# Patient Record
Sex: Female | Born: 1965
Health system: Southern US, Community
[De-identification: ages and names within clinical notes are randomized; demographics above are authoritative.]

## PROBLEM LIST (undated history)

## (undated) DIAGNOSIS — M549 Dorsalgia, unspecified: Secondary | ICD-10-CM

## (undated) DIAGNOSIS — F329 Major depressive disorder, single episode, unspecified: Secondary | ICD-10-CM

## (undated) DIAGNOSIS — J302 Other seasonal allergic rhinitis: Secondary | ICD-10-CM

## (undated) DIAGNOSIS — J329 Chronic sinusitis, unspecified: Secondary | ICD-10-CM

## (undated) DIAGNOSIS — Z8659 Personal history of other mental and behavioral disorders: Secondary | ICD-10-CM

## (undated) DIAGNOSIS — I10 Essential (primary) hypertension: Secondary | ICD-10-CM

## (undated) DIAGNOSIS — M542 Cervicalgia: Secondary | ICD-10-CM

## (undated) DIAGNOSIS — F32A Depression, unspecified: Secondary | ICD-10-CM

## (undated) DIAGNOSIS — F419 Anxiety disorder, unspecified: Secondary | ICD-10-CM

## (undated) DIAGNOSIS — B977 Papillomavirus as the cause of diseases classified elsewhere: Secondary | ICD-10-CM

## (undated) DIAGNOSIS — K219 Gastro-esophageal reflux disease without esophagitis: Secondary | ICD-10-CM

## (undated) HISTORY — PX: ABDOMINAL HYSTERECTOMY: SHX81

## (undated) HISTORY — DX: Other seasonal allergic rhinitis: J30.2

## (undated) HISTORY — DX: Papillomavirus as the cause of diseases classified elsewhere: B97.7

## (undated) HISTORY — PX: BREAST EXCISIONAL BIOPSY: SUR124

## (undated) HISTORY — DX: Cervicalgia: M54.2

## (undated) HISTORY — DX: Dorsalgia, unspecified: M54.9

## (undated) HISTORY — PX: SHOULDER ARTHROSCOPY: SHX128

## (undated) HISTORY — PX: ANTERIOR CRUCIATE LIGAMENT REPAIR: SHX115

## (undated) HISTORY — PX: BREAST SURGERY: SHX581

## (undated) HISTORY — DX: Personal history of other mental and behavioral disorders: Z86.59

---

## 1997-08-05 ENCOUNTER — Encounter: Admission: RE | Admit: 1997-08-05 | Discharge: 1997-11-03 | Payer: Self-pay | Admitting: Orthopedic Surgery

## 1997-11-17 ENCOUNTER — Emergency Department (HOSPITAL_COMMUNITY): Admission: EM | Admit: 1997-11-17 | Discharge: 1997-11-17 | Payer: Self-pay | Admitting: Emergency Medicine

## 1998-10-25 ENCOUNTER — Emergency Department (HOSPITAL_COMMUNITY): Admission: EM | Admit: 1998-10-25 | Discharge: 1998-10-25 | Payer: Self-pay | Admitting: Emergency Medicine

## 1998-10-25 ENCOUNTER — Encounter: Payer: Self-pay | Admitting: Emergency Medicine

## 1999-04-12 ENCOUNTER — Inpatient Hospital Stay (HOSPITAL_COMMUNITY): Admission: AD | Admit: 1999-04-12 | Discharge: 1999-04-12 | Payer: Self-pay | Admitting: *Deleted

## 1999-05-30 ENCOUNTER — Emergency Department (HOSPITAL_COMMUNITY): Admission: EM | Admit: 1999-05-30 | Discharge: 1999-05-30 | Payer: Self-pay

## 1999-07-29 ENCOUNTER — Emergency Department (HOSPITAL_COMMUNITY): Admission: EM | Admit: 1999-07-29 | Discharge: 1999-07-29 | Payer: Self-pay | Admitting: Emergency Medicine

## 2000-02-16 ENCOUNTER — Encounter (INDEPENDENT_AMBULATORY_CARE_PROVIDER_SITE_OTHER): Payer: Self-pay | Admitting: Specialist

## 2000-02-16 ENCOUNTER — Inpatient Hospital Stay (HOSPITAL_COMMUNITY): Admission: RE | Admit: 2000-02-16 | Discharge: 2000-02-19 | Payer: Self-pay | Admitting: Obstetrics and Gynecology

## 2000-02-19 ENCOUNTER — Observation Stay (HOSPITAL_COMMUNITY): Admission: AD | Admit: 2000-02-19 | Discharge: 2000-02-20 | Payer: Self-pay | Admitting: Pediatrics

## 2001-03-30 ENCOUNTER — Emergency Department (HOSPITAL_COMMUNITY): Admission: EM | Admit: 2001-03-30 | Discharge: 2001-03-30 | Payer: Self-pay | Admitting: Emergency Medicine

## 2001-11-27 ENCOUNTER — Encounter: Admission: RE | Admit: 2001-11-27 | Discharge: 2001-11-27 | Payer: Self-pay | Admitting: Internal Medicine

## 2001-11-27 ENCOUNTER — Encounter: Payer: Self-pay | Admitting: Internal Medicine

## 2001-12-25 ENCOUNTER — Encounter: Payer: Self-pay | Admitting: Emergency Medicine

## 2001-12-25 ENCOUNTER — Emergency Department (HOSPITAL_COMMUNITY): Admission: EM | Admit: 2001-12-25 | Discharge: 2001-12-25 | Payer: Self-pay | Admitting: Emergency Medicine

## 2002-01-30 ENCOUNTER — Encounter: Payer: Self-pay | Admitting: Internal Medicine

## 2002-01-30 ENCOUNTER — Encounter: Admission: RE | Admit: 2002-01-30 | Discharge: 2002-01-30 | Payer: Self-pay | Admitting: Internal Medicine

## 2002-02-05 ENCOUNTER — Encounter: Admission: RE | Admit: 2002-02-05 | Discharge: 2002-02-05 | Payer: Self-pay | Admitting: Internal Medicine

## 2002-02-05 ENCOUNTER — Encounter: Payer: Self-pay | Admitting: Internal Medicine

## 2002-03-14 ENCOUNTER — Emergency Department (HOSPITAL_COMMUNITY): Admission: EM | Admit: 2002-03-14 | Discharge: 2002-03-14 | Payer: Self-pay | Admitting: Emergency Medicine

## 2002-03-14 ENCOUNTER — Encounter: Payer: Self-pay | Admitting: Emergency Medicine

## 2002-06-02 ENCOUNTER — Encounter: Admission: RE | Admit: 2002-06-02 | Discharge: 2002-06-02 | Payer: Self-pay | Admitting: Gastroenterology

## 2002-06-02 ENCOUNTER — Encounter: Payer: Self-pay | Admitting: Gastroenterology

## 2002-06-11 ENCOUNTER — Encounter (INDEPENDENT_AMBULATORY_CARE_PROVIDER_SITE_OTHER): Payer: Self-pay | Admitting: Specialist

## 2002-06-11 ENCOUNTER — Ambulatory Visit (HOSPITAL_COMMUNITY): Admission: RE | Admit: 2002-06-11 | Discharge: 2002-06-11 | Payer: Self-pay | Admitting: Gastroenterology

## 2002-07-18 ENCOUNTER — Encounter: Payer: Self-pay | Admitting: Emergency Medicine

## 2002-07-18 ENCOUNTER — Emergency Department (HOSPITAL_COMMUNITY): Admission: EM | Admit: 2002-07-18 | Discharge: 2002-07-18 | Payer: Self-pay | Admitting: Emergency Medicine

## 2002-10-02 ENCOUNTER — Encounter: Admission: RE | Admit: 2002-10-02 | Discharge: 2002-10-02 | Payer: Self-pay | Admitting: Family Medicine

## 2003-09-28 ENCOUNTER — Encounter: Admission: RE | Admit: 2003-09-28 | Discharge: 2003-09-28 | Payer: Self-pay | Admitting: Gastroenterology

## 2003-10-21 ENCOUNTER — Emergency Department (HOSPITAL_COMMUNITY): Admission: EM | Admit: 2003-10-21 | Discharge: 2003-10-21 | Payer: Self-pay | Admitting: Emergency Medicine

## 2003-10-25 ENCOUNTER — Emergency Department (HOSPITAL_COMMUNITY): Admission: EM | Admit: 2003-10-25 | Discharge: 2003-10-25 | Payer: Self-pay | Admitting: Emergency Medicine

## 2003-12-25 ENCOUNTER — Encounter: Admission: RE | Admit: 2003-12-25 | Discharge: 2003-12-25 | Payer: Self-pay | Admitting: Sports Medicine

## 2004-02-09 ENCOUNTER — Encounter: Admission: RE | Admit: 2004-02-09 | Discharge: 2004-04-14 | Payer: Self-pay | Admitting: Orthopedic Surgery

## 2004-12-12 ENCOUNTER — Encounter: Admission: RE | Admit: 2004-12-12 | Discharge: 2004-12-12 | Payer: Self-pay | Admitting: Sports Medicine

## 2005-01-13 ENCOUNTER — Encounter: Admission: RE | Admit: 2005-01-13 | Discharge: 2005-01-13 | Payer: Self-pay | Admitting: Family Medicine

## 2005-01-25 ENCOUNTER — Encounter: Admission: RE | Admit: 2005-01-25 | Discharge: 2005-01-25 | Payer: Self-pay | Admitting: Family Medicine

## 2005-06-08 ENCOUNTER — Emergency Department (HOSPITAL_COMMUNITY): Admission: EM | Admit: 2005-06-08 | Discharge: 2005-06-08 | Payer: Self-pay | Admitting: Emergency Medicine

## 2005-07-03 ENCOUNTER — Encounter: Admission: RE | Admit: 2005-07-03 | Discharge: 2005-07-03 | Payer: Self-pay | Admitting: Family Medicine

## 2006-01-02 ENCOUNTER — Encounter: Admission: RE | Admit: 2006-01-02 | Discharge: 2006-01-02 | Payer: Self-pay | Admitting: Internal Medicine

## 2006-03-09 ENCOUNTER — Emergency Department (HOSPITAL_COMMUNITY): Admission: EM | Admit: 2006-03-09 | Discharge: 2006-03-09 | Payer: Self-pay | Admitting: Emergency Medicine

## 2006-03-18 ENCOUNTER — Emergency Department (HOSPITAL_COMMUNITY): Admission: EM | Admit: 2006-03-18 | Discharge: 2006-03-18 | Payer: Self-pay | Admitting: Emergency Medicine

## 2007-01-04 ENCOUNTER — Encounter: Admission: RE | Admit: 2007-01-04 | Discharge: 2007-01-04 | Payer: Self-pay | Admitting: Internal Medicine

## 2008-01-30 ENCOUNTER — Encounter: Admission: RE | Admit: 2008-01-30 | Discharge: 2008-01-30 | Payer: Self-pay | Admitting: Internal Medicine

## 2008-02-18 ENCOUNTER — Encounter: Admission: RE | Admit: 2008-02-18 | Discharge: 2008-02-18 | Payer: Self-pay | Admitting: Internal Medicine

## 2009-01-15 ENCOUNTER — Encounter: Admission: RE | Admit: 2009-01-15 | Discharge: 2009-01-15 | Payer: Self-pay | Admitting: Sports Medicine

## 2009-01-29 ENCOUNTER — Encounter: Admission: RE | Admit: 2009-01-29 | Discharge: 2009-01-29 | Payer: Self-pay | Admitting: Sports Medicine

## 2009-02-01 ENCOUNTER — Encounter: Admission: RE | Admit: 2009-02-01 | Discharge: 2009-02-01 | Payer: Self-pay | Admitting: Family Medicine

## 2009-02-12 ENCOUNTER — Encounter: Admission: RE | Admit: 2009-02-12 | Discharge: 2009-02-12 | Payer: Self-pay | Admitting: Sports Medicine

## 2010-08-08 ENCOUNTER — Other Ambulatory Visit: Payer: Self-pay | Admitting: Neurological Surgery

## 2010-08-08 DIAGNOSIS — M542 Cervicalgia: Secondary | ICD-10-CM

## 2010-08-12 ENCOUNTER — Ambulatory Visit
Admission: RE | Admit: 2010-08-12 | Discharge: 2010-08-12 | Disposition: A | Discharge status: Home / Self Care | Payer: Private Health Insurance - Indemnity | Source: Ambulatory Visit | Attending: Neurological Surgery | Admitting: Neurological Surgery

## 2010-08-12 DIAGNOSIS — M542 Cervicalgia: Secondary | ICD-10-CM

## 2010-08-19 NOTE — H&P (Signed)
[place]  Patient:    Catherine Pope, Catherine Pope                  MRN: [id number] Adm. Date:  [id number] Attending:  Jerald Kief                         History and Physical  HISTORY OF PRESENT ILLNESS:  Catherine Pope is a 45 year old black female status post total abdominal hysterectomy with Burch retropubic urethropexy which was performed on February 16, 2000.  She was discharged from the hospital earlier this morning.  This afternoon, she called because she was unable to urinate and could not get the catheter to work; furthermore, complained of he art beating loud and occasional chills.  Also reported a fever at home to 100.3.  In triage when she was evaluated, she was complaining of pressure and tachycardia and fever.  She had a fever of 101.3, a pulse of 97, blood pressure 131/88.  The catheter was flushed and was drained easily.  The patient stated she did not feel well and wanted to be readmitted.  The patient was placed on the floor for a 23-hour observation.  Upon placement on the floor, a CBC was ordered which showed an essentially normal white count of 10.8, hemoglobin of 11.9, and platelets of 335.  Urinalysis is pending.  On the floor of the hospital, her temperature was 99.8, pulse of 74, and her normal blood pressure.  PHYSICAL EXAMINATION:  HEART:  Regular rate and rhythm.  Lungs are clear to auscultation bilaterally.  ABDOMEN:  Nondistended, nontender.  Bowel sounds are positive.  The incision is clean, dry, and intact, and the catheter site flushes easily and appears to be draining well.  IMPRESSION AND PLAN:  Postoperative day number three, status post total abdominal hysterectomy and Burch retropubic urethropexy.  Patient reports fever at home and had fever upon presentation to triage; however, has remained afebrile and normal white count since then.  The patient reportedly is feeling much better at this time, and the  catheter is draining normally.  Will plan observation through the night since I do not have a source infection.  If she appears to do well through the night will consider discharge home in the morning.DD:  02/19/00 TD:  02/19/00 Job: [id number]

## 2010-08-19 NOTE — Discharge Summary (Signed)
[place]  Patient:    Catherine Pope, Catherine Pope                  MRN: [id number] Adm. Date:  [id number] Disc. Date: 02/17/00 Attending:  Jerald Kief                           Discharge Summary  HISTORY OF PRESENT ILLNESS:   Ms. Alloway is a 45 year old black female status post total abdominal hysterectomy and Burch retropubic urethropexy on February 16, 2000, who was discharged yesterday and returned yesterday feeling ill, tachycardia, and catheter was not working.  The patient desired readmission.  She had a fever in triage of 101.3.  HOSPITAL COURSE:              The patient was admitted to the floor, never had any significant temperature elevation during her observation status.  Complete blood count with differential was within normal limits.  Urinalysis was also normal.  The patient feels like she was relatively dehydrated and after pushing oral fluids throughout the night, the patient desired discharge home the following morning.  By the time of discharge, the patient was tolerating a regular diet, ambulating without difficulty, voiding normal amounts with no residual in the suprapubic catheter.  The staples were removed.  The suprapubic catheter was removed without difficulty, and the patient was discharged home.  DISPOSITION:                  The patient will be discharged home and will follow up in the office in 2-3 weeks.  She is sent home with a prescription for Darvocet #30. DD:  02/20/00 TD:  02/20/00 Job: [id number]

## 2010-08-19 NOTE — Discharge Summary (Signed)
[facility]  Patient:    SINA, HOSEA                  MRN: [id number] Adm. Date:  [id number] Disc. Date: 02/19/00 Attending:  Jerald Kief                           Discharge Summary  HISTORY OF PRESENT ILLNESS:   Ms. Cavitt is a 45 year old G2, P2 with worsening menomenorrhagia, pelvic pain, dyspareunia not responsive to conservative medical management and anti-inflammatory agents, oral contraceptive agents, or D&C, also with stress urinary incontinence.  Presents for TAH and birch.  See history and physical for further details.  HOSPITAL COURSE:              Patient was admitted same day of surgery.  She underwent a total abdominal hysterectomy, birch retropubic urethropexy.  The surgery was uncomplicated.  Estimated blood loss was 200 cc.  Her postoperative course was relatively uncomplicated with good return of bowel function and ambulation.  She remained afebrile throughout the hospital course.  Her postoperative hemoglobin was 11.1 on postoperative day #1 and by postoperative day #3 she was tolerating a regular diet, ambulating without difficulty, passing flatus.  Attempted bladder training was relatively successful with voids at 100-200 cc per time with minimal to no postvoid residual.  DISPOSITION:                  Patient will be discharged home with follow-up in the office in two days for removal of the staples and also for removal of the catheter.  She is sent home with a prescription for Tylox #30 and on light activity.  DISCHARGE CONDITION:          Good. DD:  02/19/00 TD:  02/19/00 Job: [id number]

## 2010-08-19 NOTE — Op Note (Signed)
NAME:  Catherine Pope, Catherine Pope                     ACCOUNT NO.:  [id number]   MEDICAL RECORD NO.:  [id number]                   PATIENT TYPE:  AMB   LOCATION:  ENDO                                 FACILITY:  [facility]   PHYSICIAN:  Nelwyn Salisbury, M.D.               DATE OF BIRTH:  01/27/1966   DATE OF PROCEDURE:  06/11/2002  DATE OF DISCHARGE:                                 OPERATIVE REPORT   PROCEDURE PERFORMED:  Colonoscopy with cold biopsy x 2.   ENDOSCOPIST:  Nelwyn Salisbury, M.D.   INSTRUMENT USED:  Olympus video colonoscope   INDICATIONS FOR PROCEDURE:  A 45 year old African-American female with a  history of rectal bleeding and abdominal pain.  Rule out colonic polyps,  masses, etc.   PREPROCEDURE PREPARATION:  Informed consent was procured from the patient  and the patient was fasted for eight hours prior to the procedure and  prepped with a bottle of magnesium citrate and a gallon of GoLYTELY the  night prior to the procedure.   PREPROCEDURE PHYSICAL:  VITAL SIGNS:  The patient had stable vital signs.  NECK:  Supple.  CHEST:  Clear to auscultation.  S1 and S2 regular.  ABDOMEN:  Soft with normal bowel sounds.   DESCRIPTION OF PROCEDURE:  The patient was placed in the left lateral  decubitus position and sedated with 100 mg of Demerol and 10 mg of Versed  intravenously.  Once the patient was adequately sedated and maintained on  low flow oxygen and continuous cardiac monitoring, the Olympus video  colonoscope was advanced from the rectum into the cecum without difficulty.  The patient had a fairly good prep.  No masses, polyps, erosions,  ulcerations or diverticula were seen.  A small sessile polyp was biopsied  from the cecum.  Small nonbleeding internal hemorrhoids were seen on  retroflexion.  The patient tolerated the procedure well without  complications.   IMPRESSION:  1. Small nonbleeding internal hemorrhoids.  2. Small sessile polyp biopsied from the cecum.  3. Normal appearing retrosigmoid, left colon, transverse colon, right colon     and terminal ileum.   RECOMMENDATIONS:  1. Await pathology results.  2.     Anusol AC 2.5% suppositories one p.o. q.h.s. This has been called in to her      pharmacy for the next month.  3. High fiber diet and liberal fluid intake.  4. Outpatient follow up in the next two weeks for further recommendations.                                               Nelwyn Salisbury, M.D.    JNM/MEDQ  D:  06/11/2002  T:  06/12/2002  Job:  [id number]   cc:   Gwenlyn Perking, M.D.  [address]  [place]  [place] [address]  Fax: [phone]

## 2010-08-19 NOTE — Op Note (Signed)
[facility]  Patient:    Catherine Pope, Catherine Pope                  MRN: [id number] Proc. Date: 02/16/00 Adm. Date:  [id number] Attending:  Jerald Kief                           Operative Report  PREOPERATIVE DIAGNOSIS:  Metromenorrhagia, probable fibroids, pelvic pain, dyspareunia and stress urinary incontinence.  POSTOPERATIVE DIAGNOSIS:  Metromenorrhagia, probable fibroids, pelvic pain, dyspareunia and stress urinary incontinence.  OPERATION:  Total abdominal hysterectomy and Burch retropubic urethropexy.  SURGEON:  Jerald Kief, M.D.  ASSISTANT:  Maisie Fus, M.D.  ANESTHESIA:  General endotracheal.  ESTIMATED BLOOD LOSS:  200 cc.  INDICATIONS:  Catherine Pope is a 45 year old G2, P2 with worsening metromenorrhagia, pelvic pain and dyspareunia not responsive to conservative medical management including anti-inflammatory medications, oral contraceptive agents.  The patient has had three D&Cs in the past for metromenorrhagia.  She also has stress urinary incontinence which is a problem for her. She desires definitive surgical intervention and presents today for total abdominal hysterectomy with Burch retropubic urethropexy.  Different types of bladder procedures were discussed with the patient at length.  The patient desires Burch retropubic urethropexy.  The risks and benefits of all the above procedures were discussed with the patient at length including but not limited to risk of infection, bleeding, damage to bowel, bladder, ureters, inability to alleviate urinary incontinence or overcorrection where the patient requires prolonged self-catheterization.  Success rates were discussed at length with the patient.  Risks of blood transfusion were discussed at length with the patient and she gives her informed consent.  OPERATIVE FINDINGS:  Approximately six to eight-week size fibroid uterus with several small fibroids,  normal-appearing tubes and ovaries.  Appendix is normal in appearance.  DESCRIPTION OF PROCEDURE:  After adequate anesthesia, the patient was placed in the frogleg position.  She was sterilely prepped and draped.  The Foley catheter was sterilely placed.  The previous Pfannenstiel skin incision was sharply excised.  The incision was taken down sharply to the fascia which was incised transversely and then superiorly ____________ belly of the rectus muscle.  A moderate amount of scar tissue was encountered.  This was sharply dissected.  Hemostasis was achieved with the Bovie cautery.  The peritoneum was entered sharply, extended superiorly and inferiorly.  The OSullivan-OConnor retractor was placed.  Bowel was packed cephalad.  Some omental adhesions were dissected off the anterior peritoneum.  Good hemostasis was achieved.  Kelly clamps were placed across the uterine ovarian ligaments bilaterally.  The round ligaments were identified bilaterally and suture ligated with a 0 Monocryl suture was used throughout the remainder of the procedure unless otherwise specified.  Windows were made in the broad ligaments and Heaney clamps were placed across the utero-ovarian ligaments bilaterally.  They were clamped, cut and tied, doubly ligated, again with 0 Monocryl suture.  The bladder was dissected off the anterior surface of the cervix.  Heaney clamps were placed at the level of the internal os across the uterine vasculature bilaterally, clamped, cut and tied.  Again the cervix was dissected off the anterior surface of cervix and then successive clamps with Heaney clamps down to cardinal ligaments down to uterosacral ligaments, clamped cut and tied until the uterosacral ligaments were encountered.  Heaney clamps were placed across the uterosacral ligaments, entering the vagina, again clamped, cut and tied at  this time.  The uterus and cervix were removed intact.  Examination revealed the entire  cervix was removed.  The vagina was then closed in a horizontal fashion with 0 Monocryl suture in figure-of-eights with good approximation and good hemostasis.  After hemostasis was assured, the uterosacral ligaments were plicated in the midline.  After a copious amount of irrigation, adequate hemostasis, the utero-ovarian pedicles were ligated to the round ligaments for suspension of the ovaries.  Re-examination of the cul-de-sac revealed good hemostasis.  At this time the peritoneum was closed with a 2-0 Vicryl and hemostasis was achieved.  Bowel packing was removed before the closure of the peritoneum.  At this time the OSullivan-OConnor retractor was replaced retracting the rectus muscles laterally.  The operators left hand was placed in the vagina pulling caudad on the Foley catheter.  The UV angle was identified and a suture of 0 Ethibond was placed in a pulley fashion through the vaginal mucosa approximately 1 cm inferior, 1 cm to the right side of the UV angle. It was then placed through the Coopers ligaments on the right side of the pubic bone. The left side of the UV angle was identified in similar fashion and 1 cm lateral and 1 cm inferior to the bladder.  A suture of 0 Ethibond was placed in a pulley fashion and ligated through the Coopers ligaments on the left side.  At this time the sutures were tied with good support of the UV angle noted.  Because of the patients size and difficulty achieving good suture placement at this time and good support noted, it was felt that only one suture was necessary on each side for good support.  After adequate hemostasis was assured, the bladlder was filled with sterile milk, no evidence of leaking was noted.  A Bonanno superpubic catheter was placed under direct visualization.  A 30 cc Foley catheter balloon was noted to pop upon insertion with good return of sterile milk and urine through the [facility] catheter.  It was suture ligated to the  skin with 0 silk.  At this time irrigation was applied.  After adequate hemostasis was assured, the fascia was closed with 0 Panacryl with two sutures of 0 Panacryl with approximation, good hemostasis.  After meticulous  hemostasis, Campers fascia was approximated with 2-0 Vicryl suture.  The skin was then stapled and Steri-Strips applied.  The patient tolerated the procedure well and was stable on transfer to recovery room.  Sponge and instrument count was normal x 3.  Estimated blood loss for this procedure was 200 cc.  The patient received 1 gm of Cefotetan preoperatively. DD:  02/16/00 TD:  02/16/00 Job: [id number]

## 2010-08-19 NOTE — H&P (Signed)
[place]  Patient:    Catherine Pope, Catherine Pope                  MRN: [id number] Adm. Date:  [id number] Attending:  Jerald Kief                         History and Physical  HISTORY OF PRESENT ILLNESS:   Ms. Fizer is a 45 year old, black female, G2, P2, who presents for surgical treatment of ongoing pelvic pain and abnormal uterine bleeding with ultrasound showing leiomyomata.  The patient also has stress urinary incontinence and urge incontinence type symptoms.  Reporting pelvic pain for the last several months, which several months ago prompted her to be elevated by [facility].  They performed an ultrasound which showed a slightly enlarged uterus, several small fibroids, and normal-appearing ovaries.  She has not responded to medication.  She does have a dull pain on a daily basis.  She also describes pressure sensation on the back of the bladder and also dyspareunia.  The dyspareunia and pelvic pressure have been there for the last number of years.  She also reports menorrhagia for the last six to seven month, very heavy.  This has been for the last six to eight years, which has not responded to oral contraceptive agents.  She has had three D&Cs in early 1994 for abnormal uterine bleeding.  She also underwent a laparoscopy in 1994 for pelvic pain and was told that everything was normal.  She describes daily urinary incontinence with stress and also some urge symptoms which have improved with Detrol.  She desires definitive surgical intervention.  She requests hysterectomy.  She also requests bladder surgery and plans on Burch retropubic urethropexy.  PAST MEDICAL HISTORY:         Negative.  PAST SURGICAL HISTORY:        She had a cesarean section in 1994 and a vaginal delivery in 1992.  She has had three D&Cs in 1994 and also a laparoscopy in 1994 for pelvic pain.  MEDICATIONS:                  Currently none other than anti-inflammatory  meds as needed.  ALLERGIES:                    She has no known drug allergies.  PHYSICAL EXAMINATION:         The blood pressure is 124/72.  WEIGHT:                       229 pounds.  HEART:                        Regular rate and rhythm.  LUNGS:                        Clear to auscultation bilaterally.  ABDOMEN:                      Nondistended and nontender.  It is obese.  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  The uterus is 6-8 weeks size.  No adnexal masses are palpable.  IMPRESSION:  1. Menometrorrhagia.                               2. Pelvic pain.                               3. Dyspareunia.                               4. Stress urinary incontinence not responsive to                                  conservative medical management.  PLAN:                         The patient was offered a myriad of treatments. Because she has not responded to conservative medical treatments and the patient has had a bilateral tubal ligation, she desires definitive surgery. She talked about different routes of surgery for bladder repair and success rates, including anterior colporrhaphy versus Burch retropubic urethropexy versus sling procedure versus no procedure at all.  The patient desires the abdominal approach and requests Burch retropubic urethropexy.  The risks and benefits of the procedure were discussed at length, including, but no limited to risks of infection, bleeding, damage to bowel, bladder, and ureters, the possibility of not able to correct urinary incontinence or overcorrection with prolonged catheter wear or self-catheterization, the risks associated with blood transfusion, including the risk of infection, such hepatitis or AIDS, and also reaction to the blood transfusion.  The patient does give her informed consent. DD:  02/16/00 TD:  02/16/00 Job: [id number]

## 2010-08-20 ENCOUNTER — Other Ambulatory Visit: Payer: Self-pay | Admitting: Neurological Surgery

## 2010-08-20 DIAGNOSIS — M542 Cervicalgia: Secondary | ICD-10-CM

## 2010-08-21 ENCOUNTER — Inpatient Hospital Stay: Admission: RE | Admit: 2010-08-21 | Payer: Private Health Insurance - Indemnity | Source: Ambulatory Visit

## 2010-08-21 ENCOUNTER — Ambulatory Visit
Admission: RE | Admit: 2010-08-21 | Discharge: 2010-08-21 | Disposition: A | Discharge status: Home / Self Care | Payer: Private Health Insurance - Indemnity | Source: Ambulatory Visit | Attending: Neurological Surgery | Admitting: Neurological Surgery

## 2010-08-21 DIAGNOSIS — M542 Cervicalgia: Secondary | ICD-10-CM

## 2010-08-22 ENCOUNTER — Other Ambulatory Visit: Payer: Self-pay | Admitting: Neurological Surgery

## 2010-08-22 DIAGNOSIS — M542 Cervicalgia: Secondary | ICD-10-CM

## 2010-09-13 ENCOUNTER — Ambulatory Visit
Admission: RE | Admit: 2010-09-13 | Discharge: 2010-09-13 | Disposition: A | Discharge status: Home / Self Care | Payer: Private Health Insurance - Indemnity | Source: Ambulatory Visit | Attending: Neurological Surgery | Admitting: Neurological Surgery

## 2010-09-13 DIAGNOSIS — M542 Cervicalgia: Secondary | ICD-10-CM

## 2010-09-20 ENCOUNTER — Other Ambulatory Visit: Payer: Self-pay | Admitting: Neurological Surgery

## 2010-09-20 DIAGNOSIS — M542 Cervicalgia: Secondary | ICD-10-CM

## 2010-09-20 DIAGNOSIS — M79601 Pain in right arm: Secondary | ICD-10-CM

## 2010-09-22 ENCOUNTER — Ambulatory Visit
Admission: RE | Admit: 2010-09-22 | Discharge: 2010-09-22 | Disposition: A | Discharge status: Home / Self Care | Payer: Private Health Insurance - Indemnity | Source: Ambulatory Visit | Attending: Neurological Surgery | Admitting: Neurological Surgery

## 2010-09-22 DIAGNOSIS — M542 Cervicalgia: Secondary | ICD-10-CM

## 2010-09-22 DIAGNOSIS — M79601 Pain in right arm: Secondary | ICD-10-CM

## 2011-03-13 ENCOUNTER — Emergency Department (HOSPITAL_COMMUNITY)
Admission: EM | Admit: 2011-03-13 | Discharge: 2011-03-13 | Disposition: A | Discharge status: Home / Self Care | Payer: Private Health Insurance - Indemnity | Attending: Emergency Medicine | Admitting: Emergency Medicine

## 2011-03-13 ENCOUNTER — Encounter: Payer: Self-pay | Admitting: Emergency Medicine

## 2011-03-13 DIAGNOSIS — I10 Essential (primary) hypertension: Secondary | ICD-10-CM | POA: Insufficient documentation

## 2011-03-13 DIAGNOSIS — E119 Type 2 diabetes mellitus without complications: Secondary | ICD-10-CM | POA: Insufficient documentation

## 2011-03-13 DIAGNOSIS — R739 Hyperglycemia, unspecified: Secondary | ICD-10-CM

## 2011-03-13 DIAGNOSIS — F17200 Nicotine dependence, unspecified, uncomplicated: Secondary | ICD-10-CM | POA: Insufficient documentation

## 2011-03-13 HISTORY — DX: Essential (primary) hypertension: I10

## 2011-03-13 LAB — URINALYSIS, ROUTINE W REFLEX MICROSCOPIC
Bilirubin Urine: NEGATIVE
Glucose, UA: >1000 mg/dL — AB
Hgb urine dipstick: NEGATIVE
Ketones, ur: >80 mg/dL — AB
Leukocytes, UA: NEGATIVE
Nitrite: NEGATIVE
Protein, ur: 30 mg/dL — AB
Specific Gravity, Urine: 1.046 — ABNORMAL HIGH (ref 1.005–1.030)
Urobilinogen, UA: 0.2 mg/dL (ref 0.0–1.0)
pH: 5.5 (ref 5.0–8.0)

## 2011-03-13 LAB — CBC
HCT: 43.4 % (ref 36.0–46.0)
Hemoglobin: 15.6 g/dL — ABNORMAL HIGH (ref 12.0–15.0)
MCH: 30.8 pg (ref 26.0–34.0)
MCHC: 35.9 g/dL (ref 30.0–36.0)
MCV: 85.6 fL (ref 78.0–100.0)
Platelets: 334 10*3/uL (ref 150–400)
RBC: 5.07 MIL/uL (ref 3.87–5.11)
RDW: 13 % (ref 11.5–15.5)
WBC: 15.7 10*3/uL — ABNORMAL HIGH (ref 4.0–10.5)

## 2011-03-13 LAB — URINE MICROSCOPIC-ADD ON

## 2011-03-13 LAB — BLOOD GAS, VENOUS
Acid-Base Excess: 0.6 mmol/L (ref 0.0–2.0)
Bicarbonate: 25.4 mEq/L — ABNORMAL HIGH (ref 20.0–24.0)
O2 Saturation: 68.3 %
Patient temperature: 98.6
TCO2: 22.1 mmol/L (ref 0–100)
pCO2, Ven: 43.3 mmHg — ABNORMAL LOW (ref 45.0–50.0)
pH, Ven: 7.387 — ABNORMAL HIGH (ref 7.250–7.300)
pO2, Ven: 38.5 mmHg (ref 30.0–45.0)

## 2011-03-13 LAB — DIFFERENTIAL
Basophils Absolute: 0 10*3/uL (ref 0.0–0.1)
Basophils Relative: 0 % (ref 0–1)
Eosinophils Absolute: 0.2 10*3/uL (ref 0.0–0.7)
Eosinophils Relative: 1 % (ref 0–5)
Lymphocytes Relative: 32 % (ref 12–46)
Lymphs Abs: 5 10*3/uL — ABNORMAL HIGH (ref 0.7–4.0)
Monocytes Absolute: 0.8 10*3/uL (ref 0.1–1.0)
Monocytes Relative: 5 % (ref 3–12)
Neutro Abs: 9.7 10*3/uL — ABNORMAL HIGH (ref 1.7–7.7)
Neutrophils Relative %: 62 % (ref 43–77)

## 2011-03-13 LAB — COMPREHENSIVE METABOLIC PANEL
ALT: 50 U/L — ABNORMAL HIGH (ref 0–35)
AST: 37 U/L (ref 0–37)
Albumin: 4.4 g/dL (ref 3.5–5.2)
Alkaline Phosphatase: 75 U/L (ref 39–117)
BUN: 9 mg/dL (ref 6–23)
CO2: 21 mEq/L (ref 19–32)
Calcium: 10.3 mg/dL (ref 8.4–10.5)
Chloride: 91 mEq/L — ABNORMAL LOW (ref 96–112)
Creatinine, Ser: 0.54 mg/dL (ref 0.50–1.10)
GFR calc Af Amer: >90 mL/min (ref >90)
GFR calc non Af Amer: >90 mL/min (ref >90)
Glucose, Bld: 397 mg/dL — ABNORMAL HIGH (ref 70–99)
Potassium: 3.9 mEq/L (ref 3.5–5.1)
Sodium: 129 mEq/L — ABNORMAL LOW (ref 135–145)
Total Bilirubin: 0.4 mg/dL (ref 0.3–1.2)
Total Protein: 8.6 g/dL — ABNORMAL HIGH (ref 6.0–8.3)

## 2011-03-13 LAB — GLUCOSE, CAPILLARY
Glucose-Capillary: 447 mg/dL — ABNORMAL HIGH (ref 70–99)
Glucose-Capillary: 354 mg/dL — ABNORMAL HIGH (ref 70–99)
Glucose-Capillary: 393 mg/dL — ABNORMAL HIGH (ref 70–99)

## 2011-03-13 MED ORDER — SODIUM CHLORIDE 0.9 % IV BOLUS (SEPSIS)
1000.0000 mL | Freq: Once | INTRAVENOUS | Status: AC
  Administered 2011-03-13: 1000 mL via INTRAVENOUS

## 2011-03-13 MED ORDER — INSULIN REGULAR HUMAN 100 UNIT/ML IJ SOLN
10.0000 [IU] | Freq: Once | INTRAMUSCULAR | Status: AC
  Administered 2011-03-13: 10 [IU] via INTRAVENOUS
  Filled 2011-03-13 (×2): qty 0.1

## 2011-03-13 NOTE — ED Notes (Signed)
Pt aware of the need for a urine sample. 

## 2011-03-13 NOTE — ED Notes (Signed)
Pt states has recently finished antibiotics for cold s/s. Pt states thurs started having blurred vision and weakness. Pt states not getting any better. Pt states did not take any medications today.

## 2011-03-13 NOTE — ED Notes (Signed)
CBG registered 354 on ED Glucometer.

## 2011-03-13 NOTE — ED Notes (Signed)
Pt care assumed, obtained verbal report.

## 2011-03-13 NOTE — ED Provider Notes (Signed)
History     CSN: [id number] Arrival date & time: 03/13/2011  8:28 AM   First MD Initiated Contact with Patient 03/13/11 [phone]      Chief Complaint  Patient presents with  . Hyperglycemia    (Consider location/radiation/quality/duration/timing/severity/associated sxs/prior treatment) HPI The patient presents with 4 days of hyper glycemia and multiple other complaints. She notes that following completion of a course of prednisone, she has had persistent hyperglycemia, with blood glucose greater than 400. Over the same timeframe she complains of intermittent blurry vision, mild imbalance, generalized fatigue. No relief with anything, patient continues to take her by mouth medications as directed. The patient denies any fever, chills, vomiting, diarrhea, dysuria, abdominal pain Past Medical History  Diagnosis Date  . Diabetes mellitus   . Hypertension     Past Surgical History  Procedure Date  . Abdominal hysterectomy   . Anterior cruciate ligament repair     History reviewed. No pertinent family history.  History  Substance Use Topics  . Smoking status: Current Everyday Smoker -- 0.5 packs/day    Types: Cigarettes  . Smokeless tobacco: Not on file  . Alcohol Use: No    OB History    Grav Para Term Preterm Abortions TAB SAB Ect Mult Living                  Review of Systems  Constitutional:       HPI  HENT:       HPI otherwise negative  Eyes: Negative.   Respiratory:       HPI, otherwise negative  Cardiovascular:       HPI, otherwise nmegative  Gastrointestinal: Negative for vomiting.  Genitourinary:       HPI, otherwise negative  Musculoskeletal:       HPI, otherwise negative  Skin: Negative.   Neurological: Negative for syncope.    Allergies  Review of patient's allergies indicates no known allergies.  Home Medications   Current Outpatient Rx  Name Route Sig Dispense Refill  . OMEGA-3 FATTY ACIDS 1000 MG PO CAPS Oral Take 2 g by mouth daily.        Marland Kitchen HYDROCHLOROTHIAZIDE 12.5 MG PO TABS Oral Take 12.5 mg by mouth daily.      Marland Kitchen METFORMIN HCL 500 MG PO TABS Oral Take 1,000 mg by mouth 2 (two) times daily with a meal. 2 tabs for 1000mg  dose    . MULTI-VITAMIN/MINERALS PO TABS Oral Take 1 tablet by mouth daily.      Marland Kitchen PSEUDOEPHEDRINE HCL 30 MG PO TABS Oral Take 30 mg by mouth every 4 (four) hours as needed. Cold symptoms       BP 149/85  Pulse 101  Temp(Src) 99 F (37.2 C) (Oral)  Resp 16  SpO2 97%  Physical Exam  Nursing note and vitals reviewed. Constitutional: She is oriented to person, place, and time. She appears well-developed and well-nourished.  HENT:  Head: Normocephalic and atraumatic.  Eyes: Conjunctivae, EOM and lids are normal. Pupils are equal, round, and reactive to light. Right conjunctiva has no hemorrhage. Left conjunctiva has no hemorrhage. Right eye exhibits normal extraocular motion and no nystagmus. Left eye exhibits normal extraocular motion and no nystagmus.  Fundoscopic exam:      The right eye shows no hemorrhage and no papilledema.       The left eye shows no hemorrhage and no papilledema.  Cardiovascular: Normal rate and regular rhythm.   Pulmonary/Chest: Effort normal and breath sounds normal.  Abdominal: She  exhibits no distension.  Musculoskeletal: She exhibits no edema and no tenderness.  Neurological: She is alert and oriented to person, place, and time.  Skin: Skin is warm and dry.  Psychiatric: She has a normal mood and affect.    ED Course  Procedures (including critical care time)  Labs Reviewed  GLUCOSE, CAPILLARY - Abnormal; Notable for the following:    Glucose-Capillary 447 (*)    All other components within normal limits  CBC - Abnormal; Notable for the following:    WBC 15.7 (*)    Hemoglobin 15.6 (*)    All other components within normal limits  DIFFERENTIAL  POCT CBG MONITORING  COMPREHENSIVE METABOLIC PANEL  URINALYSIS, ROUTINE W REFLEX MICROSCOPIC  BLOOD GAS, VENOUS    No results found.   No diagnosis found.    MDM  This is a 45 year old female presents with concerns of hyperglycemia and multiple other minor complaints. On exam the patient is in no distress with no objective findings of end organ dysfunction. The patient's blood glucose was initially greater than 400, decreased substantially following IV fluids and insulin. The patient's labs are not remarkably abnormal, and abnormalities are consistent with hyperglycemia. The patient notes that she feels significantly better following ED interventions, and will be discharged to follow up with her primary care physician within the next 3 days. Discussion was held with patient regarding utility of insulin versus continued oral anti-hypoglycemic meds. The patient's recent cessation of steroids as a possible ablation for her new hyperglycemia. Given the absence of a history of persistent hyperglycemia, or other indications for insulin this intervention will not be continued, but the patient will discuss this with her primary care physician        Carmin Muskrat, MD 03/13/11 1205

## 2011-03-13 NOTE — ED Notes (Signed)
Pt reports sinus congestion, sore throat and generally feeling weak and lethargic with blurred vision starting Thursday after completing prednisone and zithromax. Pt reports no improvement to sinus issues. Pt reports blood sugar up to 400 yesterday without changes in diabetic medications. Pt reports not on insulin, but takes oral medications.

## 2011-03-13 NOTE — ED Notes (Signed)
cbg [id number]

## 2011-07-05 ENCOUNTER — Encounter (HOSPITAL_COMMUNITY): Payer: Self-pay

## 2011-07-05 ENCOUNTER — Emergency Department (HOSPITAL_COMMUNITY)
Admission: EM | Admit: 2011-07-05 | Discharge: 2011-07-05 | Disposition: A | Discharge status: Home / Self Care | Payer: Managed Care, Other (non HMO) | Source: Home / Self Care | Attending: Emergency Medicine | Admitting: Emergency Medicine

## 2011-07-05 DIAGNOSIS — G43909 Migraine, unspecified, not intractable, without status migrainosus: Secondary | ICD-10-CM

## 2011-07-05 HISTORY — DX: Chronic sinusitis, unspecified: J32.9

## 2011-07-05 MED ORDER — IBUPROFEN 800 MG PO TABS
ORAL_TABLET | ORAL | Status: AC
  Filled 2011-07-05: qty 1

## 2011-07-05 MED ORDER — IBUPROFEN 600 MG PO TABS: 600.0000 mg | ORAL_TABLET | Freq: Four times a day (QID) | ORAL | Status: AC | PRN

## 2011-07-05 MED ORDER — IBUPROFEN 800 MG PO TABS
800.0000 mg | ORAL_TABLET | Freq: Once | ORAL | Status: AC
  Administered 2011-07-05: 800 mg via ORAL

## 2011-07-05 MED ORDER — SUMATRIPTAN SUCCINATE 100 MG PO TABS: 100.0000 mg | ORAL_TABLET | ORAL | Status: DC | PRN

## 2011-07-05 MED ORDER — DEXAMETHASONE SODIUM PHOSPHATE 10 MG/ML IJ SOLN
INTRAMUSCULAR | Status: AC
Start: 2011-07-05 — End: 2011-07-05
  Filled 2011-07-05: qty 1

## 2011-07-05 MED ORDER — DIPHENHYDRAMINE HCL 25 MG PO CAPS
ORAL_CAPSULE | ORAL | Status: AC
  Filled 2011-07-05: qty 2

## 2011-07-05 MED ORDER — SUMATRIPTAN SUCCINATE 6 MG/0.5ML ~~LOC~~ SOLN
SUBCUTANEOUS | Status: AC
  Filled 2011-07-05: qty 0.5

## 2011-07-05 MED ORDER — BUTALBITAL-APAP-CAFFEINE 50-325-40 MG PO TABS: 1.0000 | ORAL_TABLET | Freq: Four times a day (QID) | ORAL | Status: AC | PRN

## 2011-07-05 MED ORDER — DEXAMETHASONE SODIUM PHOSPHATE 10 MG/ML IJ SOLN
10.0000 mg | Freq: Once | INTRAMUSCULAR | Status: AC
  Administered 2011-07-05: 10 mg via INTRAMUSCULAR

## 2011-07-05 MED ORDER — SUMATRIPTAN SUCCINATE 6 MG/0.5ML ~~LOC~~ SOLN
6.0000 mg | Freq: Once | SUBCUTANEOUS | Status: AC
  Administered 2011-07-05: 6 mg via SUBCUTANEOUS

## 2011-07-05 MED ORDER — DIPHENHYDRAMINE HCL 25 MG PO CAPS
50.0000 mg | ORAL_CAPSULE | Freq: Once | ORAL | Status: AC
  Administered 2011-07-05: 50 mg via ORAL

## 2011-07-05 NOTE — ED Provider Notes (Signed)
History     CSN: [id number]  Arrival date & time 07/05/11  1044   First MD Initiated Contact with Patient 07/05/11 1201      Chief Complaint  Patient presents with  . Headache    (Consider location/radiation/quality/duration/timing/severity/associated sxs/prior treatment) HPI Comments: Pt c/o gradual onset throbbing right sided posterior HA and that has now moved to "frontal, behind her eyes" starting 3 days ago. Patient states HA lasts hours. Better with rest and being in a dark room. Worse with light, noise, head movement.  Reports nausea, No V, nasal congestion, sinus pain/pressure, ear pain,  purulent nasal d/c, dental pain, neck stiffness, rash, dysarthria, focal weakness, facial droop. No sudden onset, did not occur during exertion States is similar to previous migraines. Was started on an unknown anti-inflammatory by her primary care physician w/o relief. Patient also has a history sinusitis, and long history of hypertension, but states that this does not feel like this is a sinus or blood pressure problem. Patient's lisinopril was  increased to 20 mg yesterday, as her blood pressure has been running high for some time.  ROS as noted in HPI. All other ROS negative.   Patient is a 46 y.o. female presenting with migraine. The history is provided by the patient. No language interpreter was used.  Migraine This is a new problem. The current episode started more than 2 days ago. The problem occurs constantly. The problem has not changed since onset.Associated symptoms include headaches.    Past Medical History  Diagnosis Date  . Diabetes mellitus   . Hypertension   . Migraine   . Sinusitis     Past Surgical History  Procedure Date  . Abdominal hysterectomy   . Anterior cruciate ligament repair     History reviewed. No pertinent family history.  History  Substance Use Topics  . Smoking status: Current Everyday Smoker -- 0.5 packs/day    Types: Cigarettes  . Smokeless  tobacco: Not on file  . Alcohol Use: No    OB History    Grav Para Term Preterm Abortions TAB SAB Ect Mult Living                  Review of Systems  Neurological: Positive for headaches.    Allergies  Review of patient's allergies indicates no known allergies.  Home Medications   Current Outpatient Rx  Name Route Sig Dispense Refill  . FLUTICASONE PROPIONATE 50 MCG/ACT NA SUSP Nasal Place 2 sprays into the nose daily.    Marland Kitchen LISINOPRIL 20 MG PO TABS Oral Take 20 mg by mouth daily.    Marland Kitchen BUTALBITAL-APAP-CAFFEINE 50-325-40 MG PO TABS Oral Take 1-2 tablets by mouth every 6 (six) hours as needed for headache. 20 tablet 0  . OMEGA-3 FATTY ACIDS 1000 MG PO CAPS Oral Take 2 g by mouth daily.      Marland Kitchen HYDROCHLOROTHIAZIDE 12.5 MG PO TABS Oral Take 12.5 mg by mouth daily.      . IBUPROFEN 600 MG PO TABS Oral Take 1 tablet (600 mg total) by mouth every 6 (six) hours as needed for pain. 30 tablet 0  . METFORMIN HCL 500 MG PO TABS Oral Take 1,000 mg by mouth 2 (two) times daily with a meal. 2 tabs for 1000mg  dose    . MULTI-VITAMIN/MINERALS PO TABS Oral Take 1 tablet by mouth daily.      . SUMATRIPTAN SUCCINATE 100 MG PO TABS Oral Take 1 tablet (100 mg total) by mouth every 2 (two)  hours as needed for migraine. One tab at onset and may repeat x1 in 1 hour. Max 200 mg/24 hours 10 tablet 0    BP 160/84  Pulse 85  Temp(Src) 98.5 F (36.9 C) (Oral)  Physical Exam  Nursing note and vitals reviewed. Constitutional: She is oriented to person, place, and time. She appears well-developed and well-nourished. She appears distressed.       Appears uncomfortable. Photophobic. Lying down with sunglasses in a dark room  HENT:  Head: Normocephalic and atraumatic.  Right Ear: Tympanic membrane normal.  Left Ear: Tympanic membrane normal.  Nose: Nose normal. Right sinus exhibits no maxillary sinus tenderness and no frontal sinus tenderness. Left sinus exhibits no maxillary sinus tenderness and no frontal  sinus tenderness.  Mouth/Throat: Uvula is midline, oropharynx is clear and moist and mucous membranes are normal.  Eyes: Conjunctivae and EOM are normal. Pupils are equal, round, and reactive to light.  Fundoscopic exam:      The right eye shows no hemorrhage and no papilledema.       The left eye shows no hemorrhage and no papilledema.  Neck: Normal range of motion and full passive range of motion without pain. Neck supple. No Brudzinski's sign and no Kernig's sign noted.  Cardiovascular: Normal rate, regular rhythm and normal heart sounds.   Pulmonary/Chest: Effort normal and breath sounds normal.  Abdominal: Soft. Bowel sounds are normal. She exhibits no distension.  Musculoskeletal: Normal range of motion.  Lymphadenopathy:    She has no cervical adenopathy.  Neurological: She is alert and oriented to person, place, and time. She has normal strength. She displays no tremor. No cranial nerve deficit or sensory deficit. She displays a negative Romberg sign. Coordination and gait normal.       Finger->nose, heel-> shin WNL. Tandem gait steady.   Skin: Skin is warm and dry.  Psychiatric: She has a normal mood and affect. Her behavior is normal. Judgment and thought content normal.    ED Course  Procedures (including critical care time)  Labs Reviewed - No data to display No results found.   1. Migraine       MDM  Previous labs, records reviewed. Patient was seen in the ER on December 2012 for hyperglycemia after being on steroids. Blood pressure at that time of 149/85.  Pt describing typical pain, no sudden onset. Doubt SAH, ICH or space occupying lesion. Pt without fevers/chills, Pt has no meningeal sx, no nuchal rigidity. Doubt meningitis. Pt with normal neuro exam, no evidence of CVA/TIA.  Pt BP not elevated significantly, doubt hypertensive emergency. Patient also states her blood pressure has been running and "high" prior to her symptoms starting, and was started on lisinopril  20 mg yesterday. Denies chest pain, shortness of breath, abdominal pain, anuria, use of decongestants. No evidence of temporal artery tenderness, no evidence of glaucoma or other occular pathology. Will give headache cocktail (Decadron, Imitrex 6 mg [place], IBU 800 benadryl 50 mg) and reassess.  Pt much improved after medications. Pt with continued non-focal neuro exam. Discussed warning signs and symptoms and when to return to the ER. Will d/c home with nsaid, triptan, fioricet, antiemetic, and have pt F/U with PCP will also refer to Dr. Nicole Kindred, neurologist on call if headache persists.   Cherly Beach, MD 07/05/11 1416

## 2011-07-05 NOTE — ED Notes (Addendum)
3 day HA, saw her primary MD who gave her a "antiinflammatory " med; was awake every hour during night w HA pain, pain in her face; her MD told her they had nothing stronger to give her in the office and to come here; friend was observed to be with pt in the waiting area

## 2011-07-05 NOTE — Discharge Instructions (Signed)
Take the medication as written. Do not take the anti-inflammatory that your Dr. prescribed to if you decide take the ibuprofen. You may want to followup with a neurologist if this continues to be a problem. Return to the ER for the signs and symptoms we discussed, such as fevers, stiff neck, a worsening of your headache, trouble talking, trouble walking, visual changes, chest pain, if you feel like you are about to pass out, if you do pass out, or for any other concerns. This could be from your blood pressure, although this is less likely based on today's discussion and physical.

## 2011-11-24 ENCOUNTER — Other Ambulatory Visit: Payer: Self-pay | Admitting: Family Medicine

## 2011-11-24 DIAGNOSIS — R920 Mammographic microcalcification found on diagnostic imaging of breast: Secondary | ICD-10-CM

## 2011-12-01 ENCOUNTER — Ambulatory Visit
Admission: RE | Admit: 2011-12-01 | Discharge: 2011-12-01 | Disposition: A | Discharge status: Home / Self Care | Payer: Private Health Insurance - Indemnity | Source: Ambulatory Visit | Attending: Family Medicine | Admitting: Family Medicine

## 2011-12-01 DIAGNOSIS — R920 Mammographic microcalcification found on diagnostic imaging of breast: Secondary | ICD-10-CM

## 2012-01-21 ENCOUNTER — Encounter (HOSPITAL_COMMUNITY): Payer: Self-pay | Admitting: Emergency Medicine

## 2012-01-21 ENCOUNTER — Emergency Department (INDEPENDENT_AMBULATORY_CARE_PROVIDER_SITE_OTHER)
Admission: EM | Admit: 2012-01-21 | Discharge: 2012-01-21 | Disposition: A | Discharge status: Home / Self Care | Payer: Managed Care, Other (non HMO) | Source: Home / Self Care

## 2012-01-21 DIAGNOSIS — M549 Dorsalgia, unspecified: Secondary | ICD-10-CM

## 2012-01-21 DIAGNOSIS — S335XXA Sprain of ligaments of lumbar spine, initial encounter: Secondary | ICD-10-CM

## 2012-01-21 MED ORDER — TRAMADOL HCL 50 MG PO TABS: 50.0000 mg | ORAL_TABLET | Freq: Four times a day (QID) | ORAL | Status: DC | PRN

## 2012-01-21 MED ORDER — CYCLOBENZAPRINE HCL 10 MG PO TABS: 10.0000 mg | ORAL_TABLET | Freq: Two times a day (BID) | ORAL | Status: DC | PRN

## 2012-01-21 MED ORDER — KETOROLAC TROMETHAMINE 60 MG/2ML IM SOLN
INTRAMUSCULAR | Status: AC
  Filled 2012-01-21: qty 2

## 2012-01-21 MED ORDER — MELOXICAM 7.5 MG PO TABS: 7.5000 mg | ORAL_TABLET | Freq: Every day | ORAL | Status: DC

## 2012-01-21 MED ORDER — KETOROLAC TROMETHAMINE 60 MG/2ML IM SOLN
60.0000 mg | Freq: Once | INTRAMUSCULAR | Status: AC
  Administered 2012-01-21: 60 mg via INTRAMUSCULAR

## 2012-01-21 NOTE — ED Notes (Signed)
C/o back and leg pain onset Monday 01/15/2012.  No known injury.  Right mid-low back pain and into right leg, posteriorly.

## 2012-01-21 NOTE — ED Provider Notes (Signed)
Medical screening examination/treatment/procedure(s) were performed by resident physician or non-physician practitioner and as supervising physician I was immediately available for consultation/collaboration.   Pauline Good MD.    Billy Fischer, MD 01/21/12 [phone]

## 2012-01-21 NOTE — ED Provider Notes (Signed)
History     CSN: [id number]  Arrival date & time 01/21/12  1026   None     Chief Complaint  Patient presents with  . Back Pain    (Consider location/radiation/quality/duration/timing/severity/associated sxs/prior treatment) Patient is a 46 y.o. female presenting with back pain. The history is provided by the patient.  Back Pain  This is a new problem. The current episode started more than 2 days ago (6 days ago). The problem occurs daily. The problem has been gradually worsening. The pain is associated with no known injury. The pain is present in the lumbar spine. The quality of the pain is described as cramping and aching. The pain radiates to the right thigh (posterior). The pain is at a severity of 7/10. The symptoms are aggravated by bending and certain positions. The pain is worse during the night. Stiffness is present in the morning. Pertinent negatives include no numbness, no bowel incontinence, no perianal numbness, no bladder incontinence and no tingling. She has tried heat and NSAIDs (advil rapid relief) for the symptoms. The treatment provided mild relief. Risk factors include obesity and lack of exercise (desk job.).    Past Medical History  Diagnosis Date  . Diabetes mellitus   . Hypertension   . Migraine   . Sinusitis     Past Surgical History  Procedure Date  . Abdominal hysterectomy   . Anterior cruciate ligament repair     No family history on file.  History  Substance Use Topics  . Smoking status: Current Every Day Smoker -- 0.5 packs/day    Types: Cigarettes  . Smokeless tobacco: Not on file  . Alcohol Use: No    OB History    Grav Para Term Preterm Abortions TAB SAB Ect Mult Living                  Review of Systems  Constitutional: Negative.   Respiratory: Negative.   Cardiovascular: Negative.   Gastrointestinal: Negative for bowel incontinence.  Genitourinary: Negative for bladder incontinence.  Musculoskeletal: Positive for back pain.    Neurological: Negative for tingling and numbness.  All other systems reviewed and are negative.    Allergies  Review of patient's allergies indicates no known allergies.  Home Medications   Current Outpatient Rx  Name Route Sig Dispense Refill  . CYCLOBENZAPRINE HCL 10 MG PO TABS Oral Take 1 tablet (10 mg total) by mouth 2 (two) times daily as needed for muscle spasms. 20 tablet 0  . OMEGA-3 FATTY ACIDS 1000 MG PO CAPS Oral Take 2 g by mouth daily.      Marland Kitchen FLUTICASONE PROPIONATE 50 MCG/ACT NA SUSP Nasal Place 2 sprays into the nose daily.    Marland Kitchen HYDROCHLOROTHIAZIDE 12.5 MG PO TABS Oral Take 12.5 mg by mouth daily.      Marland Kitchen LISINOPRIL 20 MG PO TABS Oral Take 20 mg by mouth daily.    . MELOXICAM 7.5 MG PO TABS Oral Take 1 tablet (7.5 mg total) by mouth daily. 30 tablet 1  . METFORMIN HCL 500 MG PO TABS Oral Take 1,000 mg by mouth 2 (two) times daily with a meal. 2 tabs for 1000mg  dose    . MULTI-VITAMIN/MINERALS PO TABS Oral Take 1 tablet by mouth daily.      . SUMATRIPTAN SUCCINATE 100 MG PO TABS Oral Take 1 tablet (100 mg total) by mouth every 2 (two) hours as needed for migraine. One tab at onset and may repeat x1 in 1 hour. Max 200  mg/24 hours 10 tablet 0  . TRAMADOL HCL 50 MG PO TABS Oral Take 1 tablet (50 mg total) by mouth every 6 (six) hours as needed for pain. 15 tablet 0    BP 147/90  Pulse 98  Temp 98.4 F (36.9 C) (Oral)  Resp 18  SpO2 98%  Physical Exam  Nursing note and vitals reviewed. Constitutional: She is oriented to person, place, and time. Vital signs are normal. She appears well-developed and well-nourished. She is active and cooperative.  HENT:  Head: Normocephalic.  Eyes: Conjunctivae normal are normal. Pupils are equal, round, and reactive to light. No scleral icterus.  Neck: Trachea normal, normal range of motion and full passive range of motion without pain. Neck supple. No spinous process tenderness and no muscular tenderness present.  Cardiovascular:  Normal rate, regular rhythm, normal heart sounds, intact distal pulses and normal pulses.   Pulmonary/Chest: Effort normal and breath sounds normal.  Musculoskeletal: Normal range of motion.       Thoracic back: Normal.       Lumbar back: Normal.       Back:       Right flank tenderness, no sacroiliac tenderness   Neurological: She is alert and oriented to person, place, and time. She has normal strength and normal reflexes. No cranial nerve deficit or sensory deficit. Coordination and gait normal. GCS eye subscore is 4. GCS verbal subscore is 5. GCS motor subscore is 6.  Skin: Skin is warm and dry.  Psychiatric: She has a normal mood and affect. Her speech is normal and behavior is normal. Judgment and thought content normal. Cognition and memory are normal.    ED Course  Procedures (including critical care time)  Labs Reviewed - No data to display No results found.   1. Lumbar back sprain   2. Back pain       MDM  Medications as prescribed. Heat therapy.  Follow up with primary care physician as needed.        Awilda Metro, NP 01/21/12 1239

## 2013-03-23 DIAGNOSIS — M545 Low back pain, unspecified: Secondary | ICD-10-CM | POA: Insufficient documentation

## 2013-06-01 ENCOUNTER — Encounter (HOSPITAL_COMMUNITY): Payer: Self-pay | Admitting: Emergency Medicine

## 2013-06-01 ENCOUNTER — Emergency Department (HOSPITAL_COMMUNITY)
Admission: EM | Admit: 2013-06-01 | Discharge: 2013-06-01 | Disposition: A | Discharge status: Home / Self Care | Payer: Managed Care, Other (non HMO) | Source: Home / Self Care | Attending: Family Medicine | Admitting: Family Medicine

## 2013-06-01 DIAGNOSIS — K089 Disorder of teeth and supporting structures, unspecified: Secondary | ICD-10-CM

## 2013-06-01 DIAGNOSIS — K0889 Other specified disorders of teeth and supporting structures: Secondary | ICD-10-CM

## 2013-06-01 MED ORDER — ONDANSETRON HCL 8 MG PO TABS: 8.0000 mg | ORAL_TABLET | Freq: Three times a day (TID) | ORAL | Status: DC | PRN

## 2013-06-01 MED ORDER — MAGIC MOUTHWASH: 5.0000 mL | Freq: Four times a day (QID) | ORAL | Status: DC | PRN

## 2013-06-01 MED ORDER — ONDANSETRON 4 MG PO TBDP
ORAL_TABLET | ORAL | Status: AC
  Filled 2013-06-01: qty 2

## 2013-06-01 MED ORDER — OXYCODONE-ACETAMINOPHEN 5-325 MG PO TABS: 1.0000 | ORAL_TABLET | ORAL | Status: DC | PRN

## 2013-06-01 MED ORDER — ONDANSETRON 4 MG PO TBDP
8.0000 mg | ORAL_TABLET | Freq: Once | ORAL | Status: AC
  Administered 2013-06-01: 8 mg via ORAL

## 2013-06-01 NOTE — ED Notes (Signed)
C/O right upper toothache x 1 wk.  Does not have dentist.  Catherine Pope to PCP 2/23 - was placed on Pen VK and hydrocodone.  States has had no improvement and actually is feeling worse.  Has had some bouts of nausea and sweats.

## 2013-06-01 NOTE — Discharge Instructions (Signed)
Please call Dr. Donn Pierini office [phone] or cell [phone] [address], [place] for tooth removal $200 includes exam, Xray, and extraction and follow up visit.  Bring list of current medications with you.   Please call [facility] at [phone] to get the details to get your tooth pulled.    Dental Pain Toothache is pain in or around a tooth. It may get worse with chewing or with cold or heat.  HOME CARE  Your dentist may use a numbing medicine during treatment. If so, you may need to avoid eating until the medicine wears off. Ask your dentist about this.  Only take medicine as told by your dentist or doctor.  Avoid chewing food near the painful tooth until after all treatment is done. Ask your dentist about this. GET HELP RIGHT AWAY IF:   The problem gets worse or new problems appear.  You have a fever.  There is redness and puffiness (swelling) of the face, jaw, or neck.  You cannot open your mouth.  There is pain in the jaw.  There is very bad pain that is not helped by medicine. MAKE SURE YOU:   Understand these instructions.  Will watch your condition.  Will get help right away if you are not doing well or get worse. Document Released: 09/06/2007 Document Revised: 06/12/2011 Document Reviewed: 09/06/2007 [facility] Patient Information 2014 [place], [place].

## 2013-06-01 NOTE — ED Provider Notes (Signed)
CSN: [id number]     Arrival date & time 06/01/13  1313 History   First MD Initiated Contact with Patient 06/01/13 1425     Chief Complaint  Patient presents with  . Dental Pain   (Consider location/radiation/quality/duration/timing/severity/associated sxs/prior Treatment) HPI Patient is a 48 yo F presenting with tooth pain on upper right side. She states her tooth broke about one week ago and pain has been getting worse since then. Was evaluated by PCP who prescribed penicillin and Vicodin, which is not helping much. She has not been able to get into see a dentist. She has never had dental problems before other than having teeth pulled for other reasons. No bleeding, no pus or discharge around tooth. No fevers. She reports some nausea which started after she started the antibiotic. No vomiting.  Past Medical History  Diagnosis Date  . Diabetes mellitus   . Hypertension   . Migraine   . Sinusitis    Past Surgical History  Procedure Laterality Date  . Abdominal hysterectomy    . Anterior cruciate ligament repair     No family history on file. History  Substance Use Topics  . Smoking status: Current Every Day Smoker -- 0.50 packs/day    Types: Cigarettes  . Smokeless tobacco: Not on file  . Alcohol Use: No   OB History   Grav Para Term Preterm Abortions TAB SAB Ect Mult Living                 Review of Systems  Constitutional: Negative for fever and chills.  HENT: Positive for dental problem. Negative for congestion, drooling and mouth sores.   Eyes: Negative for visual disturbance.  Respiratory: Negative for cough and shortness of breath.   Cardiovascular: Negative for chest pain.  Gastrointestinal: Negative for abdominal pain.  Genitourinary: Negative for dysuria.  Musculoskeletal: Negative for arthralgias and myalgias.  Skin: Negative for rash.  Neurological: Negative for headaches.    Allergies  Review of patient's allergies indicates no known allergies.  Home  Medications   Current Outpatient Rx  Name  Route  Sig  Dispense  Refill  . fish oil-omega-3 fatty acids 1000 MG capsule   Oral   Take 2 g by mouth daily.           . fluticasone (FLONASE) 50 MCG/ACT nasal spray   Nasal   Place 2 sprays into the nose daily.         . hydrochlorothiazide (HYDRODIURIL) 12.5 MG tablet   Oral   Take 12.5 mg by mouth daily.           Marland Kitchen lisinopril (PRINIVIL,ZESTRIL) 20 MG tablet   Oral   Take 20 mg by mouth daily.         . metFORMIN (GLUCOPHAGE) 500 MG tablet   Oral   Take 1,000 mg by mouth 2 (two) times daily with a meal. 2 tabs for 1000mg  dose         . traMADol (ULTRAM) 50 MG tablet   Oral   Take 1 tablet (50 mg total) by mouth every 6 (six) hours as needed for pain.   15 tablet   0   . Alum & Mag Hydroxide-Simeth (MAGIC MOUTHWASH) SOLN   Oral   Take 5 mLs by mouth 4 (four) times daily as needed for mouth pain.   100 mL   0   . cyclobenzaprine (FLEXERIL) 10 MG tablet   Oral   Take 1 tablet (10 mg total) by mouth  2 (two) times daily as needed for muscle spasms.   20 tablet   0   . meloxicam (MOBIC) 7.5 MG tablet   Oral   Take 1 tablet (7.5 mg total) by mouth daily.   30 tablet   1   . Multiple Vitamins-Minerals (MULTIVITAMIN WITH MINERALS) tablet   Oral   Take 1 tablet by mouth daily.           . ondansetron (ZOFRAN) 8 MG tablet   Oral   Take 1 tablet (8 mg total) by mouth every 8 (eight) hours as needed for nausea or vomiting.   10 tablet   0   . oxyCODONE-acetaminophen (PERCOCET/ROXICET) 5-325 MG per tablet   Oral   Take 1 tablet by mouth every 4 (four) hours as needed for severe pain.   10 tablet   0   . EXPIRED: SUMAtriptan (IMITREX) 100 MG tablet   Oral   Take 1 tablet (100 mg total) by mouth every 2 (two) hours as needed for migraine. One tab at onset and may repeat x1 in 1 hour. Max 200 mg/24 hours   10 tablet   0    BP 191/107  Pulse 106  Temp(Src) 99 F (37.2 C) (Oral)  Resp 21  SpO2  99% Physical Exam  Constitutional: She is oriented to person, place, and time. She appears well-developed and well-nourished. No distress.  HENT:  Head: Normocephalic and atraumatic.  Mouth/Throat: Oropharynx is clear and moist and mucous membranes are normal. Abnormal dentition. Dental caries present. No uvula swelling.    Eyes: Conjunctivae and EOM are normal.  Neck: Normal range of motion. Neck supple.  Cardiovascular: Normal rate, regular rhythm and normal heart sounds.   Pulmonary/Chest: Effort normal and breath sounds normal. She has no wheezes.  Abdominal: Soft. She exhibits no distension. There is no tenderness.  Musculoskeletal: Normal range of motion. She exhibits no edema and no tenderness.  Lymphadenopathy:    She has no cervical adenopathy.  Neurological: She is alert and oriented to person, place, and time. Coordination normal.  Skin: Skin is warm and dry. No rash noted.  Psychiatric: She has a normal mood and affect.    ED Course  Procedures (including critical care time) Labs Review Labs Reviewed - No data to display Imaging Review No results found.  MDM   1. Pain, dental    48 yo F with dental pain. Advised that her pain will not improve until she has tooth pulled. Given resources for local dentists. - Con't Penicillin - Given Percocet #10 for pain and advised that we will not continue to give pain medication - Magic mouthwash to swish and spit - Zofran prn nausea - F/u with PCP, but strongly encouraged to save funds for tooth extraction    Montez Morita, MD 06/01/13 1507

## 2013-06-02 NOTE — ED Provider Notes (Signed)
Medical screening examination/treatment/procedure(s) were performed by a resident physician or non-physician practitioner and as the supervising physician I was immediately available for consultation/collaboration.  Lynne Leader, MD    Gregor Hams, MD 06/02/13 [phone]

## 2013-11-26 ENCOUNTER — Ambulatory Visit (INDEPENDENT_AMBULATORY_CARE_PROVIDER_SITE_OTHER): Payer: 59 | Admitting: Family Medicine

## 2013-11-26 VITALS — BP 132/88 | HR 83 | Temp 98.5°F | Resp 16 | Ht 66.0 in | Wt 239.4 lb

## 2013-11-26 DIAGNOSIS — G43009 Migraine without aura, not intractable, without status migrainosus: Secondary | ICD-10-CM | POA: Insufficient documentation

## 2013-11-26 DIAGNOSIS — R739 Hyperglycemia, unspecified: Secondary | ICD-10-CM

## 2013-11-26 DIAGNOSIS — E1165 Type 2 diabetes mellitus with hyperglycemia: Secondary | ICD-10-CM

## 2013-11-26 DIAGNOSIS — R7309 Other abnormal glucose: Secondary | ICD-10-CM

## 2013-11-26 DIAGNOSIS — E119 Type 2 diabetes mellitus without complications: Secondary | ICD-10-CM

## 2013-11-26 DIAGNOSIS — E785 Hyperlipidemia, unspecified: Secondary | ICD-10-CM | POA: Insufficient documentation

## 2013-11-26 DIAGNOSIS — E118 Type 2 diabetes mellitus with unspecified complications: Principal | ICD-10-CM

## 2013-11-26 DIAGNOSIS — G43019 Migraine without aura, intractable, without status migrainosus: Secondary | ICD-10-CM

## 2013-11-26 DIAGNOSIS — IMO0002 Reserved for concepts with insufficient information to code with codable children: Secondary | ICD-10-CM

## 2013-11-26 LAB — POCT GLYCOSYLATED HEMOGLOBIN (HGB A1C): Hemoglobin A1C: 12.1

## 2013-11-26 LAB — COMPREHENSIVE METABOLIC PANEL
ALT: 15 U/L (ref 0–35)
AST: 19 U/L (ref 0–37)
Albumin: 4.4 g/dL (ref 3.5–5.2)
Alkaline Phosphatase: 77 U/L (ref 39–117)
BUN: 6 mg/dL (ref 6–23)
CO2: 21 mEq/L (ref 19–32)
Calcium: 9.2 mg/dL (ref 8.4–10.5)
Chloride: 99 mEq/L (ref 96–112)
Creat: 0.61 mg/dL (ref 0.50–1.10)
Glucose, Bld: 352 mg/dL — ABNORMAL HIGH (ref 70–99)
Potassium: 4.1 mEq/L (ref 3.5–5.3)
Sodium: 132 mEq/L — ABNORMAL LOW (ref 135–145)
Total Bilirubin: 0.5 mg/dL (ref 0.2–1.2)
Total Protein: 7.6 g/dL (ref 6.0–8.3)

## 2013-11-26 LAB — POCT CBC
Granulocyte percent: 65.9 %G (ref 37–80)
HCT, POC: 44.8 % (ref 37.7–47.9)
Hemoglobin: 13.6 g/dL (ref 12.2–16.2)
Lymph, poc: 3.9 — AB (ref 0.6–3.4)
MCH, POC: 27 pg (ref 27–31.2)
MCHC: 30.3 g/dL — AB (ref 31.8–35.4)
MCV: 89.2 fL (ref 80–97)
MID (cbc): 0.5 (ref 0–0.9)
MPV: 9.6 fL (ref 0–99.8)
POC Granulocyte: 8.5 — AB (ref 2–6.9)
POC LYMPH PERCENT: 30.2 %L (ref 10–50)
POC MID %: 3.9 %M (ref 0–12)
Platelet Count, POC: 257 10*3/uL (ref 142–424)
RBC: 5.02 M/uL (ref 4.04–5.48)
RDW, POC: 14.3 %
WBC: 12.9 10*3/uL — AB (ref 4.6–10.2)

## 2013-11-26 LAB — GLUCOSE, POCT (MANUAL RESULT ENTRY): POC Glucose: 365 mg/dl — AB (ref 70–99)

## 2013-11-26 MED ORDER — INSULIN GLARGINE 100 UNIT/ML SOLOSTAR PEN: 10.0000 [IU] | PEN_INJECTOR | Freq: Every day | SUBCUTANEOUS | Status: DC

## 2013-11-26 MED ORDER — KETOROLAC TROMETHAMINE 30 MG/ML IJ SOLN
30.0000 mg | Freq: Once | INTRAMUSCULAR | Status: AC
  Administered 2013-11-26: 30 mg via INTRAMUSCULAR

## 2013-11-26 MED ORDER — INSULIN GLARGINE 100 UNIT/ML ~~LOC~~ SOLN: 10.0000 [IU] | Freq: Once | SUBCUTANEOUS | Status: DC

## 2013-11-26 NOTE — Progress Notes (Signed)
[facility] [address], [place] [address] [phone]  Date:  11/26/2013   Name:  Catherine Pope   DOB:  April 26, 1965   MRN:  [id number]  PCP:  No PCP Per Patient    Chief Complaint: Migraine   History of Present Illness:  Catherine Pope is a 48 y.o. very pleasant female patient who presents with the following:  Here today as a new patient with migraine headache for one week.  History of DM, HTN.  She has had migraines since she was 14.  She may have a headache on 10- 12 days a month.  They generally last 2-3 days, but this one has lasted a week.  However otherwise it is not an unusually severe HA and is not her worst HA of life.   Her PCP has her on propranolol to use as needed for headache prevention.   She has not noted any ST, fever, cough, but she does feel tired. She does get nauseated but no vomiting. She has phono and photoprobia. She does not get aura.  Except for lasting so long, this HA is typical for her.  She has been seen at the headache center in the past.  She had been taking topamax for prophylaxis but stopped about 3 months ago.   She has noted that her sugar is running "a little high." Her father died a few months ago and she stopped taking care of herself as well as she knows that she should  She did drive here today.   She took just her blood pressure meds so far today.   S/p hysterecomy  She has noted frequent urination, weight loss and thirst for a couple of months.  Her A1c had generally been around 7 but was 10 the last time she saw her PCP about 6 weeks ago. She has never been on insulin  There are no active problems to display for this patient.   Past Medical History  Diagnosis Date  . Diabetes mellitus   . Hypertension   . Migraine   . Sinusitis     Past Surgical History  Procedure Laterality Date  . Abdominal hysterectomy    . Anterior cruciate ligament repair      History  Substance Use Topics  . Smoking  status: Current Every Day Smoker -- 0.50 packs/day    Types: Cigarettes  . Smokeless tobacco: Not on file  . Alcohol Use: No    History reviewed. No pertinent family history.  No Known Allergies  Medication list has been reviewed and updated.  Current Outpatient Prescriptions on File Prior to Visit  Medication Sig Dispense Refill  . cyclobenzaprine (FLEXERIL) 10 MG tablet Take 1 tablet (10 mg total) by mouth 2 (two) times daily as needed for muscle spasms.  20 tablet  0  . fish oil-omega-3 fatty acids 1000 MG capsule Take 2 g by mouth daily.        . fluticasone (FLONASE) 50 MCG/ACT nasal spray Place 2 sprays into the nose daily.      Marland Kitchen lisinopril (PRINIVIL,ZESTRIL) 20 MG tablet Take 20 mg by mouth daily.      . metFORMIN (GLUCOPHAGE) 500 MG tablet Take 1,000 mg by mouth 2 (two) times daily with a meal. 2 tabs for 1000mg  dose      . Multiple Vitamins-Minerals (MULTIVITAMIN WITH MINERALS) tablet Take 1 tablet by mouth daily.        . traMADol (ULTRAM) 50 MG tablet Take 1 tablet (  50 mg total) by mouth every 6 (six) hours as needed for pain.  15 tablet  0  . ondansetron (ZOFRAN) 8 MG tablet Take 1 tablet (8 mg total) by mouth every 8 (eight) hours as needed for nausea or vomiting.  10 tablet  0  . SUMAtriptan (IMITREX) 100 MG tablet Take 1 tablet (100 mg total) by mouth every 2 (two) hours as needed for migraine. One tab at onset and may repeat x1 in 1 hour. Max 200 mg/24 hours  10 tablet  0   No current facility-administered medications on file prior to visit.    Review of Systems:  As per HPI- otherwise negative.   Physical Examination: Filed Vitals:   11/26/13 0856  BP: 132/88  Pulse: 83  Temp: 98.5 F (36.9 C)  Resp: 16   Filed Vitals:   11/26/13 0856  Height: 5\' 6"  (1.676 m)  Weight: 239 lb 6.4 oz (108.591 kg)   Body mass index is 38.66 kg/(m^2). Ideal Body Weight: Weight in (lb) to have BMI = 25: 154.6  GEN: WDWN, NAD, Non-toxic, A & O x 3, obese, looks  well HEENT: Atraumatic, Normocephalic. Neck supple. No masses, No LAD.  Bilateral TM wnl, oropharynx normal.  PEERL,EOMI.   Ears and Nose: No external deformity. CV: RRR, No M/G/R. No JVD. No thrill. No extra heart sounds. PULM: CTA B, no wheezes, crackles, rhonchi. No retractions. No resp. distress. No accessory muscle use. ABD: S, NT, ND, +BS. No rebound. No HSM. EXTR: No c/c/e NEURO Normal gait. Normal strength, sensation, DTR all extremities and normal facial movement.  Negative arm drift, normal balance PSYCH: Normally interactive. Conversant. Not depressed or anxious appearing.  Calm demeanor.   Results for orders placed in visit on 11/26/13  COMPREHENSIVE METABOLIC PANEL      Result Value Ref Range   Sodium 132 (*) 135 - 145 mEq/L   Potassium 4.1  3.5 - 5.3 mEq/L   Chloride 99  96 - 112 mEq/L   CO2 21  19 - 32 mEq/L   Glucose, Bld 352 (*) 70 - 99 mg/dL   BUN 6  6 - 23 mg/dL   Creat 0.61  0.50 - 1.10 mg/dL   Total Bilirubin 0.5  0.2 - 1.2 mg/dL   Alkaline Phosphatase 77  39 - 117 U/L   AST 19  0 - 37 U/L   ALT 15  0 - 35 U/L   Total Protein 7.6  6.0 - 8.3 g/dL   Albumin 4.4  3.5 - 5.2 g/dL   Calcium 9.2  8.4 - 10.5 mg/dL  POCT CBC      Result Value Ref Range   WBC 12.9 (*) 4.6 - 10.2 K/uL   Lymph, poc 3.9 (*) 0.6 - 3.4   POC LYMPH PERCENT 30.2  10 - 50 %L   MID (cbc) 0.5  0 - 0.9   POC MID % 3.9  0 - 12 %M   POC Granulocyte 8.5 (*) 2 - 6.9   Granulocyte percent 65.9  37 - 80 %G   RBC 5.02  4.04 - 5.48 M/uL   Hemoglobin 13.6  12.2 - 16.2 g/dL   HCT, POC 44.8  37.7 - 47.9 %   MCV 89.2  80 - 97 fL   MCH, POC 27.0  27 - 31.2 pg   MCHC 30.3 (*) 31.8 - 35.4 g/dL   RDW, POC 14.3     Platelet Count, POC 257  142 - 424 K/uL  MPV 9.6  0 - 99.8 fL  GLUCOSE, POCT (MANUAL RESULT ENTRY)      Result Value Ref Range   POC Glucose 365 (*) 70 - 99 mg/dl  POCT GLYCOSYLATED HEMOGLOBIN (HGB A1C)      Result Value Ref Range   Hemoglobin A1C 12.1      GIven toradol 30mg  in  clinic.    Assessment and Plan: Type II or unspecified type diabetes mellitus with unspecified complication, uncontrolled - Plan: POCT CBC, POCT glucose (manual entry), POCT glycosylated hemoglobin (Hb A1C), insulin glargine (LANTUS) injection 10 Units, Comprehensive metabolic panel, Insulin Glargine (LANTUS SOLOSTAR) 100 UNIT/ML Solostar Pen, CANCELED: Comprehensive metabolic panel  Intractable migraine without aura and without status migrainosus - Plan: ketorolac (TORADOL) 30 MG/ML injection 30 mg  Hyperglycemia  Suspect HA is due to uncontrolled DM and dehydration.  She declined IVF as she is able to take PO.  We gave her 30mg  of toradol.  She went to the drug store and returned with lantus; gave her 10 units of her own supply, taught use of pen.  Received her stat CMP- anion gap is 12, corrected na is 136.  Called to discuss with her; her diabetes currently requires insulin, but we hope this will not always be the case.  She will increase her lantus by 2 units every 2 days as long as her FBS is more than 150.  Plan recheck in 2 weeks.  Sooner if she is not feeling better.  Encouraged hydration.    Signed Lamar Blinks, MD

## 2013-11-26 NOTE — Patient Instructions (Addendum)
I will call you later today with the rest of your labs and we will make our plan.  Please go pick up your insulin pen and I will give you your first dose.  This way you will have enough to use at home for the next few days.

## 2013-11-27 ENCOUNTER — Telehealth: Payer: Self-pay | Admitting: Family Medicine

## 2013-11-27 NOTE — Telephone Encounter (Signed)
Called to check on her- her glucose this am was 295.  She is feeling ok.  She will come and see me in 2-3 weeks

## 2013-12-11 ENCOUNTER — Ambulatory Visit (INDEPENDENT_AMBULATORY_CARE_PROVIDER_SITE_OTHER): Payer: 59 | Admitting: Family Medicine

## 2013-12-11 VITALS — BP 138/80 | HR 86 | Temp 98.0°F | Resp 20 | Ht 66.0 in | Wt 243.1 lb

## 2013-12-11 DIAGNOSIS — J309 Allergic rhinitis, unspecified: Secondary | ICD-10-CM

## 2013-12-11 DIAGNOSIS — G43809 Other migraine, not intractable, without status migrainosus: Secondary | ICD-10-CM

## 2013-12-11 MED ORDER — HYDROCODONE-ACETAMINOPHEN 5-325 MG PO TABS: 1.0000 | ORAL_TABLET | Freq: Four times a day (QID) | ORAL | Status: DC | PRN

## 2013-12-11 MED ORDER — ONDANSETRON HCL 4 MG PO TABS: 4.0000 mg | ORAL_TABLET | Freq: Three times a day (TID) | ORAL | Status: DC | PRN

## 2013-12-11 NOTE — Patient Instructions (Signed)
Restart allergy nasal spray - every day.   We will refer you to other neurologist to discuss these headaches. Zofran if needed for nausea.  Hydrocodone only if needed short term.  Return to the clinic or go to the nearest emergency room if any of your symptoms worsen or new symptoms occur. Keep follow up with Dr. Lorelei Pont.   Migraine Headache A migraine headache is an intense, throbbing pain on one or both sides of your head. A migraine can last for 30 minutes to several hours. CAUSES  The exact cause of a migraine headache is not always known. However, a migraine may be caused when nerves in the brain become irritated and release chemicals that cause inflammation. This causes pain. Certain things may also trigger migraines, such as:  Alcohol.  Smoking.  Stress.  Menstruation.  Aged cheeses.  Foods or drinks that contain nitrates, glutamate, aspartame, or tyramine.  Lack of sleep.  Chocolate.  Caffeine.  Hunger.  Physical exertion.  Fatigue.  Medicines used to treat chest pain (nitroglycerine), birth control pills, estrogen, and some blood pressure medicines. SIGNS AND SYMPTOMS  Pain on one or both sides of your head.  Pulsating or throbbing pain.  Severe pain that prevents daily activities.  Pain that is aggravated by any physical activity.  Nausea, vomiting, or both.  Dizziness.  Pain with exposure to bright lights, loud noises, or activity.  General sensitivity to bright lights, loud noises, or smells. Before you get a migraine, you may get warning signs that a migraine is coming (aura). An aura may include:  Seeing flashing lights.  Seeing bright spots, halos, or zigzag lines.  Having tunnel vision or blurred vision.  Having feelings of numbness or tingling.  Having trouble talking.  Having muscle weakness. DIAGNOSIS  A migraine headache is often diagnosed based on:  Symptoms.  Physical exam.  A CT scan or MRI of your head. These imaging  tests cannot diagnose migraines, but they can help rule out other causes of headaches. TREATMENT Medicines may be given for pain and nausea. Medicines can also be given to help prevent recurrent migraines.  HOME CARE INSTRUCTIONS  Only take over-the-counter or prescription medicines for pain or discomfort as directed by your health care provider. The use of long-term narcotics is not recommended.  Lie down in a dark, quiet room when you have a migraine.  Keep a journal to find out what may trigger your migraine headaches. For example, write down:  What you eat and drink.  How much sleep you get.  Any change to your diet or medicines.  Limit alcohol consumption.  Quit smoking if you smoke.  Get 7-9 hours of sleep, or as recommended by your health care provider.  Limit stress.  Keep lights dim if bright lights bother you and make your migraines worse. SEEK IMMEDIATE MEDICAL CARE IF:   Your migraine becomes severe.  You have a fever.  You have a stiff neck.  You have vision loss.  You have muscular weakness or loss of muscle control.  You start losing your balance or have trouble walking.  You feel faint or pass out.  You have severe symptoms that are different from your first symptoms. MAKE SURE YOU:   Understand these instructions.  Will watch your condition.  Will get help right away if you are not doing well or get worse. Document Released: 03/20/2005 Document Revised: 08/04/2013 Document Reviewed: 11/25/2012 [facility] Patient Information 2015 [place], [place]. This information is not intended to replace  advice given to you by your health care provider. Make sure you discuss any questions you have with your health care provider.  

## 2013-12-11 NOTE — Progress Notes (Signed)
Subjective:    Patient ID: Catherine Pope, female    DOB: 22-Nov-1965, 48 y.o.   MRN: [id number] This chart was scribed for Wendie Agreste, MD by Martinique Peace, ED Scribe. The patient was seen in [facility]. The patient's care was started at 9:13 PM.  HPI HPI Comments: Catherine Pope is a 48 y.o. female who presents to the Emergency Department complaining of severe headache specifically to frontal aspect of head with associated irritability and increased sensitivity to light and smell. Pt has history of migraines dating back to when she was 48 years old. She reports this headache is similar to previous occurrences she has had before. Pt states that her neurologist is Dr. Domingo Cocking and she saw him most recently earlier this year. Pt states that he put her on Topamax initially and even increased the dosages after not feeling any relief but still reported no improvement. Pt denies experiencing any nausea or stroke like symptoms. She further denies taking any medication since onset of incident due to nothing being effective for her. She denies any history of seizures to her knowledge. Hx of DM2 -Pt reports that her last sugar level this morning read 193.    Review of Systems  Eyes: Negative for visual disturbance.  Gastrointestinal: Negative for nausea.  Neurological: Positive for headaches. Negative for dizziness, tremors, seizures and speech difficulty.  Psychiatric/Behavioral: Positive for agitation.       Objective:   Physical Exam  Vitals reviewed. Constitutional: She is oriented to person, place, and time. She appears well-developed and well-nourished.  HENT:  Head: Normocephalic and atraumatic.  Eyes: Conjunctivae and EOM are normal. Pupils are equal, round, and reactive to light. Right eye exhibits no nystagmus. Left eye exhibits no nystagmus.  Neck: Normal range of motion. Neck supple. Carotid bruit is not present.  Cardiovascular: Normal rate, regular rhythm, normal heart sounds and intact  distal pulses.   Pulmonary/Chest: Effort normal and breath sounds normal.  Abdominal: Soft. She exhibits no pulsatile midline mass. There is no tenderness.  Neurological: She is alert and oriented to person, place, and time. She has normal strength. No cranial nerve deficit or sensory deficit. She displays a negative Romberg sign. Coordination and gait normal. GCS eye subscore is 4. GCS verbal subscore is 5. GCS motor subscore is 6.  No pronator drift. Nonfocal.   Skin: Skin is warm and dry.  Psychiatric: She has a normal mood and affect. Her speech is normal and behavior is normal. Thought content normal.      Filed Vitals:   12/11/13 1957  BP: 138/80  Pulse: 86  Temp: 98 F (36.7 C)  TempSrc: Oral  Resp: 20  Height: 5\' 6"  (1.676 m)  Weight: 243 lb 2 oz (110.281 kg)  SpO2: 99%       Assessment & Plan:   Catherine Pope is a 48 y.o. female Other migraine without status migrainosus, not intractable - Plan: ondansetron (ZOFRAN) 4 MG tablet, HYDROcodone-acetaminophen (NORCO/VICODIN) 5-325 MG per tablet, Ambulatory referral to Neurology  Allergic rhinitis, unspecified allergic rhinitis type Underlying migraine, with recurrence since yesterday, but allergic symptoms may be contributory. Nontoxic appearaance and minimally uncomfortable appearing with reassuring exam. Will provide short term lortab (SED) zofran if nausea starts, restart NS and refer to different neurologist at her request for eval of migraines, especially with reported frequency. Rtc/er precautions given.    Meds ordered this encounter  Medications  . ondansetron (ZOFRAN) 4 MG tablet    Sig: Take 1 tablet (4 mg  total) by mouth every 8 (eight) hours as needed for nausea or vomiting.    Dispense:  10 tablet    Refill:  0  . HYDROcodone-acetaminophen (NORCO/VICODIN) 5-325 MG per tablet    Sig: Take 1 tablet by mouth every 6 (six) hours as needed for moderate pain.    Dispense:  10 tablet    Refill:  0   Patient  Instructions  Restart allergy nasal spray - every day.   We will refer you to other neurologist to discuss these headaches. Zofran if needed for nausea.  Hydrocodone only if needed short term.  Return to the clinic or go to the nearest emergency room if any of your symptoms worsen or new symptoms occur. Keep follow up with Dr. Lorelei Pont.   Migraine Headache A migraine headache is an intense, throbbing pain on one or both sides of your head. A migraine can last for 30 minutes to several hours. CAUSES  The exact cause of a migraine headache is not always known. However, a migraine may be caused when nerves in the brain become irritated and release chemicals that cause inflammation. This causes pain. Certain things may also trigger migraines, such as:  Alcohol.  Smoking.  Stress.  Menstruation.  Aged cheeses.  Foods or drinks that contain nitrates, glutamate, aspartame, or tyramine.  Lack of sleep.  Chocolate.  Caffeine.  Hunger.  Physical exertion.  Fatigue.  Medicines used to treat chest pain (nitroglycerine), birth control pills, estrogen, and some blood pressure medicines. SIGNS AND SYMPTOMS  Pain on one or both sides of your head.  Pulsating or throbbing pain.  Severe pain that prevents daily activities.  Pain that is aggravated by any physical activity.  Nausea, vomiting, or both.  Dizziness.  Pain with exposure to bright lights, loud noises, or activity.  General sensitivity to bright lights, loud noises, or smells. Before you get a migraine, you may get warning signs that a migraine is coming (aura). An aura may include:  Seeing flashing lights.  Seeing bright spots, halos, or zigzag lines.  Having tunnel vision or blurred vision.  Having feelings of numbness or tingling.  Having trouble talking.  Having muscle weakness. DIAGNOSIS  A migraine headache is often diagnosed based on:  Symptoms.  Physical exam.  A CT scan or MRI of your head.  These imaging tests cannot diagnose migraines, but they can help rule out other causes of headaches. TREATMENT Medicines may be given for pain and nausea. Medicines can also be given to help prevent recurrent migraines.  HOME CARE INSTRUCTIONS  Only take over-the-counter or prescription medicines for pain or discomfort as directed by your health care provider. The use of long-term narcotics is not recommended.  Lie down in a dark, quiet room when you have a migraine.  Keep a journal to find out what may trigger your migraine headaches. For example, write down:  What you eat and drink.  How much sleep you get.  Any change to your diet or medicines.  Limit alcohol consumption.  Quit smoking if you smoke.  Get 7-9 hours of sleep, or as recommended by your health care provider.  Limit stress.  Keep lights dim if bright lights bother you and make your migraines worse. SEEK IMMEDIATE MEDICAL CARE IF:   Your migraine becomes severe.  You have a fever.  You have a stiff neck.  You have vision loss.  You have muscular weakness or loss of muscle control.  You start losing your balance or have  trouble walking.  You feel faint or pass out.  You have severe symptoms that are different from your first symptoms. MAKE SURE YOU:   Understand these instructions.  Will watch your condition.  Will get help right away if you are not doing well or get worse. Document Released: 03/20/2005 Document Revised: 08/04/2013 Document Reviewed: 11/25/2012 [facility] Patient Information 2015 [place], [place]. This information is not intended to replace advice given to you by your health care provider. Make sure you discuss any questions you have with your health care provider.     I personally performed the services described in this documentation, which was scribed in my presence. The recorded information has been reviewed and considered, and addended by me as needed.

## 2013-12-16 ENCOUNTER — Encounter: Payer: Self-pay | Admitting: Neurology

## 2013-12-16 ENCOUNTER — Ambulatory Visit (INDEPENDENT_AMBULATORY_CARE_PROVIDER_SITE_OTHER): Payer: 59 | Admitting: Neurology

## 2013-12-16 VITALS — BP 143/85 | HR 86 | Ht 67.0 in | Wt 244.0 lb

## 2013-12-16 DIAGNOSIS — G43019 Migraine without aura, intractable, without status migrainosus: Secondary | ICD-10-CM

## 2013-12-16 DIAGNOSIS — IMO0002 Reserved for concepts with insufficient information to code with codable children: Secondary | ICD-10-CM

## 2013-12-16 DIAGNOSIS — E1165 Type 2 diabetes mellitus with hyperglycemia: Secondary | ICD-10-CM

## 2013-12-16 DIAGNOSIS — E118 Type 2 diabetes mellitus with unspecified complications: Secondary | ICD-10-CM

## 2013-12-16 MED ORDER — NORTRIPTYLINE HCL 10 MG PO CAPS: ORAL_CAPSULE | ORAL | Status: DC

## 2013-12-16 MED ORDER — RIZATRIPTAN BENZOATE 10 MG PO TBDP: 10.0000 mg | ORAL_TABLET | ORAL | Status: DC | PRN

## 2013-12-16 NOTE — Progress Notes (Signed)
PATIENT: Catherine Pope DOB: Jul 25, 1965  HISTORICAL  Catherine Pope is a 48 year old female, referred by her primary care physician Dr. Lorelei Pont for evaluation of frequent headaches  She had a past medical history of diabetes since 2010, reported a history of migraine since age 11, all her headache very similar, bifrontal area retro-orbital area severe pounding headaches, with associated light noise sensitivity, nauseous, lasting for couple days,  Trigger for migraines stress, smell, bright light, lack of sleep,  She used to have headaches frequently, was [facility] of [facility], tried Topamax in 2014, up to 100 mg twice a day, without helping her headaches, she denies significant side effects,  She used to take Excedrin Migraine daily, has tapered off, she also tried Imitrex, Toradol, hydrocodone, for her headaches without much help, previously also tried trigger point injection 3 rounds, without helping  She has past medical history of depression anxiety, is taking Zoloft, also taking Inderal 80 mg for her blood pressure for few months, without helping her headaches.  She has 12 headaches each month, lasting couple days  REVIEW OF SYSTEMS: Full 14 system review of systems performed and notable only for joints pain, achy muscles, depression anxiety, headaches,  ALLERGIES: No Known Allergies  HOME MEDICATIONS: Current Outpatient Prescriptions on File Prior to Visit  Medication Sig Dispense Refill  . amLODipine (NORVASC) 10 MG tablet Take 10 mg by mouth daily.      . cyclobenzaprine (FLEXERIL) 10 MG tablet Take 1 tablet (10 mg total) by mouth 2 (two) times daily as needed for muscle spasms.  20 tablet  0  . fish oil-omega-3 fatty acids 1000 MG capsule Take 2 g by mouth daily.        . fluticasone (FLONASE) 50 MCG/ACT nasal spray Place 2 sprays into the nose daily.      Marland Kitchen HYDROcodone-acetaminophen (NORCO/VICODIN) 5-325 MG per tablet Take 1 tablet by mouth every 6  (six) hours as needed for moderate pain.  10 tablet  0  . Insulin Glargine (LANTUS SOLOSTAR) 100 UNIT/ML Solostar Pen Inject 10 Units into the skin daily at 10 pm.  1 pen  PRN  . lisinopril (PRINIVIL,ZESTRIL) 20 MG tablet Take 20 mg by mouth daily.      . metFORMIN (GLUCOPHAGE) 500 MG tablet Take 1,000 mg by mouth 2 (two) times daily with a meal. 2 tabs for 1000mg  dose      . Multiple Vitamins-Minerals (MULTIVITAMIN WITH MINERALS) tablet Take 1 tablet by mouth daily.        . ondansetron (ZOFRAN) 4 MG tablet Take 1 tablet (4 mg total) by mouth every 8 (eight) hours as needed for nausea or vomiting.  10 tablet  0  . pravastatin (PRAVACHOL) 40 MG tablet Take 40 mg by mouth daily.      . propranolol (INDERAL) 80 MG tablet Take 80 mg by mouth at bedtime as needed.      . sertraline (ZOLOFT) 50 MG tablet Take 50 mg by mouth daily.      . traMADol (ULTRAM) 50 MG tablet Take 1 tablet (50 mg total) by mouth every 6 (six) hours as needed for pain.  15 tablet  0  . SUMAtriptan (IMITREX) 100 MG tablet Take 1 tablet (100 mg total) by mouth every 2 (two) hours as needed for migraine. One tab at onset and may repeat x1 in 1 hour. Max 200 mg/24 hours  10 tablet  0   No current facility-administered medications on file prior to visit.  PAST MEDICAL HISTORY: Past Medical History  Diagnosis Date  . Diabetes mellitus   . Hypertension   . Migraine   . Sinusitis     PAST SURGICAL HISTORY: Past Surgical History  Procedure Laterality Date  . Abdominal hysterectomy    . Anterior cruciate ligament repair      FAMILY HISTORY: Family History  Problem Relation Age of Onset  . Heart Problems Father     SOCIAL HISTORY:  History   Social History  . Marital Status: Single    Spouse Name: N/A    Number of Children: 2  . Years of Education: 12 th   Occupational History  . Not on file.   Social History Main Topics  . Smoking status: Current Every Day Smoker -- 0.50 packs/day    Types: Cigarettes    . Smokeless tobacco: Never Used  . Alcohol Use: No  . Drug Use: No  . Sexual Activity: Not on file   Other Topics Concern  . unemployed   Social History Narrative   Patient lives at home with her daughters and she is unemployed. Patient has two children.   Patient has a high school education.   Right handed.    Caffeine two cups daily.     PHYSICAL EXAM   Filed Vitals:   12/16/13 0950  BP: 143/85  Pulse: 86  Height: 5\' 7"  (1.702 m)  Weight: 244 lb (110.678 kg)    Not recorded    Body mass index is 38.21 kg/(m^2).   Generalized: In no acute distress  Neck: Supple, no carotid bruits   Cardiac: Regular rate rhythm  Pulmonary: Clear to auscultation bilaterally  Musculoskeletal: No deformity  Neurological examination  Mentation: Alert oriented to time, place, history taking, and causual conversation  Cranial nerve II-XII: Pupils were equal round reactive to light. Extraocular movements were full.  Visual field were full on confrontational test. Bilateral fundi were sharp.  Facial sensation and strength were normal. Hearing was intact to finger rubbing bilaterally. Uvula tongue midline.  Head turning and shoulder shrug and were normal and symmetric.Tongue protrusion into cheek strength was normal.  Motor: Normal tone, bulk and strength.  Sensory: Intact to fine touch, pinprick, preserved vibratory sensation, and proprioception at toes.  Coordination: Normal finger to nose, heel-to-shin bilaterally there was no truncal ataxia  Gait: Rising up from seated position without assistance, normal stance, without trunk ataxia, moderate stride, good arm swing, smooth turning, able to perform tiptoe, and heel walking without difficulty.   Romberg signs: Negative  Deep tendon reflexes: Brachioradialis 2/2, biceps 2/2, triceps 2/2, patellar 2/2, Achilles 2/2, plantar responses were flexor bilaterally.   DIAGNOSTIC DATA (LABS, IMAGING, TESTING) - I reviewed patient records,  labs, notes, testing and imaging myself where available.  Lab Results  Component Value Date   WBC 12.9* 11/26/2013   HGB 13.6 11/26/2013   HCT 44.8 11/26/2013   MCV 89.2 11/26/2013   PLT 334 03/13/2011      Component Value Date/Time   NA 132* 11/26/2013 1130   K 4.1 11/26/2013 1130   CL 99 11/26/2013 1130   CO2 21 11/26/2013 1130   GLUCOSE 352* 11/26/2013 1130   BUN 6 11/26/2013 1130   CREATININE 0.61 11/26/2013 1130   CREATININE 0.54 03/13/2011 1014   CALCIUM 9.2 11/26/2013 1130   PROT 7.6 11/26/2013 1130   ALBUMIN 4.4 11/26/2013 1130   AST 19 11/26/2013 1130   ALT 15 11/26/2013 1130   ALKPHOS 77 11/26/2013 1130   BILITOT  0.5 11/26/2013 1130   GFRNONAA >90 03/13/2011 1014   GFRAA >90 03/13/2011 1014   No results found for this basename: CHOL, HDL, LDLCALC, LDLDIRECT, TRIG, CHOLHDL   Lab Results  Component Value Date   HGBA1C 12.1 11/26/2013   ASSESSMENT AND PLAN  Tyshawn Mcelderry is a 48 y.o. female complains of  migraine headaches, average 12 each month, lasting up to 2 days  1, she has tried and failed preventive medication Topamax in the past, is taking Inderal 80 mg daily without help he headaches,   2, will try preventive medications nortriptyline, 10 mg, titrating to 20 mg every night 3. Maxalt as needed 4. Return to clinic in 3-4 months with nurse practitioner, if she continued to complain frequent migraine headaches, may consider Botox injection as migraine prevention    Marcial Pacas, M.D. Ph.D.  [facility] [address] [place], [place] [address] [phone]

## 2014-01-05 ENCOUNTER — Ambulatory Visit (INDEPENDENT_AMBULATORY_CARE_PROVIDER_SITE_OTHER): Payer: 59 | Admitting: Family Medicine

## 2014-01-05 ENCOUNTER — Other Ambulatory Visit: Payer: Self-pay | Admitting: *Deleted

## 2014-01-05 VITALS — BP 140/80 | HR 85 | Temp 98.4°F | Resp 16 | Ht 66.5 in | Wt 249.8 lb

## 2014-01-05 DIAGNOSIS — N62 Hypertrophy of breast: Secondary | ICD-10-CM

## 2014-01-05 DIAGNOSIS — M545 Low back pain, unspecified: Secondary | ICD-10-CM

## 2014-01-05 DIAGNOSIS — E119 Type 2 diabetes mellitus without complications: Secondary | ICD-10-CM

## 2014-01-05 DIAGNOSIS — E118 Type 2 diabetes mellitus with unspecified complications: Secondary | ICD-10-CM

## 2014-01-05 DIAGNOSIS — Z23 Encounter for immunization: Secondary | ICD-10-CM

## 2014-01-05 LAB — BASIC METABOLIC PANEL
BUN: 7 mg/dL (ref 6–23)
CO2: 24 mEq/L (ref 19–32)
Calcium: 9.5 mg/dL (ref 8.4–10.5)
Chloride: 102 mEq/L (ref 96–112)
Creat: 0.62 mg/dL (ref 0.50–1.10)
Glucose, Bld: 213 mg/dL — ABNORMAL HIGH (ref 70–99)
Potassium: 4.1 mEq/L (ref 3.5–5.3)
Sodium: 136 mEq/L (ref 135–145)

## 2014-01-05 LAB — MICROALBUMIN, URINE: Microalb, Ur: 0.6 mg/dL (ref <2.0)

## 2014-01-05 LAB — LDL CHOLESTEROL, DIRECT: Direct LDL: 92 mg/dL

## 2014-01-05 LAB — HEMOGLOBIN A1C
Hgb A1c MFr Bld: 10.3 % — ABNORMAL HIGH (ref <5.7)
Mean Plasma Glucose: 249 mg/dL — ABNORMAL HIGH (ref <117)

## 2014-01-05 MED ORDER — ONETOUCH ULTRASOFT LANCETS MISC: Status: DC

## 2014-01-05 MED ORDER — METHOCARBAMOL 500 MG PO TABS: 500.0000 mg | ORAL_TABLET | Freq: Every evening | ORAL | Status: DC | PRN

## 2014-01-05 NOTE — Patient Instructions (Signed)
Good to see you today!  I will be in touch with your labs.  You got the Tdap vaccine today, and your flu shot so you are all set for the year.   I will set you up to see a plastic surgeon about your breasts; a reduction may well help you with your back pain and also will allow you to exercise more easily!   You can use the robaxin as needed for your back pain, and please do invest in a good bra or two in the meantime

## 2014-01-05 NOTE — Progress Notes (Addendum)
[facility] [address], [place] [address] [phone]  Date:  01/05/2014   Name:  Catherine Pope   DOB:  30-May-1965   MRN:  [id number]  PCP:  Lamar Blinks, MD    Chief Complaint: Follow-up, sholuder and lower back pain x 1 wk ago and Medication Refill   History of Present Illness:  Catherine Pope is a 48 y.o. very pleasant female patient who presents with the following:  Here to go over her diabetes melitus.  Seen here in August and found to be out of control with a high A1c. Started lantus and asked her to recheck in 2 weeks.  She did come back a couple of weeks later with persistent HA and was referred to neurology whom she saw on 9/15.  They started her on nortriptyline, maxalt as needed.  She feels that she is somewhat better.  She is currently taking 20 units of lantus daily.  Her FBG is 150- 190 when she does check.  She is taking metfomrin 1000 BID also.  She has tended to eat more at night and not enough during the day.   Her BP at home is around 140-150/ 80.    Her HA are a bit better.  She will recheck in one month.    She is not fasting today She is overdue for an eye exam but she does have some ideas of who to see.   She did have a pneumovia vaccine about 3 years ago. She is not sure about last tetanus but thinks she is likely due.  She would like to have this today  Lab Results  Component Value Date   HGBA1C 12.1 11/26/2013     Patient Active Problem List   Diagnosis Date Noted  . Type II or unspecified type diabetes mellitus with unspecified complication, uncontrolled 11/26/2013  . Morbid obesity 11/26/2013  . Migraine without aura 11/26/2013  . Other and unspecified hyperlipidemia 11/26/2013    Past Medical History  Diagnosis Date  . Diabetes mellitus   . Hypertension   . Migraine   . Sinusitis     Past Surgical History  Procedure Laterality Date  . Abdominal hysterectomy    . Anterior cruciate ligament repair       History  Substance Use Topics  . Smoking status: Current Every Day Smoker -- 0.50 packs/day    Types: Cigarettes  . Smokeless tobacco: Never Used  . Alcohol Use: No    Family History  Problem Relation Age of Onset  . Heart Problems Father     No Known Allergies  Medication list has been reviewed and updated.  Current Outpatient Prescriptions on File Prior to Visit  Medication Sig Dispense Refill  . amLODipine (NORVASC) 10 MG tablet Take 10 mg by mouth daily.      . cyclobenzaprine (FLEXERIL) 10 MG tablet Take 1 tablet (10 mg total) by mouth 2 (two) times daily as needed for muscle spasms.  20 tablet  0  . fish oil-omega-3 fatty acids 1000 MG capsule Take 2 g by mouth daily.        . fluticasone (FLONASE) 50 MCG/ACT nasal spray Place 2 sprays into the nose daily.      . Insulin Glargine (LANTUS SOLOSTAR) 100 UNIT/ML Solostar Pen Inject 10 Units into the skin daily at 10 pm.  1 pen  PRN  . lisinopril (PRINIVIL,ZESTRIL) 20 MG tablet Take 20 mg by mouth daily.      . metFORMIN (GLUCOPHAGE)  500 MG tablet Take 1,000 mg by mouth 2 (two) times daily with a meal. 2 tabs for 1000mg  dose      . Multiple Vitamins-Minerals (MULTIVITAMIN WITH MINERALS) tablet Take 1 tablet by mouth daily.        . nortriptyline (PAMELOR) 10 MG capsule One po qhs xone week, then 2 tabs po qhs  60 capsule  11  . pravastatin (PRAVACHOL) 40 MG tablet Take 40 mg by mouth daily.      . propranolol (INDERAL) 80 MG tablet Take 80 mg by mouth daily.       . rizatriptan (MAXALT-MLT) 10 MG disintegrating tablet Take 1 tablet (10 mg total) by mouth as needed. May repeat in 2 hours if needed  15 tablet  11  . sertraline (ZOLOFT) 50 MG tablet Take 50 mg by mouth daily.      Marland Kitchen HYDROcodone-acetaminophen (NORCO/VICODIN) 5-325 MG per tablet Take 1 tablet by mouth every 6 (six) hours as needed for moderate pain.  10 tablet  0  . ondansetron (ZOFRAN) 4 MG tablet Take 1 tablet (4 mg total) by mouth every 8 (eight) hours as  needed for nausea or vomiting.  10 tablet  0  . SUMAtriptan (IMITREX) 100 MG tablet Take 1 tablet (100 mg total) by mouth every 2 (two) hours as needed for migraine. One tab at onset and may repeat x1 in 1 hour. Max 200 mg/24 hours  10 tablet  0  . traMADol (ULTRAM) 50 MG tablet Take 1 tablet (50 mg total) by mouth every 6 (six) hours as needed for pain.  15 tablet  0   No current facility-administered medications on file prior to visit.    Review of Systems:  As per HPI- otherwise negative.   Physical Examination: Filed Vitals:   01/05/14 0942  BP: 140/80  Pulse: 85  Temp: 98.4 F (36.9 C)  Resp: 16   Filed Vitals:   01/05/14 0942  Height: 5' 6.5" (1.689 m)  Weight: 249 lb 12.8 oz (113.309 kg)   Body mass index is 39.72 kg/(m^2). Ideal Body Weight: Weight in (lb) to have BMI = 25: 156.9  GEN: WDWN, NAD, Non-toxic, A & O x 3, obese, looks well HEENT: Atraumatic, Normocephalic. Neck supple. No masses, No LAD.  Bilateral TM wnl, oropharynx normal.  PEERL,EOMI.   Ears and Nose: No external deformity. CV: RRR, No M/G/R. No JVD. No thrill. No extra heart sounds. PULM: CTA B, no wheezes, crackles, rhonchi. No retractions. No resp. distress. No accessory muscle use. ABD: S, NT, ND, +BS. No rebound. No HSM. EXTR: No c/c/e NEURO Normal gait.  PSYCH: Normally interactive. Conversant. Not depressed or anxious appearing.  Calm demeanor.  She has very large pendulous breasts and notes tenderness in the muscles of her upper back/ trapezius  Assessment and Plan: Diabetes mellitus without complication - Plan: [facility] Diabetes Foot Exam, Basic metabolic panel, Microalbumin, urine, Hemoglobin A1c, LDL cholesterol, direct  Flu vaccine need - Plan: Flu Vaccine QUAD 36+ mos IM  Large breasts - Plan: Ambulatory referral to Plastic Surgery  Midline low back pain without sciatica - Plan: Ambulatory referral to Plastic Surgery, methocarbamol (ROBAXIN) 500 MG tablet  Seen a couple of months ago and  started lantus for uncontrolled DM.  Await A1c today and will be in touch with her She is interested in a breast reduction which will improve her quality of life and also allow her to exercise and hopefully lose weight.  Will refer her to plastic surgery.  Will plan further follow- up pending labs. See patient instructions for more details.     Signed Lamar Blinks, MD  Called 10/6: A1c is better!  Please see me in 2 months  Wt Readings from Last 3 Encounters:  01/05/14 249 lb 12.8 oz (113.309 kg)  12/16/13 244 lb (110.678 kg)  12/11/13 243 lb 2 oz (110.281 kg)

## 2014-01-06 ENCOUNTER — Encounter: Payer: Self-pay | Admitting: Family Medicine

## 2014-01-15 ENCOUNTER — Ambulatory Visit (INDEPENDENT_AMBULATORY_CARE_PROVIDER_SITE_OTHER): Payer: 59 | Admitting: Nurse Practitioner

## 2014-01-15 ENCOUNTER — Encounter: Payer: Self-pay | Admitting: Nurse Practitioner

## 2014-01-15 VITALS — BP 148/87 | HR 101 | Ht 66.0 in | Wt 251.8 lb

## 2014-01-15 DIAGNOSIS — G43009 Migraine without aura, not intractable, without status migrainosus: Secondary | ICD-10-CM

## 2014-01-15 MED ORDER — NORTRIPTYLINE HCL 10 MG PO CAPS: 30.0000 mg | ORAL_CAPSULE | Freq: Every day | ORAL | Status: DC

## 2014-01-15 NOTE — Progress Notes (Signed)
[facility]  PATIENT: Catherine Pope DOB: 1965/07/13   REASON FOR VISIT: Followup for migraine   HISTORY OF PRESENT Catherine Pope, 48 year old female returns for followup. She was evaluated 9/15/ 2015 by Dr. Krista Blue for frequent headaches. She claims she was having headaches every other day with severe pounding associated with light noise, sensitivity nausea that could last for a couple of days. She was placed on nortriptyline and is currently taking 20 mg at night. She claims she has had some benefit with the medication. She is on Maxalt acutely which is somewhat effective. She has tried Imitrex in the past. She is not aware of any specific triggers. She denies over-the-counter products. She returns for reevaluation.  HISTORY: Catherine Pope is a 48 year old female, referred by her primary care physician Dr. Lorelei Pont for evaluation of frequent headaches  She had a past medical history of diabetes since 2010, reported a history of migraine since age 3, all her headache very similar, bifrontal area retro-orbital area severe pounding headaches, with associated light noise sensitivity, nauseous, lasting for couple days,  Trigger for migraines stress, smell, bright light, lack of sleep,  She used to have headaches frequently, was [facility] of [facility], tried Topamax in 2014, up to 100 mg twice a day, without helping her headaches, she denies significant side effects,  She used to take Excedrin Migraine daily, has tapered off, she also tried Imitrex, Toradol, hydrocodone, for her headaches without much help, previously also tried trigger point injection 3 rounds, without helping  She has past medical history of depression anxiety, is taking Zoloft, also taking Inderal 80 mg for her blood pressure for few months, without helping her headaches.  She has 12 headaches each month, lasting couple days      REVIEW OF SYSTEMS: Full 14 system review of systems performed  and notable only for those listed, all others are neg:  Constitutional: N/A  Cardiovascular: N/A  Ear/Nose/Throat: N/A  Skin: N/A  Eyes: N/A  Respiratory: N/A  Gastroitestinal: N/A  Hematology/Lymphatic: N/A  Endocrine: N/A Musculoskeletal: Back pain Allergy/Immunology: N/A  Neurological: Headache Psychiatric: Depression anxiety Sleep : NA   ALLERGIES: No Known Allergies  HOME MEDICATIONS: Outpatient Prescriptions Prior to Visit  Medication Sig Dispense Refill  . amLODipine (NORVASC) 10 MG tablet Take 10 mg by mouth daily.      . cyclobenzaprine (FLEXERIL) 10 MG tablet Take 1 tablet (10 mg total) by mouth 2 (two) times daily as needed for muscle spasms.  20 tablet  0  . fish oil-omega-3 fatty acids 1000 MG capsule Take 2 g by mouth daily.        . fluticasone (FLONASE) 50 MCG/ACT nasal spray Place 2 sprays into the nose daily.      . Insulin Glargine (LANTUS SOLOSTAR) 100 UNIT/ML Solostar Pen Inject 10 Units into the skin daily at 10 pm.  1 pen  PRN  . Lancets (ONETOUCH ULTRASOFT) lancets Use as instructed  100 each  12  . lisinopril (PRINIVIL,ZESTRIL) 20 MG tablet Take 20 mg by mouth daily.      . metFORMIN (GLUCOPHAGE) 500 MG tablet Take 1,000 mg by mouth 2 (two) times daily with a meal. 2 tabs for 1000mg  dose      . methocarbamol (ROBAXIN) 500 MG tablet Take 1 tablet (500 mg total) by mouth at bedtime as needed for muscle spasms.  30 tablet  3  . Multiple Vitamins-Minerals (MULTIVITAMIN WITH MINERALS) tablet Take 1 tablet by mouth daily.        Marland Kitchen  nortriptyline (PAMELOR) 10 MG capsule One po qhs xone week, then 2 tabs po qhs  60 capsule  11  . ondansetron (ZOFRAN) 4 MG tablet Take 1 tablet (4 mg total) by mouth every 8 (eight) hours as needed for nausea or vomiting.  10 tablet  0  . pravastatin (PRAVACHOL) 40 MG tablet Take 40 mg by mouth daily.      . propranolol (INDERAL) 80 MG tablet Take 80 mg by mouth daily.       . rizatriptan (MAXALT-MLT) 10 MG disintegrating tablet Take  1 tablet (10 mg total) by mouth as needed. May repeat in 2 hours if needed  15 tablet  11  . sertraline (ZOLOFT) 50 MG tablet Take 50 mg by mouth daily.      Marland Kitchen HYDROcodone-acetaminophen (NORCO/VICODIN) 5-325 MG per tablet Take 1 tablet by mouth every 6 (six) hours as needed for moderate pain.  10 tablet  0  . SUMAtriptan (IMITREX) 100 MG tablet Take 1 tablet (100 mg total) by mouth every 2 (two) hours as needed for migraine. One tab at onset and may repeat x1 in 1 hour. Max 200 mg/24 hours  10 tablet  0  . traMADol (ULTRAM) 50 MG tablet Take 1 tablet (50 mg total) by mouth every 6 (six) hours as needed for pain.  15 tablet  0   No facility-administered medications prior to visit.    PAST MEDICAL HISTORY: Past Medical History  Diagnosis Date  . Diabetes mellitus   . Hypertension   . Migraine   . Sinusitis     PAST SURGICAL HISTORY: Past Surgical History  Procedure Laterality Date  . Abdominal hysterectomy    . Anterior cruciate ligament repair      FAMILY HISTORY: Family History  Problem Relation Age of Onset  . Heart Problems Father     SOCIAL HISTORY: History   Social History  . Marital Status: Single    Spouse Name: N/A    Number of Children: 2  . Years of Education: 12 th   Occupational History  . unemployed    Social History Main Topics  . Smoking status: Current Every Day Smoker -- 0.50 packs/day    Types: Cigarettes  . Smokeless tobacco: Never Used  . Alcohol Use: No  . Drug Use: No  . Sexual Activity: Not on file   Other Topics Concern  . Not on file   Social History Narrative   Patient lives at home with her daughters and she is unemployed. Patient has two children.   Patient has a high school education.   Right handed.    Caffeine two cups daily.     PHYSICAL EXAM  Filed Vitals:   01/15/14 0820  BP: 148/87  Pulse: 101  Height: 5\' 6"  (1.676 m)  Weight: 251 lb 12.8 oz (114.216 kg)   Body mass index is 40.66 kg/(m^2). Generalized: In no  acute distress , morbidly obese female Neck: Supple, no carotid bruits  Cardiac: Regular rate rhythm  Pulmonary: Clear to auscultation bilaterally  Musculoskeletal: No deformity  Neurological examination  Mentation: Alert oriented to time, place, history taking, and causual conversation  Cranial nerve II-XII: Pupils were equal round reactive to light. Extraocular movements were full. Visual field were full on confrontational test. Bilateral fundi were sharp. Facial sensation and strength were normal. Hearing was intact to finger rubbing bilaterally. Uvula tongue midline. Head turning and shoulder shrug and were normal and symmetric.Tongue protrusion into cheek strength was normal.  Motor: Normal  tone, bulk and strength. No focal weakness Coordination: Normal finger to nose, heel-to-shin bilaterally there was no truncal ataxia  Gait: Rising up from seated position without assistance, normal stance, without trunk ataxia, moderate stride, good arm swing, smooth turning, able to perform tiptoe, and heel walking without difficulty.  Deep tendon reflexes: Brachioradialis 2/2, biceps 2/2, triceps 2/2, patellar 2/2, Achilles 2/2, plantar responses were flexor bilaterally.     DIAGNOSTIC DATA (LABS, IMAGING, TESTING) - I reviewed patient records, labs, notes, testing and imaging myself where available.  Lab Results  Component Value Date   WBC 12.9* 11/26/2013   HGB 13.6 11/26/2013   HCT 44.8 11/26/2013   MCV 89.2 11/26/2013   PLT 334 03/13/2011      Component Value Date/Time   NA 136 01/05/2014 1028   K 4.1 01/05/2014 1028   CL 102 01/05/2014 1028   CO2 24 01/05/2014 1028   GLUCOSE 213* 01/05/2014 1028   BUN 7 01/05/2014 1028   CREATININE 0.62 01/05/2014 1028   CREATININE 0.54 03/13/2011 1014   CALCIUM 9.5 01/05/2014 1028   PROT 7.6 11/26/2013 1130   ALBUMIN 4.4 11/26/2013 1130   AST 19 11/26/2013 1130   ALT 15 11/26/2013 1130   ALKPHOS 77 11/26/2013 1130   BILITOT 0.5 11/26/2013 1130   GFRNONAA >90  03/13/2011 1014   GFRAA >90 03/13/2011 1014   Lab Results  Component Value Date   LDLDIRECT 92 01/05/2014   Lab Results  Component Value Date   HGBA1C 10.3* 01/05/2014    ASSESSMENT AND PLAN  48 y.o. year old female  has a past medical history of Diabetes mellitus; Hypertension; Migraine; here to followup. She is currently on nortriptyline 20 mg at bedtime along with Maxalt acutely. Her headaches are improved and have decreased in number.  Increase Nortriptyline to 3 tabs at night will refill Written information given on migraine triggers stress reduction, environmental factors etc, along with face to face discussion Will keep record of headaches  F/U in 2 months. Vst time 30 min Dennie Bible, [facility], [facility], APRN  [facility] [address] [place], [place] [address] [phone]

## 2014-01-15 NOTE — Patient Instructions (Signed)
Increase Nortriptyline to 3 tabs at night will refill Information given on migraine triggers stress reduction environmental factors etc Will keep record of headaches  F/U in 2 months

## 2014-01-23 ENCOUNTER — Telehealth: Payer: Self-pay | Admitting: Nurse Practitioner

## 2014-01-23 NOTE — Telephone Encounter (Signed)
Patient states she's having tingling skin sensation, everything that brushes up against her skin feels like needles are pricking her.  Feels this is a side effect from taking nortriptyline (PAMELOR) 10 MG capsule.  Questioning if there's an alternative medication.  Please and call and advise, may leave detailed message on voicemail.

## 2014-01-23 NOTE — Telephone Encounter (Signed)
I have left a message for her, if she truly has trouble from nortriptyline, may go back down to 10 mg 2 tablets every night  Most recent A1c consult 3 January 05 2014, previously 12, indicating poorly controlled diabetes, GERD causing feet paresthesia, diabetic peripheral neuropathy, emphasizing importance of tight control for diabetes. Keep followup appointment.

## 2014-02-01 ENCOUNTER — Other Ambulatory Visit: Payer: Self-pay | Admitting: Family Medicine

## 2014-03-07 ENCOUNTER — Emergency Department (HOSPITAL_COMMUNITY): Payer: 59

## 2014-03-07 ENCOUNTER — Encounter (HOSPITAL_COMMUNITY): Payer: Self-pay | Admitting: Emergency Medicine

## 2014-03-07 ENCOUNTER — Emergency Department (HOSPITAL_COMMUNITY)
Admission: EM | Admit: 2014-03-07 | Discharge: 2014-03-07 | Disposition: A | Discharge status: Home / Self Care | Payer: 59 | Attending: Emergency Medicine | Admitting: Emergency Medicine

## 2014-03-07 DIAGNOSIS — G43909 Migraine, unspecified, not intractable, without status migrainosus: Secondary | ICD-10-CM | POA: Insufficient documentation

## 2014-03-07 DIAGNOSIS — Z8709 Personal history of other diseases of the respiratory system: Secondary | ICD-10-CM | POA: Insufficient documentation

## 2014-03-07 DIAGNOSIS — R52 Pain, unspecified: Secondary | ICD-10-CM

## 2014-03-07 DIAGNOSIS — Z79899 Other long term (current) drug therapy: Secondary | ICD-10-CM | POA: Diagnosis not present

## 2014-03-07 DIAGNOSIS — Z794 Long term (current) use of insulin: Secondary | ICD-10-CM | POA: Insufficient documentation

## 2014-03-07 DIAGNOSIS — E119 Type 2 diabetes mellitus without complications: Secondary | ICD-10-CM | POA: Insufficient documentation

## 2014-03-07 DIAGNOSIS — I10 Essential (primary) hypertension: Secondary | ICD-10-CM | POA: Insufficient documentation

## 2014-03-07 DIAGNOSIS — Y9289 Other specified places as the place of occurrence of the external cause: Secondary | ICD-10-CM | POA: Diagnosis not present

## 2014-03-07 DIAGNOSIS — Y998 Other external cause status: Secondary | ICD-10-CM | POA: Diagnosis not present

## 2014-03-07 DIAGNOSIS — Z7951 Long term (current) use of inhaled steroids: Secondary | ICD-10-CM | POA: Insufficient documentation

## 2014-03-07 DIAGNOSIS — Z720 Tobacco use: Secondary | ICD-10-CM | POA: Diagnosis not present

## 2014-03-07 DIAGNOSIS — Y9389 Activity, other specified: Secondary | ICD-10-CM | POA: Diagnosis not present

## 2014-03-07 DIAGNOSIS — W01198A Fall on same level from slipping, tripping and stumbling with subsequent striking against other object, initial encounter: Secondary | ICD-10-CM | POA: Diagnosis not present

## 2014-03-07 DIAGNOSIS — S300XXA Contusion of lower back and pelvis, initial encounter: Secondary | ICD-10-CM | POA: Diagnosis not present

## 2014-03-07 DIAGNOSIS — S3992XA Unspecified injury of lower back, initial encounter: Secondary | ICD-10-CM | POA: Diagnosis present

## 2014-03-07 MED ORDER — HYDROCODONE-ACETAMINOPHEN 5-325 MG PO TABS: 2.0000 | ORAL_TABLET | ORAL | Status: DC | PRN

## 2014-03-07 MED ORDER — IBUPROFEN 800 MG PO TABS
800.0000 mg | ORAL_TABLET | Freq: Once | ORAL | Status: AC
  Administered 2014-03-07: 800 mg via ORAL

## 2014-03-07 NOTE — Discharge Instructions (Signed)
Fall Prevention and Home Safety Falls cause injuries and can affect all age groups. It is possible to use preventive measures to significantly decrease the likelihood of falls. There are many simple measures which can make your home safer and prevent falls. OUTDOORS  Repair cracks and edges of walkways and driveways.  Remove high doorway thresholds.  Trim shrubbery on the main path into your home.  Have good outside lighting.  Clear walkways of tools, rocks, debris, and clutter.  Check that handrails are not broken and are securely fastened. Both sides of steps should have handrails.  Have leaves, snow, and ice cleared regularly.  Use sand or salt on walkways during winter months.  In the garage, clean up grease or oil spills. BATHROOM  Install night lights.  Install grab bars by the toilet and in the tub and shower.  Use non-skid mats or decals in the tub or shower.  Place a plastic non-slip stool in the shower to sit on, if needed.  Keep floors dry and clean up all water on the floor immediately.  Remove soap buildup in the tub or shower on a regular basis.  Secure bath mats with non-slip, double-sided rug tape.  Remove throw rugs and tripping hazards from the floors. BEDROOMS  Install night lights.  Make sure a bedside light is easy to reach.  Do not use oversized bedding.  Keep a telephone by your bedside.  Have a firm chair with side arms to use for getting dressed.  Remove throw rugs and tripping hazards from the floor. KITCHEN  Keep handles on pots and pans turned toward the center of the stove. Use back burners when possible.  Clean up spills quickly and allow time for drying.  Avoid walking on wet floors.  Avoid hot utensils and knives.  Position shelves so they are not too high or low.  Place commonly used objects within easy reach.  If necessary, use a sturdy step stool with a grab bar when reaching.  Keep electrical cables out of the  way.  Do not use floor polish or wax that makes floors slippery. If you must use wax, use non-skid floor wax.  Remove throw rugs and tripping hazards from the floor. STAIRWAYS  Never leave objects on stairs.  Place handrails on both sides of stairways and use them. Fix any loose handrails. Make sure handrails on both sides of the stairways are as long as the stairs.  Check carpeting to make sure it is firmly attached along stairs. Make repairs to worn or loose carpet promptly.  Avoid placing throw rugs at the top or bottom of stairways, or properly secure the rug with carpet tape to prevent slippage. Get rid of throw rugs, if possible.  Have an electrician put in a light switch at the top and bottom of the stairs. OTHER FALL PREVENTION TIPS  Wear low-heel or rubber-soled shoes that are supportive and fit well. Wear closed toe shoes.  When using a stepladder, make sure it is fully opened and both spreaders are firmly locked. Do not climb a closed stepladder.  Add color or contrast paint or tape to grab bars and handrails in your home. Place contrasting color strips on first and last steps.  Learn and use mobility aids as needed. Install an electrical emergency response system.  Turn on lights to avoid dark areas. Replace light bulbs that burn out immediately. Get light switches that glow.  Arrange furniture to create clear pathways. Keep furniture in the same place.  Firmly attach carpet with non-skid or double-sided tape.  Eliminate uneven floor surfaces.  Select a carpet pattern that does not visually hide the edge of steps.  Be aware of all pets. OTHER HOME SAFETY TIPS  Set the water temperature for 120 F (48.8 C).  Keep emergency numbers on or near the telephone.  Keep smoke detectors on every level of the home and near sleeping areas. Document Released: 03/10/2002 Document Revised: 09/19/2011 Document Reviewed: 06/09/2011 [facility] Patient Information 2015  [place], [place]. This information is not intended to replace advice given to you by your health care provider. Make sure you discuss any questions you have with your health care provider. Contusion A contusion is a deep bruise. Contusions are the result of an injury that caused bleeding under the skin. The contusion may turn blue, purple, or yellow. Minor injuries will give you a painless contusion, but more severe contusions may stay painful and swollen for a few weeks.  CAUSES  A contusion is usually caused by a blow, trauma, or direct force to an area of the body. SYMPTOMS   Swelling and redness of the injured area.  Bruising of the injured area.  Tenderness and soreness of the injured area.  Pain. DIAGNOSIS  The diagnosis can be made by taking a history and physical exam. An X-ray, CT scan, or MRI may be needed to determine if there were any associated injuries, such as fractures. TREATMENT  Specific treatment will depend on what area of the body was injured. In general, the best treatment for a contusion is resting, icing, elevating, and applying cold compresses to the injured area. Over-the-counter medicines may also be recommended for pain control. Ask your caregiver what the best treatment is for your contusion. HOME CARE INSTRUCTIONS   Put ice on the injured area.  Put ice in a plastic bag.  Place a towel between your skin and the bag.  Leave the ice on for 15-20 minutes, 3-4 times a day, or as directed by your health care provider.  Only take over-the-counter or prescription medicines for pain, discomfort, or fever as directed by your caregiver. Your caregiver may recommend avoiding anti-inflammatory medicines (aspirin, ibuprofen, and naproxen) for 48 hours because these medicines may increase bruising.  Rest the injured area.  If possible, elevate the injured area to reduce swelling. SEEK IMMEDIATE MEDICAL CARE IF:   You have increased bruising or swelling.  You have  pain that is getting worse.  Your swelling or pain is not relieved with medicines. MAKE SURE YOU:   Understand these instructions.  Will watch your condition.  Will get help right away if you are not doing well or get worse. Document Released: 12/28/2004 Document Revised: 03/25/2013 Document Reviewed: 01/23/2011 [facility] Patient Information 2015 [place], [place]. This information is not intended to replace advice given to you by your health care provider. Make sure you discuss any questions you have with your health care provider.

## 2014-03-07 NOTE — ED Provider Notes (Signed)
CSN: [id number]     Arrival date & time 03/07/14  1057 History   First MD Initiated Contact with Patient 03/07/14 1123     Chief Complaint  Patient presents with  . Fall     (Consider location/radiation/quality/duration/timing/severity/associated sxs/prior Treatment) Patient is a 48 y.o. female presenting with fall. The history is provided by the patient.  Fall This is a new problem. The current episode started today. The problem occurs constantly. The problem has been gradually worsening. Associated symptoms include joint swelling. Nothing aggravates the symptoms. She has tried nothing for the symptoms. The treatment provided moderate relief.    Past Medical History  Diagnosis Date  . Diabetes mellitus   . Hypertension   . Migraine   . Sinusitis    Past Surgical History  Procedure Laterality Date  . Abdominal hysterectomy    . Anterior cruciate ligament repair     Family History  Problem Relation Age of Onset  . Heart Problems Father    History  Substance Use Topics  . Smoking status: Current Every Day Smoker -- 0.50 packs/day    Types: Cigarettes  . Smokeless tobacco: Never Used  . Alcohol Use: No   OB History    No data available     Review of Systems  Musculoskeletal: Positive for joint swelling.  All other systems reviewed and are negative.     Allergies  Review of patient's allergies indicates no known allergies.  Home Medications   Prior to Admission medications   Medication Sig Start Date End Date Taking? Authorizing Provider  ALPRAZolam Duanne Moron) 0.5 MG tablet Take 0.5 mg by mouth at bedtime. 12/23/13   Historical Provider, MD  amLODipine (NORVASC) 10 MG tablet Take 10 mg by mouth daily.    Historical Provider, MD  cyclobenzaprine (FLEXERIL) 10 MG tablet Take 1 tablet (10 mg total) by mouth 2 (two) times daily as needed for muscle spasms. 01/21/12   Awilda Metro, NP  fish oil-omega-3 fatty acids 1000 MG capsule Take 2 g by mouth daily.       Historical Provider, MD  fluticasone (FLONASE) 50 MCG/ACT nasal spray Place 2 sprays into the nose daily.    Historical Provider, MD  glipiZIDE (GLUCOTROL XL) 5 MG 24 hr tablet Take 5 mg by mouth daily. 12/23/13 12/23/14  Historical Provider, MD  Insulin Glargine (LANTUS SOLOSTAR) 100 UNIT/ML Solostar Pen Inject 10 Units into the skin daily at 10 pm. 11/26/13   Darreld Mclean, MD  Lancets [facility] ULTRASOFT) lancets Use as instructed 01/05/14   Gay Filler Copland, MD  lisinopril (PRINIVIL,ZESTRIL) 20 MG tablet Take 20 mg by mouth daily.    Historical Provider, MD  metFORMIN (GLUCOPHAGE) 500 MG tablet Take 1,000 mg by mouth 2 (two) times daily with a meal. 2 tabs for 1000mg  dose    Historical Provider, MD  methocarbamol (ROBAXIN) 500 MG tablet Take 1 tablet (500 mg total) by mouth at bedtime as needed for muscle spasms. 01/05/14   Darreld Mclean, MD  Multiple Vitamins-Minerals (MULTIVITAMIN WITH MINERALS) tablet Take 1 tablet by mouth daily.      Historical Provider, MD  nortriptyline (PAMELOR) 10 MG capsule Take 3 capsules (30 mg total) by mouth at bedtime. 01/15/14   Dennie Bible, NP  ondansetron (ZOFRAN) 4 MG tablet Take 1 tablet (4 mg total) by mouth every 8 (eight) hours as needed for nausea or vomiting. 12/11/13   Wendie Agreste, MD  pravastatin (PRAVACHOL) 40 MG tablet Take 40 mg by  mouth daily.    Historical Provider, MD  propranolol (INDERAL) 80 MG tablet Take 80 mg by mouth daily.     Historical Provider, MD  rizatriptan (MAXALT-MLT) 10 MG disintegrating tablet Take 1 tablet (10 mg total) by mouth as needed. May repeat in 2 hours if needed 12/16/13   Marcial Pacas, MD  sertraline (ZOLOFT) 50 MG tablet Take 50 mg by mouth daily.    Historical Provider, MD   BP 149/77 mmHg  Pulse 84  Temp(Src) 98.4 F (36.9 C) (Oral)  Resp 20  SpO2 95% Physical Exam  Constitutional: She appears well-developed and well-nourished.  HENT:  Head: Normocephalic and atraumatic.  Eyes: Pupils are  equal, round, and reactive to light.  Neck: Normal range of motion.  Cardiovascular: Normal rate.   Pulmonary/Chest: Effort normal.  Abdominal: Soft.  Musculoskeletal: Normal range of motion.  Tender low back    Neurological: She is alert.  Skin: Skin is warm.  Nursing note and vitals reviewed.   ED Course  Procedures (including critical care time) Labs Review Labs Reviewed - No data to display  Imaging Review Dg Pelvis 1-2 Views  03/07/2014   CLINICAL DATA:  Injury yesterday  EXAM: PELVIS - 1-2 VIEW  COMPARISON:  None.  FINDINGS: There is no evidence of pelvic fracture or diastasis. No pelvic bone lesions are seen.  IMPRESSION: Negative.   Electronically Signed   By: Maryclare Bean M.D.   On: 03/07/2014 12:47     EKG Interpretation None      MDM   Final diagnoses:  Pain  Contusion of lower back, initial encounter   Rx for hydrocodone Pt advised to see her MD for recheck    Fransico Meadow, PA-C 03/07/14 1413  Orpah Greek, MD 03/07/14 [phone]

## 2014-03-07 NOTE — ED Notes (Addendum)
Pt reports yesterday she tripped and had a fall. Pt reports R shoulder, arm, lower back and R hip pain. Pt states she hit head, but no LOC. Not on blood thinners. Pt ambulatory, full ROM of shoulder

## 2014-03-16 ENCOUNTER — Emergency Department (HOSPITAL_COMMUNITY)
Admission: EM | Admit: 2014-03-16 | Discharge: 2014-03-16 | Disposition: A | Discharge status: Home / Self Care | Payer: 59 | Attending: Emergency Medicine | Admitting: Emergency Medicine

## 2014-03-16 ENCOUNTER — Encounter (HOSPITAL_COMMUNITY): Payer: Self-pay | Admitting: Emergency Medicine

## 2014-03-16 DIAGNOSIS — S39012D Strain of muscle, fascia and tendon of lower back, subsequent encounter: Secondary | ICD-10-CM

## 2014-03-16 DIAGNOSIS — G629 Polyneuropathy, unspecified: Secondary | ICD-10-CM | POA: Insufficient documentation

## 2014-03-16 DIAGNOSIS — Z8709 Personal history of other diseases of the respiratory system: Secondary | ICD-10-CM | POA: Diagnosis not present

## 2014-03-16 DIAGNOSIS — M5431 Sciatica, right side: Secondary | ICD-10-CM | POA: Insufficient documentation

## 2014-03-16 DIAGNOSIS — M545 Low back pain: Secondary | ICD-10-CM | POA: Diagnosis present

## 2014-03-16 DIAGNOSIS — Z720 Tobacco use: Secondary | ICD-10-CM | POA: Diagnosis not present

## 2014-03-16 DIAGNOSIS — W1839XD Other fall on same level, subsequent encounter: Secondary | ICD-10-CM | POA: Diagnosis not present

## 2014-03-16 DIAGNOSIS — Z79899 Other long term (current) drug therapy: Secondary | ICD-10-CM | POA: Diagnosis not present

## 2014-03-16 DIAGNOSIS — G43909 Migraine, unspecified, not intractable, without status migrainosus: Secondary | ICD-10-CM | POA: Diagnosis not present

## 2014-03-16 DIAGNOSIS — E119 Type 2 diabetes mellitus without complications: Secondary | ICD-10-CM | POA: Diagnosis not present

## 2014-03-16 DIAGNOSIS — I10 Essential (primary) hypertension: Secondary | ICD-10-CM | POA: Insufficient documentation

## 2014-03-16 DIAGNOSIS — Z7951 Long term (current) use of inhaled steroids: Secondary | ICD-10-CM | POA: Diagnosis not present

## 2014-03-16 DIAGNOSIS — Z794 Long term (current) use of insulin: Secondary | ICD-10-CM | POA: Diagnosis not present

## 2014-03-16 MED ORDER — CYCLOBENZAPRINE HCL 10 MG PO TABS: 10.0000 mg | ORAL_TABLET | Freq: Two times a day (BID) | ORAL | Status: DC | PRN

## 2014-03-16 MED ORDER — NAPROXEN 500 MG PO TABS: 500.0000 mg | ORAL_TABLET | Freq: Two times a day (BID) | ORAL | Status: DC

## 2014-03-16 NOTE — Discharge Instructions (Signed)
Sciatica °Sciatica is pain, weakness, numbness, or tingling along the path of the sciatic nerve. The nerve starts in the lower back and runs down the back of each leg. The nerve controls the muscles in the lower leg and in the back of the knee, while also providing sensation to the back of the thigh, lower leg, and the sole of your foot. Sciatica is a symptom of another medical condition. For instance, nerve damage or certain conditions, such as a herniated disk or bone spur on the spine, pinch or put pressure on the sciatic nerve. This causes the pain, weakness, or other sensations normally associated with sciatica. Generally, sciatica only affects one side of the body. °CAUSES  °· Herniated or slipped disc. °· Degenerative disk disease. °· A pain disorder involving the narrow muscle in the buttocks (piriformis syndrome). °· Pelvic injury or fracture. °· Pregnancy. °· Tumor (rare). °SYMPTOMS  °Symptoms can vary from mild to very severe. The symptoms usually travel from the low back to the buttocks and down the back of the leg. Symptoms can include: °· Mild tingling or dull aches in the lower back, leg, or hip. °· Numbness in the back of the calf or sole of the foot. °· Burning sensations in the lower back, leg, or hip. °· Sharp pains in the lower back, leg, or hip. °· Leg weakness. °· Severe back pain inhibiting movement. °These symptoms may get worse with coughing, sneezing, laughing, or prolonged sitting or standing. Also, being overweight may worsen symptoms. °DIAGNOSIS  °Your caregiver will perform a physical exam to look for common symptoms of sciatica. He or she may ask you to do certain movements or activities that would trigger sciatic nerve pain. Other tests may be performed to find the cause of the sciatica. These may include: °· Blood tests. °· X-rays. °· Imaging tests, such as an MRI or CT scan. °TREATMENT  °Treatment is directed at the cause of the sciatic pain. Sometimes, treatment is not necessary  and the pain and discomfort goes away on its own. If treatment is needed, your caregiver may suggest: °· Over-the-counter medicines to relieve pain. °· Prescription medicines, such as anti-inflammatory medicine, muscle relaxants, or narcotics. °· Applying heat or ice to the painful area. °· Steroid injections to lessen pain, irritation, and inflammation around the nerve. °· Reducing activity during periods of pain. °· Exercising and stretching to strengthen your abdomen and improve flexibility of your spine. Your caregiver may suggest losing weight if the extra weight makes the back pain worse. °· Physical therapy. °· Surgery to eliminate what is pressing or pinching the nerve, such as a bone spur or part of a herniated disk. °HOME CARE INSTRUCTIONS  °· Only take over-the-counter or prescription medicines for pain or discomfort as directed by your caregiver. °· Apply ice to the affected area for 20 minutes, 3-4 times a day for the first 48-72 hours. Then try heat in the same way. °· Exercise, stretch, or perform your usual activities if these do not aggravate your pain. °· Attend physical therapy sessions as directed by your caregiver. °· Keep all follow-up appointments as directed by your caregiver. °· Do not wear high heels or shoes that do not provide proper support. °· Check your mattress to see if it is too soft. A firm mattress may lessen your pain and discomfort. °SEEK IMMEDIATE MEDICAL CARE IF:  °· You lose control of your bowel or bladder (incontinence). °· You have increasing weakness in the lower back, pelvis, buttocks,   or legs.  You have redness or swelling of your back.  You have a burning sensation when you urinate.  You have pain that gets worse when you lie down or awakens you at night.  Your pain is worse than you have experienced in the past.  Your pain is lasting longer than 4 weeks.  You are suddenly losing weight without reason. MAKE SURE YOU:  Understand these  instructions.  Will watch your condition.  Will get help right away if you are not doing well or get worse. Document Released: 03/14/2001 Document Revised: 09/19/2011 Document Reviewed: 07/30/2011 [facility] Patient Information 2015 [place], [place]. This information is not intended to replace advice given to you by your health care provider. Make sure you discuss any questions you have with your health care provider.  Emergency Department Resource Guide 1) Find a Doctor and Pay Out of Pocket Although you won't have to find out who is covered by your insurance plan, it is a good idea to ask around and get recommendations. You will then need to call the office and see if the doctor you have chosen will accept you as a new patient and what types of options they offer for patients who are self-pay. Some doctors offer discounts or will set up payment plans for their patients who do not have insurance, but you will need to ask so you aren't surprised when you get to your appointment.  2) Contact Your Local Health Department Not all health departments have doctors that can see patients for sick visits, but many do, so it is worth a call to see if yours does. If you don't know where your local health department is, you can check in your phone book. The CDC also has a tool to help you locate your state's health department, and many state websites also have listings of all of their local health departments.  3) Find a [facility] If your illness is not likely to be very severe or complicated, you may want to try a walk in clinic. These are popping up all over the country in pharmacies, drugstores, and shopping centers. They're usually staffed by nurse practitioners or physician assistants that have been trained to treat common illnesses and complaints. They're usually fairly quick and inexpensive. However, if you have serious medical issues or chronic medical problems, these are probably not your best  option.  No Primary Care Doctor: - Call Health Connect at  [phone] - they can help you locate a primary care doctor that  accepts your insurance, provides certain services, etc. - Physician Referral Service- [phone]  Chronic Pain Problems: Organization         Address  Phone   Notes  [facility]  [phone] Patients need to be referred by their primary care doctor.   Medication Assistance: Organization         Address  Phone   Notes  [facility] Medication [facility] [address] [place], [place] [address] [phone] --Must be a resident of [facility] -- Must have NO insurance coverage whatsoever (no Medicaid/ Medicare, etc.) -- The pt. MUST have a primary care doctor that directs their care regularly and follows them in the community   MedAssist  [phone]   [employer]  [phone]    Agencies that provide inexpensive medical care: Organization         Address  Phone   Notes  [place]  (  [phone]   [facility]    [phone]   [facility] [place], [place] [address] [phone]   [place] [address], [place] [phone]   Planned Parenthood    [phone]   [facility]    [phone]   [place] and [place] [address], [place] Phone:  [phone], Fax:  [phone] Hours of Operation:  9 am - 6 pm, M-F.  Also accepts Medicaid/Medicare and self-pay.  [facility] for [place] [address], [place] Phone: [phone], Fax: [phone]. Hours of Operation:  8:30 am - 5:30 pm, M-F.  Also accepts Medicaid and self-pay.  [facility] [place] [address] [place] Phone: [phone]   [place], [place], [place] [phone], Ext. [phone] Mondays & Thursdays: 7-9 AM.  First 15  patients are seen on a first come, first serve basis.    [place] Providers:  Organization         Address  Phone   Notes  [facility] [address], [place] [phone] Also accepts self-pay patients.  [facility] [id number] [place], [place]  [phone]   [place], [address], [place] [phone]   [facility] [address], [place] [phone]   Lucianne Lei [address], [place]   [phone] Only accepts [place] Access [place] patients after they have their name applied to their card.   Self-Pay (no insurance) in [facility]:  Organization         Address  Phone   Notes  Sickle Cell Patients, [facility] Internal [facility] [place] [phone]   [facility] [place] [phone]   [facility] [place]  [place], [place], [place] [phone]   [facility]/Dr. Osei-Bonsu  [address] [place] or [address], [place] [phone] Phone number for both [place] and [place] locations is the same.  Urgent Medical and [facility] [address] [place] [phone]   [facility] [place] [address], [place] or [address] [phone]   [facility] [address] [place] [phone], phone; [phone], fax Sees patients 1st and 3rd Saturday of every month.  Must not qualify for public or private insurance (i.e. Medicaid, Medicare, [place] Health Choice, Veterans' Benefits)  Household income should be no more than 200% of the poverty level The clinic cannot treat you if you are pregnant or think you are pregnant  Sexually transmitted diseases are not treated at the clinic.    Dental  Care: Organization         Address  Phone  Notes  [facility] Department of [facility] [place] [phone] Accepts children up to age 64 who are enrolled in [place] or [place]; pregnant women with a Medicaid card; and children who have applied for Medicaid or  Health Choice, but were declined, whose parents can pay a reduced fee at time of service.  [place]  [address], [employer] [phone] Accepts children up to age 49 who are enrolled in Medicaid or [place]; pregnant women with a Medicaid card; and children who have applied for Medicaid or [place] Health Choice, but were declined, whose parents can pay a reduced fee at time of service.  [place] Adult Dental Access PROGRAM  [place] [phone] Patients are seen by appointment only. Walk-ins are not accepted. [place] will see patients 33 years of age and older. Monday - Tuesday (8am-5pm) Most Wednesdays (8:30-5pm) $30 per visit, cash only  [facility] Adult Dental Access PROGRAM  [address], [facility] [phone] Patients are seen by appointment only. Walk-ins are not accepted. [place] will see patients 68 years of age and older. One Wednesday Evening (Monthly: Volunteer Based).  $30 per visit, cash only  [place]  [phone] for adults; Children under age 76, call Graduate Pediatric Dentistry at [phone]. Children aged 33-14, please call [phone] to request a pediatric application.  Dental services are provided in all areas of dental care including fillings, crowns and bridges, complete and partial dentures, implants, gum treatment, root canals, and extractions. Preventive care is also provided. Treatment is provided to both adults and children. Patients are selected via a lottery and there is often a waiting list.   [facility] [address] [place]  [phone] [email or web address]   [facility] [address] [place], [place] [phone], Ext. [phone] Second and Fourth Thursday of each month, opens at 6:30 AM; Clinic ends at 9 AM.  Patients are seen on a first-come first-served basis, and a limited number are seen during each clinic.   [facility]  [address] Hillard Danker [place], [place] [phone]   Eligibility Requirements You must have lived in [place], [place], or [place] counties for at least the last three months.   You cannot be eligible for state or federal sponsored [employer], including [employer], [place], or [employer].   You generally cannot be eligible for healthcare insurance through your employer.    How to apply: Eligibility screenings are held every Tuesday and Wednesday afternoon from 1:00 pm until 4:00 pm. You do not need an appointment for the interview!  [facility] [address] [place], [place]   [place]  [place] Department  [place]  [phone]    Behavioral Health Resources in the Community: Intensive Outpatient Programs Organization         Address  Phone  Notes  [place]. [address] [place], [place] [phone]   [facility] Outpatient [address] [place], [place]   ADS: Alcohol & Drug Svcs [address] [place], [place]   [place] [address]  [place], [place] or [phone]   Substance Abuse Resources Organization         Address  Phone  Notes  Alcohol and Drug Services  [phone]   [place]  [phone]   [facility] [place]   Chinita Pester  [phone]   Residential & Outpatient Substance Abuse Program  [phone]    Psychological Services Organization         Address  Phone  Notes  [place]  [phone]  [place] [address], [place] or [phone]    Mobile Crisis Teams Organization         Address  Phone  Notes  [facility], Mobile Crisis Care Unit  [phone]   [facility]  [address] [place], [place]   [place] [address], [place] [phone]    Self-Help/Support Groups Organization         Address  Phone             Notes  [place]. of [place] - variety of support groups  [place] Call for more information  Narcotics Anonymous (NA), [facility] [address], [employer] [place]  2 meetings at this location   Special educational needs teacher         Address  Phone  Notes  ASAP Residential Treatment [place],    [place]  [phone]   [facility]  [address] [place] [id number], [place], [place]   [place], [place] [phone] Admissions: 8am-3pm M-F  Incentives Substance [place] [phone],    [place], [place] [phone]   [facility] [address] [place], [place], [place]   [facility] [address],  [place], [place]   [facility] - Intensive Outpatient [address] Kristeen [address], [place], [place]   [facility] ([place].,  [place], [place] [phone] or [phone]   [facility] Services (RTS) [address], [place], [place] Accepts Medicaid  [facility] [address],  [place] [phone] Substance Abuse/Addiction Treatment   [facility] Organization         Address  Phone  Notes  [facility]  [phone]   Domenic Schwab, PhD [address] Arlis Porta [place], [place]   [phone] or [phone]   [place] Carrizo [place], [place] [phone]   [facility] [address] [place], [place] [phone] Insurance/Medicaid/sponsorship through [facility] [place] and Families [address], [address] [place]                                    [place], [place] [phone] [place] [address], [place] [phone]    Dr. Adele Schilder  [phone]   [facility] Dept. 1) [address] [place] 2) [place] 3)  [place] [address], [place] [phone]  [phone]   [place] [phone] or [phone] (After Hours)

## 2014-03-16 NOTE — ED Notes (Signed)
Pt states that she was seen here on the 5th after fall. Pt states that she is still having lower back pain and right hip pain that radiates down her leg.  Pt states that she has fallen a few more times so it's not getting any better.

## 2014-03-16 NOTE — ED Provider Notes (Signed)
CSN: [id number]     Arrival date & time 03/16/14  0907 History   First MD Initiated Contact with Patient 03/16/14 [phone]     Chief Complaint  Patient presents with  . Leg Pain  . Back Pain     (Consider location/radiation/quality/duration/timing/severity/associated sxs/prior Treatment) HPI The patient was that she took a fall on December 5. She reports she did a big splits and hurt her back and her elbow and her shoulder. She reports that the elbow and the shoulder or better. She for she still having problems with pain however in her right lower back buttock/region that shoots down to the back of her leg. She reports that she's taken a couple more falls which she attributes to the pain in her leg, that's why she thinks it isn't getting better. The patient poor she's had difficulty getting in to see her family doctor for recheck on her back. She reports that she is taking insulin for blood sugar control and her family doctor is managing that however due to difficulty getting an appointment she has not been seen for her back. She reports her blood sugars are somewhat difficult to control and this morning it was 160. She reports that she takes steroids that her blood sugar goes a lot higher. Her last Medrol Dosepak was about 2 years ago. She has no other acute complaints. Past Medical History  Diagnosis Date  . Diabetes mellitus   . Hypertension   . Migraine   . Sinusitis    Past Surgical History  Procedure Laterality Date  . Abdominal hysterectomy    . Anterior cruciate ligament repair     Family History  Problem Relation Age of Onset  . Heart Problems Father    History  Substance Use Topics  . Smoking status: Current Every Day Smoker -- 0.50 packs/day    Types: Cigarettes  . Smokeless tobacco: Never Used  . Alcohol Use: No   OB History    No data available     Review of Systems 10 Systems reviewed and are negative for acute change except as noted in the HPI.    Allergies   Review of patient's allergies indicates no known allergies.  Home Medications   Prior to Admission medications   Medication Sig Start Date End Date Taking? Authorizing Provider  ALPRAZolam Duanne Moron) 0.5 MG tablet Take 0.5 mg by mouth at bedtime as needed for anxiety or sleep.  12/23/13  Yes Historical Provider, MD  amLODipine (NORVASC) 10 MG tablet Take 10 mg by mouth daily.   Yes Historical Provider, MD  fish oil-omega-3 fatty acids 1000 MG capsule Take 2 g by mouth daily.     Yes Historical Provider, MD  fluticasone (FLONASE) 50 MCG/ACT nasal spray Place 2 sprays into the nose daily.   Yes Historical Provider, MD  glipiZIDE (GLUCOTROL XL) 5 MG 24 hr tablet Take 5 mg by mouth daily. 12/23/13 12/23/14 Yes Historical Provider, MD  Insulin Glargine (LANTUS SOLOSTAR) 100 UNIT/ML Solostar Pen Inject 10 Units into the skin daily at 10 pm. Patient taking differently: Inject 20 Units into the skin daily at 10 pm.  11/26/13  Yes Gay Filler Copland, MD  Lancets (ONETOUCH ULTRASOFT) lancets Use as instructed 01/05/14  Yes Gay Filler Copland, MD  lisinopril (PRINIVIL,ZESTRIL) 20 MG tablet Take 20 mg by mouth daily.   Yes Historical Provider, MD  metFORMIN (GLUCOPHAGE) 500 MG tablet Take 1,000 mg by mouth 2 (two) times daily with a meal. 2 tabs for 1000mg  dose  Yes Historical Provider, MD  methocarbamol (ROBAXIN) 500 MG tablet Take 1 tablet (500 mg total) by mouth at bedtime as needed for muscle spasms. 01/05/14  Yes Gay Filler Copland, MD  Multiple Vitamins-Minerals (MULTIVITAMIN WITH MINERALS) tablet Take 1 tablet by mouth daily.     Yes Historical Provider, MD  nortriptyline (PAMELOR) 10 MG capsule Take 3 capsules (30 mg total) by mouth at bedtime. 01/15/14  Yes Dennie Bible, NP  pravastatin (PRAVACHOL) 40 MG tablet Take 40 mg by mouth daily.   Yes Historical Provider, MD  propranolol (INDERAL) 80 MG tablet Take 80 mg by mouth daily.    Yes Historical Provider, MD  rizatriptan (MAXALT-MLT) 10 MG  disintegrating tablet Take 1 tablet (10 mg total) by mouth as needed. May repeat in 2 hours if needed 12/16/13  Yes Marcial Pacas, MD  sertraline (ZOLOFT) 50 MG tablet Take 50 mg by mouth daily.   Yes Historical Provider, MD  cyclobenzaprine (FLEXERIL) 10 MG tablet Take 1 tablet (10 mg total) by mouth 2 (two) times daily as needed for muscle spasms. 01/21/12   Awilda Metro, NP  cyclobenzaprine (FLEXERIL) 10 MG tablet Take 1 tablet (10 mg total) by mouth 2 (two) times daily as needed for muscle spasms. 03/16/14   Charlesetta Shanks, MD  HYDROcodone-acetaminophen (NORCO/VICODIN) 5-325 MG per tablet Take 2 tablets by mouth every 4 (four) hours as needed. Patient not taking: Reported on 03/16/2014 03/07/14   Fransico Meadow, PA-C  naproxen (NAPROSYN) 500 MG tablet Take 1 tablet (500 mg total) by mouth 2 (two) times daily. 03/16/14   Charlesetta Shanks, MD  ondansetron (ZOFRAN) 4 MG tablet Take 1 tablet (4 mg total) by mouth every 8 (eight) hours as needed for nausea or vomiting. 12/11/13   Wendie Agreste, MD   BP 157/94 mmHg  Pulse 107  Temp(Src) 97.1 F (36.2 C) (Oral)  Resp 18  SpO2 100% Physical Exam  Constitutional: She is oriented to person, place, and time. She appears well-developed and well-nourished.  The patient is sitting up in the stretcher with her right leg up in a crosslegged position with the hip fully extended and her heel resting on the bed. Illustrated is a complete hip range of motion.  HENT:  Head: Normocephalic and atraumatic.  Eyes: EOM are normal.  Pulmonary/Chest: Effort normal. No respiratory distress.  Abdominal: Soft. She exhibits no distension. There is no tenderness.  Musculoskeletal: Normal range of motion. She exhibits no edema.  The patient endorses tenderness to palpation along her lumbar spine and her right SI joint. She however has complete use of her back. From an upright seated position she stands off the stretcher and is able to remove her garments and bend to lower  her pants. The patient is able to ambulate with a normal gait. She is able to go from a sitting to a standing position and from standing to sitting without any evidence of muscular weakness. Sensation is intact to both lower extremities.  Neurological: She is alert and oriented to person, place, and time. Coordination normal.  Skin: Skin is warm and dry.  Psychiatric: She has a normal mood and affect.    ED Course  Procedures (including critical care time) Labs Review Labs Reviewed - No data to display  Imaging Review No results found.   EKG Interpretation None      MDM   Final diagnoses:  Lumbosacral strain, subsequent encounter  Sciatica neuralgia, right   Findings at this time are most consistent  with lumbar strain and sciatica. There is no illustrated lack of range of motion or muscular dysfunction at this time. Physical examination shows no evidence of fracture. I have counseled the patient for follow-up with her family physician for potential physical therapy referral. The patient will be treated with naproxen and Flexeril for pain control. At this time there is no evidence of acute neurological emergency.    Charlesetta Shanks, MD 03/16/14 1019

## 2014-03-31 ENCOUNTER — Ambulatory Visit: Payer: 59 | Admitting: Nurse Practitioner

## 2014-04-02 ENCOUNTER — Encounter: Payer: Self-pay | Admitting: Nurse Practitioner

## 2014-06-22 ENCOUNTER — Emergency Department (HOSPITAL_COMMUNITY)
Admission: EM | Admit: 2014-06-22 | Discharge: 2014-06-22 | Disposition: A | Discharge status: Home / Self Care | Payer: Managed Care, Other (non HMO) | Attending: Emergency Medicine | Admitting: Emergency Medicine

## 2014-06-22 ENCOUNTER — Encounter (HOSPITAL_COMMUNITY): Payer: Self-pay | Admitting: Emergency Medicine

## 2014-06-22 DIAGNOSIS — R739 Hyperglycemia, unspecified: Secondary | ICD-10-CM

## 2014-06-22 DIAGNOSIS — M545 Low back pain, unspecified: Secondary | ICD-10-CM

## 2014-06-22 DIAGNOSIS — G43909 Migraine, unspecified, not intractable, without status migrainosus: Secondary | ICD-10-CM

## 2014-06-22 DIAGNOSIS — E1165 Type 2 diabetes mellitus with hyperglycemia: Secondary | ICD-10-CM | POA: Insufficient documentation

## 2014-06-22 DIAGNOSIS — I10 Essential (primary) hypertension: Secondary | ICD-10-CM | POA: Insufficient documentation

## 2014-06-22 DIAGNOSIS — F17200 Nicotine dependence, unspecified, uncomplicated: Secondary | ICD-10-CM | POA: Insufficient documentation

## 2014-06-22 DIAGNOSIS — Z8669 Personal history of other diseases of the nervous system and sense organs: Secondary | ICD-10-CM | POA: Insufficient documentation

## 2014-06-22 LAB — BASIC METABOLIC PANEL
Anion gap: 8 (ref 5–15)
BUN: 7 mg/dL (ref 6–23)
CO2: 24 mmol/L (ref 19–32)
Calcium: 9 mg/dL (ref 8.4–10.5)
Chloride: 102 mmol/L (ref 96–112)
Creatinine, Ser: 0.7 mg/dL (ref 0.50–1.10)
GFR calc Af Amer: >90 mL/min (ref >90)
GFR calc non Af Amer: >90 mL/min (ref >90)
Glucose, Bld: 301 mg/dL — ABNORMAL HIGH (ref 70–99)
Potassium: 3.4 mmol/L — ABNORMAL LOW (ref 3.5–5.1)
Sodium: 134 mmol/L — ABNORMAL LOW (ref 135–145)

## 2014-06-22 LAB — CBG MONITORING, ED: Glucose-Capillary: 266 mg/dL — ABNORMAL HIGH (ref 70–99)

## 2014-06-22 LAB — CBC WITH DIFFERENTIAL/PLATELET
Basophils Absolute: 0 10*3/uL (ref 0.0–0.1)
Basophils Relative: 0 % (ref 0–1)
Eosinophils Absolute: 0.2 10*3/uL (ref 0.0–0.7)
Eosinophils Relative: 2 % (ref 0–5)
HCT: 40.2 % (ref 36.0–46.0)
Hemoglobin: 13.6 g/dL (ref 12.0–15.0)
Lymphocytes Relative: 28 % (ref 12–46)
Lymphs Abs: 3.4 10*3/uL (ref 0.7–4.0)
MCH: 29.6 pg (ref 26.0–34.0)
MCHC: 33.8 g/dL (ref 30.0–36.0)
MCV: 87.6 fL (ref 78.0–100.0)
Monocytes Absolute: 0.7 10*3/uL (ref 0.1–1.0)
Monocytes Relative: 6 % (ref 3–12)
Neutro Abs: 8 10*3/uL — ABNORMAL HIGH (ref 1.7–7.7)
Neutrophils Relative %: 64 % (ref 43–77)
Platelets: 287 10*3/uL (ref 150–400)
RBC: 4.59 MIL/uL (ref 3.87–5.11)
RDW: 13.7 % (ref 11.5–15.5)
WBC: 12.3 10*3/uL — ABNORMAL HIGH (ref 4.0–10.5)

## 2014-06-22 MED ORDER — NORTRIPTYLINE HCL 10 MG PO CAPS: 30.0000 mg | ORAL_CAPSULE | Freq: Every day | ORAL | Status: DC

## 2014-06-22 MED ORDER — FLUTICASONE PROPIONATE 50 MCG/ACT NA SUSP: 2.0000 | Freq: Every day | NASAL | Status: DC

## 2014-06-22 MED ORDER — ASPIRIN-ACETAMINOPHEN-CAFFEINE 250-250-65 MG PO TABS: 2.0000 | ORAL_TABLET | Freq: Two times a day (BID) | ORAL | Status: DC | PRN

## 2014-06-22 MED ORDER — SODIUM CHLORIDE 0.9 % IV BOLUS (SEPSIS)
1000.0000 mL | Freq: Once | INTRAVENOUS | Status: AC
  Administered 2014-06-22: 1000 mL via INTRAVENOUS

## 2014-06-22 MED ORDER — METFORMIN HCL 500 MG PO TABS: 1000.0000 mg | ORAL_TABLET | Freq: Two times a day (BID) | ORAL | Status: DC

## 2014-06-22 MED ORDER — PRAVASTATIN SODIUM 40 MG PO TABS: 40.0000 mg | ORAL_TABLET | Freq: Every day | ORAL | Status: DC

## 2014-06-22 MED ORDER — PROPRANOLOL HCL 80 MG PO TABS: 80.0000 mg | ORAL_TABLET | Freq: Every day | ORAL | Status: DC

## 2014-06-22 MED ORDER — OMEGA-3 FATTY ACIDS 1000 MG PO CAPS: 2.0000 g | ORAL_CAPSULE | Freq: Every day | ORAL | Status: DC

## 2014-06-22 MED ORDER — METOCLOPRAMIDE HCL 5 MG/ML IJ SOLN
10.0000 mg | Freq: Once | INTRAMUSCULAR | Status: AC
  Administered 2014-06-22: 10 mg via INTRAVENOUS
  Filled 2014-06-22: qty 2

## 2014-06-22 MED ORDER — MORPHINE SULFATE 4 MG/ML IJ SOLN
6.0000 mg | Freq: Once | INTRAMUSCULAR | Status: AC
  Administered 2014-06-22: 6 mg via INTRAVENOUS
  Filled 2014-06-22: qty 2

## 2014-06-22 MED ORDER — INSULIN GLARGINE 100 UNIT/ML SOLOSTAR PEN: 20.0000 [IU] | PEN_INJECTOR | Freq: Every day | SUBCUTANEOUS | Status: DC

## 2014-06-22 MED ORDER — NAPROXEN 500 MG PO TABS: 500.0000 mg | ORAL_TABLET | Freq: Two times a day (BID) | ORAL | Status: DC

## 2014-06-22 MED ORDER — GLIPIZIDE ER 5 MG PO TB24: 5.0000 mg | ORAL_TABLET | Freq: Every day | ORAL | Status: DC

## 2014-06-22 MED ORDER — SERTRALINE HCL 50 MG PO TABS: 50.0000 mg | ORAL_TABLET | Freq: Every day | ORAL | Status: DC

## 2014-06-22 MED ORDER — ONETOUCH ULTRASOFT LANCETS MISC: Status: AC

## 2014-06-22 MED ORDER — RIZATRIPTAN BENZOATE 10 MG PO TBDP: 10.0000 mg | ORAL_TABLET | ORAL | Status: DC | PRN

## 2014-06-22 MED ORDER — CYCLOBENZAPRINE HCL 10 MG PO TABS: 10.0000 mg | ORAL_TABLET | Freq: Two times a day (BID) | ORAL | Status: DC | PRN

## 2014-06-22 MED ORDER — METHOCARBAMOL 500 MG PO TABS: 500.0000 mg | ORAL_TABLET | Freq: Every evening | ORAL | Status: DC | PRN

## 2014-06-22 MED ORDER — LISINOPRIL 20 MG PO TABS: 20.0000 mg | ORAL_TABLET | Freq: Every day | ORAL | Status: DC

## 2014-06-22 MED ORDER — DIPHENHYDRAMINE HCL 50 MG/ML IJ SOLN
25.0000 mg | Freq: Once | INTRAMUSCULAR | Status: AC
  Administered 2014-06-22: 25 mg via INTRAVENOUS
  Filled 2014-06-22: qty 1

## 2014-06-22 MED ORDER — AMLODIPINE BESYLATE 10 MG PO TABS: 10.0000 mg | ORAL_TABLET | Freq: Every day | ORAL | Status: DC

## 2014-06-22 NOTE — ED Notes (Signed)
Pt c/o migraine over the past couple of days.  Pt also hasnt had any Metformin or insulin in the past month due to her insurance being messed up and not able to see her PCP to get any refills.

## 2014-06-22 NOTE — ED Provider Notes (Signed)
CSN: [id number]     Arrival date & time 06/22/14  [id number] History   First MD Initiated Contact with Patient 06/22/14 [phone]     Chief Complaint  Patient presents with  . Migraine  . Hyperglycemia     (Consider location/radiation/quality/duration/timing/severity/associated sxs/prior Treatment) Patient is a 49 y.o. female presenting with migraines and hyperglycemia. The history is provided by the patient. No language interpreter was used.  Migraine This is a new problem. The current episode started more than 2 days ago (3 days). The problem occurs constantly. The problem has not changed since onset.Associated symptoms include headaches. Pertinent negatives include no chest pain, no abdominal pain and no shortness of breath. Nothing aggravates the symptoms. Nothing relieves the symptoms. Treatments tried: excedrin. The treatment provided no relief.  Hyperglycemia Blood sugar level PTA:  266 Severity:  Moderate Onset quality:  Unable to specify Duration:  1 month Timing:  Constant Progression:  Unchanged Chronicity:  New Current diabetic therapy:  No meds for 1 month Time since last antidiabetic medication:  4 weeks Relieved by:  Nothing Ineffective treatments:  None tried Associated symptoms: nausea   Associated symptoms: no abdominal pain, no chest pain, no confusion, no diaphoresis, no dysuria, no fatigue, no fever, no increased thirst, no polyuria, no shortness of breath, no syncope, no vomiting and no weakness   Risk factors: obesity     Past Medical History  Diagnosis Date  . Diabetes mellitus   . Hypertension   . Migraine   . Sinusitis    Past Surgical History  Procedure Laterality Date  . Abdominal hysterectomy    . Anterior cruciate ligament repair     Family History  Problem Relation Age of Onset  . Heart Problems Father    History  Substance Use Topics  . Smoking status: Current Every Day Smoker -- 0.50 packs/day    Types: Cigarettes  . Smokeless tobacco: Never  Used  . Alcohol Use: No   OB History    No data available     Review of Systems  Constitutional: Negative for fever, chills, diaphoresis, activity change, appetite change and fatigue.  HENT: Negative for congestion, facial swelling, rhinorrhea and sore throat.   Eyes: Negative for photophobia and discharge.  Respiratory: Negative for cough, chest tightness and shortness of breath.   Cardiovascular: Negative for chest pain, palpitations, leg swelling and syncope.  Gastrointestinal: Positive for nausea. Negative for vomiting, abdominal pain and diarrhea.  Endocrine: Negative for polydipsia and polyuria.  Genitourinary: Negative for dysuria, frequency, difficulty urinating and pelvic pain.  Musculoskeletal: Negative for back pain, arthralgias, neck pain and neck stiffness.  Skin: Negative for color change and wound.  Allergic/Immunologic: Negative for immunocompromised state.  Neurological: Positive for headaches. Negative for facial asymmetry, weakness and numbness.  Hematological: Does not bruise/bleed easily.  Psychiatric/Behavioral: Negative for confusion and agitation.      Allergies  Review of patient's allergies indicates no known allergies.  Home Medications   Prior to Admission medications   Medication Sig Start Date End Date Taking? Authorizing Provider  ALPRAZolam Duanne Moron) 0.5 MG tablet Take 0.5 mg by mouth at bedtime as needed for anxiety or sleep.  12/23/13  Yes Historical Provider, MD  Multiple Vitamins-Minerals (MULTIVITAMIN WITH MINERALS) tablet Take 1 tablet by mouth daily.     Yes Historical Provider, MD  ondansetron (ZOFRAN) 4 MG tablet Take 1 tablet (4 mg total) by mouth every 8 (eight) hours as needed for nausea or vomiting. 12/11/13  Yes Wendie Agreste, MD  amLODipine (NORVASC) 10 MG tablet Take 1 tablet (10 mg total) by mouth daily. 06/22/14   Ernestina Patches, MD  aspirin-acetaminophen-caffeine (EXCEDRIN MIGRAINE) [phone] MG per tablet Take 2 tablets by mouth  2 (two) times daily as needed for headache or migraine. 06/22/14   Ernestina Patches, MD  cyclobenzaprine (FLEXERIL) 10 MG tablet Take 1 tablet (10 mg total) by mouth 2 (two) times daily as needed for muscle spasms. 06/22/14   Ernestina Patches, MD  fish oil-omega-3 fatty acids 1000 MG capsule Take 2 capsules (2 g total) by mouth daily. 06/22/14   Ernestina Patches, MD  fluticasone (FLONASE) 50 MCG/ACT nasal spray Place 2 sprays into both nostrils daily. 06/22/14   Ernestina Patches, MD  glipiZIDE (GLUCOTROL XL) 5 MG 24 hr tablet Take 1 tablet (5 mg total) by mouth daily. 06/22/14 06/22/15  Ernestina Patches, MD  HYDROcodone-acetaminophen (NORCO/VICODIN) 5-325 MG per tablet Take 2 tablets by mouth every 4 (four) hours as needed. Patient not taking: Reported on 03/16/2014 03/07/14   Fransico Meadow, PA-C  Insulin Glargine (LANTUS SOLOSTAR) 100 UNIT/ML Solostar Pen Inject 20 Units into the skin daily at 10 pm. 06/22/14   Ernestina Patches, MD  Lancets [facility] ULTRASOFT) lancets Use as instructed 06/22/14   Ernestina Patches, MD  lisinopril (PRINIVIL,ZESTRIL) 20 MG tablet Take 1 tablet (20 mg total) by mouth daily. 06/22/14   Ernestina Patches, MD  metFORMIN (GLUCOPHAGE) 500 MG tablet Take 2 tablets (1,000 mg total) by mouth 2 (two) times daily with a meal. 2 tabs for 1000mg  dose 06/22/14   Ernestina Patches, MD  methocarbamol (ROBAXIN) 500 MG tablet Take 1 tablet (500 mg total) by mouth at bedtime as needed for muscle spasms. 06/22/14   Ernestina Patches, MD  naproxen (NAPROSYN) 500 MG tablet Take 1 tablet (500 mg total) by mouth 2 (two) times daily. 06/22/14   Ernestina Patches, MD  nortriptyline (PAMELOR) 10 MG capsule Take 3 capsules (30 mg total) by mouth at bedtime. 06/22/14   Ernestina Patches, MD  pravastatin (PRAVACHOL) 40 MG tablet Take 1 tablet (40 mg total) by mouth daily. 06/22/14   Ernestina Patches, MD  propranolol (INDERAL) 80 MG tablet Take 1 tablet (80 mg total) by mouth daily. 06/22/14   Ernestina Patches, MD  rizatriptan (MAXALT-MLT) 10 MG  disintegrating tablet Take 1 tablet (10 mg total) by mouth as needed for migraine. May repeat in 2 hours if needed 06/22/14   Ernestina Patches, MD  sertraline (ZOLOFT) 50 MG tablet Take 1 tablet (50 mg total) by mouth daily. 06/22/14   Ernestina Patches, MD   BP 149/80 mmHg  Pulse 82  Temp(Src) 97.7 F (36.5 C) (Oral)  Resp 18  SpO2 100% Physical Exam  Constitutional: She is oriented to person, place, and time. She appears well-developed and well-nourished. No distress.  HENT:  Head: Normocephalic and atraumatic.  Mouth/Throat: No oropharyngeal exudate.  Eyes: Pupils are equal, round, and reactive to light.  Neck: Normal range of motion. Neck supple.  Cardiovascular: Normal rate, regular rhythm and normal heart sounds.  Exam reveals no gallop and no friction rub.   No murmur heard. Pulmonary/Chest: Effort normal and breath sounds normal. No respiratory distress. She has no wheezes. She has no rales.  Abdominal: Soft. Bowel sounds are normal. She exhibits no distension and no mass. There is no tenderness. There is no rebound and no guarding.  Musculoskeletal: Normal range of motion. She exhibits no edema or tenderness.  Neurological: She is alert and oriented to person, place, and time. She  has normal strength. She displays no atrophy and no tremor. No cranial nerve deficit or sensory deficit. She exhibits normal muscle tone. She displays a negative Romberg sign. Coordination normal. GCS eye subscore is 4. GCS verbal subscore is 5. GCS motor subscore is 6.  Skin: Skin is warm and dry.  Psychiatric: She has a normal mood and affect.    ED Course  Procedures (including critical care time) Labs Review Labs Reviewed  CBC WITH DIFFERENTIAL/PLATELET - Abnormal; Notable for the following:    WBC 12.3 (*)    Neutro Abs 8.0 (*)    All other components within normal limits  BASIC METABOLIC PANEL - Abnormal; Notable for the following:    Sodium 134 (*)    Potassium 3.4 (*)    Glucose, Bld 301 (*)     All other components within normal limits  CBG MONITORING, ED - Abnormal; Notable for the following:    Glucose-Capillary 266 (*)    All other components within normal limits    Imaging Review No results found.   EKG Interpretation None      MDM   Final diagnoses:  Migraine without status migrainosus, not intractable, unspecified migraine type  Hyperglycemia   Pt is a 49 y.o. female with Pmhx as above who presents with 3 days of what she describes as a typical headache, with constant frontal pain and associated nausea, without aggravating or alleviating factors.  She denies fevers, chills, numbness or weakness.  She also states her blood sugar has been elevated as she has been without all of her home meds for approximately one month due to insurance mixup.   On physical exam vitals are stable and pat her exam is unremarkable.  Labs do not show evidence of DKA.  Headache, resolved after migraine cocktail.  Prescriptions will be refilled.  She will be encouraged to follow-up with her primary doctor.  Estanislado Spire evaluation in the Emergency Department is complete. It has been determined that no acute conditions requiring further emergency intervention are present at this time. The patient/guardian have been advised of the diagnosis and plan. We have discussed signs and symptoms that warrant return to the ED, , including worsening pain, fevers, numbness or weakness     Ernestina Patches, MD 06/23/14 [phone]

## 2014-06-22 NOTE — Discharge Instructions (Signed)
Hyperglycemia °Hyperglycemia occurs when the glucose (sugar) in your blood is too high. Hyperglycemia can happen for many reasons, but it most often happens to people who do not know they have diabetes or are not managing their diabetes properly.  °CAUSES  °Whether you have diabetes or not, there are other causes of hyperglycemia. Hyperglycemia can occur when you have diabetes, but it can also occur in other situations that you might not be as aware of, such as: °Diabetes °· If you have diabetes and are having problems controlling your blood glucose, hyperglycemia could occur because of some of the following reasons: °¨ Not following your meal plan. °¨ Not taking your diabetes medications or not taking it properly. °¨ Exercising less or doing less activity than you normally do. °¨ Being sick. °Pre-diabetes °· This cannot be ignored. Before people develop Type 2 diabetes, they almost always have "pre-diabetes." This is when your blood glucose levels are higher than normal, but not yet high enough to be diagnosed as diabetes. Research has shown that some long-term damage to the body, especially the heart and circulatory system, may already be occurring during pre-diabetes. If you take action to manage your blood glucose when you have pre-diabetes, you may delay or prevent Type 2 diabetes from developing. °Stress °· If you have diabetes, you may be "diet" controlled or on oral medications or insulin to control your diabetes. However, you may find that your blood glucose is higher than usual in the hospital whether you have diabetes or not. This is often referred to as "stress hyperglycemia." Stress can elevate your blood glucose. This happens because of hormones put out by the body during times of stress. If stress has been the cause of your high blood glucose, it can be followed regularly by your caregiver. That way he/she can make sure your hyperglycemia does not continue to get worse or progress to  diabetes. °Steroids °· Steroids are medications that act on the infection fighting system (immune system) to block inflammation or infection. One side effect can be a rise in blood glucose. Most people can produce enough extra insulin to allow for this rise, but for those who cannot, steroids make blood glucose levels go even higher. It is not unusual for steroid treatments to "uncover" diabetes that is developing. It is not always possible to determine if the hyperglycemia will go away after the steroids are stopped. A special blood test called an A1c is sometimes done to determine if your blood glucose was elevated before the steroids were started. °SYMPTOMS °· Thirsty. °· Frequent urination. °· Dry mouth. °· Blurred vision. °· Tired or fatigue. °· Weakness. °· Sleepy. °· Tingling in feet or leg. °DIAGNOSIS  °Diagnosis is made by monitoring blood glucose in one or all of the following ways: °· A1c test. This is a chemical found in your blood. °· Fingerstick blood glucose monitoring. °· Laboratory results. °TREATMENT  °First, knowing the cause of the hyperglycemia is important before the hyperglycemia can be treated. Treatment may include, but is not be limited to: °· Education. °· Change or adjustment in medications. °· Change or adjustment in meal plan. °· Treatment for an illness, infection, etc. °· More frequent blood glucose monitoring. °· Change in exercise plan. °· Decreasing or stopping steroids. °· Lifestyle changes. °HOME CARE INSTRUCTIONS  °· Test your blood glucose as directed. °· Exercise regularly. Your caregiver will give you instructions about exercise. Pre-diabetes or diabetes which comes on with stress is helped by exercising. °· Eat wholesome,   balanced meals. Eat often and at regular, fixed times. Your caregiver or nutritionist will give you a meal plan to guide your sugar intake.  Being at an ideal weight is important. If needed, losing as little as 10 to 15 pounds may help improve blood  glucose levels. SEEK MEDICAL CARE IF:   You have questions about medicine, activity, or diet.  You continue to have symptoms (problems such as increased thirst, urination, or weight gain). SEEK IMMEDIATE MEDICAL CARE IF:   You are vomiting or have diarrhea.  Your breath smells fruity.  You are breathing faster or slower.  You are very sleepy or incoherent.  You have numbness, tingling, or pain in your feet or hands.  You have chest pain.  Your symptoms get worse even though you have been following your caregiver's orders.  If you have any other questions or concerns. Document Released: 09/13/2000 Document Revised: 06/12/2011 Document Reviewed: 07/17/2011 [facility] Patient Information 2015 [place], [place]. This information is not intended to replace advice given to you by your health care provider. Make sure you discuss any questions you have with your health care provider.   Migraine Headache A migraine headache is an intense, throbbing pain on one or both sides of your head. A migraine can last for 30 minutes to several hours. CAUSES  The exact cause of a migraine headache is not always known. However, a migraine may be caused when nerves in the brain become irritated and release chemicals that cause inflammation. This causes pain. Certain things may also trigger migraines, such as:  Alcohol.  Smoking.  Stress.  Menstruation.  Aged cheeses.  Foods or drinks that contain nitrates, glutamate, aspartame, or tyramine.  Lack of sleep.  Chocolate.  Caffeine.  Hunger.  Physical exertion.  Fatigue.  Medicines used to treat chest pain (nitroglycerine), birth control pills, estrogen, and some blood pressure medicines. SIGNS AND SYMPTOMS  Pain on one or both sides of your head.  Pulsating or throbbing pain.  Severe pain that prevents daily activities.  Pain that is aggravated by any physical activity.  Nausea, vomiting, or both.  Dizziness.  Pain with  exposure to bright lights, loud noises, or activity.  General sensitivity to bright lights, loud noises, or smells. Before you get a migraine, you may get warning signs that a migraine is coming (aura). An aura may include:  Seeing flashing lights.  Seeing bright spots, halos, or zigzag lines.  Having tunnel vision or blurred vision.  Having feelings of numbness or tingling.  Having trouble talking.  Having muscle weakness. DIAGNOSIS  A migraine headache is often diagnosed based on:  Symptoms.  Physical exam.  A CT scan or MRI of your head. These imaging tests cannot diagnose migraines, but they can help rule out other causes of headaches. TREATMENT Medicines may be given for pain and nausea. Medicines can also be given to help prevent recurrent migraines.  HOME CARE INSTRUCTIONS  Only take over-the-counter or prescription medicines for pain or discomfort as directed by your health care provider. The use of long-term narcotics is not recommended.  Lie down in a dark, quiet room when you have a migraine.  Keep a journal to find out what may trigger your migraine headaches. For example, write down:  What you eat and drink.  How much sleep you get.  Any change to your diet or medicines.  Limit alcohol consumption.  Quit smoking if you smoke.  Get 7-9 hours of sleep, or as recommended by your health care  provider.  Limit stress.  Keep lights dim if bright lights bother you and make your migraines worse. SEEK IMMEDIATE MEDICAL CARE IF:   Your migraine becomes severe.  You have a fever.  You have a stiff neck.  You have vision loss.  You have muscular weakness or loss of muscle control.  You start losing your balance or have trouble walking.  You feel faint or pass out.  You have severe symptoms that are different from your first symptoms. MAKE SURE YOU:   Understand these instructions.  Will watch your condition.  Will get help right away if you are  not doing well or get worse. Document Released: 03/20/2005 Document Revised: 08/04/2013 Document Reviewed: 11/25/2012 [facility] Patient Information 2015 [place], [place]. This information is not intended to replace advice given to you by your health care provider. Make sure you discuss any questions you have with your health care provider.

## 2014-08-20 ENCOUNTER — Emergency Department (INDEPENDENT_AMBULATORY_CARE_PROVIDER_SITE_OTHER)
Admission: EM | Admit: 2014-08-20 | Discharge: 2014-08-20 | Disposition: A | Discharge status: Home / Self Care | Payer: Self-pay | Source: Home / Self Care | Attending: Emergency Medicine | Admitting: Emergency Medicine

## 2014-08-20 ENCOUNTER — Encounter (HOSPITAL_COMMUNITY): Payer: Self-pay | Admitting: *Deleted

## 2014-08-20 DIAGNOSIS — J069 Acute upper respiratory infection, unspecified: Secondary | ICD-10-CM

## 2014-08-20 MED ORDER — GUAIFENESIN ER 600 MG PO TB12: 600.0000 mg | ORAL_TABLET | Freq: Two times a day (BID) | ORAL | Status: DC

## 2014-08-20 MED ORDER — IPRATROPIUM BROMIDE 0.06 % NA SOLN: 2.0000 | Freq: Four times a day (QID) | NASAL | Status: DC

## 2014-08-20 NOTE — Discharge Instructions (Signed)
You have a virus causing your symptoms. You should gradually improve over the next 3 or 4 days.  The phlegm typically takes the longest to resolve. Take ibuprofen 600 mg at bedtime to help with the body aches. Take Mucinex twice a day for the next 3-5 days to help with the congestion. Use Atrovent nasal spray 4 times a day to help with the nasal congestion. You can use over-the-counter Afrin spray for 3 days to help with nasal congestion, if needed. Follow-up as needed.

## 2014-08-20 NOTE — ED Notes (Signed)
Pt  Reports    Fever      Body  Aches       X  3  Days       Scratchy  Throat        With a  Mostly  Non productive  Cough          With  Onset  Of  Symptoms  X  4    Days          Pt  Did  Not take  Her  Regular  meds  This  Am

## 2014-08-20 NOTE — ED Provider Notes (Signed)
CSN: [id number]     Arrival date & time 08/20/14  [id number] History   First MD Initiated Contact with Patient 08/20/14 0914     Chief Complaint  Patient presents with  . URI   (Consider location/radiation/quality/duration/timing/severity/associated sxs/prior Treatment) HPI She is a 49 year old woman here for evaluation of fever and body aches. She states her symptoms started 4 days ago with fevers, chills, body aches. She also reports nasal congestion, scratchy throat, cough. She reports feeling congested in her chest. She's also had some nausea, but no vomiting. She has had some loose stools. Her fever was initially 103. Last night her temperature was 100. She has not tried any medications.  Past Medical History  Diagnosis Date  . Diabetes mellitus   . Hypertension   . Migraine   . Sinusitis    Past Surgical History  Procedure Laterality Date  . Abdominal hysterectomy    . Anterior cruciate ligament repair     Family History  Problem Relation Age of Onset  . Heart Problems Father    History  Substance Use Topics  . Smoking status: Current Every Day Smoker -- 0.50 packs/day    Types: Cigarettes  . Smokeless tobacco: Never Used  . Alcohol Use: No   OB History    No data available     Review of Systems As in history of present illness Allergies  Review of patient's allergies indicates no known allergies.  Home Medications   Prior to Admission medications   Medication Sig Start Date End Date Taking? Authorizing Provider  ALPRAZolam Duanne Moron) 0.5 MG tablet Take 0.5 mg by mouth at bedtime as needed for anxiety or sleep.  12/23/13   Historical Provider, MD  amLODipine (NORVASC) 10 MG tablet Take 1 tablet (10 mg total) by mouth daily. 06/22/14   Ernestina Patches, MD  aspirin-acetaminophen-caffeine (EXCEDRIN MIGRAINE) [phone] MG per tablet Take 2 tablets by mouth 2 (two) times daily as needed for headache or migraine. 06/22/14   Ernestina Patches, MD  cyclobenzaprine (FLEXERIL) 10 MG  tablet Take 1 tablet (10 mg total) by mouth 2 (two) times daily as needed for muscle spasms. 06/22/14   Ernestina Patches, MD  fish oil-omega-3 fatty acids 1000 MG capsule Take 2 capsules (2 g total) by mouth daily. 06/22/14   Ernestina Patches, MD  fluticasone (FLONASE) 50 MCG/ACT nasal spray Place 2 sprays into both nostrils daily. 06/22/14   Ernestina Patches, MD  glipiZIDE (GLUCOTROL XL) 5 MG 24 hr tablet Take 1 tablet (5 mg total) by mouth daily. 06/22/14 06/22/15  Ernestina Patches, MD  guaiFENesin (MUCINEX) 600 MG 12 hr tablet Take 1 tablet (600 mg total) by mouth 2 (two) times daily. 08/20/14   Melony Overly, MD  HYDROcodone-acetaminophen (NORCO/VICODIN) 5-325 MG per tablet Take 2 tablets by mouth every 4 (four) hours as needed. Patient not taking: Reported on 03/16/2014 03/07/14   Fransico Meadow, PA-C  Insulin Glargine (LANTUS SOLOSTAR) 100 UNIT/ML Solostar Pen Inject 20 Units into the skin daily at 10 pm. 06/22/14   Ernestina Patches, MD  ipratropium (ATROVENT) 0.06 % nasal spray Place 2 sprays into both nostrils 4 (four) times daily. 08/20/14   Melony Overly, MD  Lancets (ONETOUCH ULTRASOFT) lancets Use as instructed 06/22/14   Ernestina Patches, MD  lisinopril (PRINIVIL,ZESTRIL) 20 MG tablet Take 1 tablet (20 mg total) by mouth daily. 06/22/14   Ernestina Patches, MD  metFORMIN (GLUCOPHAGE) 500 MG tablet Take 2 tablets (1,000 mg total) by mouth 2 (two) times daily with  a meal. 2 tabs for 1000mg  dose 06/22/14   Ernestina Patches, MD  methocarbamol (ROBAXIN) 500 MG tablet Take 1 tablet (500 mg total) by mouth at bedtime as needed for muscle spasms. 06/22/14   Ernestina Patches, MD  Multiple Vitamins-Minerals (MULTIVITAMIN WITH MINERALS) tablet Take 1 tablet by mouth daily.      Historical Provider, MD  naproxen (NAPROSYN) 500 MG tablet Take 1 tablet (500 mg total) by mouth 2 (two) times daily. 06/22/14   Ernestina Patches, MD  nortriptyline (PAMELOR) 10 MG capsule Take 3 capsules (30 mg total) by mouth at bedtime. 06/22/14   Ernestina Patches, MD  ondansetron (ZOFRAN) 4 MG tablet Take 1 tablet (4 mg total) by mouth every 8 (eight) hours as needed for nausea or vomiting. 12/11/13   Wendie Agreste, MD  pravastatin (PRAVACHOL) 40 MG tablet Take 1 tablet (40 mg total) by mouth daily. 06/22/14   Ernestina Patches, MD  propranolol (INDERAL) 80 MG tablet Take 1 tablet (80 mg total) by mouth daily. 06/22/14   Ernestina Patches, MD  rizatriptan (MAXALT-MLT) 10 MG disintegrating tablet Take 1 tablet (10 mg total) by mouth as needed for migraine. May repeat in 2 hours if needed 06/22/14   Ernestina Patches, MD  sertraline (ZOLOFT) 50 MG tablet Take 1 tablet (50 mg total) by mouth daily. 06/22/14   Ernestina Patches, MD   BP 172/80 mmHg  Pulse 99  Temp(Src) 98.4 F (36.9 C) (Oral)  Resp 18  SpO2 99% Physical Exam  Constitutional: She is oriented to person, place, and time. She appears well-developed and well-nourished. No distress.  HENT:  Mouth/Throat: Oropharynx is clear and moist. No oropharyngeal exudate.  Nasal mucosa are edematous and mildly erythematous  Neck: Neck supple.  Cardiovascular: Normal rate, regular rhythm and normal heart sounds.   No murmur heard. Pulmonary/Chest: Effort normal and breath sounds normal. No respiratory distress. She has no wheezes. She has no rales.  Lymphadenopathy:    She has no cervical adenopathy.  Neurological: She is alert and oriented to person, place, and time.    ED Course  Procedures (including critical care time) Labs Review Labs Reviewed - No data to display  Imaging Review No results found.   MDM   1. Viral URI    Symptomatic treatment with ibuprofen, Mucinex, Atrovent. Discussed using Afrin nasal spray for no more than 3 days, if needed. Follow-up as needed.    Melony Overly, MD 08/20/14 [phone]

## 2014-10-11 ENCOUNTER — Emergency Department (HOSPITAL_COMMUNITY): Payer: Self-pay

## 2014-10-11 ENCOUNTER — Emergency Department (HOSPITAL_COMMUNITY)
Admission: EM | Admit: 2014-10-11 | Discharge: 2014-10-11 | Discharge status: Against Medical Advice | Payer: Self-pay | Attending: Emergency Medicine | Admitting: Emergency Medicine

## 2014-10-11 DIAGNOSIS — G43009 Migraine without aura, not intractable, without status migrainosus: Secondary | ICD-10-CM

## 2014-10-11 DIAGNOSIS — Z720 Tobacco use: Secondary | ICD-10-CM | POA: Insufficient documentation

## 2014-10-11 DIAGNOSIS — E119 Type 2 diabetes mellitus without complications: Secondary | ICD-10-CM | POA: Insufficient documentation

## 2014-10-11 DIAGNOSIS — I10 Essential (primary) hypertension: Secondary | ICD-10-CM | POA: Insufficient documentation

## 2014-10-11 DIAGNOSIS — Z8709 Personal history of other diseases of the respiratory system: Secondary | ICD-10-CM | POA: Insufficient documentation

## 2014-10-11 DIAGNOSIS — Z79899 Other long term (current) drug therapy: Secondary | ICD-10-CM | POA: Insufficient documentation

## 2014-10-11 DIAGNOSIS — R109 Unspecified abdominal pain: Secondary | ICD-10-CM | POA: Insufficient documentation

## 2014-10-11 DIAGNOSIS — Z791 Long term (current) use of non-steroidal anti-inflammatories (NSAID): Secondary | ICD-10-CM | POA: Insufficient documentation

## 2014-10-11 DIAGNOSIS — G43909 Migraine, unspecified, not intractable, without status migrainosus: Secondary | ICD-10-CM | POA: Insufficient documentation

## 2014-10-11 LAB — URINALYSIS, ROUTINE W REFLEX MICROSCOPIC
Bilirubin Urine: NEGATIVE
Glucose, UA: 250 mg/dL — AB
Hgb urine dipstick: NEGATIVE
Ketones, ur: NEGATIVE mg/dL
Leukocytes, UA: NEGATIVE
Nitrite: NEGATIVE
Protein, ur: 30 mg/dL — AB
Specific Gravity, Urine: 1.036 — ABNORMAL HIGH (ref 1.005–1.030)
Urobilinogen, UA: 1 mg/dL (ref 0.0–1.0)
pH: 6 (ref 5.0–8.0)

## 2014-10-11 LAB — CBC WITH DIFFERENTIAL/PLATELET
Basophils Absolute: 0.1 10*3/uL (ref 0.0–0.1)
Basophils Relative: 0 % (ref 0–1)
Eosinophils Absolute: 0.4 10*3/uL (ref 0.0–0.7)
Eosinophils Relative: 3 % (ref 0–5)
HCT: 40.7 % (ref 36.0–46.0)
Hemoglobin: 13.5 g/dL (ref 12.0–15.0)
Lymphocytes Relative: 37 % (ref 12–46)
Lymphs Abs: 4.6 10*3/uL — ABNORMAL HIGH (ref 0.7–4.0)
MCH: 29.2 pg (ref 26.0–34.0)
MCHC: 33.2 g/dL (ref 30.0–36.0)
MCV: 88.1 fL (ref 78.0–100.0)
Monocytes Absolute: 0.7 10*3/uL (ref 0.1–1.0)
Monocytes Relative: 6 % (ref 3–12)
Neutro Abs: 6.7 10*3/uL (ref 1.7–7.7)
Neutrophils Relative %: 54 % (ref 43–77)
Platelets: 275 10*3/uL (ref 150–400)
RBC: 4.62 MIL/uL (ref 3.87–5.11)
RDW: 13.7 % (ref 11.5–15.5)
WBC: 12.5 10*3/uL — ABNORMAL HIGH (ref 4.0–10.5)

## 2014-10-11 LAB — URINE MICROSCOPIC-ADD ON

## 2014-10-11 LAB — COMPREHENSIVE METABOLIC PANEL
ALT: 15 U/L (ref 14–54)
AST: 24 U/L (ref 15–41)
Albumin: 3.9 g/dL (ref 3.5–5.0)
Alkaline Phosphatase: 59 U/L (ref 38–126)
Anion gap: 11 (ref 5–15)
BUN: 9 mg/dL (ref 6–20)
CO2: 22 mmol/L (ref 22–32)
Calcium: 9.1 mg/dL (ref 8.9–10.3)
Chloride: 102 mmol/L (ref 101–111)
Creatinine, Ser: 0.71 mg/dL (ref 0.44–1.00)
GFR calc Af Amer: >60 mL/min (ref >60)
GFR calc non Af Amer: >60 mL/min (ref >60)
Glucose, Bld: 315 mg/dL — ABNORMAL HIGH (ref 65–99)
Potassium: 3.3 mmol/L — ABNORMAL LOW (ref 3.5–5.1)
Sodium: 135 mmol/L (ref 135–145)
Total Bilirubin: 0.3 mg/dL (ref 0.3–1.2)
Total Protein: 7.5 g/dL (ref 6.5–8.1)

## 2014-10-11 LAB — I-STAT TROPONIN, ED: Troponin i, poc: 0 ng/mL (ref 0.00–0.08)

## 2014-10-11 LAB — LIPASE, BLOOD: Lipase: 31 U/L (ref 22–51)

## 2014-10-11 MED ORDER — DIPHENHYDRAMINE HCL 50 MG/ML IJ SOLN
25.0000 mg | Freq: Once | INTRAMUSCULAR | Status: AC
  Administered 2014-10-11: 25 mg via INTRAVENOUS
  Filled 2014-10-11: qty 1

## 2014-10-11 MED ORDER — SODIUM CHLORIDE 0.9 % IV BOLUS (SEPSIS)
1000.0000 mL | Freq: Once | INTRAVENOUS | Status: AC
  Administered 2014-10-11: 1000 mL via INTRAVENOUS

## 2014-10-11 MED ORDER — METOCLOPRAMIDE HCL 5 MG/ML IJ SOLN
10.0000 mg | Freq: Once | INTRAMUSCULAR | Status: AC
  Administered 2014-10-11: 10 mg via INTRAVENOUS
  Filled 2014-10-11: qty 2

## 2014-10-11 NOTE — ED Provider Notes (Signed)
CSN: [id number]     Arrival date & time 10/11/14  1647 History   First MD Initiated Contact with Patient 10/11/14 1741     Chief Complaint  Patient presents with  . Migraine  . Abdominal Pain     (Consider location/radiation/quality/duration/timing/severity/associated sxs/prior Treatment) HPI Comments: Patient is a 49 yo F PMHx significant for DM, HTN, migraine, tobacco abuse presenting to the emergency department for evaluation of 2 complaints. First complaint is 3 day history of gradually worsening throbbing headache. Patient states the pain is behind her eyes and wraps around to the back of her head. She endorses some photophobia. She's tried taking Excedrin with little no improvement. She states she's been out of her migraine prophylactic medications. Endorses this feels like previous migraine headaches. Patient is also complaining of left-sided upper abdominal pain. She denies that she's had any nausea or vomiting does endorse occasional cough. No modifying factors identified. Denies any fevers, tick bites, rashes, urinary symptoms, diarrhea, constipation, vaginal bleeding or discharge.  Patient is a 49 y.o. female presenting with migraines and abdominal pain.  Migraine Associated symptoms include abdominal pain and headaches. Pertinent negatives include no nausea or vomiting.  Abdominal Pain Associated symptoms: no nausea and no vomiting     Past Medical History  Diagnosis Date  . Diabetes mellitus   . Hypertension   . Migraine   . Sinusitis    Past Surgical History  Procedure Laterality Date  . Abdominal hysterectomy    . Anterior cruciate ligament repair     Family History  Problem Relation Age of Onset  . Heart Problems Father    History  Substance Use Topics  . Smoking status: Current Every Day Smoker -- 0.50 packs/day    Types: Cigarettes  . Smokeless tobacco: Never Used  . Alcohol Use: No   OB History    No data available     Review of Systems  Eyes:  Positive for photophobia.  Gastrointestinal: Positive for abdominal pain. Negative for nausea and vomiting.  Neurological: Positive for headaches.  All other systems reviewed and are negative.     Allergies  Review of patient's allergies indicates no known allergies.  Home Medications   Prior to Admission medications   Medication Sig Start Date End Date Taking? Authorizing Provider  ALPRAZolam Duanne Moron) 0.5 MG tablet Take 0.5 mg by mouth at bedtime as needed for anxiety or sleep.  12/23/13  Yes Historical Provider, MD  amLODipine (NORVASC) 10 MG tablet Take 1 tablet (10 mg total) by mouth daily. 06/22/14  Yes Ernestina Patches, MD  aspirin-acetaminophen-caffeine (EXCEDRIN MIGRAINE) [phone] MG per tablet Take 2 tablets by mouth 2 (two) times daily as needed for headache or migraine. 06/22/14  Yes Ernestina Patches, MD  cyclobenzaprine (FLEXERIL) 10 MG tablet Take 1 tablet (10 mg total) by mouth 2 (two) times daily as needed for muscle spasms. 06/22/14  Yes Ernestina Patches, MD  fish oil-omega-3 fatty acids 1000 MG capsule Take 2 capsules (2 g total) by mouth daily. 06/22/14  Yes Ernestina Patches, MD  fluticasone (FLONASE) 50 MCG/ACT nasal spray Place 2 sprays into both nostrils daily. 06/22/14  Yes Ernestina Patches, MD  glipiZIDE (GLUCOTROL XL) 5 MG 24 hr tablet Take 1 tablet (5 mg total) by mouth daily. 06/22/14 06/22/15 Yes Ernestina Patches, MD  HYDROcodone-acetaminophen (NORCO/VICODIN) 5-325 MG per tablet Take 2 tablets by mouth every 4 (four) hours as needed. 03/07/14  Yes Hollace Kinnier Sofia, PA-C  Insulin Glargine (LANTUS SOLOSTAR) 100 UNIT/ML Solostar Pen Inject 20  Units into the skin daily at 10 pm. 06/22/14  Yes Ernestina Patches, MD  ipratropium (ATROVENT) 0.06 % nasal spray Place 2 sprays into both nostrils 4 (four) times daily. 08/20/14  Yes Melony Overly, MD  Lancets [facility] ULTRASOFT) lancets Use as instructed 06/22/14  Yes Ernestina Patches, MD  lisinopril (PRINIVIL,ZESTRIL) 20 MG tablet Take 1 tablet (20 mg  total) by mouth daily. 06/22/14  Yes Ernestina Patches, MD  metFORMIN (GLUCOPHAGE) 500 MG tablet Take 2 tablets (1,000 mg total) by mouth 2 (two) times daily with a meal. 2 tabs for 1000mg  dose 06/22/14  Yes Ernestina Patches, MD  methocarbamol (ROBAXIN) 500 MG tablet Take 1 tablet (500 mg total) by mouth at bedtime as needed for muscle spasms. 06/22/14  Yes Ernestina Patches, MD  naproxen (NAPROSYN) 500 MG tablet Take 1 tablet (500 mg total) by mouth 2 (two) times daily. 06/22/14  Yes Ernestina Patches, MD  nortriptyline (PAMELOR) 10 MG capsule Take 3 capsules (30 mg total) by mouth at bedtime. 06/22/14  Yes Ernestina Patches, MD  ondansetron (ZOFRAN) 4 MG tablet Take 1 tablet (4 mg total) by mouth every 8 (eight) hours as needed for nausea or vomiting. 12/11/13  Yes Wendie Agreste, MD  pravastatin (PRAVACHOL) 40 MG tablet Take 1 tablet (40 mg total) by mouth daily. 06/22/14  Yes Ernestina Patches, MD  propranolol (INDERAL) 80 MG tablet Take 1 tablet (80 mg total) by mouth daily. 06/22/14  Yes Ernestina Patches, MD  rizatriptan (MAXALT-MLT) 10 MG disintegrating tablet Take 1 tablet (10 mg total) by mouth as needed for migraine. May repeat in 2 hours if needed 06/22/14  Yes Ernestina Patches, MD  sertraline (ZOLOFT) 50 MG tablet Take 1 tablet (50 mg total) by mouth daily. 06/22/14  Yes Ernestina Patches, MD  guaiFENesin (MUCINEX) 600 MG 12 hr tablet Take 1 tablet (600 mg total) by mouth 2 (two) times daily. Patient not taking: Reported on 10/11/2014 08/20/14   Melony Overly, MD   BP 187/85 mmHg  Pulse 102  Temp(Src) 97.9 F (36.6 C) (Oral)  Resp 20  SpO2 100% Physical Exam  Constitutional: She is oriented to person, place, and time. She appears well-developed and well-nourished. No distress.  HENT:  Head: Normocephalic and atraumatic.  Right Ear: External ear normal.  Left Ear: External ear normal.  Nose: Nose normal.  Mouth/Throat: Oropharynx is clear and moist. No oropharyngeal exudate.  Eyes: Conjunctivae and EOM are  normal. Pupils are equal, round, and reactive to light.  Neck: Normal range of motion. Neck supple.  Cardiovascular: Normal rate, regular rhythm, normal heart sounds and intact distal pulses.   Pulmonary/Chest: Effort normal and breath sounds normal. No respiratory distress.  Abdominal: Soft. Bowel sounds are normal. There is no tenderness.    Neurological: She is alert and oriented to person, place, and time. She has normal strength. No cranial nerve deficit. Gait normal. GCS eye subscore is 4. GCS verbal subscore is 5. GCS motor subscore is 6.  Sensation grossly intact.  No pronator drift.  Bilateral heel-knee-shin intact.  Skin: Skin is warm and dry. She is not diaphoretic.  Nursing note and vitals reviewed.   ED Course  Procedures (including critical care time) Labs Review Labs Reviewed  CBC WITH DIFFERENTIAL/PLATELET - Abnormal; Notable for the following:    WBC 12.5 (*)    Lymphs Abs 4.6 (*)    All other components within normal limits  COMPREHENSIVE METABOLIC PANEL - Abnormal; Notable for the following:    Potassium 3.3 (*)  Glucose, Bld 315 (*)    All other components within normal limits  URINALYSIS, ROUTINE W REFLEX MICROSCOPIC (NOT AT [facility]) - Abnormal; Notable for the following:    Specific Gravity, Urine 1.036 (*)    Glucose, UA 250 (*)    Protein, ur 30 (*)    All other components within normal limits  URINE MICROSCOPIC-ADD ON - Abnormal; Notable for the following:    Bacteria, UA MANY (*)    Crystals CA OXALATE CRYSTALS (*)    All other components within normal limits  URINE CULTURE  LIPASE, BLOOD  I-STAT TROPOININ, ED    Imaging Review No results found.   EKG Interpretation None      MDM   Final diagnoses:  Migraine without aura and without status migrainosus, not intractable    Filed Vitals:   10/11/14 1655  BP: 187/85  Pulse: 102  Temp: 97.9 F (36.6 C)  Resp: 20   Afebrile, NAD, non-toxic appearing, AAOx4.   I have reviewed nursing  notes, vital signs, and all appropriate lab and imaging results if ordered as above.   Given cough plan to obtain chest x-ray for evaluation of possible pneumonia respiratory illness with referred pain. Abdominal exam is otherwise unremarkable. No neurofocal deficits on examination. Plan to give Benadryl and Reglan for migraine relief. Presentation is like pts typical HA and non concerning for [facility], ICH, Meningitis, or temporal arteritis. Pt is afebrile with no focal neuro deficits, nuchal rigidity, or change in vision.   Patient eloped from the ED prior to receiving CXR. IV was found thrown on the ground and room was empty.    Baron Sane, PA-C 10/11/14 2008  Ernestina Patches, MD 10/14/14 [phone]

## 2014-10-11 NOTE — ED Notes (Signed)
Pt called for tech, entered room and found gown on bed, IV d/c'd by patient, pt left the ED without notifying staff.

## 2014-10-11 NOTE — ED Notes (Signed)
Pt c/o migraine, LUQ abdominal pain, and back pain, onset of all symptoms 3 days ago. Denies n/v/diarrhea.

## 2014-10-13 ENCOUNTER — Ambulatory Visit (INDEPENDENT_AMBULATORY_CARE_PROVIDER_SITE_OTHER): Payer: Self-pay | Admitting: Family Medicine

## 2014-10-13 VITALS — BP 143/90 | HR 91 | Temp 98.0°F | Resp 16 | Ht 66.0 in | Wt 234.4 lb

## 2014-10-13 DIAGNOSIS — IMO0002 Reserved for concepts with insufficient information to code with codable children: Secondary | ICD-10-CM

## 2014-10-13 DIAGNOSIS — F329 Major depressive disorder, single episode, unspecified: Secondary | ICD-10-CM

## 2014-10-13 DIAGNOSIS — F419 Anxiety disorder, unspecified: Secondary | ICD-10-CM

## 2014-10-13 DIAGNOSIS — I10 Essential (primary) hypertension: Secondary | ICD-10-CM

## 2014-10-13 DIAGNOSIS — M5431 Sciatica, right side: Secondary | ICD-10-CM

## 2014-10-13 DIAGNOSIS — E785 Hyperlipidemia, unspecified: Secondary | ICD-10-CM

## 2014-10-13 DIAGNOSIS — E1165 Type 2 diabetes mellitus with hyperglycemia: Secondary | ICD-10-CM

## 2014-10-13 DIAGNOSIS — F32A Depression, unspecified: Secondary | ICD-10-CM

## 2014-10-13 DIAGNOSIS — J209 Acute bronchitis, unspecified: Secondary | ICD-10-CM

## 2014-10-13 LAB — URINE CULTURE

## 2014-10-13 MED ORDER — CITALOPRAM HYDROBROMIDE 40 MG PO TABS: 40.0000 mg | ORAL_TABLET | Freq: Every day | ORAL | Status: DC

## 2014-10-13 MED ORDER — BUSPIRONE HCL 10 MG PO TABS: 10.0000 mg | ORAL_TABLET | Freq: Two times a day (BID) | ORAL | Status: DC

## 2014-10-13 MED ORDER — METFORMIN HCL 1000 MG PO TABS: 1000.0000 mg | ORAL_TABLET | Freq: Two times a day (BID) | ORAL | Status: DC

## 2014-10-13 MED ORDER — GLIPIZIDE ER 5 MG PO TB24: 5.0000 mg | ORAL_TABLET | Freq: Every day | ORAL | Status: DC

## 2014-10-13 MED ORDER — LISINOPRIL 20 MG PO TABS: 20.0000 mg | ORAL_TABLET | Freq: Every day | ORAL | Status: DC

## 2014-10-13 MED ORDER — LOVASTATIN 20 MG PO TABS: 20.0000 mg | ORAL_TABLET | Freq: Every day | ORAL | Status: DC

## 2014-10-13 MED ORDER — CYCLOBENZAPRINE HCL 10 MG PO TABS: 10.0000 mg | ORAL_TABLET | Freq: Every day | ORAL | Status: DC

## 2014-10-13 MED ORDER — AMOXICILLIN 500 MG PO CAPS: 1000.0000 mg | ORAL_CAPSULE | Freq: Two times a day (BID) | ORAL | Status: DC

## 2014-10-13 NOTE — Patient Instructions (Addendum)
We are going to get you back on your medications-  Metformin: take a 1/2 tablet twice a day and taper up to a whole tablet twice a day over 2 weeks Lisinopril: start back Lovastatin: start back Buspar (anxiety)- 1/2 tablet twice a day, increase to whole twice a day over 2 weeks Amoxicillin (bronchitis)- take until gone Flexeril (sciatica): one at bedtime.  This will make you feel sleepy Celexa- take 1/2 tablet for one week, then go to a whole  Look into the [facility]- this may be a cheaper way for your to get your meds and [place] ?   Address: [address], [place], [place] [address]  Phone: [phone]

## 2014-10-13 NOTE — Progress Notes (Signed)
[facility] [address], [place] [address] [phone]  Date:  10/13/2014   Name:  Catherine Pope   DOB:  1965/07/23   MRN:  [id number]  PCP:  Lamar Blinks, MD    Chief Complaint: Cough; chest congestion; Shortness of Breath; Medication Refill; and other   History of Present Illness:  Catherine Pope is a 49 y.o. very pleasant female patient who presents with the following:  Last seen by myself about 9 months ago for follow-up of her DM.  At that time I had asked her to come back and see me in 2 months for follow-up. However she lost her insurance about a month after our last visit. She could not afford her insurnace, but she cannot qualify for medicaid.  She has not worked in about 18 months.  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  She went to the ER 2 days ago but left AMA. She states that she had a panic attack, pulled the IV out of her arm and left the hospital  Notes that she has had "a cold for 2 months and it's not getting better," and she is having headaches. This seemed to start with the change in weather. She was seen at another clinic and tried mucinex and asal spray.  She now feels like her sx are more in her chest, she has a produtive cough.  The cough can be really severe at times.   She is not having fevers  She states that she has been out of her medications for 3 months now.  She cannot afford many of her old medications, especially the lantus.  She is still checking her sugar at times and it is not getting to 300  She is getting food stamps, no other assistance at this time.  She lives with her mother and her daughter    She denies any SI, but admits that her situation has her depressed and anxious.   She also notes that she is having sciatic nerve pain down her right leg.  No numbness or weakness, just the pain.     Lab Results  Component Value Date   HGBA1C 10.3* 01/05/2014      Patient Active Problem List   Diagnosis Date Noted  . Type II or unspecified type diabetes mellitus with unspecified complication, uncontrolled 11/26/2013  . Morbid obesity 11/26/2013  . Migraine without aura 11/26/2013  . Other and unspecified hyperlipidemia 11/26/2013    Past Medical History  Diagnosis Date  . Diabetes mellitus   . Hypertension   . Migraine   . Sinusitis     Past Surgical History  Procedure Laterality Date  . Abdominal hysterectomy    . Anterior cruciate ligament repair      History  Substance Use Topics  . Smoking status: Current Every Day Smoker -- 0.50 packs/day    Types: Cigarettes  . Smokeless tobacco: Never Used  . Alcohol Use: No    Family History  Problem Relation Age of Onset  . Heart Problems Father     No Known Allergies  Medication list has been reviewed and updated.  Current Outpatient Prescriptions on File Prior to Visit  Medication Sig Dispense Refill  . ALPRAZolam (XANAX) 0.5 MG tablet Take 0.5 mg by mouth at bedtime as needed for anxiety or sleep.     Marland Kitchen amLODipine (NORVASC) 10 MG tablet Take 1 tablet (10 mg total) by mouth  daily. 30 tablet 1  . cyclobenzaprine (FLEXERIL) 10 MG tablet Take 1 tablet (10 mg total) by mouth 2 (two) times daily as needed for muscle spasms. 20 tablet 0  . fish oil-omega-3 fatty acids 1000 MG capsule Take 2 capsules (2 g total) by mouth daily. 30 capsule 1  . fluticasone (FLONASE) 50 MCG/ACT nasal spray Place 2 sprays into both nostrils daily. 16 g 1  . glipiZIDE (GLUCOTROL XL) 5 MG 24 hr tablet Take 1 tablet (5 mg total) by mouth daily. 30 tablet 0  . HYDROcodone-acetaminophen (NORCO/VICODIN) 5-325 MG per tablet Take 2 tablets by mouth every 4 (four) hours as needed. 16 tablet 0  . Insulin Glargine (LANTUS SOLOSTAR) 100 UNIT/ML Solostar Pen Inject 20 Units into the skin daily at 10 pm. 1 pen PRN  . ipratropium (ATROVENT) 0.06 % nasal spray Place 2 sprays into both nostrils 4 (four) times daily. 15  mL 0  . Lancets (ONETOUCH ULTRASOFT) lancets Use as instructed 100 each 12  . lisinopril (PRINIVIL,ZESTRIL) 20 MG tablet Take 1 tablet (20 mg total) by mouth daily. 30 tablet 1  . metFORMIN (GLUCOPHAGE) 500 MG tablet Take 2 tablets (1,000 mg total) by mouth 2 (two) times daily with a meal. 2 tabs for 1000mg  dose 60 tablet 1  . methocarbamol (ROBAXIN) 500 MG tablet Take 1 tablet (500 mg total) by mouth at bedtime as needed for muscle spasms. 30 tablet 1  . naproxen (NAPROSYN) 500 MG tablet Take 1 tablet (500 mg total) by mouth 2 (two) times daily. 30 tablet 0  . nortriptyline (PAMELOR) 10 MG capsule Take 3 capsules (30 mg total) by mouth at bedtime. 30 capsule 1  . ondansetron (ZOFRAN) 4 MG tablet Take 1 tablet (4 mg total) by mouth every 8 (eight) hours as needed for nausea or vomiting. 10 tablet 0  . pravastatin (PRAVACHOL) 40 MG tablet Take 1 tablet (40 mg total) by mouth daily. 30 tablet 1  . propranolol (INDERAL) 80 MG tablet Take 1 tablet (80 mg total) by mouth daily. 30 tablet 1  . rizatriptan (MAXALT-MLT) 10 MG disintegrating tablet Take 1 tablet (10 mg total) by mouth as needed for migraine. May repeat in 2 hours if needed 15 tablet 11  . sertraline (ZOLOFT) 50 MG tablet Take 1 tablet (50 mg total) by mouth daily. 30 tablet 1  . aspirin-acetaminophen-caffeine (EXCEDRIN MIGRAINE) [id number] MG per tablet Take 2 tablets by mouth 2 (two) times daily as needed for headache or migraine. (Patient not taking: Reported on 10/13/2014) 30 tablet 1  . guaiFENesin (MUCINEX) 600 MG 12 hr tablet Take 1 tablet (600 mg total) by mouth 2 (two) times daily. (Patient not taking: Reported on 10/11/2014) 30 tablet 0   No current facility-administered medications on file prior to visit.    Review of Systems:  As per HPI- otherwise negative.   Physical Examination: Filed Vitals:   10/13/14 0811  BP: 143/90  Pulse: 91  Temp: 98 F (36.7 C)  Resp: 16   Filed Vitals:   10/13/14 0811  Height: 5\' 6"   (1.676 m)  Weight: 234 lb 6.4 oz (106.323 kg)   Body mass index is 37.85 kg/(m^2). Ideal Body Weight: Weight in (lb) to have BMI = 25: 154.6  GEN: WDWN, NAD, Non-toxic, A & O x 3, obese, looks well HEENT: Atraumatic, Normocephalic. Neck supple. No masses, No LAD.  Bilateral TM wnl, oropharynx normal.  PEERL,EOMI.   Ears and Nose: No external deformity. CV: RRR, No M/G/R. No JVD.  No thrill. No extra heart sounds. PULM: CTA B, no wheezes, crackles, rhonchi. No retractions. No resp. distress. No accessory muscle use. ABD: S, NT, ND EXTR: No c/c/e NEURO Normal gait.  PSYCH: Normally interactive. Conversant. Not depressed or anxious appearing.  Calm demeanor.    Assessment and Plan: Diabetes mellitus type 2, uncontrolled - Plan: metFORMIN (GLUCOPHAGE) 1000 MG tablet, glipiZIDE (GLUCOTROL XL) 5 MG 24 hr tablet  Essential hypertension - Plan: lisinopril (PRINIVIL,ZESTRIL) 20 MG tablet  Dyslipidemia - Plan: lovastatin (MEVACOR) 20 MG tablet  Depression - Plan: citalopram (CELEXA) 40 MG tablet, busPIRone (BUSPAR) 10 MG tablet  Anxiety - Plan: busPIRone (BUSPAR) 10 MG tablet  Acute bronchitis, unspecified organism - Plan: amoxicillin (AMOXIL) 500 MG capsule  Sciatica, right - Plan: cyclobenzaprine (FLEXERIL) 10 MG tablet  Went over her medications and found substitutes on the $4 list wherever possible.  See pt instructions for details.   Referred her to the [facility] center as a possibly more affordable option for her care Will treat her with amoxicillin for sinus and chest sx for 2 months.   Treat sciatica with flexeril- avoid prednisone given her DM  Signed Lamar Blinks, MD

## 2014-11-03 ENCOUNTER — Telehealth: Payer: Self-pay

## 2014-11-03 NOTE — Telephone Encounter (Signed)
She has a neurologist following her for headaches.

## 2014-11-03 NOTE — Telephone Encounter (Signed)
Pt states she is having a migraine and would like the Dr to call her something in. Please call pt at [place]

## 2014-11-04 NOTE — Telephone Encounter (Signed)
Spoke with pt, she states she does not see her neurologist anymore due to her not having insurance anymore. She would like Dr.Copland to call her in something. She still has a headache today. She states she does not take any medicine in particular. Please advise.

## 2014-11-04 NOTE — Telephone Encounter (Signed)
Called her back and LMOM- I am sorry that I just got her message today.  Will call her back tomorrow to see if I can help

## 2014-11-05 MED ORDER — RIZATRIPTAN BENZOATE 10 MG PO TBDP: 10.0000 mg | ORAL_TABLET | ORAL | Status: DC | PRN

## 2014-11-05 NOTE — Telephone Encounter (Signed)
Called her back and reached her.  She had a migraine HA for 2 days, it went away and now is starting to come back,.  She does not think that she has any maxalt available.  Will RF this for her

## 2014-11-09 ENCOUNTER — Encounter: Payer: Self-pay | Admitting: *Deleted

## 2015-02-22 ENCOUNTER — Emergency Department (HOSPITAL_COMMUNITY)
Admission: EM | Admit: 2015-02-22 | Discharge: 2015-02-22 | Disposition: A | Discharge status: Home / Self Care | Payer: Self-pay | Attending: Physician Assistant | Admitting: Physician Assistant

## 2015-02-22 ENCOUNTER — Emergency Department (HOSPITAL_COMMUNITY): Payer: Self-pay

## 2015-02-22 ENCOUNTER — Encounter (HOSPITAL_COMMUNITY): Payer: Self-pay | Admitting: Emergency Medicine

## 2015-02-22 DIAGNOSIS — Y9389 Activity, other specified: Secondary | ICD-10-CM | POA: Insufficient documentation

## 2015-02-22 DIAGNOSIS — Z79899 Other long term (current) drug therapy: Secondary | ICD-10-CM | POA: Insufficient documentation

## 2015-02-22 DIAGNOSIS — Y998 Other external cause status: Secondary | ICD-10-CM | POA: Insufficient documentation

## 2015-02-22 DIAGNOSIS — Z791 Long term (current) use of non-steroidal anti-inflammatories (NSAID): Secondary | ICD-10-CM | POA: Insufficient documentation

## 2015-02-22 DIAGNOSIS — S8991XA Unspecified injury of right lower leg, initial encounter: Secondary | ICD-10-CM | POA: Insufficient documentation

## 2015-02-22 DIAGNOSIS — W010XXA Fall on same level from slipping, tripping and stumbling without subsequent striking against object, initial encounter: Secondary | ICD-10-CM | POA: Insufficient documentation

## 2015-02-22 DIAGNOSIS — E119 Type 2 diabetes mellitus without complications: Secondary | ICD-10-CM | POA: Insufficient documentation

## 2015-02-22 DIAGNOSIS — G43909 Migraine, unspecified, not intractable, without status migrainosus: Secondary | ICD-10-CM | POA: Insufficient documentation

## 2015-02-22 DIAGNOSIS — Y9289 Other specified places as the place of occurrence of the external cause: Secondary | ICD-10-CM | POA: Insufficient documentation

## 2015-02-22 DIAGNOSIS — I10 Essential (primary) hypertension: Secondary | ICD-10-CM | POA: Insufficient documentation

## 2015-02-22 DIAGNOSIS — Z7951 Long term (current) use of inhaled steroids: Secondary | ICD-10-CM | POA: Insufficient documentation

## 2015-02-22 DIAGNOSIS — M25561 Pain in right knee: Secondary | ICD-10-CM

## 2015-02-22 DIAGNOSIS — F17210 Nicotine dependence, cigarettes, uncomplicated: Secondary | ICD-10-CM | POA: Insufficient documentation

## 2015-02-22 DIAGNOSIS — Z8709 Personal history of other diseases of the respiratory system: Secondary | ICD-10-CM | POA: Insufficient documentation

## 2015-02-22 MED ORDER — IBUPROFEN 800 MG PO TABS: 800.0000 mg | ORAL_TABLET | Freq: Three times a day (TID) | ORAL | Status: DC

## 2015-02-22 MED ORDER — IBUPROFEN 800 MG PO TABS
800.0000 mg | ORAL_TABLET | Freq: Once | ORAL | Status: AC
  Administered 2015-02-22: 800 mg via ORAL
  Filled 2015-02-22: qty 1

## 2015-02-22 MED ORDER — ACETAMINOPHEN 325 MG PO TABS
650.0000 mg | ORAL_TABLET | Freq: Once | ORAL | Status: AC
  Administered 2015-02-22: 650 mg via ORAL
  Filled 2015-02-22: qty 2

## 2015-02-22 NOTE — ED Provider Notes (Signed)
CSN: [id number]     Arrival date & time 02/22/15  1455 History  By signing my name below, I, Rayna Sexton, attest that this documentation has been prepared under the direction and in the presence of Gloriann Loan, PA-C. Electronically Signed: Rayna Sexton, ED Scribe. 02/22/2015. 3:45 PM.   Chief Complaint  Patient presents with  . Knee Pain   The history is provided by the patient. No language interpreter was used.    HPI Comments: Catherine Pope is a 49 y.o. female with a SHx to her right ACL who presents to the Emergency Department complaining of a fall that occurred 1 week ago. Pt notes that she tripped on a concrete curb which cause an abnormal movement to her right knee. She notes associated, mild, right knee pain that radiates to her right foot, mild paraesthesia in her right leg and mild right leg weakness. Pt notes worsening pain with movement, bearing weight or when ambulating. She notes taking Tylenol Arthritis for pain management but denies any relief. Pt notes a hx of HTN and confirms she forgot to take her medication today (BP 183/88 in triage). She denies joint swelling and numbness.   Past Medical History  Diagnosis Date  . Diabetes mellitus   . Hypertension   . Migraine   . Sinusitis    Past Surgical History  Procedure Laterality Date  . Abdominal hysterectomy    . Anterior cruciate ligament repair     Family History  Problem Relation Age of Onset  . Heart Problems Father    Social History  Substance Use Topics  . Smoking status: Current Every Day Smoker -- 0.50 packs/day    Types: Cigarettes  . Smokeless tobacco: Never Used  . Alcohol Use: No   OB History    No data available     Review of Systems A complete 10 system review of systems was obtained and all systems are negative except as noted in the HPI and PMH.  Allergies  Review of patient's allergies indicates no known allergies.  Home Medications   Prior to Admission medications   Medication Sig  Start Date End Date Taking? Authorizing Provider  busPIRone (BUSPAR) 10 MG tablet Take 1 tablet (10 mg total) by mouth 2 (two) times daily. Start with 1/2 tablet twice a day and increase over 2 weeks 10/13/14   Darreld Mclean, MD  citalopram (CELEXA) 40 MG tablet Take 1 tablet (40 mg total) by mouth daily. Start with 1/2 tablet for one week 10/13/14   Darreld Mclean, MD  cyclobenzaprine (FLEXERIL) 10 MG tablet Take 1 tablet (10 mg total) by mouth at bedtime. 10/13/14   Gay Filler Copland, MD  fish oil-omega-3 fatty acids 1000 MG capsule Take 2 capsules (2 g total) by mouth daily. 06/22/14   Ernestina Patches, MD  fluticasone (FLONASE) 50 MCG/ACT nasal spray Place 2 sprays into both nostrils daily. 06/22/14   Ernestina Patches, MD  glipiZIDE (GLUCOTROL XL) 5 MG 24 hr tablet Take 1 tablet (5 mg total) by mouth daily. 10/13/14 10/13/15  Gay Filler Copland, MD  guaiFENesin (MUCINEX) 600 MG 12 hr tablet Take 1 tablet (600 mg total) by mouth 2 (two) times daily. Patient not taking: Reported on 10/11/2014 08/20/14   Melony Overly, MD  HYDROcodone-acetaminophen (NORCO/VICODIN) 5-325 MG per tablet Take 2 tablets by mouth every 4 (four) hours as needed. 03/07/14   Fransico Meadow, PA-C  ibuprofen (ADVIL,MOTRIN) 800 MG tablet Take 1 tablet (800 mg total) by mouth 3 (  three) times daily. 02/22/15   Carleta Woodrow, PA-C  ipratropium (ATROVENT) 0.06 % nasal spray Place 2 sprays into both nostrils 4 (four) times daily. 08/20/14   Melony Overly, MD  Lancets (ONETOUCH ULTRASOFT) lancets Use as instructed 06/22/14   Ernestina Patches, MD  lisinopril (PRINIVIL,ZESTRIL) 20 MG tablet Take 1 tablet (20 mg total) by mouth daily. 10/13/14   Gay Filler Copland, MD  lovastatin (MEVACOR) 20 MG tablet Take 1 tablet (20 mg total) by mouth at bedtime. 10/13/14   Darreld Mclean, MD  metFORMIN (GLUCOPHAGE) 1000 MG tablet Take 1 tablet (1,000 mg total) by mouth 2 (two) times daily with a meal. 10/13/14   Gay Filler Copland, MD  naproxen (NAPROSYN) 500 MG  tablet Take 1 tablet (500 mg total) by mouth 2 (two) times daily. 06/22/14   Ernestina Patches, MD  ondansetron (ZOFRAN) 4 MG tablet Take 1 tablet (4 mg total) by mouth every 8 (eight) hours as needed for nausea or vomiting. 12/11/13   Wendie Agreste, MD  rizatriptan (MAXALT-MLT) 10 MG disintegrating tablet Take 1 tablet (10 mg total) by mouth as needed for migraine. May repeat in 2 hours if needed 11/05/14   Gay Filler Copland, MD   BP 183/88 mmHg  Pulse 110  Temp(Src) 98.5 F (36.9 C) (Oral)  Resp 16  SpO2 99% Physical Exam  Constitutional: She is oriented to person, place, and time. She appears well-developed and well-nourished.  HENT:  Head: Normocephalic and atraumatic.  Mouth/Throat: No oropharyngeal exudate.  Neck: Normal range of motion. No tracheal deviation present.  Cardiovascular: Normal rate, regular rhythm and normal heart sounds.   Pulses:      Posterior tibial pulses are 2+ on the right side, and 2+ on the left side.  Pulmonary/Chest: Effort normal. No respiratory distress.  Abdominal: Soft. There is no tenderness.  Musculoskeletal: Normal range of motion.       Right knee: She exhibits normal range of motion, no swelling, no effusion, no ecchymosis, no deformity, no laceration, no erythema, normal alignment, no LCL laxity, normal patellar mobility and no MCL laxity. Tenderness found. Medial joint line and lateral joint line tenderness noted. No MCL, no LCL and no patellar tendon tenderness noted.  Positive McMurray test.  Negative anterior/posterior drawer test.  Negative ballottement sign.  Compartment soft and compressible.   Neurological: She is alert and oriented to person, place, and time.  Strength and sensation intact bilaterally throughout upper and lower extremities.   Skin: Skin is warm and dry. She is not diaphoretic.  Psychiatric: She has a normal mood and affect. Her behavior is normal.  Nursing note and vitals reviewed.  ED Course  Procedures  DIAGNOSTIC  STUDIES: Oxygen Saturation is 99% on RA, normal by my interpretation.    COORDINATION OF CARE: 3:43 PM Pt presents today due to right knee pain associated with a fall. Discussed treatment plan with pt at bedside including a DG of her right knee, Motrin and reevaluation based on results of the imaging. Pt agreed to plan.  Labs Review Labs Reviewed - No data to display  Imaging Review Dg Knee Complete 4 Views Right  02/22/2015  CLINICAL DATA:  49 year old female with right lateral knee pain after stepping off a curb the wrong weight 2 days previously. History of prior ACL repair. EXAM: RIGHT KNEE - COMPLETE 4+ VIEW COMPARISON:  None. FINDINGS: No evidence of acute fracture, dislocation or joint effusion. Postsurgical changes consistent with prior ACL reconstruction. Mild tricompartmental degenerative changes most  notable in the patellofemoral compartment. Small enthesophyte at the superior patellar pole. IMPRESSION: 1. No evidence of acute fracture, dislocation or knee joint effusion. 2. Surgical changes of prior ACL repair. 3. 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 Electronically Signed   By: Jacqulynn Cadet M.D.   On: 02/22/2015 16:04   I have personally reviewed and evaluated these images as part of my medical decision-making.   EKG Interpretation None      MDM   Final diagnoses:  Right knee pain    Patient presents with right knee pain.  VS show tachycardia, 110 and BP 183/88.  Hx of HTN and patient did not take her medication this morning.  On exam,  Strength and sensation intact bilaterally.  Intact distal pulses.  Right knee with FAROM.  TTP along medial and lateral joint lines.  Positive McMurray test.  Negative anterior/posterior drawer test.  Negative ballottement.  Plain films show no evidence of acute fracture, dislocation, or knee joint effusion.  Surgical changes from prior ACL repair.  Mild tricompartmental  degenerative OA.  Suspect meniscal injury.  Doubt ligamentous injury.  Doubt fracture or dislocation.  Will follow up with orthopedics.   Evaluation does not show pathology requring ongoing emergent intervention or admission. Pt is hemodynamically stable and mentating appropriately. Discussed findings/results and plan with patient/guardian, who agrees with plan. All questions answered. Return precautions discussed and outpatient follow up given.   I personally performed the services described in this documentation, which was scribed in my presence. The recorded information has been reviewed and is accurate.    Gloriann Loan, PA-C 02/22/15 [place], MD 02/23/15 0700

## 2015-02-22 NOTE — Discharge Instructions (Signed)

## 2015-02-22 NOTE — ED Notes (Signed)
Pt states that she has hx of ACL repair on her R knee and is now having R lateral knee pain. States she stepped off of a curb the wrong way recently and thinks she may have injured it. Alert and oriented.

## 2015-04-23 ENCOUNTER — Encounter: Payer: Self-pay | Admitting: Family Medicine

## 2015-04-26 ENCOUNTER — Other Ambulatory Visit: Payer: Self-pay | Admitting: Family Medicine

## 2015-04-28 ENCOUNTER — Encounter: Payer: Self-pay | Admitting: Family Medicine

## 2015-07-20 ENCOUNTER — Encounter (HOSPITAL_COMMUNITY): Payer: Self-pay | Admitting: Emergency Medicine

## 2015-07-20 ENCOUNTER — Emergency Department (HOSPITAL_COMMUNITY)
Admission: EM | Admit: 2015-07-20 | Discharge: 2015-07-21 | Disposition: A | Discharge status: Home / Self Care | Payer: Self-pay | Attending: Emergency Medicine | Admitting: Emergency Medicine

## 2015-07-20 ENCOUNTER — Emergency Department (HOSPITAL_COMMUNITY): Payer: Self-pay

## 2015-07-20 DIAGNOSIS — Y998 Other external cause status: Secondary | ICD-10-CM | POA: Insufficient documentation

## 2015-07-20 DIAGNOSIS — Z7951 Long term (current) use of inhaled steroids: Secondary | ICD-10-CM | POA: Insufficient documentation

## 2015-07-20 DIAGNOSIS — M5441 Lumbago with sciatica, right side: Secondary | ICD-10-CM | POA: Insufficient documentation

## 2015-07-20 DIAGNOSIS — Y9389 Activity, other specified: Secondary | ICD-10-CM | POA: Insufficient documentation

## 2015-07-20 DIAGNOSIS — Z8709 Personal history of other diseases of the respiratory system: Secondary | ICD-10-CM | POA: Insufficient documentation

## 2015-07-20 DIAGNOSIS — G43909 Migraine, unspecified, not intractable, without status migrainosus: Secondary | ICD-10-CM | POA: Insufficient documentation

## 2015-07-20 DIAGNOSIS — F17210 Nicotine dependence, cigarettes, uncomplicated: Secondary | ICD-10-CM | POA: Insufficient documentation

## 2015-07-20 DIAGNOSIS — I10 Essential (primary) hypertension: Secondary | ICD-10-CM | POA: Insufficient documentation

## 2015-07-20 DIAGNOSIS — E119 Type 2 diabetes mellitus without complications: Secondary | ICD-10-CM | POA: Insufficient documentation

## 2015-07-20 DIAGNOSIS — S6992XA Unspecified injury of left wrist, hand and finger(s), initial encounter: Secondary | ICD-10-CM | POA: Insufficient documentation

## 2015-07-20 DIAGNOSIS — Z79899 Other long term (current) drug therapy: Secondary | ICD-10-CM | POA: Insufficient documentation

## 2015-07-20 DIAGNOSIS — Z7984 Long term (current) use of oral hypoglycemic drugs: Secondary | ICD-10-CM | POA: Insufficient documentation

## 2015-07-20 DIAGNOSIS — Y9289 Other specified places as the place of occurrence of the external cause: Secondary | ICD-10-CM | POA: Insufficient documentation

## 2015-07-20 DIAGNOSIS — W228XXA Striking against or struck by other objects, initial encounter: Secondary | ICD-10-CM | POA: Insufficient documentation

## 2015-07-20 DIAGNOSIS — M25532 Pain in left wrist: Secondary | ICD-10-CM

## 2015-07-20 MED ORDER — METHOCARBAMOL 500 MG PO TABS: 500.0000 mg | ORAL_TABLET | Freq: Two times a day (BID) | ORAL | Status: DC | PRN

## 2015-07-20 MED ORDER — NAPROXEN 250 MG PO TABS: 250.0000 mg | ORAL_TABLET | Freq: Two times a day (BID) | ORAL | Status: DC

## 2015-07-20 NOTE — ED Notes (Signed)
Pt sts lower back pain with radiation down right leg and left arm pain with some numbness since hitting hand 5 days ago

## 2015-07-20 NOTE — Discharge Instructions (Signed)
Back Exercises °The following exercises strengthen the muscles that help to support the back. They also help to keep the lower back flexible. Doing these exercises can help to prevent back pain or lessen existing pain. °If you have back pain or discomfort, try doing these exercises 2-3 times each day or as told by your health care provider. When the pain goes away, do them once each day, but increase the number of times that you repeat the steps for each exercise (do more repetitions). If you do not have back pain or discomfort, do these exercises once each day or as told by your health care provider. °EXERCISES °Single Knee to Chest °Repeat these steps 3-5 times for each leg: °· Lie on your back on a firm bed or the floor with your legs extended. °· Bring one knee to your chest. Your other leg should stay extended and in contact with the floor. °· Hold your knee in place by grabbing your knee or thigh. °· Pull on your knee until you feel a gentle stretch in your lower back. °· Hold the stretch for 10-30 seconds. °· Slowly release and straighten your leg. °Pelvic Tilt °Repeat these steps 5-10 times: °· Lie on your back on a firm bed or the floor with your legs extended. °· Bend your knees so they are pointing toward the ceiling and your feet are flat on the floor. °· Tighten your lower abdominal muscles to press your lower back against the floor. This motion will tilt your pelvis so your tailbone points up toward the ceiling instead of pointing to your feet or the floor. °· With gentle tension and even breathing, hold this position for 5-10 seconds. °Cat-Cow °Repeat these steps until your lower back becomes more flexible: °· Get into a hands-and-knees position on a firm surface. Keep your hands under your shoulders, and keep your knees under your hips. You may place padding under your knees for comfort. °· Let your head hang down, and point your tailbone toward the floor so your lower back becomes rounded like the  back of a cat. °· Hold this position for 5 seconds. °· Slowly lift your head and point your tailbone up toward the ceiling so your back forms a sagging arch like the back of a cow. °· Hold this position for 5 seconds. °Press-Ups °Repeat these steps 5-10 times: °· Lie on your abdomen (face-down) on the floor. °· Place your palms near your head, about shoulder-width apart. °· While you keep your back as relaxed as possible and keep your hips on the floor, slowly straighten your arms to raise the top half of your body and lift your shoulders. Do not use your back muscles to raise your upper torso. You may adjust the placement of your hands to make yourself more comfortable. °· Hold this position for 5 seconds while you keep your back relaxed. °· Slowly return to lying flat on the floor. °Bridges °Repeat these steps 10 times: °1. Lie on your back on a firm surface. °2. Bend your knees so they are pointing toward the ceiling and your feet are flat on the floor. °3. Tighten your buttocks muscles and lift your buttocks off of the floor until your waist is at almost the same height as your knees. You should feel the muscles working in your buttocks and the back of your thighs. If you do not feel these muscles, slide your feet 1-2 inches farther away from your buttocks. °4. Hold this position for 3-5   seconds. 5. Slowly lower your hips to the starting position, and allow your buttocks muscles to relax completely. If this exercise is too easy, try doing it with your arms crossed over your chest. Abdominal Crunches Repeat these steps 5-10 times: 1. Lie on your back on a firm bed or the floor with your legs extended. 2. Bend your knees so they are pointing toward the ceiling and your feet are flat on the floor. 3. Cross your arms over your chest. 4. Tip your chin slightly toward your chest without bending your neck. 5. Tighten your abdominal muscles and slowly raise your trunk (torso) high enough to lift your shoulder  blades a tiny bit off of the floor. Avoid raising your torso higher than that, because it can put too much stress on your low back and it does not help to strengthen your abdominal muscles. 6. Slowly return to your starting position. Back Lifts Repeat these steps 5-10 times: 1. Lie on your abdomen (face-down) with your arms at your sides, and rest your forehead on the floor. 2. Tighten the muscles in your legs and your buttocks. 3. Slowly lift your chest off of the floor while you keep your hips pressed to the floor. Keep the back of your head in line with the curve in your back. Your eyes should be looking at the floor. 4. Hold this position for 3-5 seconds. 5. Slowly return to your starting position. SEEK MEDICAL CARE IF:  Your back pain or discomfort gets much worse when you do an exercise.  Your back pain or discomfort does not lessen within 2 hours after you exercise. If you have any of these problems, stop doing these exercises right away. Do not do them again unless your health care provider says that you can. SEEK IMMEDIATE MEDICAL CARE IF:  You develop sudden, severe back pain. If this happens, stop doing the exercises right away. Do not do them again unless your health care provider says that you can.   This information is not intended to replace advice given to you by your health care provider. Make sure you discuss any questions you have with your health care provider.   Document Released: 04/27/2004 Document Revised: 12/09/2014 Document Reviewed: 05/14/2014 Elsevier Interactive Patient Education 2016 [employer] Back Pain, Adult Back pain is very common in adults.The cause of back pain is rarely dangerous and the pain often gets better over time.The cause of your back pain may not be known. Some common causes of back pain include:  Strain of the muscles or ligaments supporting the spine.  Wear and tear (degeneration) of the spinal disks.  Arthritis.  Direct injury to  the back. For many people, back pain may return. Since back pain is rarely dangerous, most people can learn to manage this condition on their own. HOME CARE INSTRUCTIONS Watch your back pain for any changes. The following actions may help to lessen any discomfort you are feeling:  Remain active. It is stressful on your back to sit or stand in one place for long periods of time. Do not sit, drive, or stand in one place for more than 30 minutes at a time. Take short walks on even surfaces as soon as you are able.Try to increase the length of time you walk each day.  Exercise regularly as directed by your health care provider. Exercise helps your back heal faster. It also helps avoid future injury by keeping your muscles strong and flexible.  Do not stay in bed.Resting  more than 1-2 days can delay your recovery.  Pay attention to your body when you bend and lift. The most comfortable positions are those that put less stress on your recovering back. Always use proper lifting techniques, including:  Bending your knees.  Keeping the load close to your body.  Avoiding twisting.  Find a comfortable position to sleep. Use a firm mattress and lie on your side with your knees slightly bent. If you lie on your back, put a pillow under your knees.  Avoid feeling anxious or stressed.Stress increases muscle tension and can worsen back pain.It is important to recognize when you are anxious or stressed and learn ways to manage it, such as with exercise.  Take medicines only as directed by your health care provider. Over-the-counter medicines to reduce pain and inflammation are often the most helpful.Your health care provider may prescribe muscle relaxant drugs.These medicines help dull your pain so you can more quickly return to your normal activities and healthy exercise.  Apply ice to the injured area:  Put ice in a plastic bag.  Place a towel between your skin and the bag.  Leave the ice on  for 20 minutes, 2-3 times a day for the first 2-3 days. After that, ice and heat may be alternated to reduce pain and spasms.  Maintain a healthy weight. Excess weight puts extra stress on your back and makes it difficult to maintain good posture. SEEK MEDICAL CARE IF:  You have pain that is not relieved with rest or medicine.  You have increasing pain going down into the legs or buttocks.  You have pain that does not improve in one week.  You have night pain.  You lose weight.  You have a fever or chills. SEEK IMMEDIATE MEDICAL CARE IF:   You develop new bowel or bladder control problems.  You have unusual weakness or numbness in your arms or legs.  You develop nausea or vomiting.  You develop abdominal pain.  You feel faint.   This information is not intended to replace advice given to you by your health care provider. Make sure you discuss any questions you have with your health care provider.   Document Released: 03/20/2005 Document Revised: 04/10/2014 Document Reviewed: 07/22/2013 Elsevier Interactive Patient Education 2016 [employer] Wrist Pain There are many things that can cause wrist pain. Some common causes include:  An injury to the wrist area, such as a sprain, strain, or fracture.  Overuse of the joint.  A condition that causes increased pressure on a nerve in the wrist (carpal tunnel syndrome).  Wear and tear of the joints that occurs with aging (osteoarthritis).  A variety of other types of arthritis. Sometimes, the cause of wrist pain is not known. The pain often goes away when you follow your health care provider's instructions for relieving pain at home. If your wrist pain continues, tests may need to be done to diagnose your condition. HOME CARE INSTRUCTIONS Pay attention to any changes in your symptoms. Take these actions to help with your pain:  Rest the wrist area for at least 48 hours or as told by your health care provider.  If directed,  apply ice to the injured area:  Put ice in a plastic bag.  Place a towel between your skin and the bag.  Leave the ice on for 20 minutes, 2-3 times per day.  Keep your arm raised (elevated) above the level of your heart while you are sitting or lying down.  If a splint or elastic bandage has been applied, use it as told by your health care provider.  Remove the splint or bandage only as told by your health care provider.  Loosen the splint or bandage if your fingers become numb or have a tingling feeling, or if they turn cold or blue.  Take over-the-counter and prescription medicines only as told by your health care provider.  Keep all follow-up visits as told by your health care provider. This is important. SEEK MEDICAL CARE IF:  Your pain is not helped by treatment.  Your pain gets worse. SEEK IMMEDIATE MEDICAL CARE IF:  Your fingers become swollen.  Your fingers turn white, very red, or cold and blue.  Your fingers are numb or have a tingling feeling.  You have difficulty moving your fingers.   This information is not intended to replace advice given to you by your health care provider. Make sure you discuss any questions you have with your health care provider.   Document Released: 12/28/2004 Document Revised: 12/09/2014 Document Reviewed: 08/05/2014 Elsevier Interactive Patient Education [employer].

## 2015-07-20 NOTE — ED Provider Notes (Signed)
CSN: [id number]     Arrival date & time 07/20/15  1117 History   First MD Initiated Contact with Patient 07/20/15 1333     Chief Complaint  Patient presents with  . Back Pain  . Arm Pain    Catherine Pope is a 50 y.o. female who presents to the ED complaining of low back pain and left wrist pain. Patient reports she's been having 2 weeks of bilateral low back pain that is worse on her right side. She reports it radiates down her posterior buttocks and posterior leg. She reports her pain is worse with movement. She has taken nothing for treatment of her symptoms today. The patient also complains of left wrist pain after she hit her left wrist while getting out of her car 5 days ago. Patient reports initially she had some tingling that then resolved after 15 minutes. She reports now when she touches her hand in certain ways she has some tingling to all of her fingers and thumb. She denies any current numbness, tingling or weakness. She reports some mild pain with palpation of her left wrist but otherwise no pain. She has soaked her hand in Epsom salts with a little relief today. The patient denies any injury or trauma to her back. She denies fevers, numbness, current tingling, weakness, loss of bladder control, loss of bowel control, history of cancer, history IV drug use, rapid changes to her weight, urinary symptoms, or rashes.   Patient is a 50 y.o. female presenting with back pain and arm pain. The history is provided by the patient. No language interpreter was used.  Back Pain Associated symptoms: no abdominal pain, no chest pain, no dysuria, no fever, no headaches, no numbness and no weakness   Arm Pain Associated symptoms include arthralgias. Pertinent negatives include no abdominal pain, chest pain, chills, congestion, coughing, fever, headaches, nausea, neck pain, numbness, rash, sore throat, vomiting or weakness.    Past Medical History  Diagnosis Date  . Diabetes mellitus   .  Hypertension   . Migraine   . Sinusitis    Past Surgical History  Procedure Laterality Date  . Abdominal hysterectomy    . Anterior cruciate ligament repair     Family History  Problem Relation Age of Onset  . Heart Problems Father    Social History  Substance Use Topics  . Smoking status: Current Every Day Smoker -- 0.50 packs/day    Types: Cigarettes  . Smokeless tobacco: Never Used  . Alcohol Use: No   OB History    No data available     Review of Systems  Constitutional: Negative for fever and chills.  HENT: Negative for congestion and sore throat.   Eyes: Negative for visual disturbance.  Respiratory: Negative for cough and shortness of breath.   Cardiovascular: Negative for chest pain.  Gastrointestinal: Negative for nausea, vomiting, abdominal pain and diarrhea.  Genitourinary: Negative for dysuria.  Musculoskeletal: Positive for back pain and arthralgias. Negative for neck pain.  Skin: Negative for color change, rash and wound.  Neurological: Negative for syncope, weakness, light-headedness, numbness and headaches.      Allergies  Review of patient's allergies indicates no known allergies.  Home Medications   Prior to Admission medications   Medication Sig Start Date End Date Taking? Authorizing Provider  citalopram (CELEXA) 40 MG tablet Take 1 tablet (40 mg total) by mouth daily. Start with 1/2 tablet for one week 10/13/14  Yes Gay Filler Copland, MD  fish oil-omega-3 fatty acids  1000 MG capsule Take 2 capsules (2 g total) by mouth daily. 06/22/14  Yes Ernestina Patches, MD  lisinopril (PRINIVIL,ZESTRIL) 20 MG tablet Take 1 tablet (20 mg total) by mouth daily. 10/13/14  Yes Gay Filler Copland, MD  lovastatin (MEVACOR) 20 MG tablet Take 1 tablet (20 mg total) by mouth at bedtime. 10/13/14  Yes Gay Filler Copland, MD  metFORMIN (GLUCOPHAGE) 1000 MG tablet Take 1 tablet (1,000 mg total) by mouth 2 (two) times daily with a meal. 10/13/14  Yes Gay Filler Copland, MD   busPIRone (BUSPAR) 10 MG tablet Take 1 tablet (10 mg total) by mouth 2 (two) times daily. Start with 1/2 tablet twice a day and increase over 2 weeks 10/13/14   Darreld Mclean, MD  fluticasone (FLONASE) 50 MCG/ACT nasal spray Place 2 sprays into both nostrils daily. 06/22/14   Ernestina Patches, MD  glipiZIDE (GLUCOTROL XL) 5 MG 24 hr tablet Take 1 tablet (5 mg total) by mouth daily. 10/13/14 10/13/15  Darreld Mclean, MD  Lancets (ONETOUCH ULTRASOFT) lancets Use as instructed 06/22/14   Ernestina Patches, MD  methocarbamol (ROBAXIN) 500 MG tablet Take 1 tablet (500 mg total) by mouth 2 (two) times daily as needed for muscle spasms. 07/20/15   Waynetta Pean, PA-C  naproxen (NAPROSYN) 250 MG tablet Take 1 tablet (250 mg total) by mouth 2 (two) times daily with a meal. 07/20/15   Waynetta Pean, PA-C   BP 165/79 mmHg  Pulse 77  Temp(Src) 98.2 F (36.8 C) (Oral)  Resp 18  SpO2 99% Physical Exam  Constitutional: She appears well-developed and well-nourished. No distress.  Nontoxic appearing.  HENT:  Head: Normocephalic and atraumatic.  Mouth/Throat: Oropharynx is clear and moist.  Eyes: Conjunctivae are normal. Pupils are equal, round, and reactive to light. Right eye exhibits no discharge. Left eye exhibits no discharge.  Neck: Neck supple.  Cardiovascular: Normal rate, regular rhythm, normal heart sounds and intact distal pulses.  Exam reveals no gallop and no friction rub.   No murmur heard. Bilateral radial and posterior tibialis pulses are intact. Good capillary refill to her distal fingertips bilaterally.  Pulmonary/Chest: Effort normal and breath sounds normal. No respiratory distress. She has no wheezes. She has no rales.  Abdominal: Soft. There is no tenderness.  Musculoskeletal: Normal range of motion. She exhibits tenderness. She exhibits no edema.  Patient has some mild tenderness to the dorsal aspect of her left wrist. No left wrist or hand deformity, ecchymosis or edema. Good range of  motion of her left wrist without difficulty. No left elbow or shoulder tenderness to palpation. No midline neck or back tenderness. Patient has some mild right low back tenderness to palpation. No back edema, ecchymosis or erythema. She has 5 out of 5 strength in her bilateral upper and lower extremities.  Lymphadenopathy:    She has no cervical adenopathy.  Neurological: She is alert. She has normal reflexes. She displays normal reflexes. Coordination normal.  Sensation is intact in her bilateral upper and lower extremities. Normal gait. Bilateral patellar DTRs are intact.  Skin: Skin is warm and dry. No rash noted. She is not diaphoretic. No erythema. No pallor.  Psychiatric: She has a normal mood and affect. Her behavior is normal.  Nursing note and vitals reviewed.   ED Course  Procedures (including critical care time) Labs Review Labs Reviewed - No data to display  Imaging Review Dg Wrist Complete Left  07/20/2015  CLINICAL DATA:  Pain for 5 days after hitting wrist on  gas pump EXAM: LEFT WRIST - COMPLETE 3+ VIEW COMPARISON:  None. FINDINGS: Frontal, oblique, lateral, and ulnar deviation scaphoid images were obtained. There is no fracture or dislocation. The joint spaces appear normal. No erosive change. No intra-articular calcification evident. IMPRESSION: No fracture or dislocation.  No appreciable arthropathy. Electronically Signed   By: Lowella Grip III M.D.   On: 07/20/2015 14:14   I have personally reviewed and evaluated these images as part of my medical decision-making.   EKG Interpretation None     Filed Vitals:   07/20/15 1134 07/20/15 1453  BP: 190/105 165/79  Pulse: 81 77  Temp: 98.3 F (36.8 C) 98.2 F (36.8 C)  TempSrc: Oral Oral  Resp: 18 18  SpO2: 95% 99%    MDM   Meds given in ED:  Medications - No data to display  New Prescriptions   METHOCARBAMOL (ROBAXIN) 500 MG TABLET    Take 1 tablet (500 mg total) by mouth 2 (two) times daily as needed for  muscle spasms.   NAPROXEN (NAPROSYN) 250 MG TABLET    Take 1 tablet (250 mg total) by mouth 2 (two) times daily with a meal.    Final diagnoses:  Left wrist pain  Right-sided low back pain with right-sided sciatica   This is a 50 y.o. female who presents to the ED complaining of low back pain and left wrist pain. Patient reports she's been having 2 weeks of bilateral low back pain that is worse on her right side. She reports it radiates down her posterior buttocks and posterior leg. She reports her pain is worse with movement. She has taken nothing for treatment of her symptoms today. The patient also complains of left wrist pain after she hit her left wrist while getting out of her car 5 days ago. Patient reports initially she had some tingling that then resolved after 15 minutes. She reports now when she touches her hand in certain ways she has some tingling to all of her fingers and thumb. She denies any current numbness, tingling or weakness. She reports some mild pain with palpation of her left wrist but otherwise no pain.  On exam the patient is afebrile nontoxic appearing. She has no focal neurological deficits. Normal gait. She has some mild tenderness to the dorsal aspect of her left wrist. She has good range of motion of her left wrist. No deformity. X-ray of left wrist is unremarkable. No concern for cauda equina. Will discharge with a short course of naproxen and Robaxin. I educated on back exercises. Will provide with a wrist splint for wrist pain and have her follow-up with the [facility] for recheck of her blood pressure. I advised the patient to follow-up with their primary care provider this week. I advised the patient to return to the emergency department with new or worsening symptoms or new concerns. The patient verbalized understanding and agreement with plan.      Waynetta Pean, PA-C 07/20/15 [place], MD 07/21/15 [phone]

## 2015-08-20 ENCOUNTER — Ambulatory Visit (INDEPENDENT_AMBULATORY_CARE_PROVIDER_SITE_OTHER): Payer: Self-pay | Admitting: Family Medicine

## 2015-08-20 ENCOUNTER — Encounter: Payer: Self-pay | Admitting: Family Medicine

## 2015-08-20 VITALS — BP 188/83 | HR 96 | Temp 98.2°F | Resp 18 | Ht 67.0 in | Wt 237.0 lb

## 2015-08-20 DIAGNOSIS — E138 Other specified diabetes mellitus with unspecified complications: Secondary | ICD-10-CM

## 2015-08-20 DIAGNOSIS — F419 Anxiety disorder, unspecified: Secondary | ICD-10-CM

## 2015-08-20 DIAGNOSIS — I10 Essential (primary) hypertension: Secondary | ICD-10-CM

## 2015-08-20 DIAGNOSIS — E785 Hyperlipidemia, unspecified: Secondary | ICD-10-CM

## 2015-08-20 DIAGNOSIS — F32A Depression, unspecified: Secondary | ICD-10-CM

## 2015-08-20 DIAGNOSIS — M5441 Lumbago with sciatica, right side: Secondary | ICD-10-CM

## 2015-08-20 DIAGNOSIS — F329 Major depressive disorder, single episode, unspecified: Secondary | ICD-10-CM

## 2015-08-20 LAB — COMPLETE METABOLIC PANEL WITH GFR
ALT: 14 U/L (ref 6–29)
AST: 19 U/L (ref 10–35)
Albumin: 4.2 g/dL (ref 3.6–5.1)
Alkaline Phosphatase: 59 U/L (ref 33–115)
BUN: 5 mg/dL — ABNORMAL LOW (ref 7–25)
CO2: 20 mmol/L (ref 20–31)
Calcium: 9.1 mg/dL (ref 8.6–10.2)
Chloride: 101 mmol/L (ref 98–110)
Creat: 0.65 mg/dL (ref 0.50–1.10)
GFR, Est African American: >89 mL/min (ref >=60)
GFR, Est Non African American: >89 mL/min (ref >=60)
Glucose, Bld: 302 mg/dL — ABNORMAL HIGH (ref 65–99)
Potassium: 4.2 mmol/L (ref 3.5–5.3)
Sodium: 132 mmol/L — ABNORMAL LOW (ref 135–146)
Total Bilirubin: 0.3 mg/dL (ref 0.2–1.2)
Total Protein: 7.1 g/dL (ref 6.1–8.1)

## 2015-08-20 LAB — LIPID PANEL
Cholesterol: 150 mg/dL (ref 125–200)
HDL: 27 mg/dL — ABNORMAL LOW (ref >=46)
LDL Cholesterol: 105 mg/dL (ref <130)
Total CHOL/HDL Ratio: 5.6 Ratio — ABNORMAL HIGH (ref <=5.0)
Triglycerides: 88 mg/dL (ref <150)
VLDL: 18 mg/dL (ref <30)

## 2015-08-20 MED ORDER — BUSPIRONE HCL 10 MG PO TABS: 10.0000 mg | ORAL_TABLET | Freq: Two times a day (BID) | ORAL | Status: DC

## 2015-08-20 MED ORDER — LOVASTATIN 20 MG PO TABS: 20.0000 mg | ORAL_TABLET | Freq: Every day | ORAL | Status: DC

## 2015-08-20 MED ORDER — LISINOPRIL 20 MG PO TABS: 20.0000 mg | ORAL_TABLET | Freq: Every day | ORAL | Status: DC

## 2015-08-20 MED ORDER — METFORMIN HCL 1000 MG PO TABS: 1000.0000 mg | ORAL_TABLET | Freq: Two times a day (BID) | ORAL | Status: DC

## 2015-08-20 MED ORDER — NAPROXEN 250 MG PO TABS: 250.0000 mg | ORAL_TABLET | Freq: Two times a day (BID) | ORAL | Status: DC

## 2015-08-20 MED ORDER — CITALOPRAM HYDROBROMIDE 40 MG PO TABS: 40.0000 mg | ORAL_TABLET | Freq: Every day | ORAL | Status: DC

## 2015-08-20 NOTE — Progress Notes (Signed)
Patient ID: Catherine Pope, female   DOB: December 13, 1965, 50 y.o.   MRN: [id number]   Catherine Pope, is a 50 y.o. female  [id number]  [id number]  DOB - June 21, 1965  CC:  Chief Complaint  Patient presents with  . Establish Care  . Numbness    numbness in back on left hand x 3 weeks   . Back Pain    in lower back running down right leg        HPI: Catherine Pope is a 50 y.o. female here to establish care. She has a history of diabetes, hyperlipidemia, hypertension, back pain. She has had sporatic health care over the last year or two. She does report taking her medications regularly but did not take her blood pressure medication today. She is on metformin 1000 bid, lovastatin 20, lisinopril 20. She is on Buspar for anxiety and celexa for depression. She reports she feels they are not helping sufficiently. She recently banged her left wrist on part of a car while getting out and reports some continue discomfort and numbness. She was seen in ED and was prescribed naproxen for this as well as chronic low back pain with sciaticia. She does report some relief with the naproxen.   No Known Allergies Past Medical History  Diagnosis Date  . Diabetes mellitus   . Hypertension   . Migraine   . Sinusitis    Current Outpatient Prescriptions on File Prior to Visit  Medication Sig Dispense Refill  . fish oil-omega-3 fatty acids 1000 MG capsule Take 2 capsules (2 g total) by mouth daily. 30 capsule 1  . Lancets (ONETOUCH ULTRASOFT) lancets Use as instructed 100 each 12  . fluticasone (FLONASE) 50 MCG/ACT nasal spray Place 2 sprays into both nostrils daily. (Patient not taking: Reported on 08/20/2015) 16 g 1  . glipiZIDE (GLUCOTROL XL) 5 MG 24 hr tablet Take 1 tablet (5 mg total) by mouth daily. (Patient not taking: Reported on 08/20/2015) 90 tablet 3  . methocarbamol (ROBAXIN) 500 MG tablet Take 1 tablet (500 mg total) by mouth 2 (two) times daily as needed for muscle spasms. (Patient not taking:  Reported on 08/20/2015) 20 tablet 0  . [DISCONTINUED] ipratropium (ATROVENT) 0.06 % nasal spray Place 2 sprays into both nostrils 4 (four) times daily. 15 mL 0  . [DISCONTINUED] rizatriptan (MAXALT-MLT) 10 MG disintegrating tablet Take 1 tablet (10 mg total) by mouth as needed for migraine. May repeat in 2 hours if needed 15 tablet 3   No current facility-administered medications on file prior to visit.   Family History  Problem Relation Age of Onset  . Heart Problems Father    Social History   Social History  . Marital Status: Single    Spouse Name: N/A  . Number of Children: 2  . Years of Education: 12 th   Occupational History  . unemployed    Social History Main Topics  . Smoking status: Current Every Day Smoker -- 0.50 packs/day    Types: Cigarettes  . Smokeless tobacco: Never Used  . Alcohol Use: No  . Drug Use: No  . Sexual Activity: Not on file   Other Topics Concern  . Not on file   Social History Narrative   Patient lives at home with her daughters and she is unemployed. Patient has two children.   Patient has a high school education.   Right handed.    Caffeine two cups daily.    Review of Systems: Constitutional: Negative for fever, chills, appetite  change, weight loss. Reports fatigue and some loss of appetite.  Skin: Negative for rashes or lesions of concern.Reports hands sometimes peel. HENT: Negative for ear pain, ear discharge.nose bleeds Eyes: Negative for pain, discharge, redness, itching and visual disturbance. Neck: Negative for pain, stiffness Respiratory: Negative for cough, shortness of breath,   Cardiovascular: Negative for chest pain, palpitations and leg swelling. Gastrointestinal: Negative for abdominal pain, nausea, vomiting, diarrhea, constipations Genitourinary: Negative for dysuria, urgency, frequency, hematuria,  Musculoskeletal: Negative joint pain, joint  swelling, and gait problem.Marland KitchenPositive for low back pain with radiation into leg.  Reports sometimes feels like leg will give way Neurological: Negative for dizziness, tremors, seizures, syncope,   light-headedness, numbness. Positive for migraine headaches. Hematological: Negative for easy bruising or bleeding Psychiatric/Behavioral: Positivefor depression, anxiety.   Objective:   Filed Vitals:   08/20/15 0940  BP: 188/83  Pulse: 96  Temp: 98.2 F (36.8 C)  Resp: 18    Physical Exam: Constitutional: Patient appears well-developed and well-nourished. No distress. HENT: Normocephalic, atraumatic, External right and left ear normal. Oropharynx is clear and moist.  Eyes: Conjunctivae and EOM are normal. PERRLA, no scleral icterus. Neck: Normal ROM. Neck supple. No lymphadenopathy, No thyromegaly. CVS: RRR, S1/S2 +, no murmurs, no gallops, no rubs Pulmonary: Effort and breath sounds normal, no stridor, rhonchi, wheezes, rales.  Abdominal: Soft. Normoactive BS,, no distension, tenderness, rebound or guarding.  Musculoskeletal: Normal range of motion. No edema and no tenderness.  Neuro: Alert.Normal muscle tone coordination. Non-focal Skin: Skin is warm and dry. No rash noted. Not diaphoretic. No erythema. No pallor. Psychiatric: Normal mood and affect. Behavior, judgment, thought content normal.  Lab Results  Component Value Date   WBC 12.5* 10/11/2014   HGB 13.5 10/11/2014   HCT 40.7 10/11/2014   MCV 88.1 10/11/2014   PLT 275 10/11/2014   Lab Results  Component Value Date   CREATININE 0.71 10/11/2014   BUN 9 10/11/2014   NA 135 10/11/2014   K 3.3* 10/11/2014   CL 102 10/11/2014   CO2 22 10/11/2014    Lab Results  Component Value Date   HGBA1C 10.3* 01/05/2014   Lipid Panel  No results found for: CHOL, TRIG, HDL, CHOLHDL, VLDL, LDLCALC     Assessment and plan:   1. Depression - citalopram (CELEXA) 40 MG tablet; Take 1 tablet (40 mg total) by mouth daily. Start with 1/2 tablet for one week  Dispense: 90 tablet; Refill: 3  2. Anxiety  -  busPIRone (BUSPAR) 10 MG tablet; Take 1 tablet (10 mg total) by mouth 2 (two) times daily. Start with 1/2 tablet twice a day and increase over 2 weeks  Dispense: 90 tablet; Refill: 3  3. Dyslipidemia  - Lipid panel - lovastatin (MEVACOR) 20 MG tablet; Take 1 tablet (20 mg total) by mouth at bedtime.  Dispense: 90 tablet; Refill: 3  4. Essential hypertension  - lisinopril (PRINIVIL,ZESTRIL) 20 MG tablet; Take 1 tablet (20 mg total) by mouth daily.  Dispense: 90 tablet; Refill: 3  5. Diabetes mellitus of other type with complication ([place])  - COMPLETE METABOLIC PANEL WITH GFR - Lipid panel - Hemoglobin A1c - Microalbumin, urine - metFORMIN (GLUCOPHAGE) 1000 MG tablet; Take 1 tablet (1,000 mg total) by mouth 2 (two) times daily with a meal.  Dispense: 180 tablet; Refill: 3 -Foot exam  6. Right-sided low back pain with right-sided sciatica  - naproxen (NAPROSYN) 250 MG tablet; Take 1 tablet (250 mg total) by mouth 2 (two) times daily with a  meal.  Dispense: 30 tablet; Refill: 2   Return in about 4 weeks (around 09/17/2015).  The patient was given clear instructions to go to ER or return to medical center if symptoms don't improve, worsen or new problems develop. The patient verbalized understanding.    Micheline Chapman FNP  08/20/2015, 11:51 AM

## 2015-08-20 NOTE — Patient Instructions (Signed)
When you go to [facility] to pick up medications, ask about orange card When you get you bill, call [facility] to see about [facility]. Careful of carbs, fats and cholesterol in diet Exercise 150 minutes a week if possible.  We will let you know if you need a change in your diabetes medication Be sure to take your blood pressure medications daily. Come in for nurse visit to check blood pressure in one month

## 2015-08-21 LAB — HEMOGLOBIN A1C
Hgb A1c MFr Bld: 9.2 % — ABNORMAL HIGH (ref <5.7)
Mean Plasma Glucose: 217 mg/dL

## 2015-08-21 LAB — MICROALBUMIN, URINE: Microalb, Ur: 4.2 mg/dL

## 2015-09-17 ENCOUNTER — Ambulatory Visit: Payer: Self-pay

## 2015-09-17 DIAGNOSIS — Z0271 Encounter for disability determination: Secondary | ICD-10-CM

## 2015-09-21 ENCOUNTER — Ambulatory Visit (INDEPENDENT_AMBULATORY_CARE_PROVIDER_SITE_OTHER): Payer: Self-pay | Admitting: *Deleted

## 2015-09-21 VITALS — BP 153/79 | HR 82 | Temp 98.2°F | Resp 18

## 2015-09-21 DIAGNOSIS — Z0130 Encounter for examination of blood pressure without abnormal findings: Secondary | ICD-10-CM

## 2015-09-21 NOTE — Progress Notes (Signed)
Patient is here for BP check  Patient denies any pain at this time.  Patient took BP medication 10 minutes

## 2015-09-21 NOTE — Patient Instructions (Signed)
Patient is aware of following up with her PCP.

## 2015-11-30 ENCOUNTER — Ambulatory Visit (INDEPENDENT_AMBULATORY_CARE_PROVIDER_SITE_OTHER): Payer: Self-pay | Admitting: Family Medicine

## 2015-11-30 VITALS — BP 169/79 | HR 82 | Temp 98.3°F | Resp 18 | Ht 67.0 in | Wt 241.0 lb

## 2015-11-30 DIAGNOSIS — E138 Other specified diabetes mellitus with unspecified complications: Secondary | ICD-10-CM

## 2015-11-30 DIAGNOSIS — M5441 Lumbago with sciatica, right side: Secondary | ICD-10-CM

## 2015-11-30 DIAGNOSIS — I10 Essential (primary) hypertension: Secondary | ICD-10-CM

## 2015-11-30 LAB — CBC WITH DIFFERENTIAL/PLATELET
Basophils Absolute: 0 cells/uL (ref 0–200)
Basophils Relative: 0 %
Eosinophils Absolute: 208 cells/uL (ref 15–500)
Eosinophils Relative: 2 %
HCT: 41.8 % (ref 35.0–45.0)
Hemoglobin: 13.7 g/dL (ref 11.7–15.5)
Lymphocytes Relative: 32 %
Lymphs Abs: 3328 cells/uL (ref 850–3900)
MCH: 29.3 pg (ref 27.0–33.0)
MCHC: 32.8 g/dL (ref 32.0–36.0)
MCV: 89.3 fL (ref 80.0–100.0)
MPV: 11.1 fL (ref 7.5–12.5)
Monocytes Absolute: 624 cells/uL (ref 200–950)
Monocytes Relative: 6 %
Neutro Abs: 6240 cells/uL (ref 1500–7800)
Neutrophils Relative %: 60 %
Platelets: 290 10*3/uL (ref 140–400)
RBC: 4.68 MIL/uL (ref 3.80–5.10)
RDW: 14.5 % (ref 11.0–15.0)
WBC: 10.4 10*3/uL (ref 3.8–10.8)

## 2015-11-30 LAB — GLUCOSE, CAPILLARY: Glucose-Capillary: 289 mg/dL — ABNORMAL HIGH (ref 65–99)

## 2015-11-30 LAB — POCT GLYCOSYLATED HEMOGLOBIN (HGB A1C): Hemoglobin A1C: 9

## 2015-11-30 MED ORDER — MELOXICAM 15 MG PO TABS: 15.0000 mg | ORAL_TABLET | Freq: Every day | ORAL | 2 refills | Status: DC

## 2015-11-30 MED FILL — MELOXICAM 15 MG TABLET: 15 | 30 days supply | Qty: 30 | Fill #0

## 2015-11-30 NOTE — Progress Notes (Signed)
Catherine Pope, is a 50 y.o. female  [id number]  [id number]  DOB - Oct 11, 1965  CC:  Chief Complaint  Patient presents with  . Follow-up       HPI: Catherine Pope is a 50 y.o. female here for follow-up diabetes and hypertension. She also brings up her chronic back and leg pain and her migraine headaches. She says the naproxen I prescribed did not help her back pain.  She has not tried anything OTC for her migraines.  She reports she ran out of her medications 2 weeks ago and did not know how to get to [facility] to pick them up. She reports her blood sugars have been running around 230. She reports following a diabetic diet and walking daily. She reports having 4 headaches last week.   No Known Allergies Past Medical History:  Diagnosis Date  . Diabetes mellitus   . Hypertension   . Migraine   . Sinusitis    Current Outpatient Prescriptions on File Prior to Visit  Medication Sig Dispense Refill  . busPIRone (BUSPAR) 10 MG tablet Take 1 tablet (10 mg total) by mouth 2 (two) times daily. Start with 1/2 tablet twice a day and increase over 2 weeks 90 tablet 3  . citalopram (CELEXA) 40 MG tablet Take 1 tablet (40 mg total) by mouth daily. Start with 1/2 tablet for one week 90 tablet 3  . fish oil-omega-3 fatty acids 1000 MG capsule Take 2 capsules (2 g total) by mouth daily. 30 capsule 1  . Lancets (ONETOUCH ULTRASOFT) lancets Use as instructed 100 each 12  . lisinopril (PRINIVIL,ZESTRIL) 20 MG tablet Take 1 tablet (20 mg total) by mouth daily. 90 tablet 3  . lovastatin (MEVACOR) 20 MG tablet Take 1 tablet (20 mg total) by mouth at bedtime. 90 tablet 3  . metFORMIN (GLUCOPHAGE) 1000 MG tablet Take 1 tablet (1,000 mg total) by mouth 2 (two) times daily with a meal. 180 tablet 3  . fluticasone (FLONASE) 50 MCG/ACT nasal spray Place 2 sprays into both nostrils daily. (Patient not taking: Reported on 08/20/2015) 16 g 1  . glipiZIDE (GLUCOTROL XL) 5 MG 24 hr tablet Take 1 tablet (5 mg  total) by mouth daily. (Patient not taking: Reported on 08/20/2015) 90 tablet 3  . methocarbamol (ROBAXIN) 500 MG tablet Take 1 tablet (500 mg total) by mouth 2 (two) times daily as needed for muscle spasms. (Patient not taking: Reported on 08/20/2015) 20 tablet 0  . [DISCONTINUED] ipratropium (ATROVENT) 0.06 % nasal spray Place 2 sprays into both nostrils 4 (four) times daily. 15 mL 0  . [DISCONTINUED] rizatriptan (MAXALT-MLT) 10 MG disintegrating tablet Take 1 tablet (10 mg total) by mouth as needed for migraine. May repeat in 2 hours if needed 15 tablet 3   No current facility-administered medications on file prior to visit.    Family History  Problem Relation Age of Onset  . Heart Problems Father    Social History   Social History  . Marital status: Single    Spouse name: N/A  . Number of children: 2  . Years of education: 48 th   Occupational History  . unemployed    Social History Main Topics  . Smoking status: Current Every Day Smoker    Packs/day: 0.50    Types: Cigarettes  . Smokeless tobacco: Never Used  . Alcohol use No  . Drug use: No  . Sexual activity: Not on file   Other Topics Concern  . Not on file  Social History Narrative   Patient lives at home with her daughters and she is unemployed. Patient has two children.   Patient has a high school education.   Right handed.    Caffeine two cups daily.    Review of Systems: Constitutional: Negative for fever, chills, appetite change, weight loss,  Fatigue. Skin: Negative for rashes or lesions of concern. HENT: Negative for ear pain, ear discharge.nose bleeds Eyes: Negative for pain, discharge, redness, itching and visual disturbance. Neck: Negative for pain, stiffness Respiratory: Negative for cough, shortness of breath,   Cardiovascular: Negative for chest pain, palpitations and leg swelling. Gastrointestinal: Negative for abdominal pain, vomiting, diarrhea, constipations. Positive for occ  nausea Genitourinary: Negative for dysuria, urgency, frequency, hematuria,  Musculoskeletal: Negative for back pain, joint pain, joint  swelling, and gait problem.Negative for weakness. Neurological: Negative for dizziness, tremors, seizures, syncope,   light-headedness, numbness. Positive for headaches Hematological: Negative for easy bruising or bleeding Psychiatric/Behavioral: Negative for anxiety, decreased concentration, confusion. Positive for treated depression   Objective:   Vitals:   11/30/15 0838  BP: (!) 169/79  Pulse: 82  Resp: 18  Temp: 98.3 F (36.8 C)    Physical Exam: Constitutional: Patient appears well-developed and well-nourished. No distress.Obese HENT: Normocephalic, atraumatic, External right and left ear normal. Oropharynx is clear and moist.  Eyes: Conjunctivae and EOM are normal. PERRLA, no scleral icterus. Neck: Normal ROM. Neck supple. No lymphadenopathy, No thyromegaly. CVS: RRR, S1/S2 +, no murmurs, no gallops, no rubs Pulmonary: Effort and breath sounds normal, no stridor, rhonchi, wheezes, rales.  Abdominal: Soft. Normoactive BS,, no distension, tenderness, rebound or guarding.  Musculoskeletal: Normal range of motion. No edema and no tenderness.  Neuro: Alert.Normal muscle tone coordination. Non-focal Skin: Skin is warm and dry. No rash noted. Not diaphoretic. No erythema. No pallor. Psychiatric: Normal mood and affect. Behavior, judgment, thought content normal.  Lab Results  Component Value Date   WBC 12.5 (H) 10/11/2014   HGB 13.5 10/11/2014   HCT 40.7 10/11/2014   MCV 88.1 10/11/2014   PLT 275 10/11/2014   Lab Results  Component Value Date   CREATININE 0.65 08/20/2015   BUN 5 (L) 08/20/2015   NA 132 (L) 08/20/2015   K 4.2 08/20/2015   CL 101 08/20/2015   CO2 20 08/20/2015    Lab Results  Component Value Date   HGBA1C 9.0 11/30/2015   Lipid Panel     Component Value Date/Time   CHOL 150 08/20/2015 1037   TRIG 88 08/20/2015  1037   HDL 27 (L) 08/20/2015 1037   CHOLHDL 5.6 (H) 08/20/2015 1037   VLDL 18 08/20/2015 1037   LDLCALC 105 08/20/2015 1037       Assessment and plan:   1. Diabetes mellitus of other type with complication ([place])  - POCT glycosylated hemoglobin (Hb A1C) - CBC with Differential - Microalbumin, urine - HIV antibody (with reflex) - Ambulatory referral to Ophthalmology  2, Migraine headaches -recommended trying Excedrin OTC.  3. Back and leg pain, chronic -Meloxicam 15 mg. #30, one po prn.    Return in about 3 months (around 03/01/2016) for HTN, Diabetes, A1C.  The patient was given clear instructions to go to ER or return to medical center if symptoms don't improve, worsen or new problems develop. The patient verbalized understanding.    Micheline Chapman FNP  11/30/2015, 9:28 AM

## 2015-11-30 NOTE — Patient Instructions (Addendum)
Pick up medications at [facility] and get restarted ASAP Am prescribing a different anti-inflammatory for back and leg pain Try Excedrin for headache

## 2015-11-30 NOTE — Progress Notes (Signed)
Patient is here for 2 month FU  Patient complains of intermittent migraines and right leg lower back sciatic pain   Patient has not taken medication in 2 weeks. Patient ate a sausage mc muffin combo with sweet tea this morning.

## 2015-12-01 LAB — MICROALBUMIN, URINE: Microalb, Ur: 8.1 mg/dL

## 2015-12-01 LAB — HIV ANTIBODY (ROUTINE TESTING W REFLEX): HIV 1&2 Ab, 4th Generation: NONREACTIVE

## 2015-12-02 MED FILL — ?METFORMIN HCL 1,000 MG TAB: 1000 | 30 days supply | Qty: 60 | Fill #0

## 2015-12-02 MED FILL — LOVASTATIN 20 MG TABLET: 20 | 30 days supply | Qty: 30 | Fill #0

## 2015-12-02 MED FILL — ?LISINOPRIL 20 MG TABLET: 20 | 30 days supply | Qty: 30 | Fill #0

## 2015-12-02 MED FILL — ?CITALOPRAM HBR 40 MG TABLE: 40 | 33 days supply | Qty: 30 | Fill #0

## 2015-12-02 MED FILL — busPIRone HCL 10 MG TABS: 10 | 45 days supply | Qty: 90 | Fill #0

## 2015-12-14 ENCOUNTER — Ambulatory Visit (INDEPENDENT_AMBULATORY_CARE_PROVIDER_SITE_OTHER): Payer: Self-pay

## 2015-12-14 DIAGNOSIS — Z23 Encounter for immunization: Secondary | ICD-10-CM

## 2016-01-04 MED FILL — ?CITALOPRAM HBR 40 MG TABLE: 40 | 33 days supply | Qty: 30 | Fill #1

## 2016-01-04 MED FILL — LOVASTATIN 20 MG TABLET: 20 | 30 days supply | Qty: 30 | Fill #1

## 2016-01-04 MED FILL — ?LISINOPRIL 20 MG TABLET: 20 | 30 days supply | Qty: 30 | Fill #1

## 2016-01-04 MED FILL — MELOXICAM 15 MG TABLET: 15 | 30 days supply | Qty: 30 | Fill #1

## 2016-01-27 ENCOUNTER — Ambulatory Visit: Payer: Self-pay | Attending: Internal Medicine

## 2016-02-08 MED FILL — LISINOPRIL 20 MG TABLET: 20 | 30 days supply | Qty: 30 | Fill #2

## 2016-02-08 MED FILL — ?CITALOPRAM HBR 40 MG TABLE: 40 | 33 days supply | Qty: 30 | Fill #2

## 2016-02-08 MED FILL — MELOXICAM 15 MG TABLET: 15 | 30 days supply | Qty: 30 | Fill #2

## 2016-02-08 MED FILL — LOVASTATIN 20 MG TABLET: 20 | 30 days supply | Qty: 30 | Fill #2

## 2016-02-11 MED FILL — busPIRone HCL 10 MG TABS: 10 | 45 days supply | Qty: 90 | Fill #1

## 2016-02-25 ENCOUNTER — Encounter (HOSPITAL_COMMUNITY): Payer: Self-pay

## 2016-02-25 ENCOUNTER — Ambulatory Visit (HOSPITAL_COMMUNITY)
Admission: EM | Admit: 2016-02-25 | Discharge: 2016-02-25 | Disposition: A | Discharge status: Home / Self Care | Payer: Self-pay | Attending: Emergency Medicine | Admitting: Emergency Medicine

## 2016-02-25 DIAGNOSIS — J042 Acute laryngotracheitis: Secondary | ICD-10-CM

## 2016-02-25 DIAGNOSIS — M7711 Lateral epicondylitis, right elbow: Secondary | ICD-10-CM

## 2016-02-25 MED ORDER — DEXAMETHASONE SODIUM PHOSPHATE 10 MG/ML IJ SOLN
10.0000 mg | Freq: Once | INTRAMUSCULAR | Status: AC
  Administered 2016-02-25: 10 mg via INTRAMUSCULAR

## 2016-02-25 MED ORDER — HYDROCOD POLST-CPM POLST ER 10-8 MG/5ML PO SUER: 5.0000 mL | Freq: Two times a day (BID) | ORAL | 0 refills | Status: DC | PRN

## 2016-02-25 MED ORDER — DEXAMETHASONE SODIUM PHOSPHATE 10 MG/ML IJ SOLN
INTRAMUSCULAR | Status: AC
  Filled 2016-02-25: qty 1

## 2016-02-25 MED ORDER — MELOXICAM 15 MG PO TABS: 15.0000 mg | ORAL_TABLET | Freq: Every day | ORAL | 0 refills | Status: DC

## 2016-02-25 NOTE — Discharge Instructions (Signed)
You have the virus that is going around. Continue steroid shot today to help with the voice and cough. Drink plenty of fluids. Use the Tussionex every 12 hours as needed for cough. Do not drive while taking this medicine.  You also have tennis elbow. There is a brace for this that you can get at [facility]. Rest the arm as much as you can for the next week. Take meloxicam daily for the next week, then as needed.  Follow-up as needed.

## 2016-02-25 NOTE — ED Provider Notes (Signed)
[place]    CSN: [id number] Arrival date & time: 02/25/16  1000     History   Chief Complaint Chief Complaint  Patient presents with  . Laryngitis  . Arm Pain    HPI Catherine Pope is a 50 y.o. female.   HPI  She is a 50 year old woman here for evaluation of loss of voice and right arm pain. She states she has had a dry cough for 2 weeks now. It is associated with chest discomfort with coughing, nasal congestion, and bilateral ear discomfort. In the last week, she has lost her voice as well. She has tried resting her voice and taking Mucinex and allergy medicine without much improvement. No fevers. She does report some intermittent mild wheezing as well as shortness of breath with the cough.  She also reports a four-day history of right lateral elbow and forearm pain. It is worse when she tries to do stuff with her arm. She reports feeling like her hand and arm is a little weaker than normal. No injury or trauma. 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  Past Medical History:  Diagnosis Date  . Diabetes mellitus   . Hypertension   . Migraine   . Sinusitis     Patient Active Problem List   Diagnosis Date Noted  . Type II or unspecified type diabetes mellitus with unspecified complication, uncontrolled 11/26/2013  . Morbid obesity ([place]) 11/26/2013  . Migraine without aura 11/26/2013  . Other and unspecified hyperlipidemia 11/26/2013    Past Surgical History:  Procedure Laterality Date  . ABDOMINAL HYSTERECTOMY    . ANTERIOR CRUCIATE LIGAMENT REPAIR      OB History    No data available       Home Medications    Prior to Admission medications   Medication Sig Start Date End Date Taking? Authorizing Provider  busPIRone (BUSPAR) 10 MG tablet Take 1 tablet (10 mg total) by mouth 2 (two) times daily. Start with 1/2 tablet twice a day and increase over 2 weeks 08/20/15  Yes Micheline Chapman, NP  citalopram (CELEXA) 40 MG tablet Take 1 tablet (40 mg total) by  mouth daily. Start with 1/2 tablet for one week 08/20/15  Yes Micheline Chapman, NP  fish oil-omega-3 fatty acids 1000 MG capsule Take 2 capsules (2 g total) by mouth daily. 06/22/14  Yes Ernestina Patches, MD  glipiZIDE (GLUCOTROL XL) 5 MG 24 hr tablet Take 1 tablet (5 mg total) by mouth daily. 10/13/14 02/25/16 Yes Gay Filler Copland, MD  Lancets (ONETOUCH ULTRASOFT) lancets Use as instructed 06/22/14  Yes Ernestina Patches, MD  lisinopril (PRINIVIL,ZESTRIL) 20 MG tablet Take 1 tablet (20 mg total) by mouth daily. 08/20/15  Yes Micheline Chapman, NP  lovastatin (MEVACOR) 20 MG tablet Take 1 tablet (20 mg total) by mouth at bedtime. 08/20/15  Yes Micheline Chapman, NP  metFORMIN (GLUCOPHAGE) 1000 MG tablet Take 1 tablet (1,000 mg total) by mouth 2 (two) times daily with a meal. 08/20/15  Yes Micheline Chapman, NP  chlorpheniramine-HYDROcodone (TUSSIONEX PENNKINETIC ER) 10-8 MG/5ML SUER Take 5 mLs by mouth every 12 (twelve) hours as needed for cough. 02/25/16   Melony Overly, MD  fluticasone (FLONASE) 50 MCG/ACT nasal spray Place 2 sprays into both nostrils daily. 06/22/14   Ernestina Patches, MD  meloxicam (MOBIC) 15 MG tablet Take 1 tablet (15 mg total) by mouth daily. 02/25/16   Melony Overly, MD    Family History Family History  Problem Relation  Age of Onset  . Heart Problems Father     Social History Social History  Substance Use Topics  . Smoking status: Current Every Day Smoker    Packs/day: 0.50    Types: Cigarettes  . Smokeless tobacco: Never Used  . Alcohol use No     Allergies   Patient has no known allergies.   Review of Systems Review of Systems As in history of present illness  Physical Exam Triage Vital Signs ED Triage Vitals  Enc Vitals Group     BP 02/25/16 1017 187/83     Pulse Rate 02/25/16 1017 99     Resp 02/25/16 1017 16     Temp 02/25/16 1017 98.5 F (36.9 C)     Temp Source 02/25/16 1017 Oral     SpO2 02/25/16 1017 99 %     Weight 02/25/16 1018 238 lb (108 kg)      Height 02/25/16 1018 5\' 6"  (1.676 m)     Head Circumference --      Peak Flow --      Pain Score 02/25/16 1020 7     Pain Loc --      Pain Edu? --      Excl. in [place]? --    No data found.   Updated Vital Signs BP 187/83 (BP Location: Right Arm) Comment: Did not take bp medication this morning  Pulse 99   Temp 98.5 F (36.9 C) (Oral)   Resp 16   Ht 5\' 6"  (1.676 m)   Wt 238 lb (108 kg)   SpO2 99%   BMI 38.41 kg/m   Visual Acuity Right Eye Distance:   Left Eye Distance:   Bilateral Distance:    Right Eye Near:   Left Eye Near:    Bilateral Near:     Physical Exam  Constitutional: She is oriented to person, place, and time. She appears well-developed and well-nourished. No distress.  HENT:  Mouth/Throat: No oropharyngeal exudate.  Nasal mucosa is edematous with discharge present. TMs normal bilaterally. Oropharynx with minimal erythema.  Neck: Neck supple.  Cardiovascular: Normal rate, regular rhythm and normal heart sounds.   No murmur heard. Pulmonary/Chest: Effort normal and breath sounds normal. No respiratory distress. She has no wheezes. She has no rales.  Musculoskeletal:  Right arm: 5 out of 5 strength. 2+ radial pulse. She is quite tender over the lateral epicondyles of the elbow. No forearm tenderness.  Lymphadenopathy:    She has no cervical adenopathy.  Neurological: She is alert and oriented to person, place, and time.     UC Treatments / Results  Labs (all labs ordered are listed, but only abnormal results are displayed) Labs Reviewed - No data to display  EKG  EKG Interpretation None       Radiology No results found.  Procedures Procedures (including critical care time)  Medications Ordered in UC Medications  dexamethasone (DECADRON) injection 10 mg (10 mg Intramuscular Given 02/25/16 1037)     Initial Impression / Assessment and Plan / UC Course  I have reviewed the triage vital signs and the nursing notes.  Pertinent labs &  imaging results that were available during my care of the patient were reviewed by me and considered in my medical decision making (see chart for details).  Clinical Course     Decadron given here for laryngitis. Tussionex as needed for cough. Otherwise, continue symptomatic treatment. Meloxicam daily for tennis elbow. Discussed rest and over-the-counter brace as well. Follow-up as  needed.  Final Clinical Impressions(s) / UC Diagnoses   Final diagnoses:  Right tennis elbow  Laryngotracheitis    New Prescriptions Discharge Medication List as of 02/25/2016 10:30 AM    START taking these medications   Details  chlorpheniramine-HYDROcodone (TUSSIONEX PENNKINETIC ER) 10-8 MG/5ML SUER Take 5 mLs by mouth every 12 (twelve) hours as needed for cough., Starting Fri 02/25/2016, Print         Melony Overly, MD 02/25/16 1047

## 2016-02-25 NOTE — ED Triage Notes (Signed)
Pt said she had a cough for about 2 weeks and now has laryngitis, cough (dry) and hurts deep down. Bilateral ear pain with worse pain in her left ear. Chest congestion and no fever. Has taken mucinex and allergy pill without relief. Also having right forearm pain for about 4 days no injury and just woke up and the arm was hurting. Took tylenol without relief.

## 2016-03-07 ENCOUNTER — Ambulatory Visit: Payer: Self-pay | Admitting: Family Medicine

## 2016-03-17 MED FILL — LISINOPRIL 20 MG TABLET: 20 | 30 days supply | Qty: 30 | Fill #3

## 2016-03-17 MED FILL — ?CITALOPRAM HBR 40 MG TABLE: 40 | 33 days supply | Qty: 30 | Fill #3

## 2016-03-17 MED FILL — LOVASTATIN 20 MG TABLET: 20 | 30 days supply | Qty: 30 | Fill #3

## 2016-03-23 ENCOUNTER — Ambulatory Visit (INDEPENDENT_AMBULATORY_CARE_PROVIDER_SITE_OTHER): Payer: Self-pay | Admitting: Family Medicine

## 2016-03-23 ENCOUNTER — Encounter: Payer: Self-pay | Admitting: Family Medicine

## 2016-03-23 VITALS — BP 168/82 | HR 107 | Temp 98.2°F | Resp 14 | Ht 67.0 in | Wt 228.0 lb

## 2016-03-23 DIAGNOSIS — J040 Acute laryngitis: Secondary | ICD-10-CM

## 2016-03-23 DIAGNOSIS — E119 Type 2 diabetes mellitus without complications: Secondary | ICD-10-CM

## 2016-03-23 DIAGNOSIS — I10 Essential (primary) hypertension: Secondary | ICD-10-CM | POA: Insufficient documentation

## 2016-03-23 DIAGNOSIS — G629 Polyneuropathy, unspecified: Secondary | ICD-10-CM | POA: Insufficient documentation

## 2016-03-23 DIAGNOSIS — F17200 Nicotine dependence, unspecified, uncomplicated: Secondary | ICD-10-CM | POA: Insufficient documentation

## 2016-03-23 LAB — POCT URINALYSIS DIP (DEVICE)
Bilirubin Urine: NEGATIVE
Glucose, UA: 500 mg/dL — AB
Leukocytes, UA: NEGATIVE
Nitrite: NEGATIVE
Protein, ur: 100 mg/dL — AB
Specific Gravity, Urine: 1.025 (ref 1.005–1.030)
Urobilinogen, UA: 1 mg/dL (ref 0.0–1.0)
pH: 5.5 (ref 5.0–8.0)

## 2016-03-23 LAB — CBC WITH DIFFERENTIAL/PLATELET
Basophils Absolute: 0 cells/uL (ref 0–200)
Basophils Relative: 0 %
Eosinophils Absolute: 153 cells/uL (ref 15–500)
Eosinophils Relative: 1 %
HCT: 42.9 % (ref 35.0–45.0)
Hemoglobin: 14.5 g/dL (ref 11.7–15.5)
Lymphocytes Relative: 35 %
Lymphs Abs: 5355 cells/uL — ABNORMAL HIGH (ref 850–3900)
MCH: 29.8 pg (ref 27.0–33.0)
MCHC: 33.8 g/dL (ref 32.0–36.0)
MCV: 88.1 fL (ref 80.0–100.0)
MPV: 11.9 fL (ref 7.5–12.5)
Monocytes Absolute: 918 cells/uL (ref 200–950)
Monocytes Relative: 6 %
Neutro Abs: 8874 cells/uL — ABNORMAL HIGH (ref 1500–7800)
Neutrophils Relative %: 58 %
Platelets: 311 10*3/uL (ref 140–400)
RBC: 4.87 MIL/uL (ref 3.80–5.10)
RDW: 13.7 % (ref 11.0–15.0)
WBC: 15.3 10*3/uL — ABNORMAL HIGH (ref 3.8–10.8)

## 2016-03-23 LAB — LIPID PANEL
Cholesterol: 213 mg/dL — ABNORMAL HIGH (ref <200)
HDL: 27 mg/dL — ABNORMAL LOW (ref >50)
LDL Cholesterol: 157 mg/dL — ABNORMAL HIGH (ref <100)
Total CHOL/HDL Ratio: 7.9 Ratio — ABNORMAL HIGH (ref <5.0)
Triglycerides: 145 mg/dL (ref <150)
VLDL: 29 mg/dL (ref <30)

## 2016-03-23 LAB — COMPLETE METABOLIC PANEL WITH GFR
ALT: 24 U/L (ref 6–29)
AST: 23 U/L (ref 10–35)
Albumin: 4.1 g/dL (ref 3.6–5.1)
Alkaline Phosphatase: 55 U/L (ref 33–115)
BUN: 6 mg/dL — ABNORMAL LOW (ref 7–25)
CO2: 21 mmol/L (ref 20–31)
Calcium: 9.2 mg/dL (ref 8.6–10.2)
Chloride: 102 mmol/L (ref 98–110)
Creat: 0.65 mg/dL (ref 0.50–1.10)
GFR, Est African American: >89 mL/min (ref >=60)
GFR, Est Non African American: >89 mL/min (ref >=60)
Glucose, Bld: 208 mg/dL — ABNORMAL HIGH (ref 65–99)
Potassium: 3.9 mmol/L (ref 3.5–5.3)
Sodium: 136 mmol/L (ref 135–146)
Total Bilirubin: 0.3 mg/dL (ref 0.2–1.2)
Total Protein: 7.4 g/dL (ref 6.1–8.1)

## 2016-03-23 LAB — TSH: TSH: 1.07 mIU/L

## 2016-03-23 LAB — POCT GLYCOSYLATED HEMOGLOBIN (HGB A1C): Hemoglobin A1C: 12.5

## 2016-03-23 MED ORDER — INSULIN GLARGINE 100 UNIT/ML ~~LOC~~ SOLN: 20.0000 [IU] | Freq: Every day | SUBCUTANEOUS | 11 refills | Status: DC

## 2016-03-23 MED ORDER — CLONIDINE HCL 0.1 MG PO TABS
0.1000 mg | ORAL_TABLET | Freq: Once | ORAL | Status: AC
  Administered 2016-03-23: 0.1 mg via ORAL

## 2016-03-23 MED ORDER — GLUCOSE BLOOD VI STRP: ORAL_STRIP | 12 refills | Status: DC

## 2016-03-23 MED ORDER — TRUE METRIX AIR GLUCOSE METER W/DEVICE KIT: 1.0000 | PACK | Freq: Three times a day (TID) | 0 refills | Status: DC

## 2016-03-23 MED ORDER — GABAPENTIN 300 MG PO CAPS: 300.0000 mg | ORAL_CAPSULE | Freq: Every day | ORAL | 1 refills | Status: DC

## 2016-03-23 MED ORDER — "INSULIN SYRINGE 30G X 1/2"" 1 ML MISC": 1.0000 | Freq: Every day | 2 refills | Status: DC

## 2016-03-23 MED ORDER — HYDROCHLOROTHIAZIDE 12.5 MG PO TABS: 12.5000 mg | ORAL_TABLET | Freq: Every day | ORAL | 3 refills | Status: DC

## 2016-03-23 MED FILL — TRUE METRIX TEST STRIP: 30 days supply | Qty: 100 | Fill #0

## 2016-03-23 MED FILL — ?HYDROCHLOROTHIAZIDE 12.5MG: 12.5 | 30 days supply | Qty: 30 | Fill #0

## 2016-03-23 MED FILL — TRUEPLUS SYR 0.3ML 30GX5/16: 30G X 5/16" | 30 days supply | Qty: 100 | Fill #0

## 2016-03-23 MED FILL — !LANTUS 100 UNITS/ML VIAL: 100 | 50 days supply | Qty: 10 | Fill #0

## 2016-03-23 MED FILL — TRUE METRIX BLOOD GLUCOSE M: W/DEVICE | 1 days supply | Qty: 1 | Fill #0

## 2016-03-23 MED FILL — GABAPENTIN 300 MG CAPSULE: 300 | 30 days supply | Qty: 30 | Fill #0

## 2016-03-23 NOTE — Progress Notes (Signed)
Subjective:    Patient ID: Catherine Pope, female    DOB: [birth year], 50 y.o.   MRN: [id number]  Hypertension  This is a chronic problem. The current episode started more than 1 year ago (Patient has been out of medications for 3 days). The problem is unchanged. The problem is uncontrolled. Associated symptoms include blurred vision and malaise/fatigue. Pertinent negatives include no anxiety, chest pain, headaches, orthopnea, palpitations, peripheral edema, shortness of breath or sweats. There are no associated agents to hypertension. Risk factors for coronary artery disease include diabetes mellitus, dyslipidemia, obesity, sedentary lifestyle and smoking/tobacco exposure. Past treatments include diuretics and calcium channel blockers. There is no history of angina, kidney disease, CAD/MI, CVA, heart failure, left ventricular hypertrophy, PVD, renovascular disease, retinopathy or a thyroid problem. There is no history of hyperaldosteronism, hypercortisolism, hyperparathyroidism, a hypertension causing med, pheochromocytoma or sleep apnea.  Diabetes  She presents for her follow-up diabetic visit. She has type 2 diabetes mellitus. Her disease course has been worsening. Pertinent negatives for hypoglycemia include no headaches or sweats. Associated symptoms include blurred vision, fatigue and foot paresthesias. Pertinent negatives for diabetes include no chest pain, no foot ulcerations, no polydipsia, no polyphagia, no polyuria, no visual change, no weakness and no weight loss. Symptoms are worsening. Pertinent negatives for diabetic complications include no CVA, PVD or retinopathy. Risk factors for coronary artery disease include dyslipidemia, diabetes mellitus, obesity and hypertension. She is compliant with treatment some of the time. When asked about meal planning, she reported none. She has not had a previous visit with a dietitian. She rarely participates in exercise. An ACE inhibitor/angiotensin II  receptor blocker is being taken. She does not see a podiatrist.Eye exam is not current.   Past Medical History:  Diagnosis Date  . Diabetes mellitus   . Hypertension   . Migraine   . Sinusitis    Social History   Social History  . Marital status: Single    Spouse name: N/A  . Number of children: 2  . Years of education: 71 th   Occupational History  . unemployed    Social History Main Topics  . Smoking status: Current Every Day Smoker    Packs/day: 0.50    Types: Cigarettes  . Smokeless tobacco: Never Used  . Alcohol use No  . Drug use: No  . Sexual activity: Not on file   Other Topics Concern  . Not on file   Social History Narrative   Patient lives at home with her daughters and she is unemployed. Patient has two children.   Patient has a high school education.   Right handed.    Caffeine two cups daily.  No Known Allergies    Review of Systems  Constitutional: Positive for fatigue and malaise/fatigue. Negative for weight loss.  HENT: Negative.   Eyes: Positive for blurred vision.  Respiratory: Negative.  Negative for shortness of breath.   Cardiovascular: Negative.  Negative for chest pain, palpitations and orthopnea.  Gastrointestinal: Negative.   Endocrine: Negative for polydipsia, polyphagia and polyuria.  Genitourinary: Negative.   Musculoskeletal: Positive for myalgias.       Right hip pain with sciatica  Neurological: Positive for numbness. Negative for weakness and headaches.  Hematological: Negative.        Objective:   Physical Exam  Constitutional: She is oriented to person, place, and time. She appears well-developed and well-nourished.  HENT:  Head: Normocephalic and atraumatic.  Right Ear: External ear normal.  Left Ear: External ear normal.  Nose: Nose normal.  Eyes: Conjunctivae are normal. Pupils are equal, round, and reactive to light.  Neck: Normal range of motion. Neck supple.  Cardiovascular: Normal rate, regular rhythm, normal  heart sounds and intact distal pulses.   Pulmonary/Chest: Effort normal and breath sounds normal.  Abdominal: Soft. Bowel sounds are normal.  Neurological: She is alert and oriented to person, place, and time. She has normal reflexes.  Monofilament test negative  Skin: Skin is warm and dry.  Psychiatric: She has a normal mood and affect. Her behavior is normal. Judgment and thought content normal.      BP (!) 182/88 (BP Location: Left Arm, Patient Position: Sitting, Cuff Size: Large)   Pulse (!) 107   Temp 98.2 F (36.8 C) (Oral)   Resp 14   Ht _0  (1.702 m)   Wt 228 lb (103.4 kg)   SpO2 100%   BMI 35.71 kg/m  Assessment & Plan:  1. Essential hypertension Blood pressure was markedly elevated on arrival,  decreased after administering Clonidine. Will add HCTZ to current medication regimen. Proteinuria present; will continue Lisinopril. The patient is asked to make an attempt to improve diet and exercise patterns to aid in medical management of this problem. - cloNIDine (CATAPRES) tablet 0.1 mg; Take 1 tablet (0.1 mg total) by mouth once. - COMPLETE METABOLIC PANEL WITH GFR - TSH - Lipid Panel - hydrochlorothiazide (HYDRODIURIL) 12.5 MG tablet; Take 1 tablet (12.5 mg total) by mouth daily.  Dispense: 90 tablet; Refill: 3  2. Type 2 diabetes mellitus without complication, without long-term current use of insulin (HCC) Hemoglobin a1C is 12. Will start Lantus 20 units HS.Patient to return to clinic in 1 month with meter.   - HgB A1c - COMPLETE METABOLIC PANEL WITH GFR - CBC with Differential - insulin glargine (LANTUS) 100 UNIT/ML injection; Inject 0.2 mLs (20 Units total) into the skin at bedtime.  Dispense: 10 mL; Refill: 11 - glucose blood (TRUE METRIX BLOOD GLUCOSE TEST) test strip; Use as instructed  Dispense: 100 each; Refill: 12 - Blood Glucose Monitoring Suppl (TRUE METRIX AIR GLUCOSE METER) w/Device KIT; 1 each by Does not apply route 4 (four) times daily - after meals and at  bedtime.  Dispense: 1 kit; Refill: 0 - Insulin Syringe-Needle U-100 (INSULIN SYRINGE 1CC/30GX1/2") 30G X 1/2" 1 ML MISC; 1 Syringe by Does not apply route daily.  Dispense: 100 each; Refill: 2  3. Neuropathy ([place]) Will start a 1 month trial of gabapentin.  - gabapentin (NEURONTIN) 300 MG capsule; Take 1 capsule (300 mg total) by mouth at bedtime.  Dispense: 60 capsule; Refill: 1  4. Laryngitis, acute Recommend breathing in steam, throat lozenges, and smoking cessation.   5. Tobacco dependence Smoking cessation instruction/counseling given:  counseled patient on the dangers of tobacco use, advised patient to stop smoking, and reviewed strategies to maximize success    Preventative maintenance:  Recommend a lowfat, low carbohydrate diet divided over 5-6 small meals, increase water intake to 6-8 glasses, and 150 minutes per week of cardiovascular exercise.  Routine screening mammogram Screening colonoscopy Hemoccult cards negative    RTC: 1 month for DMII and hypertension. 1 week for bp check.  Dorena Dew, FNP

## 2016-03-23 NOTE — Patient Instructions (Addendum)
Start Lantus 20 units at night for uncontrolled diabetes mellitus. Carbohydrate modified diet. Recommend a lowfat, low carbohydrate diet divided over 5-6 small meals, increase water intake to 6-8 glasses, and 150 minutes per week of cardiovascular exercise. Follow up in 1 month for fasting blood sugar. Bring glucose meter.  Will follow up by phone with lab results  Laryngitis Introduction Laryngitis is swelling (inflammation) of your vocal cords. This causes hoarseness, coughing, loss of voice, sore throat, or a dry throat. When your vocal cords are inflamed, your voice sounds different. Laryngitis can be temporary (acute) or long-term (chronic). Most cases of acute laryngitis improve with time. Chronic laryngitis is laryngitis that lasts for more than three weeks. Follow these instructions at home:  Drink enough fluid to keep your pee (urine) clear or pale yellow.  Breathe in moist air. Use a humidifier if you live in a dry climate.  Take medicines only as told by your doctor.  Do not smoke cigarettes or electronic cigarettes. If you need help quitting, ask your doctor.  Talk as little as possible. Also avoid whispering, which can cause vocal strain.  Write instead of talking. Do this until your voice is back to normal. Contact a doctor if:  You have a fever.  Your pain is worse.  You have trouble swallowing. Get help right away if:  You cough up blood.  You have trouble breathing. This information is not intended to replace advice given to you by your health care provider. Make sure you discuss any questions you have with your health care provider. Document Released: 03/09/2011 Document Revised: 08/26/2015 Document Reviewed: 09/02/2013  2017 Elsevier  Hoarseness Hoarseness is any abnormal change in your voice.Hoarseness can make it difficult to speak. Your voice may sound raspy, breathy, or strained. Hoarseness is caused by a problem with the vocal cords. The vocal cords  are two bands of tissue inside your voice box (larynx). When you speak, your vocal cords move back and forth to create sound. The surfaces of your vocal cords need to be smooth for your voice to sound clear. Swelling or lumps on the vocal cords can cause hoarseness. Common causes of vocal cord problems include:  Upper airway infection.  A long-term cough.  Straining or overusing your voice.  Smoking.  Allergies.  Vocal cord growths.  Stomach acids that flow up from your stomach and irritate your vocal cords (gastroesophageal reflux). Follow these instructions at home: Watch your condition for any changes. To ease any discomfort that you feel:  Rest your voice. Do not whisper. Whispering can cause muscle strain.  Do not speak in a loud or harsh voice that makes your hoarseness worse.  Do not use any tobacco products, including cigarettes, chewing tobacco, or electronic cigarettes. If you need help quitting, ask your health care provider.  Avoid secondhand smoke.  Do not eat foods that give you heartburn. Heartburn can make gastroesophageal reflux worse.  Do not drink coffee.  Do not drink alcohol.  Drink enough fluids to keep your urine clear or pale yellow.  Use a humidifier if the air in your home is dry. Contact a health care provider if:  You have hoarseness that lasts longer than 3 weeks.  You almost lose or completelylose your voice for longer than 3 days.  You have pain when you swallow or try to talk.  You feel a lump in your neck. Get help right away if:  You have trouble swallowing.  You feel as though you are  choking when you swallow.  You cough up blood or vomit blood.  You have trouble breathing. This information is not intended to replace advice given to you by your health care provider. Make sure you discuss any questions you have with your health care provider. Document Released: 03/03/2005 Document Revised: 08/26/2015 Document Reviewed:  03/11/2014 Elsevier Interactive Patient Education  2017 [employer].

## 2016-04-24 ENCOUNTER — Ambulatory Visit (INDEPENDENT_AMBULATORY_CARE_PROVIDER_SITE_OTHER): Payer: Self-pay | Admitting: Family Medicine

## 2016-04-24 ENCOUNTER — Encounter: Payer: Self-pay | Admitting: Family Medicine

## 2016-04-24 VITALS — BP 162/84 | HR 84 | Temp 97.9°F | Resp 14 | Ht 67.0 in | Wt 229.0 lb

## 2016-04-24 DIAGNOSIS — M5442 Lumbago with sciatica, left side: Secondary | ICD-10-CM

## 2016-04-24 DIAGNOSIS — G8929 Other chronic pain: Secondary | ICD-10-CM

## 2016-04-24 DIAGNOSIS — I10 Essential (primary) hypertension: Secondary | ICD-10-CM

## 2016-04-24 DIAGNOSIS — G629 Polyneuropathy, unspecified: Secondary | ICD-10-CM

## 2016-04-24 DIAGNOSIS — G43109 Migraine with aura, not intractable, without status migrainosus: Secondary | ICD-10-CM

## 2016-04-24 DIAGNOSIS — E119 Type 2 diabetes mellitus without complications: Secondary | ICD-10-CM

## 2016-04-24 LAB — POCT URINALYSIS DIP (DEVICE)
Bilirubin Urine: NEGATIVE
Glucose, UA: NEGATIVE mg/dL
Hgb urine dipstick: NEGATIVE
Ketones, ur: NEGATIVE mg/dL
Leukocytes, UA: NEGATIVE
Nitrite: NEGATIVE
Protein, ur: NEGATIVE mg/dL
Specific Gravity, Urine: 1.01 (ref 1.005–1.030)
Urobilinogen, UA: 0.2 mg/dL (ref 0.0–1.0)
pH: 7 (ref 5.0–8.0)

## 2016-04-24 LAB — GLUCOSE, CAPILLARY: Glucose-Capillary: 172 mg/dL — ABNORMAL HIGH (ref 65–99)

## 2016-04-24 MED ORDER — SUMATRIPTAN SUCCINATE 25 MG PO TABS: 25.0000 mg | ORAL_TABLET | Freq: Once | ORAL | 0 refills | Status: DC

## 2016-04-24 MED ORDER — KETOROLAC TROMETHAMINE 60 MG/2ML IM SOLN
60.0000 mg | Freq: Once | INTRAMUSCULAR | Status: AC
  Administered 2016-04-24: 60 mg via INTRAMUSCULAR

## 2016-04-24 MED ORDER — GABAPENTIN 300 MG PO CAPS: 300.0000 mg | ORAL_CAPSULE | Freq: Three times a day (TID) | ORAL | 1 refills | Status: DC

## 2016-04-24 MED ORDER — KETOPROFEN 50 MG PO CAPS: 50.0000 mg | ORAL_CAPSULE | Freq: Four times a day (QID) | ORAL | 0 refills | Status: DC | PRN

## 2016-04-24 MED FILL — SUMATRIPTAN SUCC 25 MG TAB: 25 | 30 days supply | Qty: 10 | Fill #0

## 2016-04-24 MED FILL — GABAPENTIN 300 MG CAPSULE: 300 | 30 days supply | Qty: 90 | Fill #0

## 2016-04-24 NOTE — Patient Instructions (Addendum)
Blood pressure check on Friday.  Imitrex take 1 tablet and repeat in 2 hours if headache persists Ketoprofen every 6 hours as needed     Migraine Headache A migraine headache is an intense, throbbing pain on one side or both sides of the head. Migraines may also cause other symptoms, such as nausea, vomiting, and sensitivity to light and noise. What are the causes? Doing or taking certain things may also trigger migraines, such as:  Alcohol.  Smoking.  Medicines, such as:  Medicine used to treat chest pain (nitroglycerine).  Birth control pills.  Estrogen pills.  Certain blood pressure medicines.  Aged cheeses, chocolate, or caffeine.  Foods or drinks that contain nitrates, glutamate, aspartame, or tyramine.  Physical activity. Other things that may trigger a migraine include:  Menstruation.  Pregnancy.  Hunger.  Stress, lack of sleep, too much sleep, or fatigue.  Weather changes. What increases the risk? The following factors may make you more likely to experience migraine headaches:  Age. Risk increases with age.  Family history of migraine headaches.  Being Caucasian.  Depression and anxiety.  Obesity.  Being a woman.  Having a hole in the heart (patent foramen ovale) or other heart problems. What are the signs or symptoms? The main symptom of this condition is pulsating or throbbing pain. Pain may:  Happen in any area of the head, such as on one side or both sides.  Interfere with daily activities.  Get worse with physical activity.  Get worse with exposure to bright lights or loud noises. Other symptoms may include:  Nausea.  Vomiting.  Dizziness.  General sensitivity to bright lights, loud noises, or smells. Before you get a migraine, you may get warning signs that a migraine is developing (aura). An aura may include:  Seeing flashing lights or having blind spots.  Seeing bright spots, halos, or zigzag lines.  Having tunnel  vision or blurred vision.  Having numbness or a tingling feeling.  Having trouble talking.  Having muscle weakness. How is this diagnosed? A migraine headache can be diagnosed based on:  Your symptoms.  A physical exam.  Tests, such as CT scan or MRI of the head. These imaging tests can help rule out other causes of headaches.  Taking fluid from the spine (lumbar puncture) and analyzing it (cerebrospinal fluid analysis, or CSF analysis). How is this treated? A migraine headache is usually treated with medicines that:  Relieve pain.  Relieve nausea.  Prevent migraines from coming back. Treatment may also include:  Acupuncture.  Lifestyle changes like avoiding foods that trigger migraines. Follow these instructions at home: Medicines  Take over-the-counter and prescription medicines only as told by your health care provider.  Do not drive or use heavy machinery while taking prescription pain medicine.  To prevent or treat constipation while you are taking prescription pain medicine, your health care provider may recommend that you:  Drink enough fluid to keep your urine clear or pale yellow.  Take over-the-counter or prescription medicines.  Eat foods that are high in fiber, such as fresh fruits and vegetables, whole grains, and beans.  Limit foods that are high in fat and processed sugars, such as fried and sweet foods. Lifestyle  Avoid alcohol use.  Do not use any products that contain nicotine or tobacco, such as cigarettes and e-cigarettes. If you need help quitting, ask your health care provider.  Get at least 8 hours of sleep every night.  Limit your stress. General instructions  Keep a journal  to find out what may trigger your migraine headaches. For example, write down:  What you eat and drink.  How much sleep you get.  Any change to your diet or medicines.  If you have a migraine:  Avoid things that make your symptoms worse, such as bright  lights.  It may help to lie down in a dark, quiet room.  Do not drive or use heavy machinery.  Ask your health care provider what activities are safe for you while you are experiencing symptoms.  Keep all follow-up visits as told by your health care provider. This is important. Contact a health care provider if:  You develop symptoms that are different or more severe than your usual migraine symptoms. Get help right away if:  Your migraine becomes severe.  You have a fever.  You have a stiff neck.  You have vision loss.  Your muscles feel weak or like you cannot control them.  You start to lose your balance often.  You develop trouble walking.  You faint. This information is not intended to replace advice given to you by your health care provider. Make sure you discuss any questions you have with your health care provider. Document Released: 03/20/2005 Document Revised: 10/08/2015 Document Reviewed: 09/06/2015 Elsevier Interactive Patient Education  2017 [employer].  Back Exercises Introduction If you have pain in your back, do these exercises 2-3 times each day or as told by your doctor. When the pain goes away, do the exercises once each day, but repeat the steps more times for each exercise (do more repetitions). If you do not have pain in your back, do these exercises once each day or as told by your doctor. Exercises Single Knee to Chest  Do these steps 3-5 times in a row for each leg: 1. Lie on your back on a firm bed or the floor with your legs stretched out. 2. Bring one knee to your chest. 3. Hold your knee to your chest by grabbing your knee or thigh. 4. Pull on your knee until you feel a gentle stretch in your lower back. 5. Keep doing the stretch for 10-30 seconds. 6. Slowly let go of your leg and straighten it. Pelvic Tilt  Do these steps 5-10 times in a row: 1. Lie on your back on a firm bed or the floor with your legs stretched out. 2. Bend your knees  so they point up to the ceiling. Your feet should be flat on the floor. 3. Tighten your lower belly (abdomen) muscles to press your lower back against the floor. This will make your tailbone point up to the ceiling instead of pointing down to your feet or the floor. 4. Stay in this position for 5-10 seconds while you gently tighten your muscles and breathe evenly. Cat-Cow  Do these steps until your lower back bends more easily: 1. Get on your hands and knees on a firm surface. Keep your hands under your shoulders, and keep your knees under your hips. You may put padding under your knees. 2. Let your head hang down, and make your tailbone point down to the floor so your lower back is round like the back of a cat. 3. Stay in this position for 5 seconds. 4. Slowly lift your head and make your tailbone point up to the ceiling so your back hangs low (sags) like the back of a cow. 5. Stay in this position for 5 seconds. Press-Ups  Do these steps 5-10 times in a  row: 1. Lie on your belly (face-down) on the floor. 2. Place your hands near your head, about shoulder-width apart. 3. While you keep your back relaxed and keep your hips on the floor, slowly straighten your arms to raise the top half of your body and lift your shoulders. Do not use your back muscles. To make yourself more comfortable, you may change where you place your hands. 4. Stay in this position for 5 seconds. 5. Slowly return to lying flat on the floor. Bridges  Do these steps 10 times in a row: 1. Lie on your back on a firm surface. 2. Bend your knees so they point up to the ceiling. Your feet should be flat on the floor. 3. Tighten your butt muscles and lift your butt off of the floor until your waist is almost as high as your knees. If you do not feel the muscles working in your butt and the back of your thighs, slide your feet 1-2 inches farther away from your butt. 4. Stay in this position for 3-5 seconds. 5. Slowly lower your  butt to the floor, and let your butt muscles relax. If this exercise is too easy, try doing it with your arms crossed over your chest. Belly Crunches  Do these steps 5-10 times in a row: 1. Lie on your back on a firm bed or the floor with your legs stretched out. 2. Bend your knees so they point up to the ceiling. Your feet should be flat on the floor. 3. Cross your arms over your chest. 4. Tip your chin a little bit toward your chest but do not bend your neck. 5. Tighten your belly muscles and slowly raise your chest just enough to lift your shoulder blades a tiny bit off of the floor. 6. Slowly lower your chest and your head to the floor. Back Lifts  Do these steps 5-10 times in a row: 1. Lie on your belly (face-down) with your arms at your sides, and rest your forehead on the floor. 2. Tighten the muscles in your legs and your butt. 3. Slowly lift your chest off of the floor while you keep your hips on the floor. Keep the back of your head in line with the curve in your back. Look at the floor while you do this. 4. Stay in this position for 3-5 seconds. 5. Slowly lower your chest and your face to the floor. Contact a doctor if:  Your back pain gets a lot worse when you do an exercise.  Your back pain does not lessen 2 hours after you exercise. If you have any of these problems, stop doing the exercises. Do not do them again unless your doctor says it is okay. Get help right away if:  You have sudden, very bad back pain. If this happens, stop doing the exercises. Do not do them again unless your doctor says it is okay. This information is not intended to replace advice given to you by your health care provider. Make sure you discuss any questions you have with your health care provider. Document Released: 04/22/2010 Document Revised: 08/26/2015 Document Reviewed: 05/14/2014  2017 Elsevier

## 2016-04-24 NOTE — Progress Notes (Signed)
Subjective:    Patient ID: Catherine Pope, female    DOB: 12-19-1965, 51 y.o.   MRN: [id number]  Hypertension  This is a chronic problem. The current episode started more than 1 year ago (Patient has been out of medications for 3 days). The problem is unchanged. The problem is uncontrolled. Associated symptoms include malaise/fatigue. Pertinent negatives include no anxiety, blurred vision, chest pain, headaches, orthopnea, palpitations, peripheral edema, shortness of breath or sweats. There are no associated agents to hypertension. Risk factors for coronary artery disease include diabetes mellitus, dyslipidemia, obesity, sedentary lifestyle and smoking/tobacco exposure. Past treatments include diuretics and calcium channel blockers. The current treatment provides mild improvement. There is no history of angina, kidney disease, CAD/MI, CVA, heart failure, left ventricular hypertrophy or renovascular disease. There is no history of hyperaldosteronism, hypercortisolism, hyperparathyroidism, a hypertension causing med, pheochromocytoma, sleep apnea or a thyroid problem.  Diabetes  She presents for her follow-up diabetic visit. She has type 2 diabetes mellitus. Her disease course has been worsening. Pertinent negatives for hypoglycemia include no headaches or sweats. Associated symptoms include fatigue and foot paresthesias. Pertinent negatives for diabetes include no blurred vision, no chest pain, no foot ulcerations, no polydipsia, no polyphagia, no polyuria, no visual change, no weakness and no weight loss. Symptoms are worsening. Pertinent negatives for diabetic complications include no CVA. Risk factors for coronary artery disease include dyslipidemia, diabetes mellitus, obesity and hypertension. She is compliant with treatment some of the time. When asked about meal planning, she reported none. She has not had a previous visit with a dietitian. She rarely participates in exercise. An ACE  inhibitor/angiotensin II receptor blocker is being taken. She does not see a podiatrist.Eye exam is not current.  Back Pain  This is a chronic problem. The current episode started more than 1 year ago. The problem occurs intermittently. The problem is unchanged. The pain is present in the lumbar spine. The quality of the pain is described as aching. The pain is at a severity of 7/10. The pain is moderate. The symptoms are aggravated by bending, sitting, lying down and standing. Associated symptoms include numbness. Pertinent negatives include no bladder incontinence, chest pain, dysuria, fever, headaches, pelvic pain, weakness or weight loss. Risk factors include lack of exercise, obesity and sedentary lifestyle. She has tried NSAIDs for the symptoms. The treatment provided no relief.  Migraine   The current episode started more than 1 year ago (Pt was a participant in a headache trial prior to losing health insurande benefits). The pain is located in the bilateral region. The pain does not radiate. The quality of the pain is described as dull and aching. Associated symptoms include back pain and numbness. Pertinent negatives include no blurred vision, fever, visual change, weakness or weight loss. The symptoms are aggravated by noise. She has tried darkened room for the symptoms. Her past medical history is significant for hypertension, migraine headaches and obesity.   Past Medical History:  Diagnosis Date  . Diabetes mellitus   . Hypertension   . Migraine   . Sinusitis    Social History   Social History  . Marital status: Single    Spouse name: N/A  . Number of children: 2  . Years of education: 39 th   Occupational History  . unemployed    Social History Main Topics  . Smoking status: Current Every Day Smoker    Packs/day: 0.50    Types: Cigarettes  . Smokeless tobacco: Never Used  . Alcohol use  No  . Drug use: No  . Sexual activity: Not on file   Other Topics Concern  . Not on  file   Social History Narrative   Patient lives at home with her daughters and she is unemployed. Patient has two children.   Patient has a high school education.   Right handed.    Caffeine two cups daily.  No Known Allergies    Review of Systems  Constitutional: Positive for fatigue and malaise/fatigue. Negative for fever and weight loss.  HENT: Negative.   Eyes: Negative for blurred vision.  Respiratory: Negative.  Negative for shortness of breath.   Cardiovascular: Negative.  Negative for chest pain, palpitations and orthopnea.  Gastrointestinal: Negative.   Endocrine: Negative for polydipsia, polyphagia and polyuria.  Genitourinary: Negative.  Negative for bladder incontinence, dysuria and pelvic pain.  Musculoskeletal: Positive for back pain and myalgias.       Right hip pain with sciatica  Neurological: Positive for numbness. Negative for weakness and headaches.  Hematological: Negative.        Objective:   Physical Exam  Constitutional: She is oriented to person, place, and time. She appears well-developed and well-nourished.  HENT:  Head: Normocephalic and atraumatic.  Right Ear: External ear normal.  Left Ear: External ear normal.  Nose: Nose normal.  Eyes: Conjunctivae are normal. Pupils are equal, round, and reactive to light.  Neck: Normal range of motion. Neck supple.  Cardiovascular: Normal rate, regular rhythm, normal heart sounds and intact distal pulses.   Pulmonary/Chest: Effort normal and breath sounds normal.  Abdominal: Soft. Bowel sounds are normal.  Musculoskeletal:       Lumbar back: She exhibits decreased range of motion, pain and spasm.  Neurological: She is alert and oriented to person, place, and time. She has normal reflexes.  Skin: Skin is warm and dry.  Psychiatric: She has a normal mood and affect. Her behavior is normal. Judgment and thought content normal.      BP (!) 162/84 (BP Location: Right Arm, Patient Position: Sitting, Cuff  Size: Large)   Pulse 84   Temp 97.9 F (36.6 C) (Oral)   Resp 14   Ht 5\' 7"  (1.702 m)   Wt 229 lb (103.9 kg)   SpO2 99%   BMI 35.87 kg/m  Assessment & Plan:  1. Neuropathy (HCC) Will increase gabapentin to 300 mg TID.  - gabapentin (NEURONTIN) 300 MG capsule; Take 1 capsule (300 mg total) by mouth 3 (three) times daily.  Dispense: 90 capsule; Refill: 1  2. Migraine with aura and without status migrainosus, not intractable - ketoprofen (ORUDIS) 50 MG capsule; Take 1 capsule (50 mg total) by mouth 4 (four) times daily as needed.  Dispense: 30 capsule; Refill: 0 - ketorolac (TORADOL) injection 60 mg; Inject 2 mLs (60 mg total) into the muscle once. - SUMAtriptan (IMITREX) 25 MG tablet; Take 1 tablet (25 mg total) by mouth once. May repeat in 2 hours if headache persists or recurs.  Dispense: 10 tablet; Refill: 0  3. Type 2 diabetes mellitus without complication, without long-term current use of insulin (HCC) Fasting glucose has improved < 200. Will re-check hemoglobin a1C in 3 months.  - Glucose (CBG)  4. Chronic bilateral low back pain with left-sided sciatica - gabapentin (NEURONTIN) 300 MG capsule; Take 1 capsule (300 mg total) by mouth 3 (three) times daily.  Dispense: 90 capsule; Refill: 1 - ketorolac (TORADOL) injection 60 mg; Inject 2 mLs (60 mg total) into the muscle once. -  Preventative maintenance:  Recommend a lowfat, low carbohydrate diet divided over 5-6 small meals, increase water intake to 6-8 glasses, and 150 minutes per week of cardiovascular exercise.  Routine screening mammogram Screening colonoscopy Hemoccult cards negative    RTC: 3 months for DMII and hypertension. 1 week for bp check.  Dorena Dew, FNP

## 2016-04-27 MED FILL — TRUE METRIX TEST STRIP: 30 days supply | Qty: 100 | Fill #1

## 2016-04-27 MED FILL — TRUEPLUS SYR 0.3ML 30GX5/16: 30G X 5/16" | 30 days supply | Qty: 100 | Fill #1

## 2016-04-27 MED FILL — CITALOPRAM HBR 40 MG TABLET: 40 | 33 days supply | Qty: 30 | Fill #4

## 2016-04-27 MED FILL — busPIRone HCL 10 MG TABS: 10 | 45 days supply | Qty: 90 | Fill #2

## 2016-04-27 MED FILL — LISINOPRIL 20 MG TABLET: 20 | 30 days supply | Qty: 30 | Fill #4

## 2016-04-27 MED FILL — LOVASTATIN 20 MG TABLET: 20 | 30 days supply | Qty: 30 | Fill #4

## 2016-04-27 MED FILL — ?HYDROCHLOROTHIAZIDE 12.5MG: 12.5 | 30 days supply | Qty: 30 | Fill #1

## 2016-06-05 MED FILL — CITALOPRAM HBR 40 MG TABLET: 40 | 33 days supply | Qty: 30 | Fill #5

## 2016-06-05 MED FILL — ?HYDROCHLOROTHIAZIDE 12.5MG: 12.5 | 30 days supply | Qty: 30 | Fill #2

## 2016-06-05 MED FILL — GABAPENTIN 300 MG CAPSULE: 300 | 30 days supply | Qty: 90 | Fill #1

## 2016-06-05 MED FILL — LISINOPRIL 20 MG TABLET: 20 | 30 days supply | Qty: 30 | Fill #5

## 2016-06-05 MED FILL — LOVASTATIN 20 MG TABLET: 20 | 30 days supply | Qty: 30 | Fill #5

## 2016-06-07 ENCOUNTER — Other Ambulatory Visit: Payer: Self-pay | Admitting: Pharmacist

## 2016-06-07 MED ORDER — INSULIN DETEMIR 100 UNIT/ML ~~LOC~~ SOLN: SUBCUTANEOUS | 0 refills | Status: DC

## 2016-06-07 MED FILL — !LEVEMIR 100 UNITS/ML VIAL: 100/ML | 28 days supply | Qty: 10 | Fill #0

## 2016-07-07 ENCOUNTER — Other Ambulatory Visit: Payer: Self-pay | Admitting: Family Medicine

## 2016-07-07 DIAGNOSIS — G629 Polyneuropathy, unspecified: Secondary | ICD-10-CM

## 2016-07-07 DIAGNOSIS — G43109 Migraine with aura, not intractable, without status migrainosus: Secondary | ICD-10-CM

## 2016-07-07 DIAGNOSIS — M5442 Lumbago with sciatica, left side: Secondary | ICD-10-CM

## 2016-07-07 DIAGNOSIS — G8929 Other chronic pain: Secondary | ICD-10-CM

## 2016-07-07 MED FILL — ?LOVASTATIN 20MG TABLET: 20 | 30 days supply | Qty: 30 | Fill #6

## 2016-07-07 MED FILL — !LANTUS 100 UNITS/ML VIAL: 100 | 50 days supply | Qty: 10 | Fill #1

## 2016-07-07 MED FILL — LISINOPRIL 20 MG TABLET: 20 | 30 days supply | Qty: 30 | Fill #6

## 2016-07-07 MED FILL — GABAPENTIN 300 MG CAPSULE: 300 | 30 days supply | Qty: 30 | Fill #1

## 2016-07-07 MED FILL — ?CITALOPRAM HBR 40 MG TABLE: 40 | 33 days supply | Qty: 30 | Fill #6

## 2016-07-07 MED FILL — ?HYDROCHLOROTHIAZIDE 12.5MG: 12.5 | 30 days supply | Qty: 30 | Fill #3

## 2016-07-07 MED FILL — ?METFORMIN HCL 1,000 MG TAB: 1000 | 30 days supply | Qty: 60 | Fill #1

## 2016-07-12 MED FILL — ?BUSPIRONE HCL 10 MG TABLET: 10 | 45 days supply | Qty: 90 | Fill #3

## 2016-07-25 ENCOUNTER — Ambulatory Visit (INDEPENDENT_AMBULATORY_CARE_PROVIDER_SITE_OTHER): Payer: Self-pay | Admitting: Family Medicine

## 2016-07-25 ENCOUNTER — Encounter: Payer: Self-pay | Admitting: Family Medicine

## 2016-07-25 VITALS — BP 140/80 | HR 104 | Temp 98.7°F | Resp 16 | Ht 67.0 in | Wt 234.0 lb

## 2016-07-25 DIAGNOSIS — M5442 Lumbago with sciatica, left side: Secondary | ICD-10-CM

## 2016-07-25 DIAGNOSIS — M25552 Pain in left hip: Secondary | ICD-10-CM

## 2016-07-25 DIAGNOSIS — E119 Type 2 diabetes mellitus without complications: Secondary | ICD-10-CM

## 2016-07-25 DIAGNOSIS — F411 Generalized anxiety disorder: Secondary | ICD-10-CM

## 2016-07-25 DIAGNOSIS — I10 Essential (primary) hypertension: Secondary | ICD-10-CM

## 2016-07-25 DIAGNOSIS — G8929 Other chronic pain: Secondary | ICD-10-CM

## 2016-07-25 LAB — COMPLETE METABOLIC PANEL WITH GFR
AG Ratio: 1.3 Ratio (ref 1.0–2.5)
ALT: 20 U/L (ref 6–29)
AST: 28 U/L (ref 10–35)
Albumin: 4.2 g/dL (ref 3.6–5.1)
Alkaline Phosphatase: 60 U/L (ref 33–115)
BUN/Creatinine Ratio: 9.7 Ratio (ref 6–22)
BUN: 7 mg/dL (ref 7–25)
CO2: 19 mmol/L — ABNORMAL LOW (ref 20–31)
Calcium: 10 mg/dL (ref 8.6–10.2)
Chloride: 100 mmol/L (ref 98–110)
Creat: 0.72 mg/dL (ref 0.50–1.10)
GFR, Est African American: >89 mL/min (ref >=60)
GFR, Est Non African American: >89 mL/min (ref >=60)
Globulin: 3.3 g/dL (ref 1.9–3.7)
Glucose, Bld: 328 mg/dL — ABNORMAL HIGH (ref 65–99)
Potassium: 4.3 mmol/L (ref 3.5–5.3)
Sodium: 132 mmol/L — ABNORMAL LOW (ref 135–146)
Total Bilirubin: 0.3 mg/dL (ref 0.2–1.2)
Total Protein: 7.5 g/dL (ref 6.1–8.1)

## 2016-07-25 LAB — CBC WITH DIFFERENTIAL/PLATELET
Basophils Absolute: 0 cells/uL (ref 0–200)
Basophils Relative: 0 %
Eosinophils Absolute: 109 cells/uL (ref 15–500)
Eosinophils Relative: 1 %
HCT: 40.4 % (ref 35.0–45.0)
Hemoglobin: 13.1 g/dL (ref 11.7–15.5)
Lymphocytes Relative: 32 %
Lymphs Abs: 3488 cells/uL (ref 850–3900)
MCH: 29 pg (ref 27.0–33.0)
MCHC: 32.4 g/dL (ref 32.0–36.0)
MCV: 89.6 fL (ref 80.0–100.0)
MPV: 11 fL (ref 7.5–12.5)
Monocytes Absolute: 545 cells/uL (ref 200–950)
Monocytes Relative: 5 %
Neutro Abs: 6758 cells/uL (ref 1500–7800)
Neutrophils Relative %: 62 %
Platelets: 293 10*3/uL (ref 140–400)
RBC: 4.51 MIL/uL (ref 3.80–5.10)
RDW: 13.6 % (ref 11.0–15.0)
WBC: 10.9 10*3/uL — ABNORMAL HIGH (ref 3.8–10.8)

## 2016-07-25 LAB — LIPID PANEL
Cholesterol: 147 mg/dL (ref <200)
HDL: 27 mg/dL — ABNORMAL LOW (ref >50)
LDL Cholesterol: 99 mg/dL (ref <100)
Total CHOL/HDL Ratio: 5.4 Ratio — ABNORMAL HIGH (ref <5.0)
Triglycerides: 104 mg/dL (ref <150)
VLDL: 21 mg/dL (ref <30)

## 2016-07-25 LAB — POCT URINALYSIS DIP (DEVICE)
Bilirubin Urine: NEGATIVE
Glucose, UA: 500 mg/dL — AB
Hgb urine dipstick: NEGATIVE
Ketones, ur: NEGATIVE mg/dL
Leukocytes, UA: NEGATIVE
Nitrite: NEGATIVE
Protein, ur: NEGATIVE mg/dL
Specific Gravity, Urine: 1.015 (ref 1.005–1.030)
Urobilinogen, UA: 1 mg/dL (ref 0.0–1.0)
pH: 5.5 (ref 5.0–8.0)

## 2016-07-25 LAB — POCT GLYCOSYLATED HEMOGLOBIN (HGB A1C): Hemoglobin A1C: 11.3

## 2016-07-25 LAB — GLUCOSE, CAPILLARY
Glucose-Capillary: 331 mg/dL — ABNORMAL HIGH (ref 65–99)
Glucose-Capillary: 333 mg/dL — ABNORMAL HIGH (ref 65–99)

## 2016-07-25 MED ORDER — VENLAFAXINE HCL ER 75 MG PO CP24: 75.0000 mg | ORAL_CAPSULE | Freq: Every day | ORAL | 0 refills | Status: DC

## 2016-07-25 MED ORDER — INJECTION DEVICE FOR INSULIN DEVI
Freq: Once | Status: AC
  Administered 2016-07-25: 16:00:00

## 2016-07-25 MED ORDER — CLONAZEPAM 0.5 MG PO TABS: 0.5000 mg | ORAL_TABLET | Freq: Two times a day (BID) | ORAL | 0 refills | Status: DC | PRN

## 2016-07-25 MED ORDER — SAXAGLIPTIN HCL 2.5 MG PO TABS: 2.5000 mg | ORAL_TABLET | Freq: Every day | ORAL | 0 refills | Status: DC

## 2016-07-25 MED ORDER — METHOCARBAMOL 500 MG PO TABS: 500.0000 mg | ORAL_TABLET | Freq: Four times a day (QID) | ORAL | 0 refills | Status: DC

## 2016-07-25 MED ORDER — METFORMIN HCL 1000 MG PO TABS: ORAL_TABLET | ORAL | 3 refills | Status: DC

## 2016-07-25 MED FILL — VENLAFAXINE HCL ER 75 MG CA: 75 | 30 days supply | Qty: 30 | Fill #0

## 2016-07-25 MED FILL — METHOCARBAMOL 500 MG TABLET: 500 | 30 days supply | Qty: 120 | Fill #0

## 2016-07-25 MED FILL — !ONGLYZA 2.5 MG TAB: 2.5 | 30 days supply | Qty: 30 | Fill #0

## 2016-07-25 NOTE — Patient Instructions (Addendum)
Metformin  500 mg in the morning for breakfast and 1000 mg at dinner.  Start Onglyza 2.5 mg once daily for diabetes management. Continue your Levemir as prescribed.  For anxiety and depression  Start Effexor 75 mg daily with breakfast.  Take Klonopin 0.5 mg twice daily for anxiety.     Diabetes Mellitus and Food It is important for you to manage your blood sugar (glucose) level. Your blood glucose level can be greatly affected by what you eat. Eating healthier foods in the appropriate amounts throughout the day at about the same time each day will help you control your blood glucose level. It can also help slow or prevent worsening of your diabetes mellitus. Healthy eating may even help you improve the level of your blood pressure and reach or maintain a healthy weight. General recommendations for healthful eating and cooking habits include:  Eating meals and snacks regularly. Avoid going long periods of time without eating to lose weight.  Eating a diet that consists mainly of plant-based foods, such as fruits, vegetables, nuts, legumes, and whole grains.  Using low-heat cooking methods, such as baking, instead of high-heat cooking methods, such as deep frying. Work with your dietitian to make sure you understand how to use the Nutrition Facts information on food labels. How can food affect me? Carbohydrates  Carbohydrates affect your blood glucose level more than any other type of food. Your dietitian will help you determine how many carbohydrates to eat at each meal and teach you how to count carbohydrates. Counting carbohydrates is important to keep your blood glucose at a healthy level, especially if you are using insulin or taking certain medicines for diabetes mellitus. Alcohol  Alcohol can cause sudden decreases in blood glucose (hypoglycemia), especially if you use insulin or take certain medicines for diabetes mellitus. Hypoglycemia can be a life-threatening condition. Symptoms  of hypoglycemia (sleepiness, dizziness, and disorientation) are similar to symptoms of having too much alcohol. If your health care provider has given you approval to drink alcohol, do so in moderation and use the following guidelines:  Women should not have more than one drink per day, and men should not have more than two drinks per day. One drink is equal to:  12 oz of beer.  5 oz of wine.  1 oz of hard liquor.  Do not drink on an empty stomach.  Keep yourself hydrated. Have water, diet soda, or unsweetened iced tea.  Regular soda, juice, and other mixers might contain a lot of carbohydrates and should be counted. What foods are not recommended? As you make food choices, it is important to remember that all foods are not the same. Some foods have fewer nutrients per serving than other foods, even though they might have the same number of calories or carbohydrates. It is difficult to get your body what it needs when you eat foods with fewer nutrients. Examples of foods that you should avoid that are high in calories and carbohydrates but low in nutrients include:  Trans fats (most processed foods list trans fats on the Nutrition Facts label).  Regular soda.  Juice.  Candy.  Sweets, such as cake, pie, doughnuts, and cookies.  Fried foods. What foods can I eat? Eat nutrient-rich foods, which will nourish your body and keep you healthy. The food you should eat also will depend on several factors, including:  The calories you need.  The medicines you take.  Your weight.  Your blood glucose level.  Your blood pressure level.  Your cholesterol level. You should eat a variety of foods, including:  Protein.  Lean cuts of meat.  Proteins low in saturated fats, such as fish, egg whites, and beans. Avoid processed meats.  Fruits and vegetables.  Fruits and vegetables that may help control blood glucose levels, such as apples, mangoes, and yams.  Dairy  products.  Choose fat-free or low-fat dairy products, such as milk, yogurt, and cheese.  Grains, bread, pasta, and rice.  Choose whole grain products, such as multigrain bread, whole oats, and brown rice. These foods may help control blood pressure.  Fats.  Foods containing healthful fats, such as nuts, avocado, olive oil, canola oil, and fish. Does everyone with diabetes mellitus have the same meal plan? Because every person with diabetes mellitus is different, there is not one meal plan that works for everyone. It is very important that you meet with a dietitian who will help you create a meal plan that is just right for you. This information is not intended to replace advice given to you by your health care provider. Make sure you discuss any questions you have with your health care provider. Document Released: 12/15/2004 Document Revised: 08/26/2015 Document Reviewed: 02/14/2013 Elsevier Interactive Patient Education  2017 [employer].
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
## 2016-07-26 LAB — THYROID PANEL WITH TSH
Free Thyroxine Index: 2.5 (ref 1.4–3.8)
T3 Uptake: 27 % (ref 22–35)
T4, Total: 9.4 ug/dL (ref 4.5–12.0)
TSH: 2.14 mIU/L

## 2016-07-27 NOTE — Addendum Note (Signed)
Addended by: Scot Jun on: 07/27/2016 09:25 PM   Modules accepted: Orders

## 2016-07-30 ENCOUNTER — Telehealth: Payer: Self-pay | Admitting: Family Medicine

## 2016-07-30 MED ORDER — TOPIRAMATE 25 MG PO TABS: ORAL_TABLET | ORAL | 0 refills | Status: DC

## 2016-07-30 NOTE — Telephone Encounter (Signed)
Please call Catherine Pope to advise I sent over the Topamax for her migraine headache prevention as we discussed during her last office visit. Explain that the medication should be taken exactly how it is prescribed. If she has questions, please call our office or ask pharmacists.

## 2016-07-31 MED FILL — TOPIRAMATE 25 MG TABLET: 25 | 30 days supply | Qty: 75 | Fill #0

## 2016-07-31 NOTE — Telephone Encounter (Signed)
Patient given information to [facility] at [phone]

## 2016-08-01 ENCOUNTER — Other Ambulatory Visit: Payer: Self-pay | Admitting: Family Medicine

## 2016-08-01 MED ORDER — TOPIRAMATE 25 MG PO TABS: ORAL_TABLET | ORAL | 0 refills | Status: DC

## 2016-08-08 ENCOUNTER — Ambulatory Visit (INDEPENDENT_AMBULATORY_CARE_PROVIDER_SITE_OTHER): Payer: Medicaid Other | Admitting: Family Medicine

## 2016-08-08 ENCOUNTER — Encounter: Payer: Self-pay | Admitting: Family Medicine

## 2016-08-08 VITALS — BP 150/72 | HR 109 | Temp 97.9°F | Resp 18 | Ht 67.0 in | Wt 236.0 lb

## 2016-08-08 DIAGNOSIS — R51 Headache: Secondary | ICD-10-CM | POA: Diagnosis not present

## 2016-08-08 DIAGNOSIS — F419 Anxiety disorder, unspecified: Secondary | ICD-10-CM

## 2016-08-08 DIAGNOSIS — I10 Essential (primary) hypertension: Secondary | ICD-10-CM | POA: Diagnosis not present

## 2016-08-08 DIAGNOSIS — R519 Headache, unspecified: Secondary | ICD-10-CM

## 2016-08-08 MED ORDER — CLONAZEPAM 0.5 MG PO TABS: 0.5000 mg | ORAL_TABLET | Freq: Two times a day (BID) | ORAL | 0 refills | Status: DC | PRN

## 2016-08-08 MED ORDER — SUMATRIPTAN SUCCINATE 50 MG PO TABS: 50.0000 mg | ORAL_TABLET | ORAL | 0 refills | Status: DC | PRN

## 2016-08-08 MED FILL — SUMATRIPTAN SUCC 50 MG TAB: 50 | 30 days supply | Qty: 10 | Fill #0

## 2016-08-08 NOTE — Patient Instructions (Addendum)
For acute headaches, take Imitrex 100 mg at the initial onset of headache pain.  Continue Topamax as prescribed. The medication will take a month to completely take effect.  Continue blood pressure medication as prescribed and track your readings.

## 2016-08-08 NOTE — Progress Notes (Signed)
Patient ID: Catherine Pope, female    DOB: [birth year], 51 y.o.   MRN: [id number]  PCP: Scot Jun, FNP  Chief Complaint  Patient presents with  . Follow-up    anxiety    Subjective:  HPI  Catherine Pope is a 51 y.o. female presents for  headaches and hypertension follow-up.  Headaches  Hasn't consistently taken the Topamax for headache prevention. Continues to have headaches 2-3 weekly bit reports the pain is not as intense as previously. For acute headaches she is taking Imitrex as needed.  Hypertension Blood pressure remains elevated today. Lulabelle reports that she hasn't taken her blood pressure medication today. Feels that her blood pressure is only elevated when she comes into the clinic. No routine monitoring of blood pressure at home.  Anxiety with Panic Attacks  [name] has been seen and evaluated at [facility] since her last visit. She reports that her first psychiatry visit with [facility] is scheduled for 08/21/16. She is still experiencing symptoms of anxiety and panic when driving. Reports taking Effexor consistently and Klonopin. Doesn't understand why symptoms are still present.  Social History   Social History  . Marital status: Single    Spouse name: N/A  . Number of children: 2  . Years of education: 29 th   Occupational History  . unemployed    Social History Main Topics  . Smoking status: Current Every Day Smoker    Packs/day: 0.50    Types: Cigarettes  . Smokeless tobacco: Never Used  . Alcohol use No  . Drug use: No  . Sexual activity: Not on file   Other Topics Concern  . Not on file   Social History Narrative   Patient lives at home with her daughters and she is unemployed. Patient has two children.   Patient has a high school education.   Right handed.    Caffeine two cups daily.    Family History  Problem Relation Age of Onset  . Heart Problems Father      Review of Systems  Patient Active Problem List   Diagnosis Date Noted   . Essential hypertension 03/23/2016  . Laryngitis, acute 03/23/2016  . Neuropathy 03/23/2016  . Tobacco dependence 03/23/2016  . Diabetes mellitus ([place]) 11/26/2013  . Morbid obesity ([place]) 11/26/2013  . Migraine without aura 11/26/2013  . Other and unspecified hyperlipidemia 11/26/2013    No Known Allergies  Prior to Admission medications   Medication Sig Start Date End Date Taking? Authorizing Provider  Blood Glucose Monitoring Suppl (TRUE METRIX AIR GLUCOSE METER) w/Device KIT 1 each by Does not apply route 4 (four) times daily - after meals and at bedtime. 03/23/16  Yes Dorena Dew, FNP  clonazePAM (KLONOPIN) 0.5 MG tablet Take 1 tablet (0.5 mg total) by mouth 2 (two) times daily as needed for anxiety. 07/25/16  Yes Scot Jun, FNP  fish oil-omega-3 fatty acids 1000 MG capsule Take 2 capsules (2 g total) by mouth daily. 06/22/14  Yes Ernestina Patches, MD  fluticasone (FLONASE) 50 MCG/ACT nasal spray Place 2 sprays into both nostrils daily. 06/22/14  Yes Ernestina Patches, MD  glucose blood (TRUE METRIX BLOOD GLUCOSE TEST) test strip Use as instructed 03/23/16  Yes Dorena Dew, FNP  hydrochlorothiazide (HYDRODIURIL) 12.5 MG tablet Take 1 tablet (12.5 mg total) by mouth daily. 03/23/16  Yes Dorena Dew, FNP  insulin detemir (LEVEMIR) 100 UNIT/ML injection INJECT 20 UNITS INTO THE SKIN AT BEDTIME. ONE TIME ORDER UNTIL LANTUS GETS APPROVED VIA  PASS 06/07/16  Yes Dorena Dew, FNP  insulin glargine (LANTUS) 100 UNIT/ML injection Inject 0.2 mLs (20 Units total) into the skin at bedtime. 03/23/16  Yes Dorena Dew, FNP  Insulin Syringe-Needle U-100 (INSULIN SYRINGE 1CC/30GX1/2") 30G X 1/2" 1 ML MISC 1 Syringe by Does not apply route daily. 03/23/16  Yes Dorena Dew, FNP  ketoprofen (ORUDIS) 50 MG capsule Take 1 capsule (50 mg total) by mouth 4 (four) times daily as needed. 04/24/16  Yes Dorena Dew, FNP  Lancets [facility] ULTRASOFT) lancets Use as  instructed 06/22/14  Yes Ernestina Patches, MD  lisinopril (PRINIVIL,ZESTRIL) 20 MG tablet Take 1 tablet (20 mg total) by mouth daily. 08/20/15  Yes Micheline Chapman, NP  lovastatin (MEVACOR) 20 MG tablet Take 1 tablet (20 mg total) by mouth at bedtime. 08/20/15  Yes Micheline Chapman, NP  metFORMIN (GLUCOPHAGE) 1000 MG tablet 500 mg with breakfast and 1000 mg at dinnertime 07/25/16  Yes Scot Jun, FNP  methocarbamol (ROBAXIN) 500 MG tablet Take 1 tablet (500 mg total) by mouth 4 (four) times daily. 07/25/16  Yes Scot Jun, FNP  QUEtiapine (SEROQUEL) 50 MG tablet Take 50 mg by mouth daily.   Yes [provider]  saxagliptin HCl (ONGLYZA) 2.5 MG TABS tablet Take 1 tablet (2.5 mg total) by mouth daily. 07/25/16  Yes Scot Jun, FNP  topiramate (TOPAMAX) 25 MG tablet Take 1 pill for 7 days. Take 1 pill in morning and 1 pill at bedtime for 7 days. Take 1 pill every morning and 2 pills at bedtime for 7 days. Then take 2 pills in morning and 2 pills at bedtime daily. 08/01/16  Yes Scot Jun, FNP  venlafaxine XR (EFFEXOR XR) 75 MG 24 hr capsule Take 1 capsule (75 mg total) by mouth daily with breakfast. 07/25/16  Yes Scot Jun, FNP  SUMAtriptan (IMITREX) 25 MG tablet Take 1 tablet (25 mg total) by mouth once. May repeat in 2 hours if headache persists or recurs. 04/24/16 04/24/16  Dorena Dew, FNP    Past Medical, Surgical Family and Social History reviewed and updated.    Objective:   Today's Vitals   08/08/16 0955 08/08/16 1026  BP: (!) 158/80 (!) 150/72  Pulse: (!) 109   Resp: 18   Temp: 97.9 F (36.6 C)   TempSrc: Oral   SpO2: 96%   Weight: 236 lb (107 kg)   Height: '5\' 7"'$  (1.702 m)     Wt Readings from Last 3 Encounters:  08/08/16 236 lb (107 kg)  07/25/16 234 lb (106.1 kg)  04/24/16 229 lb (103.9 kg)    Physical Exam  Constitutional: She is oriented to person, place, and time. She appears well-developed and well-nourished.  HENT:   Head: Normocephalic and atraumatic.  Neck: Normal range of motion. Neck supple. No thyromegaly present.  Cardiovascular: Normal rate, regular rhythm, normal heart sounds and intact distal pulses.   Pulmonary/Chest: Effort normal and breath sounds normal.  Musculoskeletal: Normal range of motion.  Neurological: She is alert and oriented to person, place, and time. She has normal reflexes.  Skin: Skin is warm and dry.  Psychiatric: She has a normal mood and affect. Her behavior is normal. Judgment and thought content normal.      Assessment & Plan:  1. Essential hypertension, not at goal, less than 140/90. Uncertain if medications are effective at current dose as patient has not taken medication today. -Continue Lisinopril and HCTZ as current doses.  Reinforced that blood pressure medication should be taken at the same time daily and prior to appointments in order for me to evaluate the effectiveness of therapy.  2. Nonintractable headache, unspecified chronicity pattern, unspecified headache type -For acute onset of an headache, take 100 mg Imitrex  -Continue Topamax as prescribed. Reinforced this medication is for prevention and will take some time too see  effectiveness of medication.  3. Anxiety -Continue Klonopin and Effexor  -Keep follow-up with [facility]  RTC: 08/22/2016 for Blood pressure recheck Keep 3 month follow-up  Carroll Sage. Kenton Kingfisher, MSN, [facility] [address] [place], [place] [address] [phone]

## 2016-08-09 MED FILL — ?LOVASTATIN 20MG TABLET: 20 | 30 days supply | Qty: 30 | Fill #7

## 2016-08-09 MED FILL — ?HYDROCHLOROTHIAZIDE 12.5MG: 12.5 | 30 days supply | Qty: 30 | Fill #4

## 2016-08-09 MED FILL — LISINOPRIL 20 MG TABLET: 20 | 30 days supply | Qty: 30 | Fill #7

## 2016-08-22 ENCOUNTER — Ambulatory Visit: Payer: Medicaid Other | Admitting: Family Medicine

## 2016-08-22 VITALS — BP 144/82

## 2016-08-22 DIAGNOSIS — Z0130 Encounter for examination of blood pressure without abnormal findings: Secondary | ICD-10-CM

## 2016-09-04 ENCOUNTER — Other Ambulatory Visit: Payer: Self-pay | Admitting: Family Medicine

## 2016-09-04 MED FILL — !LANTUS 100 UNITS/ML VIAL: 100 | 50 days supply | Qty: 10 | Fill #2

## 2016-09-04 MED FILL — ?HYDROCHLOROTHIAZIDE 12.5MG: 12.5 | 30 days supply | Qty: 30 | Fill #5

## 2016-09-04 MED FILL — METHOCARBAMOL 500 MG TABLET: 500 | 30 days supply | Qty: 120 | Fill #0

## 2016-09-04 MED FILL — GABAPENTIN 300 MG CAPSULE: 300 | 30 days supply | Qty: 30 | Fill #2

## 2016-09-04 MED FILL — TOPIRAMATE 25 MG TABLET: 25 | 30 days supply | Qty: 75 | Fill #0

## 2016-09-04 MED FILL — ONGLYZA 2.5 MG TABLET: 2.5 | 30 days supply | Qty: 30 | Fill #0

## 2016-10-09 ENCOUNTER — Other Ambulatory Visit: Payer: Self-pay | Admitting: *Deleted

## 2016-10-09 DIAGNOSIS — E119 Type 2 diabetes mellitus without complications: Secondary | ICD-10-CM

## 2016-10-09 MED ORDER — INSULIN GLARGINE 100 UNIT/ML ~~LOC~~ SOLN: 20.0000 [IU] | Freq: Every day | SUBCUTANEOUS | 3 refills | Status: DC

## 2016-10-09 NOTE — Telephone Encounter (Signed)
PRINTED FOR PASS PROGRAM 

## 2016-10-13 ENCOUNTER — Encounter (HOSPITAL_COMMUNITY): Payer: Self-pay | Admitting: Emergency Medicine

## 2016-10-13 ENCOUNTER — Ambulatory Visit (HOSPITAL_COMMUNITY)
Admission: EM | Admit: 2016-10-13 | Discharge: 2016-10-13 | Disposition: A | Discharge status: Home / Self Care | Payer: Medicaid Other | Attending: Internal Medicine | Admitting: Internal Medicine

## 2016-10-13 DIAGNOSIS — M542 Cervicalgia: Secondary | ICD-10-CM

## 2016-10-13 MED ORDER — IBUPROFEN 800 MG PO TABS: 800.0000 mg | ORAL_TABLET | Freq: Three times a day (TID) | ORAL | 0 refills | Status: AC | PRN

## 2016-10-13 MED ORDER — CYCLOBENZAPRINE HCL 10 MG PO TABS: 10.0000 mg | ORAL_TABLET | Freq: Three times a day (TID) | ORAL | 0 refills | Status: DC | PRN

## 2016-10-13 NOTE — ED Triage Notes (Signed)
Pt here for constant left shoulder/neck pain onset 10 days   Sts pain increases w/activity   Denies inj/trauma... Pt does not work  A&O x4... NAD.... ambulatory

## 2016-10-13 NOTE — ED Provider Notes (Signed)
CSN: [id number]     Arrival date & time 10/13/16  1251 History   First MD Initiated Contact with Patient 10/13/16 1432     Chief Complaint  Patient presents with  . Shoulder Pain   (Consider location/radiation/quality/duration/timing/severity/associated sxs/prior Treatment) Subjective:   Catherine Pope is a 51 y.o. female who presents for evaluation and treatment of neck pain. Onset of symptoms was 1 week ago, unchanged since that time. Current symptoms are pain in neck and left shoulder with radiation down the right arm. She describes it aching in character; 5/10 in severity. Patient denies any numbness, pain and weakness in the upper extremities. Event that precipitated these symptoms: none known. Patient reports previous diagnosis many years ago of degenerative disc disease.  Previous treatments include: none.  The following portions of the patient's history were reviewed and updated as appropriate: allergies, current medications, past family history, past medical history, past social history, past surgical history and problem list.         Past Medical History:  Diagnosis Date  . Diabetes mellitus   . Hypertension   . Migraine   . Sinusitis    Past Surgical History:  Procedure Laterality Date  . ABDOMINAL HYSTERECTOMY    . ANTERIOR CRUCIATE LIGAMENT REPAIR     Family History  Problem Relation Age of Onset  . Heart Problems Father    Social History  Substance Use Topics  . Smoking status: Current Every Day Smoker    Packs/day: 0.50    Types: Cigarettes  . Smokeless tobacco: Never Used  . Alcohol use No   OB History    No data available     Review of Systems  Musculoskeletal: Positive for neck pain. Negative for neck stiffness.  Neurological: Negative for numbness.  All other systems reviewed and are negative.   Allergies  Patient has no known allergies.  Home Medications   Prior to Admission medications   Medication Sig Start Date End Date Taking?  Authorizing Provider  clonazePAM (KLONOPIN) 0.5 MG tablet Take 1 tablet (0.5 mg total) by mouth 2 (two) times daily as needed for anxiety. 08/08/16  Yes Scot Jun, FNP  fish oil-omega-3 fatty acids 1000 MG capsule Take 2 capsules (2 g total) by mouth daily. 06/22/14  Yes Ernestina Patches, MD  glucose blood (TRUE METRIX BLOOD GLUCOSE TEST) test strip Use as instructed 03/23/16  Yes Dorena Dew, FNP  hydrochlorothiazide (HYDRODIURIL) 12.5 MG tablet Take 1 tablet (12.5 mg total) by mouth daily. 03/23/16  Yes Dorena Dew, FNP  insulin detemir (LEVEMIR) 100 UNIT/ML injection INJECT 20 UNITS INTO THE SKIN AT BEDTIME. ONE TIME ORDER UNTIL LANTUS GETS APPROVED VIA PASS 06/07/16  Yes Dorena Dew, FNP  insulin glargine (LANTUS) 100 UNIT/ML injection Inject 0.2 mLs (20 Units total) into the skin at bedtime. 10/09/16  Yes Jegede, Marlena Clipper, MD  Insulin Syringe-Needle U-100 (INSULIN SYRINGE 1CC/30GX1/2") 30G X 1/2" 1 ML MISC 1 Syringe by Does not apply route daily. 03/23/16  Yes Dorena Dew, FNP  Lancets [facility] ULTRASOFT) lancets Use as instructed 06/22/14  Yes Ernestina Patches, MD  lisinopril (PRINIVIL,ZESTRIL) 20 MG tablet Take 1 tablet (20 mg total) by mouth daily. 08/20/15  Yes Micheline Chapman, NP  lovastatin (MEVACOR) 20 MG tablet Take 1 tablet (20 mg total) by mouth at bedtime. 08/20/15  Yes Micheline Chapman, NP  metFORMIN (GLUCOPHAGE) 1000 MG tablet 500 mg with breakfast and 1000 mg at dinnertime 07/25/16  Yes Scot Jun, FNP  QUEtiapine (SEROQUEL) 50 MG tablet Take 100 mg by mouth daily.   Yes [provider]  saxagliptin HCl (ONGLYZA) 2.5 MG TABS tablet Take 1 tablet (2.5 mg total) by mouth daily. 07/25/16  Yes Scot Jun, FNP  SUMAtriptan (IMITREX) 50 MG tablet Take 1 tablet (50 mg total) by mouth every 2 (two) hours as needed for migraine. May repeat in 2 hours if headache persists or recurs. 08/08/16  Yes Scot Jun, FNP  topiramate (TOPAMAX)  25 MG tablet TAKE 1 TAB DAILY FOR 7DAYS,THEN 1 TAB IN THE A.M AND 1 TAB _0  X7DAYS,THEN 1 TAB EVERY A.M &2TABS _1  X 7DAYS, THEN 2TABS EACH A.M 09/04/16  Yes Scot Jun, FNP  venlafaxine XR (EFFEXOR XR) 75 MG 24 hr capsule Take 1 capsule (75 mg total) by mouth daily with breakfast. 07/25/16  Yes Scot Jun, FNP  Blood Glucose Monitoring Suppl (TRUE METRIX AIR GLUCOSE METER) w/Device KIT 1 each by Does not apply route 4 (four) times daily - after meals and at bedtime. 03/23/16   Dorena Dew, FNP  fluticasone (FLONASE) 50 MCG/ACT nasal spray Place 2 sprays into both nostrils daily. 06/22/14   Ernestina Patches, MD  ketoprofen (ORUDIS) 50 MG capsule Take 1 capsule (50 mg total) by mouth 4 (four) times daily as needed. 04/24/16   Dorena Dew, FNP  methocarbamol (ROBAXIN) 500 MG tablet TAKE 1 TABLET BY MOUTH 4 TIMES DAILY. 09/04/16   Scot Jun, FNP  SUMAtriptan (IMITREX) 25 MG tablet Take 1 tablet (25 mg total) by mouth once. May repeat in 2 hours if headache persists or recurs. 04/24/16 04/24/16  Dorena Dew, FNP   Meds Ordered and Administered this Visit  Medications - No data to display  BP (!) 152/76 (BP Location: Right Arm)   Pulse 96   Temp 98.2 F (36.8 C) (Oral)   Resp 20   SpO2 100%  No data found.   Physical Exam  Constitutional: She is oriented to person, place, and time. She appears well-developed and well-nourished.  HENT:  Head: Normocephalic.  Neck: Normal range of motion. Neck supple.  Cardiovascular: Normal rate and regular rhythm.   Pulmonary/Chest: Effort normal and breath sounds normal.  Musculoskeletal: Normal range of motion.  Neurological: She is alert and oriented to person, place, and time. No cranial nerve deficit.  Skin: Skin is warm and dry.  Psychiatric: She has a normal mood and affect.    Urgent Care Course     Procedures (including critical care time)  Labs Review Labs Reviewed - No data to display  Imaging  Review No results found.   Visual Acuity Review  Right Eye Distance:   Left Eye Distance:   Bilateral Distance:    Right Eye Near:   Left Eye Near:    Bilateral Near:         MDM   1. Cervicalgia     Discussed the cervical pain, its course and treatment. Discussed appropriate use of ice and heat. Muscle relaxants started per medication orders. Follow up with PCP next week as already scheduled. May need imaging and/or PT referral if symptoms have not improved   Discussed diagnosis and treatment with patient. All questions have been answered and all concerns have been addressed. The patient verbalized understanding and had no further questions     Enrique Sack, [place] 10/13/16 1443

## 2016-10-16 ENCOUNTER — Other Ambulatory Visit: Payer: Self-pay | Admitting: Family Medicine

## 2016-10-16 DIAGNOSIS — G629 Polyneuropathy, unspecified: Secondary | ICD-10-CM

## 2016-10-16 MED ORDER — TOPIRAMATE 50 MG PO TABS: 50.0000 mg | ORAL_TABLET | Freq: Two times a day (BID) | ORAL | 3 refills | Status: DC

## 2016-10-16 MED FILL — GABAPENTIN 300 MG CAPSULE: 300 | 30 days supply | Qty: 30 | Fill #0

## 2016-10-16 MED FILL — METHOCARBAMOL 500 MG TABLET: 500 | 30 days supply | Qty: 120 | Fill #0

## 2016-10-16 NOTE — Telephone Encounter (Signed)
Already sent in a corrected prescription

## 2016-10-16 NOTE — Telephone Encounter (Signed)
Corrected sig per request

## 2016-10-16 NOTE — Progress Notes (Signed)
E-prescribed Topamax 50 mg twice daily for headache prevention per request receive from pharmacy

## 2016-10-17 ENCOUNTER — Ambulatory Visit (HOSPITAL_COMMUNITY)
Admission: RE | Admit: 2016-10-17 | Discharge: 2016-10-17 | Disposition: A | Discharge status: Home / Self Care | Payer: Medicaid Other | Source: Ambulatory Visit | Attending: Family Medicine | Admitting: Family Medicine

## 2016-10-17 ENCOUNTER — Ambulatory Visit (INDEPENDENT_AMBULATORY_CARE_PROVIDER_SITE_OTHER): Payer: Medicaid Other | Admitting: Family Medicine

## 2016-10-17 ENCOUNTER — Encounter: Payer: Self-pay | Admitting: Family Medicine

## 2016-10-17 ENCOUNTER — Other Ambulatory Visit: Payer: Self-pay | Admitting: Family Medicine

## 2016-10-17 VITALS — BP 142/80 | HR 92 | Temp 97.8°F | Resp 16 | Ht 67.0 in | Wt 239.0 lb

## 2016-10-17 DIAGNOSIS — M8938 Hypertrophy of bone, other site: Secondary | ICD-10-CM | POA: Insufficient documentation

## 2016-10-17 DIAGNOSIS — M5412 Radiculopathy, cervical region: Secondary | ICD-10-CM | POA: Diagnosis not present

## 2016-10-17 DIAGNOSIS — M76891 Other specified enthesopathies of right lower limb, excluding foot: Secondary | ICD-10-CM | POA: Diagnosis not present

## 2016-10-17 DIAGNOSIS — M25552 Pain in left hip: Secondary | ICD-10-CM | POA: Insufficient documentation

## 2016-10-17 DIAGNOSIS — E119 Type 2 diabetes mellitus without complications: Secondary | ICD-10-CM

## 2016-10-17 DIAGNOSIS — I10 Essential (primary) hypertension: Secondary | ICD-10-CM

## 2016-10-17 DIAGNOSIS — M2578 Osteophyte, vertebrae: Secondary | ICD-10-CM | POA: Diagnosis not present

## 2016-10-17 DIAGNOSIS — M4802 Spinal stenosis, cervical region: Secondary | ICD-10-CM | POA: Diagnosis not present

## 2016-10-17 DIAGNOSIS — M5442 Lumbago with sciatica, left side: Secondary | ICD-10-CM | POA: Insufficient documentation

## 2016-10-17 DIAGNOSIS — G8929 Other chronic pain: Secondary | ICD-10-CM | POA: Insufficient documentation

## 2016-10-17 DIAGNOSIS — E785 Hyperlipidemia, unspecified: Secondary | ICD-10-CM

## 2016-10-17 DIAGNOSIS — M50323 Other cervical disc degeneration at C6-C7 level: Secondary | ICD-10-CM | POA: Insufficient documentation

## 2016-10-17 DIAGNOSIS — M438X2 Other specified deforming dorsopathies, cervical region: Secondary | ICD-10-CM | POA: Diagnosis not present

## 2016-10-17 DIAGNOSIS — M5030 Other cervical disc degeneration, unspecified cervical region: Secondary | ICD-10-CM | POA: Diagnosis not present

## 2016-10-17 DIAGNOSIS — G629 Polyneuropathy, unspecified: Secondary | ICD-10-CM | POA: Diagnosis not present

## 2016-10-17 LAB — CBC WITH DIFFERENTIAL/PLATELET
Basophils Absolute: 0 cells/uL (ref 0–200)
Basophils Relative: 0 %
Eosinophils Absolute: 192 cells/uL (ref 15–500)
Eosinophils Relative: 2 %
HCT: 40.8 % (ref 35.0–45.0)
Hemoglobin: 13.3 g/dL (ref 11.7–15.5)
Lymphocytes Relative: 33 %
Lymphs Abs: 3168 cells/uL (ref 850–3900)
MCH: 29.7 pg (ref 27.0–33.0)
MCHC: 32.6 g/dL (ref 32.0–36.0)
MCV: 91.1 fL (ref 80.0–100.0)
MPV: 11.4 fL (ref 7.5–12.5)
Monocytes Absolute: 480 cells/uL (ref 200–950)
Monocytes Relative: 5 %
Neutro Abs: 5760 cells/uL (ref 1500–7800)
Neutrophils Relative %: 60 %
Platelets: 259 10*3/uL (ref 140–400)
RBC: 4.48 MIL/uL (ref 3.80–5.10)
RDW: 14.1 % (ref 11.0–15.0)
WBC: 9.6 10*3/uL (ref 3.8–10.8)

## 2016-10-17 LAB — BASIC METABOLIC PANEL
BUN: 10 mg/dL (ref 7–25)
CO2: 21 mmol/L (ref 20–31)
Calcium: 8.8 mg/dL (ref 8.6–10.2)
Chloride: 101 mmol/L (ref 98–110)
Creat: 0.8 mg/dL (ref 0.50–1.10)
Glucose, Bld: 363 mg/dL — ABNORMAL HIGH (ref 65–99)
Potassium: 4.1 mmol/L (ref 3.5–5.3)
Sodium: 133 mmol/L — ABNORMAL LOW (ref 135–146)

## 2016-10-17 LAB — POCT GLYCOSYLATED HEMOGLOBIN (HGB A1C): Hemoglobin A1C: 13.8

## 2016-10-17 MED ORDER — INSULIN DETEMIR 100 UNIT/ML ~~LOC~~ SOLN: SUBCUTANEOUS | 3 refills | Status: DC

## 2016-10-17 MED ORDER — GABAPENTIN 300 MG PO CAPS: 300.0000 mg | ORAL_CAPSULE | Freq: Every day | ORAL | 1 refills | Status: DC

## 2016-10-17 MED ORDER — LOVASTATIN 20 MG PO TABS: 20.0000 mg | ORAL_TABLET | Freq: Every day | ORAL | 3 refills | Status: DC

## 2016-10-17 MED ORDER — HYDROCHLOROTHIAZIDE 12.5 MG PO TABS: 12.5000 mg | ORAL_TABLET | Freq: Every day | ORAL | 3 refills | Status: DC

## 2016-10-17 MED ORDER — VENLAFAXINE HCL ER 75 MG PO CP24: 150.0000 mg | ORAL_CAPSULE | Freq: Every day | ORAL | 3 refills | Status: DC

## 2016-10-17 MED ORDER — KETOROLAC TROMETHAMINE 60 MG/2ML IM SOLN
60.0000 mg | Freq: Once | INTRAMUSCULAR | Status: AC
  Administered 2016-10-17: 60 mg via INTRAMUSCULAR

## 2016-10-17 MED ORDER — VENLAFAXINE HCL ER 75 MG PO CP24: 75.0000 mg | ORAL_CAPSULE | Freq: Every day | ORAL | 3 refills | Status: DC

## 2016-10-17 MED ORDER — CYCLOBENZAPRINE HCL 10 MG PO TABS: 10.0000 mg | ORAL_TABLET | Freq: Three times a day (TID) | ORAL | 0 refills | Status: DC | PRN

## 2016-10-17 MED ORDER — SAXAGLIPTIN HCL 2.5 MG PO TABS: 5.0000 mg | ORAL_TABLET | Freq: Every day | ORAL | 3 refills | Status: DC

## 2016-10-17 MED ORDER — SAXAGLIPTIN HCL 2.5 MG PO TABS: 2.5000 mg | ORAL_TABLET | Freq: Every day | ORAL | 3 refills | Status: DC

## 2016-10-17 MED ORDER — SUMATRIPTAN SUCCINATE 50 MG PO TABS: 50.0000 mg | ORAL_TABLET | ORAL | 2 refills | Status: DC | PRN

## 2016-10-17 MED ORDER — IPRATROPIUM BROMIDE 0.03 % NA SOLN: 2.0000 | Freq: Two times a day (BID) | NASAL | 0 refills | Status: DC

## 2016-10-17 MED ORDER — LISINOPRIL 20 MG PO TABS: 20.0000 mg | ORAL_TABLET | Freq: Every day | ORAL | 3 refills | Status: DC

## 2016-10-17 MED FILL — LEVEMIR 100 UNITS/ML VIAL: 100 | 30 days supply | Qty: 10 | Fill #0

## 2016-10-17 MED FILL — VENLAFAXINE HCL ER 75 MG CA: 75 | 30 days supply | Qty: 60 | Fill #0

## 2016-10-17 MED FILL — LOVASTATIN 20 MG TABLET: 20 | 30 days supply | Qty: 30 | Fill #0

## 2016-10-17 MED FILL — HYDROCHLOROTHIAZIDE 12.5 MG: 12.5 | 30 days supply | Qty: 30 | Fill #0

## 2016-10-17 MED FILL — LISINOPRIL 20 MG TAB: 20 | 30 days supply | Qty: 30 | Fill #0

## 2016-10-17 MED FILL — TOPIRAMATE 50 MG TABLET: 50 | 30 days supply | Qty: 60 | Fill #0

## 2016-10-17 MED FILL — SUMATRIPTAN SUCC 50 MG TAB: 50 | 30 days supply | Qty: 9 | Fill #0

## 2016-10-17 MED FILL — IPRATROPIUM 0.03% SPRAY: 0.03 | 30 days supply | Qty: 30 | Fill #0

## 2016-10-17 NOTE — Progress Notes (Signed)
Patient ID: Jillana Selph, female    DOB: 02-Aug-1965, 51 y.o.   MRN: [id number]  PCP: Scot Jun, FNP  Chief Complaint  Patient presents with  . Follow-up    3 MONTH  . Hospitalization Follow-up    Shoulder Pain, neck pain   Subjective:  HPI Ryhanna Dunsmore is a 51 y.o. female presents for evaluation of  neck and shoulder pain, hypertension, and diabetes.   Neck and Shoulder pain Desira was seen at [facility] urgent care for neck and shoulder pain on 10/13/2016. At that point her neck and been hurting for a period of one week and she attempted relief with Tylenol and over-the-counter ibuprofen without significant relief. At urgent care she was prescribed cyclobenzaprine 10 mg 3 times daily and ibuprofen 800 mg every 8 hours as needed for pain. She denies any known injuries however reports a distant history of being number of having degenerative disc disease in 2012. However did not have insurance coverage at that time and so therefore never followed up with the specialist. No imaging was performed at urgent care. She reports mild relief of symptoms. Denies any limitation with range of motion.   Diabetes  Latrease report actively trying to drink more water. She continues to engage occasionally in sugary foods and beverages when she stressed. Katana is inconsistent with monitoring her blood glucose and reports when she monitors it it is sometimes greater than 300. Admits to inconsistent administration of insulin. Current Body mass index is 37.43 kg/m. With a weight gain of approximately 2-3 pounds since last visit. She has not started a exercise regimen.  Hypertension Megean Fabio reports no home monitoring of blood pressure. Reports adherence to blood pressure medications although she ran out of hydrochlorothiazide of 3 weeks ago and has not called in a refill.  she . Reports nonadherence to low sodium diet. She is a smoker and reports cutting back to less than a half a pack per day.  Denies any episodes of dizziness, headaches, shortness of breath, or chest pain. She engages in no routine physical exercise.  Social History   Social History  . Marital status: Single    Spouse name: N/A  . Number of children: 2  . Years of education: 66 th   Occupational History  . unemployed    Social History Main Topics  . Smoking status: Current Every Day Smoker    Packs/day: 0.50    Types: Cigarettes  . Smokeless tobacco: Never Used  . Alcohol use No  . Drug use: No  . Sexual activity: Not on file   Other Topics Concern  . Not on file   Social History Narrative   Patient lives at home with her daughters and she is unemployed. Patient has two children.   Patient has a high school education.   Right handed.    Caffeine two cups daily.    Family History  Problem Relation Age of Onset  . Heart Problems Father    Review of Systems See history of present illness  Patient Active Problem List   Diagnosis Date Noted  . Essential hypertension 03/23/2016  . Laryngitis, acute 03/23/2016  . Neuropathy 03/23/2016  . Tobacco dependence 03/23/2016  . Diabetes mellitus ([place]) 11/26/2013  . Morbid obesity ([place]) 11/26/2013  . Migraine without aura 11/26/2013  . Other and unspecified hyperlipidemia 11/26/2013   No Known Allergies  Prior to Admission medications   Medication Sig Start Date End Date Taking? Authorizing Provider  Blood Glucose Monitoring  Suppl (TRUE METRIX AIR GLUCOSE METER) w/Device KIT 1 each by Does not apply route 4 (four) times daily - after meals and at bedtime. 03/23/16   Massie Maroon, FNP  clonazePAM (KLONOPIN) 0.5 MG tablet Take 1 tablet (0.5 mg total) by mouth 2 (two) times daily as needed for anxiety. 08/08/16   Bing Neighbors, FNP  cyclobenzaprine (FLEXERIL) 10 MG tablet Take 1 tablet (10 mg total) by mouth 3 (three) times daily as needed (neck pain). 10/13/16 10/20/16  Lurline Idol, FNP  fish oil-omega-3 fatty acids 1000 MG capsule Take 2  capsules (2 g total) by mouth daily. 06/22/14   Toy Cookey, MD  gabapentin (NEURONTIN) 300 MG capsule TAKE 1 CAPSULE BY MOUTH AT BEDTIME. 10/16/16   Bing Neighbors, FNP  glucose blood (TRUE METRIX BLOOD GLUCOSE TEST) test strip Use as instructed 03/23/16   Massie Maroon, FNP  hydrochlorothiazide (HYDRODIURIL) 12.5 MG tablet Take 1 tablet (12.5 mg total) by mouth daily. 03/23/16   Massie Maroon, FNP  ibuprofen (ADVIL,MOTRIN) 800 MG tablet Take 1 tablet (800 mg total) by mouth every 8 (eight) hours as needed (pain). 10/13/16 10/20/16  Lurline Idol, FNP  insulin detemir (LEVEMIR) 100 UNIT/ML injection INJECT 20 UNITS INTO THE SKIN AT BEDTIME. ONE TIME ORDER UNTIL LANTUS GETS APPROVED VIA PASS 06/07/16   Massie Maroon, FNP  insulin glargine (LANTUS) 100 UNIT/ML injection Inject 0.2 mLs (20 Units total) into the skin at bedtime. 10/09/16   Quentin Angst, MD  Insulin Syringe-Needle U-100 (INSULIN SYRINGE 1CC/30GX1/2") 30G X 1/2" 1 ML MISC 1 Syringe by Does not apply route daily. 03/23/16   Massie Maroon, FNP  ketoprofen (ORUDIS) 50 MG capsule Take 1 capsule (50 mg total) by mouth 4 (four) times daily as needed. 04/24/16   Massie Maroon, FNP  Lancets Letta Pate ULTRASOFT) lancets Use as instructed 06/22/14   Toy Cookey, MD  lisinopril (PRINIVIL,ZESTRIL) 20 MG tablet Take 1 tablet (20 mg total) by mouth daily. 08/20/15   Henrietta Hoover, NP  lovastatin (MEVACOR) 20 MG tablet Take 1 tablet (20 mg total) by mouth at bedtime. 08/20/15   Henrietta Hoover, NP  metFORMIN (GLUCOPHAGE) 1000 MG tablet 500 mg with breakfast and 1000 mg at dinnertime 07/25/16   Bing Neighbors, FNP  methocarbamol (ROBAXIN) 500 MG tablet TAKE 1 TABLET BY MOUTH 4 TIMES DAILY. 10/16/16   Bing Neighbors, FNP  QUEtiapine (SEROQUEL) 50 MG tablet Take 100 mg by mouth daily.    [provider]  saxagliptin HCl (ONGLYZA) 2.5 MG TABS tablet Take 1 tablet (2.5 mg total) by mouth daily. 07/25/16    Bing Neighbors, FNP  SUMAtriptan (IMITREX) 25 MG tablet Take 1 tablet (25 mg total) by mouth once. May repeat in 2 hours if headache persists or recurs. 04/24/16 04/24/16  Massie Maroon, FNP  SUMAtriptan (IMITREX) 50 MG tablet Take 1 tablet (50 mg total) by mouth every 2 (two) hours as needed for migraine. May repeat in 2 hours if headache persists or recurs. 08/08/16   Bing Neighbors, FNP  topiramate (TOPAMAX) 50 MG tablet Take 1 tablet (50 mg total) by mouth 2 (two) times daily. 10/16/16   Bing Neighbors, FNP  topiramate (TOPAMAX) 50 MG tablet Take 1 tablet (50 mg total) by mouth 2 (two) times daily. 10/16/16 10/16/17  Bing Neighbors, FNP  venlafaxine XR (EFFEXOR XR) 75 MG 24 hr capsule Take 1 capsule (75 mg total) by mouth daily with breakfast.  07/25/16   Scot Jun, FNP    Past Medical, Surgical Family and Social History reviewed and updated.    Objective:   Vitals:   10/17/16 1034  BP: (!) 142/80  Pulse: 92  Resp: 16  Temp: 97.8 F (36.6 C)     Wt Readings from Last 3 Encounters:  08/08/16 236 lb (107 kg)  07/25/16 234 lb (106.1 kg)  04/24/16 229 lb (103.9 kg)   Physical Exam  Constitutional: She is oriented to person, place, and time. She appears well-developed and well-nourished.  HENT:  Head: Normocephalic and atraumatic.  Eyes: Pupils are equal, round, and reactive to light. Conjunctivae and EOM are normal.  Neck: Normal range of motion. Neck supple. No thyromegaly present.  Cardiovascular: Normal rate, regular rhythm, normal heart sounds and intact distal pulses.   Pulmonary/Chest: Effort normal and breath sounds normal.  Abdominal: Soft. Bowel sounds are normal.  Musculoskeletal: Normal range of motion. She exhibits no edema.  Neck pain produced with forward flexion, hyperextension, and rotational movements. No tenderness or pain reproducible with shoulder range of motion.   Neurological: She is alert and oriented to person, place, and time. She  has normal reflexes.  Psychiatric: Her behavior is normal. Judgment and thought content normal. Her mood appears anxious.   Assessment & Plan:  1. Type 2 diabetes mellitus without complication, without long-term current use of insulin ([place]), uncontrolled - POCT glycosylated hemoglobin (Hb A1C)- 13.5  2. Essential hypertension, stable but uncontrolled today -Resume hydrochlorothiazide (HYDRODIURIL) 12.5 MG table - Lisinopril (PRINIVIL,ZESTRIL) 20 MG table  3. Dyslipidemia - lovastatin (MEVACOR) 20 MG tablet; Take 1 tablet (20 mg total) by mouth at bedtime.    4. Neuropathy - gabapentin (NEURONTIN) 300 MG capsule; Take 1 capsule (300 mg total) by mouth at bedtime.    5. Cervical radiculopathy - DG Cervical Spine Complete; Future - ketorolac (TORADOL) injection 60 mg; Inject 2 mLs (60 mg total) into the muscle once. -Ambulatory referral to neurosurgery  6. Degenerative disc disease, cervical -Ambulatory referral to neurosurgery  Orders placed this encounter: -CBC with Differential; Future - Basic metabolic panel  Patient had multiple prior x-rays pending for hip and lumbar spine from prior visit that were never performed therefore imaging was performed bilateral hip and lumbar spine.   Carroll Sage. Kenton Kingfisher, MSN, FNP-C [facility]  [address] [place], [place] [address] [phone]

## 2016-10-17 NOTE — Patient Instructions (Addendum)
Return in 4 weeks for glucose check, fast two hours. Increasing Levemir from 20 units bedtimes 40 units to bedtime. Increasing Onglyza from 2.5 mg to 5 mg.  I have increased your Venlafaxine 150 mg once daily.   Diabetes Mellitus and Food It is important for you to manage your blood sugar (glucose) level. Your blood glucose level can be greatly affected by what you eat. Eating healthier foods in the appropriate amounts throughout the day at about the same time each day will help you control your blood glucose level. It can also help slow or prevent worsening of your diabetes mellitus. Healthy eating may even help you improve the level of your blood pressure and reach or maintain a healthy weight. General recommendations for healthful eating and cooking habits include:  Eating meals and snacks regularly. Avoid going long periods of time without eating to lose weight.  Eating a diet that consists mainly of plant-based foods, such as fruits, vegetables, nuts, legumes, and whole grains.  Using low-heat cooking methods, such as baking, instead of high-heat cooking methods, such as deep frying.  Work with your dietitian to make sure you understand how to use the Nutrition Facts information on food labels. How can food affect me? Carbohydrates Carbohydrates affect your blood glucose level more than any other type of food. Your dietitian will help you determine how many carbohydrates to eat at each meal and teach you how to count carbohydrates. Counting carbohydrates is important to keep your blood glucose at a healthy level, especially if you are using insulin or taking certain medicines for diabetes mellitus. Alcohol Alcohol can cause sudden decreases in blood glucose (hypoglycemia), especially if you use insulin or take certain medicines for diabetes mellitus. Hypoglycemia can be a life-threatening condition. Symptoms of hypoglycemia (sleepiness, dizziness, and disorientation) are similar to symptoms  of having too much alcohol. If your health care provider has given you approval to drink alcohol, do so in moderation and use the following guidelines:  Women should not have more than one drink per day, and men should not have more than two drinks per day. One drink is equal to: ? 12 oz of beer. ? 5 oz of wine. ? 1 oz of hard liquor.  Do not drink on an empty stomach.  Keep yourself hydrated. Have water, diet soda, or unsweetened iced tea.  Regular soda, juice, and other mixers might contain a lot of carbohydrates and should be counted.  What foods are not recommended? As you make food choices, it is important to remember that all foods are not the same. Some foods have fewer nutrients per serving than other foods, even though they might have the same number of calories or carbohydrates. It is difficult to get your body what it needs when you eat foods with fewer nutrients. Examples of foods that you should avoid that are high in calories and carbohydrates but low in nutrients include:  Trans fats (most processed foods list trans fats on the Nutrition Facts label).  Regular soda.  Juice.  Candy.  Sweets, such as cake, pie, doughnuts, and cookies.  Fried foods.  What foods can I eat? Eat nutrient-rich foods, which will nourish your body and keep you healthy. The food you should eat also will depend on several factors, including:  The calories you need.  The medicines you take.  Your weight.  Your blood glucose level.  Your blood pressure level.  Your cholesterol level.  You should eat a variety of foods, including:  Protein. ? Lean cuts of meat. ? Proteins low in saturated fats, such as fish, egg whites, and beans. Avoid processed meats.  Fruits and vegetables. ? Fruits and vegetables that may help control blood glucose levels, such as apples, mangoes, and yams.  Dairy products. ? Choose fat-free or low-fat dairy products, such as milk, yogurt, and  cheese.  Grains, bread, pasta, and rice. ? Choose whole grain products, such as multigrain bread, whole oats, and brown rice. These foods may help control blood pressure.  Fats. ? Foods containing healthful fats, such as nuts, avocado, olive oil, canola oil, and fish.  Does everyone with diabetes mellitus have the same meal plan? Because every person with diabetes mellitus is different, there is not one meal plan that works for everyone. It is very important that you meet with a dietitian who will help you create a meal plan that is just right for you. This information is not intended to replace advice given to you by your health care provider. Make sure you discuss any questions you have with your health care provider. Document Released: 12/15/2004 Document Revised: 08/26/2015 Document Reviewed: 02/14/2013 Elsevier Interactive Patient Education  2017 [employer].

## 2016-10-18 ENCOUNTER — Telehealth: Payer: Self-pay

## 2016-10-18 ENCOUNTER — Other Ambulatory Visit: Payer: Self-pay | Admitting: Family Medicine

## 2016-10-18 DIAGNOSIS — E119 Type 2 diabetes mellitus without complications: Secondary | ICD-10-CM

## 2016-10-18 DIAGNOSIS — M5030 Other cervical disc degeneration, unspecified cervical region: Secondary | ICD-10-CM

## 2016-10-18 MED ORDER — METFORMIN HCL 1000 MG PO TABS: ORAL_TABLET | ORAL | 3 refills | Status: DC

## 2016-10-18 NOTE — Progress Notes (Signed)
Referral for neurosurgery, Dr. Ellene Route.

## 2016-10-18 NOTE — Telephone Encounter (Signed)
Metformin sent to pharmacy and patient notified

## 2016-10-20 ENCOUNTER — Telehealth: Payer: Self-pay

## 2016-10-20 ENCOUNTER — Other Ambulatory Visit: Payer: Self-pay | Admitting: Family Medicine

## 2016-10-20 DIAGNOSIS — E119 Type 2 diabetes mellitus without complications: Secondary | ICD-10-CM

## 2016-10-20 MED ORDER — METFORMIN HCL 1000 MG PO TABS: ORAL_TABLET | ORAL | 3 refills | Status: DC

## 2016-10-20 NOTE — Telephone Encounter (Signed)
Please contact patient to advise [facility]

## 2016-10-20 NOTE — Progress Notes (Signed)
[facility]

## 2016-10-23 ENCOUNTER — Telehealth: Payer: Self-pay

## 2016-10-23 MED ORDER — LINAGLIPTIN 5 MG PO TABS: 5.0000 mg | ORAL_TABLET | Freq: Every day | ORAL | 3 refills | Status: DC

## 2016-10-23 MED ORDER — LINAGLIPTIN 5 MG PO TABS
5.0000 mg | ORAL_TABLET | Freq: Every day | ORAL | 3 refills | Status: DC
Start: 2016-10-23 — End: 2017-03-21

## 2016-10-23 MED FILL — TRADJENTA 5 MG TABLET: 5 | 30 days supply | Qty: 30 | Fill #0

## 2016-10-23 MED FILL — metFORMIN HCL 500 MG TABS: 500 | 30 days supply | Qty: 90 | Fill #0

## 2016-10-23 NOTE — Telephone Encounter (Signed)
Onglyza was denied patient has to try and fail two of the following: Janumet ,Janumet XR,Jentadueto,Tradjenta

## 2016-10-23 NOTE — Telephone Encounter (Signed)
Patient notified that script is ready

## 2016-10-23 NOTE — Addendum Note (Signed)
Addended by: Jefferson Fuel on: 10/23/2016 02:34 PM   Modules accepted: Orders

## 2016-10-23 NOTE — Telephone Encounter (Signed)
I have prescribed tradjenta once daily and discontinued onglyza.

## 2016-10-23 NOTE — Telephone Encounter (Signed)
Left a vm for patient to callback 

## 2016-11-13 ENCOUNTER — Other Ambulatory Visit: Payer: Self-pay | Admitting: Neurological Surgery

## 2016-11-13 DIAGNOSIS — M542 Cervicalgia: Secondary | ICD-10-CM

## 2016-11-14 ENCOUNTER — Ambulatory Visit: Payer: Medicaid Other | Admitting: Family Medicine

## 2016-11-14 ENCOUNTER — Ambulatory Visit (HOSPITAL_COMMUNITY)
Admission: RE | Admit: 2016-11-14 | Discharge: 2016-11-14 | Disposition: A | Discharge status: Home / Self Care | Payer: Medicaid Other | Source: Ambulatory Visit | Attending: Family Medicine | Admitting: Family Medicine

## 2016-11-14 DIAGNOSIS — R739 Hyperglycemia, unspecified: Secondary | ICD-10-CM | POA: Diagnosis present

## 2016-11-14 LAB — GLUCOSE, CAPILLARY
Glucose-Capillary: 542 mg/dL (ref 65–99)
Glucose-Capillary: 459 mg/dL — ABNORMAL HIGH (ref 65–99)
Glucose-Capillary: 295 mg/dL — ABNORMAL HIGH (ref 65–99)
Glucose-Capillary: 375 mg/dL — ABNORMAL HIGH (ref 65–99)

## 2016-11-14 MED ORDER — SODIUM CHLORIDE 0.9 % IV SOLN: INTRAVENOUS | Status: DC

## 2016-11-14 MED ORDER — INJECTION DEVICE FOR INSULIN DEVI
Freq: Once | Status: AC
  Administered 2016-11-14: 10:00:00

## 2016-11-14 MED ORDER — SODIUM CHLORIDE 0.9 % IV SOLN
INTRAVENOUS | Status: DC
  Administered 2016-11-14: 10:00:00 via INTRAVENOUS

## 2016-11-14 MED ORDER — SODIUM CHLORIDE 0.9 % IV SOLN
INTRAVENOUS | Status: DC
  Administered 2016-11-14: 13:00:00 via INTRAVENOUS

## 2016-11-14 MED ORDER — INSULIN ASPART 100 UNIT/ML ~~LOC~~ SOLN
10.0000 [IU] | SUBCUTANEOUS | Status: AC
  Administered 2016-11-14: 10 [IU] via SUBCUTANEOUS
  Filled 2016-11-14 (×3): qty 0.1

## 2016-11-14 MED ORDER — HYDROXYZINE HCL 50 MG PO TABS
50.0000 mg | ORAL_TABLET | ORAL | Status: AC
  Administered 2016-11-14: 50 mg via ORAL
  Filled 2016-11-14: qty 1

## 2016-11-14 MED ORDER — INJECTION DEVICE FOR INSULIN DEVI: Freq: Once | Status: DC

## 2016-11-14 MED ORDER — SODIUM CHLORIDE 0.9 % IV SOLN
INTRAVENOUS | Status: AC
Start: 2016-11-14 — End: 2016-11-14

## 2016-11-14 NOTE — Progress Notes (Signed)
Diagnosis Association: Hyperglycemia (R73.9)  Provider: Claudine Mouton, NP  Procedure: Pt received IV fluids of normal saline over 2 hours and had her blood sugar checked over the course of her infusion.  Pt tolerated well.  Post procedure: Pt was alert, oriented, and ambulatory. Her vital signs were stable. D/C instructions given with verbal understanding.

## 2016-11-14 NOTE — Discharge Instructions (Signed)
Patient received 0.9 % sodium chloride infusion on today 8/14.

## 2016-11-14 NOTE — Progress Notes (Signed)
Patient ID: Catherine Pope, female    DOB: March 26, 1966, 51 y.o.   MRN: [id number]  PCP: Scot Jun, FNP   Subjective:  HPI Catherine Pope is a 51 y.o. female presented today for a nurses visit to evaluate glucose only.  Catherine Pope suffers from uncontrolled type 2 diabetes as well as medication noncompliance. Blood glucose checked today in office and was found to be 542. She reports recently eating and had not administered daily insulin. Catherine Pope further reported today that she hasn't recently monitored blood sugar and has be nonadherent to her prescribed diabetes medication regimen. She is being transferred to the day infusion center for extended observation, fluid rehydration, and blood sugar management.  Social History   Social History  . Marital status: Single    Spouse name: N/A  . Number of children: 2  . Years of education: 2 th   Occupational History  . unemployed    Social History Main Topics  . Smoking status: Current Every Day Smoker    Packs/day: 0.50    Types: Cigarettes  . Smokeless tobacco: Never Used  . Alcohol use No  . Drug use: No  . Sexual activity: Not on file   Other Topics Concern  . Not on file   Social History Narrative   Patient lives at home with her daughters and she is unemployed. Patient has two children.   Patient has a high school education.   Right handed.    Caffeine two cups daily.    Family History  Problem Relation Age of Onset  . Heart Problems Father    Review of Systems See HPI  Patient Active Problem List   Diagnosis Date Noted  . Essential hypertension 03/23/2016  . Laryngitis, acute 03/23/2016  . Neuropathy 03/23/2016  . Tobacco dependence 03/23/2016  . Diabetes mellitus ([place]) 11/26/2013  . Morbid obesity ([place]) 11/26/2013  . Migraine without aura 11/26/2013  . Other and unspecified hyperlipidemia 11/26/2013    No Known Allergies  Prior to Admission medications   Medication Sig Start Date End Date Taking?  Authorizing Provider  Blood Glucose Monitoring Suppl (TRUE METRIX AIR GLUCOSE METER) w/Device KIT 1 each by Does not apply route 4 (four) times daily - after meals and at bedtime. 03/23/16   Dorena Dew, FNP  clonazePAM (KLONOPIN) 0.5 MG tablet Take 1 tablet (0.5 mg total) by mouth 2 (two) times daily as needed for anxiety. 08/08/16   Scot Jun, FNP  fish oil-omega-3 fatty acids 1000 MG capsule Take 2 capsules (2 g total) by mouth daily. 06/22/14   Ernestina Patches, MD  gabapentin (NEURONTIN) 300 MG capsule Take 1 capsule (300 mg total) by mouth at bedtime. 10/17/16   Scot Jun, FNP  glucose blood (TRUE METRIX BLOOD GLUCOSE TEST) test strip Use as instructed 03/23/16   Dorena Dew, FNP  hydrochlorothiazide (HYDRODIURIL) 12.5 MG tablet Take 1 tablet (12.5 mg total) by mouth daily. 10/17/16   Scot Jun, FNP  insulin detemir (LEVEMIR) 100 UNIT/ML injection INJECT 40 UNITS INTO THE SKIN AT BEDTIME. ONE TIME ORDER UNTIL LANTUS GETS APPROVED VIA PASS 10/17/16   Scot Jun, FNP  insulin glargine (LANTUS) 100 UNIT/ML injection Inject 0.2 mLs (20 Units total) into the skin at bedtime. 10/09/16   Tresa Garter, MD  Insulin Syringe-Needle U-100 (INSULIN SYRINGE 1CC/30GX1/2") 30G X 1/2" 1 ML MISC 1 Syringe by Does not apply route daily. 03/23/16   Dorena Dew, FNP  ipratropium (ATROVENT) 0.03 % nasal  spray Place 2 sprays into both nostrils 2 (two) times daily. 10/17/16   Scot Jun, FNP  ketoprofen (ORUDIS) 50 MG capsule Take 1 capsule (50 mg total) by mouth 4 (four) times daily as needed. 04/24/16   Dorena Dew, FNP  Lancets Glory Rosebush ULTRASOFT) lancets Use as instructed 06/22/14   Ernestina Patches, MD  linagliptin (TRADJENTA) 5 MG TABS tablet Take 1 tablet (5 mg total) by mouth daily. 10/23/16   Scot Jun, FNP  lisinopril (PRINIVIL,ZESTRIL) 20 MG tablet Take 1 tablet (20 mg total) by mouth daily. 10/17/16   Scot Jun, FNP  lovastatin  (MEVACOR) 20 MG tablet Take 1 tablet (20 mg total) by mouth at bedtime. 10/17/16   Scot Jun, FNP  metFORMIN (GLUCOPHAGE) 1000 MG tablet 500 mg with breakfast and 1000 mg at dinnertime 10/20/16   Scot Jun, FNP  methocarbamol (ROBAXIN) 500 MG tablet TAKE 1 TABLET BY MOUTH 4 TIMES DAILY. 10/16/16   Scot Jun, FNP  QUEtiapine (SEROQUEL) 50 MG tablet Take 100 mg by mouth daily.    [provider]  SUMAtriptan (IMITREX) 25 MG tablet Take 1 tablet (25 mg total) by mouth once. May repeat in 2 hours if headache persists or recurs. 04/24/16 04/24/16  Dorena Dew, FNP  SUMAtriptan (IMITREX) 50 MG tablet Take 1 tablet (50 mg total) by mouth every 2 (two) hours as needed for migraine. May repeat in 2 hours if headache persists or recurs. 10/17/16   Scot Jun, FNP  topiramate (TOPAMAX) 50 MG tablet Take 1 tablet (50 mg total) by mouth 2 (two) times daily. 10/16/16   Scot Jun, FNP  topiramate (TOPAMAX) 50 MG tablet Take 1 tablet (50 mg total) by mouth 2 (two) times daily. 10/16/16 10/16/17  Scot Jun, FNP  venlafaxine XR (EFFEXOR XR) 75 MG 24 hr capsule Take 2 capsules (150 mg total) by mouth daily with breakfast. 10/17/16   Scot Jun, FNP    Past Medical, Surgical Family and Social History reviewed and updated.    Objective:    Wt Readings from Last 3 Encounters:  10/17/16 239 lb (108.4 kg)  08/08/16 236 lb (107 kg)  07/25/16 234 lb (106.1 kg)   Physical Exam  Constitutional: She is oriented to person, place, and time. She appears well-developed and well-nourished.  HENT:  Head: Normocephalic and atraumatic.  Neck: Normal range of motion. Neck supple.  Cardiovascular: Normal rate.   Pulmonary/Chest: Effort normal.  Abdominal: Soft. Bowel sounds are normal.  Musculoskeletal: Normal range of motion.  Neurological: She is alert and oriented to person, place, and time.  Skin: Skin is warm and dry.  Psychiatric: She has a normal  mood and affect. Her behavior is normal. Judgment and thought content normal.   Assessment & Plan:  1. Hyperglycemia - POCT glucose  [place] is being transferred to the day infusion center for extended observation, fluid rehydration, and blood sugar management.  RTC:  One week blood sugar recheck.   Carroll Sage. Kenton Kingfisher, MSN, FNP-C [facility]  [address] [place], [place] [address] [phone]

## 2016-11-21 ENCOUNTER — Encounter: Payer: Self-pay | Admitting: Family Medicine

## 2016-11-21 ENCOUNTER — Ambulatory Visit (INDEPENDENT_AMBULATORY_CARE_PROVIDER_SITE_OTHER): Payer: Medicaid Other | Admitting: Family Medicine

## 2016-11-21 VITALS — BP 142/82 | HR 80 | Temp 98.7°F | Resp 14 | Ht 67.0 in | Wt 231.0 lb

## 2016-11-21 DIAGNOSIS — R739 Hyperglycemia, unspecified: Secondary | ICD-10-CM

## 2016-11-21 DIAGNOSIS — M25511 Pain in right shoulder: Secondary | ICD-10-CM

## 2016-11-21 DIAGNOSIS — M25512 Pain in left shoulder: Secondary | ICD-10-CM

## 2016-11-21 DIAGNOSIS — M542 Cervicalgia: Secondary | ICD-10-CM

## 2016-11-21 LAB — GLUCOSE, CAPILLARY
Glucose-Capillary: 310 mg/dL — ABNORMAL HIGH (ref 65–99)
Glucose-Capillary: 275 mg/dL — ABNORMAL HIGH (ref 65–99)

## 2016-11-21 MED ORDER — INJECTION DEVICE FOR INSULIN DEVI
Freq: Once | Status: AC
  Administered 2016-11-21: 11:00:00

## 2016-11-21 MED ORDER — INJECTION DEVICE FOR INSULIN DEVI
Freq: Once | Status: DC
Start: 2016-11-21 — End: 2016-11-21

## 2016-11-21 MED ORDER — KETOROLAC TROMETHAMINE 30 MG/ML IJ SOLN: 30.0000 mg | Freq: Once | INTRAMUSCULAR | 0 refills | Status: DC

## 2016-11-21 MED ORDER — KETOROLAC TROMETHAMINE 30 MG/ML IJ SOLN
30.0000 mg | Freq: Once | INTRAMUSCULAR | Status: AC
  Administered 2016-11-21: 30 mg via INTRAMUSCULAR

## 2016-11-21 MED ORDER — CYCLOBENZAPRINE HCL 10 MG PO TABS: 10.0000 mg | ORAL_TABLET | Freq: Three times a day (TID) | ORAL | 0 refills | Status: DC | PRN

## 2016-11-21 MED ORDER — DICLOFENAC SODIUM 50 MG PO TBEC: 50.0000 mg | DELAYED_RELEASE_TABLET | Freq: Two times a day (BID) | ORAL | 0 refills | Status: DC

## 2016-11-21 MED FILL — CYCLOBENZAPRINE 10 MG TAB: 10 | 30 days supply | Qty: 90 | Fill #0

## 2016-11-21 MED FILL — DICLOFENAC SOD EC 50 MG TAB: 50 | 30 days supply | Qty: 60 | Fill #0

## 2016-11-21 NOTE — Progress Notes (Signed)
Patient ID: Catherine Pope, female    DOB: 1965/11/20, 51 y.o.   MRN: [id number]  PCP: Scot Jun, FNP  Chief Complaint  Patient presents with  . glucose check    Subjective:  HPI Catherine Pope is a 51 y.o. female presents for diabetes following and she is also experiencing on going neck and bilateral shoulder pain. Catherine Pope was seen in clinic 11/14/2016 for routine blood glucose check and was found to be hyperglycemic with a reading of 542. During that encounter she admitted to poor medication and diet compliance. She was treated with subcutaneous insulin and IV fluids to correct hyperglycemia. She returns today and reports that she is making better efforts to improve compliance with medications. Catherine Pope complains of worsening neck and bilateral shoulder pain. She denies recent or prior injury. Her pain is characterized as aching with accompanying neck stiffness.  Social History   Social History  . Marital status: Single    Spouse name: N/A  . Number of children: 2  . Years of education: 51 th   Occupational History  . unemployed    Social History Main Topics  . Smoking status: Current Every Day Smoker    Packs/day: 0.50    Types: Cigarettes  . Smokeless tobacco: Never Used  . Alcohol use No  . Drug use: No  . Sexual activity: Not on file   Other Topics Concern  . Not on file   Social History Narrative   Patient lives at home with her daughters and she is unemployed. Patient has two children.   Patient has a high school education.   Right handed.    Caffeine two cups daily.    Family History  Problem Relation Age of Onset  . Heart Problems Father    Review of Systems See HPI  Patient Active Problem List   Diagnosis Date Noted  . Essential hypertension 03/23/2016  . Laryngitis, acute 03/23/2016  . Neuropathy 03/23/2016  . Tobacco dependence 03/23/2016  . Diabetes mellitus ([place]) 11/26/2013  . Morbid obesity ([place]) 11/26/2013  . Migraine without aura  11/26/2013  . Other and unspecified hyperlipidemia 11/26/2013    No Known Allergies  Prior to Admission medications   Medication Sig Start Date End Date Taking? Authorizing Provider  Blood Glucose Monitoring Suppl (TRUE METRIX AIR GLUCOSE METER) w/Device KIT 1 each by Does not apply route 4 (four) times daily - after meals and at bedtime. 03/23/16  Yes Dorena Dew, FNP  clonazePAM (KLONOPIN) 0.5 MG tablet Take 1 tablet (0.5 mg total) by mouth 2 (two) times daily as needed for anxiety. 08/08/16  Yes Scot Jun, FNP  fish oil-omega-3 fatty acids 1000 MG capsule Take 2 capsules (2 g total) by mouth daily. 06/22/14  Yes Ernestina Patches, MD  gabapentin (NEURONTIN) 300 MG capsule Take 1 capsule (300 mg total) by mouth at bedtime. 10/17/16  Yes Scot Jun, FNP  glucose blood (TRUE METRIX BLOOD GLUCOSE TEST) test strip Use as instructed 03/23/16  Yes Dorena Dew, FNP  hydrochlorothiazide (HYDRODIURIL) 12.5 MG tablet Take 1 tablet (12.5 mg total) by mouth daily. 10/17/16  Yes Scot Jun, FNP  insulin detemir (LEVEMIR) 100 UNIT/ML injection INJECT 40 UNITS INTO THE SKIN AT BEDTIME. ONE TIME ORDER UNTIL LANTUS GETS APPROVED VIA PASS 10/17/16  Yes Scot Jun, FNP  insulin glargine (LANTUS) 100 UNIT/ML injection Inject 0.2 mLs (20 Units total) into the skin at bedtime. 10/09/16  Yes Tresa Garter, MD  Insulin Syringe-Needle U-100 (  INSULIN SYRINGE 1CC/30GX1/2") 30G X 1/2" 1 ML MISC 1 Syringe by Does not apply route daily. 03/23/16  Yes Dorena Dew, FNP  ipratropium (ATROVENT) 0.03 % nasal spray Place 2 sprays into both nostrils 2 (two) times daily. 10/17/16  Yes Scot Jun, FNP  ketoprofen (ORUDIS) 50 MG capsule Take 1 capsule (50 mg total) by mouth 4 (four) times daily as needed. 04/24/16  Yes Dorena Dew, FNP  Lancets Glory Rosebush ULTRASOFT) lancets Use as instructed 06/22/14  Yes Ernestina Patches, MD  linagliptin (TRADJENTA) 5 MG TABS tablet Take 1  tablet (5 mg total) by mouth daily. 10/23/16  Yes Scot Jun, FNP  lisinopril (PRINIVIL,ZESTRIL) 20 MG tablet Take 1 tablet (20 mg total) by mouth daily. 10/17/16  Yes Scot Jun, FNP  lovastatin (MEVACOR) 20 MG tablet Take 1 tablet (20 mg total) by mouth at bedtime. 10/17/16  Yes Scot Jun, FNP  metFORMIN (GLUCOPHAGE) 1000 MG tablet 500 mg with breakfast and 1000 mg at dinnertime 10/20/16  Yes Scot Jun, FNP  methocarbamol (ROBAXIN) 500 MG tablet TAKE 1 TABLET BY MOUTH 4 TIMES DAILY. 10/16/16  Yes Scot Jun, FNP  QUEtiapine (SEROQUEL) 50 MG tablet Take 100 mg by mouth daily.   Yes [provider]  SUMAtriptan (IMITREX) 50 MG tablet Take 1 tablet (50 mg total) by mouth every 2 (two) hours as needed for migraine. May repeat in 2 hours if headache persists or recurs. 10/17/16  Yes Scot Jun, FNP  topiramate (TOPAMAX) 50 MG tablet Take 1 tablet (50 mg total) by mouth 2 (two) times daily. 10/16/16  Yes Scot Jun, FNP  topiramate (TOPAMAX) 50 MG tablet Take 1 tablet (50 mg total) by mouth 2 (two) times daily. 10/16/16 10/16/17 Yes Scot Jun, FNP  venlafaxine XR (EFFEXOR XR) 75 MG 24 hr capsule Take 2 capsules (150 mg total) by mouth daily with breakfast. 10/17/16  Yes Scot Jun, FNP  SUMAtriptan (IMITREX) 25 MG tablet Take 1 tablet (25 mg total) by mouth once. May repeat in 2 hours if headache persists or recurs. 04/24/16 04/24/16  Dorena Dew, FNP    Past Medical, Surgical Family and Social History reviewed and updated.    Objective:   Today's Vitals   11/21/16 0846  BP: (!) 142/82  Pulse: 80  Resp: 14  Temp: 98.7 F (37.1 C)  TempSrc: Oral  SpO2: 96%  Weight: 231 lb (104.8 kg)  Height: 5' 7" (1.702 m)    Wt Readings from Last 3 Encounters:  11/21/16 231 lb (104.8 kg)  10/17/16 239 lb (108.4 kg)  08/08/16 236 lb (107 kg)   Physical Exam  Constitutional: She is oriented to person, place, and time. She  appears well-developed and well-nourished.  HENT:  Head: Normocephalic and atraumatic.  Eyes: Pupils are equal, round, and reactive to light. Conjunctivae are normal.  Neck: Normal range of motion. Neck supple.  Cardiovascular: Normal rate, regular rhythm, normal heart sounds and intact distal pulses.   Pulmonary/Chest: Effort normal and breath sounds normal.  Musculoskeletal: Normal range of motion. She exhibits tenderness. She exhibits no edema.       Cervical back: She exhibits tenderness.  Neurological: She is alert and oriented to person, place, and time.  Skin: Skin is warm and dry.  Psychiatric: She has a normal mood and affect. Her behavior is normal. Judgment and thought content normal.   Assessment & Plan:  1. Neck pain - ketorolac (TORADOL) 30 MG/ML injection  30 mg; Inject 1 mL (30 mg total) into the muscle once.  2. Hyperglycemia, blood sugar today 310 and reduced to 275 after administration of 10 units.  - injection device for insulin, administered 10 units Humalog.  -increase compliance with current diabetes regimen and monitor blood glucose.  3. Acute pain of both shoulders - ketorolac (TORADOL) 30 MG/ML injection 30 mg; Inject 1 mL (30 mg total) into the muscle once.  RTC: 3 months for chronic disease follow-up and hemoglobin A1C,  microalbumin screening  Carroll Sage. Kenton Kingfisher, MSN, FNP-C [facility]  [address] [place], [place] [address] [phone]

## 2016-11-22 MED FILL — VENLAFAXINE HCL ER 75 MG CA: 75 | 30 days supply | Qty: 60 | Fill #1

## 2016-11-22 MED FILL — GABAPENTIN 300 MG CAPSULE: 300 | 30 days supply | Qty: 30 | Fill #0

## 2016-11-22 MED FILL — TRUEPLUS SYR 0.3ML 30GX5/16: 30G X 5/16" | 30 days supply | Qty: 100 | Fill #2

## 2016-11-22 MED FILL — TRADJENTA 5 MG TABLET: 5 | 30 days supply | Qty: 30 | Fill #1

## 2016-11-22 MED FILL — LISINOPRIL 20 MG TAB: 20 | 30 days supply | Qty: 30 | Fill #1

## 2016-11-22 MED FILL — LOVASTATIN 20 MG TABLET: 20 | 30 days supply | Qty: 30 | Fill #1

## 2016-11-22 MED FILL — LEVEMIR 100 UNITS/ML VIAL: 100 | 25 days supply | Qty: 10 | Fill #1

## 2016-11-22 MED FILL — HYDROCHLOROTHIAZIDE 12.5 MG: 12.5 | 30 days supply | Qty: 30 | Fill #6

## 2016-11-22 MED FILL — TOPIRAMATE 50 MG TABLET: 50 | 30 days supply | Qty: 60 | Fill #1

## 2016-11-23 ENCOUNTER — Other Ambulatory Visit: Payer: Self-pay | Admitting: Family Medicine

## 2016-11-25 ENCOUNTER — Ambulatory Visit
Admission: RE | Admit: 2016-11-25 | Discharge: 2016-11-25 | Disposition: A | Discharge status: Home / Self Care | Payer: Medicaid Other | Source: Ambulatory Visit | Attending: Neurological Surgery | Admitting: Neurological Surgery

## 2016-11-25 DIAGNOSIS — M542 Cervicalgia: Secondary | ICD-10-CM

## 2016-11-29 ENCOUNTER — Other Ambulatory Visit: Payer: Self-pay | Admitting: Neurological Surgery

## 2016-12-15 NOTE — Pre-Procedure Instructions (Signed)
Gwendoline Judy  12/15/2016      [id number] - [address], [address] [place] [address] Phone: [phone] Fax: [phone]  [facility] [address] [place]), [place] - [address] [place] [address] Phone: [phone] Fax: [place] [place] [address] Phone: [phone] Fax: [phone]  [id number] - [address], [address] [place] [address] Phone: [phone] Fax: [phone]  [place], [place] - [address] [place] [address] Phone: [phone] Fax: [phone]    Your procedure is scheduled on September 20  Report to [facility] at 0930 A.M.  Call this number if you have problems the morning of surgery:  [phone]   Remember:  Do not eat food or drink liquids after midnight.   Continue all other medications as directed by your physician except follow these medication instructions before surgery   Take these medicines the morning of surgery with A SIP OF WATER  buPROPion (ZYBAN clonazePAM (KLONOPIN)  cyclobenzaprine (FLEXERIL) ipratropium (ATROVENT) metoprolol tartrate (LOPRESSOR) venlafaxine XR (EFFEXOR XR)  7 days prior to surgery STOP taking any Aspirin, Aleve, Naproxen, Ibuprofen, Motrin, Advil, Goody's, BC's, all herbal medications, fish oil, and all vitamins   WHAT DO I DO ABOUT MY DIABETES MEDICATION?   Marland Kitchen Do not take oral diabetes medicines (pills) the morning of surgery. metFORMIN (GLUCOPHAGE),   linagliptin (TRADJENTA)   . THE NIGHT BEFORE SURGERY, take ___15________ units of _insulin glargine (LANTUS)__________insulin.      . The day of surgery, do not take other diabetes  injectables, including Byetta (exenatide), Bydureon (exenatide ER), Victoza (liraglutide), or Trulicity (dulaglutide).  . If your CBG is greater than 220 mg/dL, you may take  of your sliding scale (correction) dose of insulin.   How to Manage Your Diabetes Before and After Surgery  Why is it important to control my blood sugar before and after surgery? . Improving blood sugar levels before and after surgery helps healing and can limit problems. . A way of improving blood sugar control is eating a healthy diet by: o  Eating less sugar and carbohydrates o  Increasing activity/exercise o  Talking with your doctor about reaching your blood sugar goals . High blood sugars (greater than 180 mg/dL) can raise your risk of infections and slow your recovery, so you will need to focus on controlling your diabetes during the weeks before surgery. . Make sure that the doctor who takes care of your diabetes knows about your planned surgery including the date and location.  How do I manage my blood sugar before surgery? . Check your blood sugar at least 4 times a day, starting 2 days before surgery, to make sure that the level is not too high or low. o Check your blood sugar the morning of your surgery when you wake up and every 2 hours until you get to the Short Stay unit. . If your blood sugar is less than 70 mg/dL, you will need to treat for low blood sugar: o Do not take insulin. o Treat a low blood sugar (less than 70 mg/dL) with  cup of clear juice (cranberry or apple), 4 glucose tablets, OR glucose gel. o  Recheck blood sugar in 15 minutes after treatment (to make sure it is greater than 70 mg/dL). If your blood sugar is not greater than 70 mg/dL on recheck, call [phone] for further instructions. . Report your blood sugar to the short stay nurse when you get to Short Stay.  . If you are admitted to the hospital after surgery: o Your blood sugar will be checked by the staff and you will  probably be given insulin after surgery (instead of oral diabetes medicines) to make sure you have good blood sugar levels. o The goal for blood sugar control after surgery is 80-180 mg/dL.    Do not wear jewelry, make-up or nail polish.  Do not wear lotions, powders, or perfumes, or deoderant.  Do not shave 48 hours prior to surgery.  Men may shave face and neck.  Do not bring valuables to the hospital.  [facility] is not responsible for any belongings or valuables.  Contacts, dentures or bridgework may not be worn into surgery.  Leave your suitcase in the car.  After surgery it may be brought to your room.  For patients admitted to the hospital, discharge time will be determined by your treatment team.  Patients discharged the day of surgery will not be allowed to drive home.    Special instructions:   [facility]- Preparing For Surgery  Before surgery, you can play an important role. Because skin is not sterile, your skin needs to be as free of germs as possible. You can reduce the number of germs on your skin by washing with CHG (chlorahexidine gluconate) Soap before surgery.  CHG is an antiseptic cleaner which kills germs and bonds with the skin to continue killing germs even after washing.  Please do not use if you have an allergy to CHG or antibacterial soaps. If your skin becomes reddened/irritated stop using the CHG.  Do not shave (including legs and underarms) for at least 48 hours prior to first CHG shower. It is OK to shave your face.  Please follow these instructions carefully.   1. Shower the NIGHT BEFORE SURGERY and the MORNING OF SURGERY with CHG.   2. If you chose to wash your hair, wash your hair first as usual with your normal shampoo.  3. After you shampoo, rinse your hair and body thoroughly to remove the shampoo.  4. Use CHG as you would any other liquid soap. You can apply CHG directly to the skin and wash gently with a scrungie or a clean washcloth.    5. Apply the CHG Soap to your body ONLY FROM THE NECK DOWN.  Do not use on open wounds or open sores. Avoid contact with your eyes, ears, mouth and genitals (private parts). Wash genitals (private parts) with your normal soap.  6. Wash thoroughly, paying special attention to the area where your surgery will be performed.  7. Thoroughly rinse your body with warm water from the neck down.  8. DO NOT shower/wash with your normal soap after using and rinsing off the CHG Soap.  9. Pat yourself dry with a CLEAN TOWEL.   10. Wear CLEAN PAJAMAS   11. Place CLEAN SHEETS on your bed the night of your first shower and DO NOT SLEEP WITH PETS.    Day of Surgery: Do not apply any deodorants/lotions. Please wear clean clothes to the hospital/surgery center.      Please read over the following fact sheets that you were given.

## 2016-12-18 ENCOUNTER — Ambulatory Visit (HOSPITAL_COMMUNITY)
Admission: RE | Admit: 2016-12-18 | Discharge: 2016-12-18 | Disposition: A | Discharge status: Home / Self Care | Payer: Medicaid Other | Source: Ambulatory Visit | Attending: Neurological Surgery | Admitting: Neurological Surgery

## 2016-12-18 ENCOUNTER — Encounter (HOSPITAL_COMMUNITY): Payer: Self-pay

## 2016-12-18 ENCOUNTER — Encounter (HOSPITAL_COMMUNITY)
Admission: RE | Admit: 2016-12-18 | Discharge: 2016-12-18 | Disposition: A | Discharge status: Home / Self Care | Payer: Medicaid Other | Source: Ambulatory Visit | Attending: Neurological Surgery | Admitting: Neurological Surgery

## 2016-12-18 DIAGNOSIS — Z01810 Encounter for preprocedural cardiovascular examination: Secondary | ICD-10-CM | POA: Diagnosis not present

## 2016-12-18 DIAGNOSIS — M47892 Other spondylosis, cervical region: Secondary | ICD-10-CM | POA: Insufficient documentation

## 2016-12-18 DIAGNOSIS — M4802 Spinal stenosis, cervical region: Secondary | ICD-10-CM | POA: Insufficient documentation

## 2016-12-18 DIAGNOSIS — R000 Tachycardia, unspecified: Secondary | ICD-10-CM | POA: Insufficient documentation

## 2016-12-18 DIAGNOSIS — M47812 Spondylosis without myelopathy or radiculopathy, cervical region: Secondary | ICD-10-CM | POA: Insufficient documentation

## 2016-12-18 HISTORY — DX: Anxiety disorder, unspecified: F41.9

## 2016-12-18 HISTORY — DX: Depression, unspecified: F32.A

## 2016-12-18 HISTORY — DX: Major depressive disorder, single episode, unspecified: F32.9

## 2016-12-18 LAB — GLUCOSE, CAPILLARY: Glucose-Capillary: 304 mg/dL — ABNORMAL HIGH (ref 65–99)

## 2016-12-18 LAB — BASIC METABOLIC PANEL
Anion gap: 9 (ref 5–15)
BUN: 8 mg/dL (ref 6–20)
CO2: 22 mmol/L (ref 22–32)
Calcium: 9.1 mg/dL (ref 8.9–10.3)
Chloride: 102 mmol/L (ref 101–111)
Creatinine, Ser: 0.89 mg/dL (ref 0.44–1.00)
GFR calc Af Amer: >60 mL/min (ref >60)
GFR calc non Af Amer: >60 mL/min (ref >60)
Glucose, Bld: 340 mg/dL — ABNORMAL HIGH (ref 65–99)
Potassium: 4 mmol/L (ref 3.5–5.1)
Sodium: 133 mmol/L — ABNORMAL LOW (ref 135–145)

## 2016-12-18 LAB — CBC WITH DIFFERENTIAL/PLATELET
Basophils Absolute: 0 10*3/uL (ref 0.0–0.1)
Basophils Relative: 0 %
Eosinophils Absolute: 0.2 10*3/uL (ref 0.0–0.7)
Eosinophils Relative: 2 %
HCT: 38.3 % (ref 36.0–46.0)
Hemoglobin: 13 g/dL (ref 12.0–15.0)
Lymphocytes Relative: 29 %
Lymphs Abs: 2.9 10*3/uL (ref 0.7–4.0)
MCH: 29.7 pg (ref 26.0–34.0)
MCHC: 33.9 g/dL (ref 30.0–36.0)
MCV: 87.6 fL (ref 78.0–100.0)
Monocytes Absolute: 0.6 10*3/uL (ref 0.1–1.0)
Monocytes Relative: 6 %
Neutro Abs: 6.3 10*3/uL (ref 1.7–7.7)
Neutrophils Relative %: 63 %
Platelets: 241 10*3/uL (ref 150–400)
RBC: 4.37 MIL/uL (ref 3.87–5.11)
RDW: 13.8 % (ref 11.5–15.5)
WBC: 10 10*3/uL (ref 4.0–10.5)

## 2016-12-18 LAB — HEMOGLOBIN A1C
Hgb A1c MFr Bld: 12.4 % — ABNORMAL HIGH (ref 4.8–5.6)
Mean Plasma Glucose: 309.18 mg/dL

## 2016-12-18 LAB — PROTIME-INR
INR: 1.05
Prothrombin Time: 13.6 seconds (ref 11.4–15.2)

## 2016-12-18 LAB — SURGICAL PCR SCREEN
MRSA, PCR: NEGATIVE
Staphylococcus aureus: NEGATIVE

## 2016-12-18 NOTE — Progress Notes (Signed)
PCP is Lavell Anchors, FNP  LOV 11/2016.   Denies any heart issues, murmur, or tests.  Did have heart tests back in 2003 at her mothers' insistence, since her older sister died at age 49 from heart attack.  No problems or tests since. Las A1C was 10/2016 13.8.  Her PCP has since added new meds. As far as checking her sugars, very infrequently, maybe 2-3 x a week.  Usually runs 180-200 range. And there was a discrepancy in her insulins.  She currently only takes the Levemir, not Lantus.  She understands that she will only be taking 20 units at bedtime the night before. She was instructed to be here at 0900 by office.

## 2016-12-18 NOTE — Progress Notes (Addendum)
Anesthesia Chart Review:  Pt is a 51 year old female scheduled for C6-7 ACDF on 12/27/2016 with Sherley Bounds, MD  - PCP is Molli Barrows, NP  PMH includes:  HTN, DM. Current smoker. BMI 38  - Glucose was 542 at last office visit with PCP 11/14/16; pt was admitted to their day center and given IVF and insulin.   Medications include: HCTZ, Levemir, Atrovent, Tradjenta, lisinopril, lovastatin, metformin, metoprolol  Preoperative labs reviewed.  HbA1c 12.4, glucose 340 - home fasting glucose 180-200.   CXR 12/18/16: No active cardiopulmonary disease.  EKG 12/18/16: Sinus tachycardia (101 bpm)  I spoke with pt's PCP about having surgery with uncontrolled DM.  Ms. Kenton Kingfisher reports pt does not adhere to treatments for DM and is likely not to improve her diabetes control.  I notified Janett Billow in Dr. Ronnald Ramp' office.   If pt's blood glucose acceptable, I anticipate pt can proceed as scheduled.   Willeen Cass, FNP-BC [facility] Short Stay [facility]/Anesthesiology Phone: [phone] 12/19/2016 3:44 PM

## 2016-12-20 ENCOUNTER — Telehealth: Payer: Self-pay | Admitting: Family Medicine

## 2016-12-20 MED FILL — LEVEMIR 100 UNITS/ML VIAL: 100 | 25 days supply | Qty: 10 | Fill #2

## 2016-12-20 MED FILL — TRADJENTA 5 MG TABLET: 5 | 30 days supply | Qty: 30 | Fill #2

## 2016-12-20 NOTE — Telephone Encounter (Signed)
Please contact patient to schedule a pre-surgical consult visit on Monday 9/24. Please advise patient to come in office: fasting (ok to drink water, black coffee, or un sweeten tea)  in order for blood sugar to be check. Advise her that I was: or  Contacted  by her surgeon as they are concerned with her continued uncontrolled blood sugars. Could she be worked in at 8:00 or 8:15.   Carroll Sage. Kenton Kingfisher, MSN, FNP-C [facility]  [address] [place], [place] [address] [phone]

## 2016-12-21 ENCOUNTER — Other Ambulatory Visit: Payer: Self-pay | Admitting: Family Medicine

## 2016-12-21 MED FILL — GABAPENTIN 300 MG CAPSULE: 300 | 30 days supply | Qty: 30 | Fill #1

## 2016-12-21 MED FILL — LOVASTATIN 20 MG TABLET: 20 | 30 days supply | Qty: 30 | Fill #2

## 2016-12-21 MED FILL — TOPIRAMATE 50 MG TABLET: 50 | 30 days supply | Qty: 60 | Fill #2

## 2016-12-21 MED FILL — LISINOPRIL 20 MG TAB: 20 | 30 days supply | Qty: 30 | Fill #2

## 2016-12-21 MED FILL — VENLAFAXINE HCL ER 75 MG CA: 75 | 30 days supply | Qty: 60 | Fill #2

## 2016-12-21 MED FILL — metFORMIN HCL 500 MG TABS: 500 | 30 days supply | Qty: 90 | Fill #1

## 2016-12-21 MED FILL — DICLOFENAC SOD EC 50 MG TAB: 50 | 30 days supply | Qty: 60 | Fill #0

## 2016-12-21 MED FILL — HYDROCHLOROTHIAZIDE 12.5 MG: 12.5 | 30 days supply | Qty: 30 | Fill #7

## 2016-12-25 ENCOUNTER — Encounter: Payer: Self-pay | Admitting: Family Medicine

## 2016-12-25 ENCOUNTER — Ambulatory Visit (INDEPENDENT_AMBULATORY_CARE_PROVIDER_SITE_OTHER): Payer: Medicaid Other | Admitting: Family Medicine

## 2016-12-25 VITALS — BP 128/68 | HR 80 | Temp 98.7°F | Resp 14 | Ht 66.0 in | Wt 237.0 lb

## 2016-12-25 DIAGNOSIS — Z23 Encounter for immunization: Secondary | ICD-10-CM | POA: Diagnosis not present

## 2016-12-25 DIAGNOSIS — E118 Type 2 diabetes mellitus with unspecified complications: Secondary | ICD-10-CM | POA: Diagnosis not present

## 2016-12-25 DIAGNOSIS — Z794 Long term (current) use of insulin: Secondary | ICD-10-CM

## 2016-12-25 LAB — GLUCOSE, CAPILLARY
Glucose-Capillary: 309 mg/dL — ABNORMAL HIGH (ref 65–99)
Glucose-Capillary: 337 mg/dL — ABNORMAL HIGH (ref 65–99)

## 2016-12-25 LAB — POCT GLYCOSYLATED HEMOGLOBIN (HGB A1C): Hemoglobin A1C: 13.4

## 2016-12-25 MED ORDER — CETIRIZINE HCL 10 MG PO TABS: 10.0000 mg | ORAL_TABLET | Freq: Every day | ORAL | 11 refills | Status: DC

## 2016-12-25 MED ORDER — INSULIN ASPART 100 UNIT/ML ~~LOC~~ SOLN
20.0000 [IU] | Freq: Once | SUBCUTANEOUS | Status: AC
  Administered 2016-12-25: 20 [IU] via SUBCUTANEOUS

## 2016-12-25 MED FILL — ALL DAY ALLERGY 10 MG TAB: 10 | 30 days supply | Qty: 30 | Fill #0

## 2016-12-25 NOTE — Progress Notes (Signed)
Patient ID: Cheney Gosch, female    DOB: 1965-12-15, 51 y.o.   MRN: [id number]  PCP: Scot Jun, FNP  Chief Complaint  Patient presents with  . Medical Clearance    Subjective:  HPI Gowri Suchan is a 51 y.o. female presents for evaluation of uncontrolled diabetes and medication counseling as she is schedule to undergo cervical spine surgery 12/27/2016. urgical clearance  Lasheena is scheduled for surgery 12/27/2016. On 9/172018, received a note from anesthesiology that patient's  blood glucose readings has been elevated persistently during preoperative work-up. Jenaya admits to continued non-compliance with insulin and antidiabetes oral medication. Reports that she is out of medication although intends to pick-up prescriptions today. Current CBG 337. She reports intermittent neuropathy of extremities. Denies visual disturbances or urinary frequency.  Social History   Social History  . Marital status: Single    Spouse name: N/A  . Number of children: 2  . Years of education: 95 th   Occupational History  . unemployed    Social History Main Topics  . Smoking status: Current Every Day Smoker    Packs/day: 0.50    Years: 15.00    Types: Cigarettes  . Smokeless tobacco: Never Used  . Alcohol use No  . Drug use: No  . Sexual activity: Not on file   Other Topics Concern  . Not on file   Social History Narrative   Patient lives at home with her daughters and she is unemployed. Patient has two children.   Patient has a high school education.   Right handed.    Caffeine two cups daily.    Family History  Problem Relation Age of Onset  . Heart Problems Father    Review of Systems See HPI  Patient Active Problem List   Diagnosis Date Noted  . Essential hypertension 03/23/2016  . Laryngitis, acute 03/23/2016  . Neuropathy 03/23/2016  . Tobacco dependence 03/23/2016  . Diabetes mellitus ([place]) 11/26/2013  . Morbid obesity ([place]) 11/26/2013  . Migraine without aura  11/26/2013  . Other and unspecified hyperlipidemia 11/26/2013    No Known Allergies  Prior to Admission medications   Medication Sig Start Date End Date Taking? Authorizing Provider  Blood Glucose Monitoring Suppl (TRUE METRIX AIR GLUCOSE METER) w/Device KIT 1 each by Does not apply route 4 (four) times daily - after meals and at bedtime. 03/23/16  Yes Dorena Dew, FNP  buPROPion (ZYBAN) 150 MG 12 hr tablet Take 150 mg by mouth 2 (two) times daily.   Yes [provider]  Cholecalciferol (VITAMIN D PO) Take 50 Units by mouth daily.   Yes [provider]  clonazePAM (KLONOPIN) 0.5 MG tablet Take 1 tablet (0.5 mg total) by mouth 2 (two) times daily as needed for anxiety. Patient taking differently: Take 0.5 mg by mouth 3 (three) times daily as needed for anxiety.  08/08/16  Yes Scot Jun, FNP  cyclobenzaprine (FLEXERIL) 10 MG tablet Take 1 tablet (10 mg total) by mouth 3 (three) times daily as needed for muscle spasms. 11/21/16  Yes Scot Jun, FNP  diclofenac (VOLTAREN) 50 MG EC tablet TAKE 1 TABLET BY MOUTH 2 TIMES DAILY. 12/21/16  Yes Scot Jun, FNP  fish oil-omega-3 fatty acids 1000 MG capsule Take 2 capsules (2 g total) by mouth daily. 06/22/14  Yes Ernestina Patches, MD  gabapentin (NEURONTIN) 300 MG capsule Take 1 capsule (300 mg total) by mouth at bedtime. 10/17/16  Yes Scot Jun, FNP  Garlic 6734  MG CAPS Take 1,000 mg by mouth daily.   Yes [provider]  glucose blood (TRUE METRIX BLOOD GLUCOSE TEST) test strip Use as instructed 03/23/16  Yes Dorena Dew, FNP  guaiFENesin (MUCINEX) 600 MG 12 hr tablet Take 1,200 mg by mouth 2 (two) times daily as needed for cough or to loosen phlegm.   Yes [provider]  hydrochlorothiazide (HYDRODIURIL) 12.5 MG tablet Take 1 tablet (12.5 mg total) by mouth daily. 10/17/16  Yes Scot Jun, FNP  insulin detemir (LEVEMIR) 100 UNIT/ML injection INJECT 40 UNITS INTO THE SKIN  AT BEDTIME. ONE TIME ORDER UNTIL LANTUS GETS APPROVED VIA PASS 10/17/16  Yes Scot Jun, FNP  insulin glargine (LANTUS) 100 UNIT/ML injection Inject 0.2 mLs (20 Units total) into the skin at bedtime. Patient taking differently: Inject 30 Units into the skin at bedtime.  10/09/16  Yes Jegede, Marlena Clipper, MD  Insulin Syringe-Needle U-100 (INSULIN SYRINGE 1CC/30GX1/2") 30G X 1/2" 1 ML MISC 1 Syringe by Does not apply route daily. 03/23/16  Yes Dorena Dew, FNP  ipratropium (ATROVENT) 0.03 % nasal spray Place 2 sprays into both nostrils 2 (two) times daily. 10/17/16  Yes Scot Jun, FNP  ketoprofen (ORUDIS) 50 MG capsule Take 1 capsule (50 mg total) by mouth 4 (four) times daily as needed. 04/24/16  Yes Dorena Dew, FNP  Lancets Glory Rosebush ULTRASOFT) lancets Use as instructed 06/22/14  Yes Ernestina Patches, MD  linagliptin (TRADJENTA) 5 MG TABS tablet Take 1 tablet (5 mg total) by mouth daily. 10/23/16  Yes Scot Jun, FNP  lisinopril (PRINIVIL,ZESTRIL) 20 MG tablet Take 1 tablet (20 mg total) by mouth daily. 10/17/16  Yes Scot Jun, FNP  lovastatin (MEVACOR) 20 MG tablet Take 1 tablet (20 mg total) by mouth at bedtime. 10/17/16  Yes Scot Jun, FNP  metFORMIN (GLUCOPHAGE) 1000 MG tablet 500 mg with breakfast and 1000 mg at dinnertime Patient taking differently: Take 500-1,000 mg by mouth 2 (two) times daily with a meal. 500 mg with breakfast and 1000 mg at dinnertime 10/20/16  Yes Scot Jun, FNP  methocarbamol (ROBAXIN) 500 MG tablet TAKE 1 TABLET BY MOUTH 4 TIMES DAILY. 11/23/16  Yes Scot Jun, FNP  metoprolol tartrate (LOPRESSOR) 50 MG tablet Take 50 mg by mouth 2 (two) times daily.   Yes [provider]  SUMAtriptan (IMITREX) 50 MG tablet Take 1 tablet (50 mg total) by mouth every 2 (two) hours as needed for migraine. May repeat in 2 hours if headache persists or recurs. 10/17/16  Yes Scot Jun, FNP  topiramate (TOPAMAX) 50  MG tablet Take 1 tablet (50 mg total) by mouth 2 (two) times daily. 10/16/16 10/16/17 Yes Scot Jun, FNP  venlafaxine XR (EFFEXOR XR) 75 MG 24 hr capsule Take 2 capsules (150 mg total) by mouth daily with breakfast. 10/17/16  Yes Scot Jun, FNP  SUMAtriptan (IMITREX) 25 MG tablet Take 1 tablet (25 mg total) by mouth once. May repeat in 2 hours if headache persists or recurs. Patient not taking: Reported on 12/14/2016 04/24/16 04/24/16  Dorena Dew, FNP  topiramate (TOPAMAX) 50 MG tablet Take 1 tablet (50 mg total) by mouth 2 (two) times daily. Patient not taking: Reported on 12/25/2016 10/16/16   Scot Jun, FNP    Past Medical, Surgical Family and Social History reviewed and updated.    Objective:   Today's Vitals   12/25/16 0836  BP: 128/68  Pulse: 80  Resp: 14  Temp: 98.7 F (37.1 C)  TempSrc: Oral  SpO2: 98%  Weight: 237 lb (107.5 kg)  Height: 5' 6" (1.676 m)    Wt Readings from Last 3 Encounters:  12/25/16 237 lb (107.5 kg)  12/18/16 237 lb (107.5 kg)  11/21/16 231 lb (104.8 kg)    Physical Exam  Constitutional: She is oriented to person, place, and time. She appears well-developed.  HENT:  Head: Normocephalic and atraumatic.  Neck: Normal range of motion.  Cardiovascular: Normal rate, regular rhythm, normal heart sounds and intact distal pulses.   Pulmonary/Chest: Effort normal and breath sounds normal.  Musculoskeletal: Normal range of motion.  Lymphadenopathy:    She has no cervical adenopathy.  Neurological: She is alert and oriented to person, place, and time.  Skin: Skin is dry.  Psychiatric: She has a normal mood and affect. Her behavior is normal. Thought content normal.  Poor judgment regarding overall health and chronic disease management    Assessment & Plan:  1. Type 2 diabetes mellitus with complication, with long-term current use of insulin (HCC)-uncontrolled secondary to medication non-compliance  - POCT glycosylated  hemoglobin (Hb A1C)-13.4 today, hyperglycemic today 334.  - insulin aspart (novoLOG) injection 20 Units; Inject 0.2 mLs (20 Units total) into the skin once -Rechecked CBG 309. -Resume current diabetes medication regimen  -Education patient extensive regarding adverse affects of continued poor management of diabetes and negative of affects of hyperglycemia on surgical wound healing and renal function. Patient verbalized understanding and reports to improve compliance.   3. Need for influenza vaccination - Flu Vaccine QUAD 36+ mos IM  RTC: 2 -3 weeks postoperative for Diabetes follow-up   Carroll Sage. Kenton Kingfisher, MSN, FNP-C [facility]  [address] [place],  [address] [phone]

## 2016-12-26 NOTE — Progress Notes (Signed)
Spoke to Dr. Glennon Mac, C about CBG's at PCP yesterday. Per Dr. Glennon Mac, C patient can take full dose of lantus tonight. I also advised patient that if her CBG was elevated on arrival she may be cancelled.

## 2016-12-27 ENCOUNTER — Observation Stay (HOSPITAL_COMMUNITY)
Admission: RE | Admit: 2016-12-27 | Discharge: 2016-12-28 | Disposition: A | Discharge status: Home / Self Care | Payer: Medicaid Other | Source: Ambulatory Visit | Attending: Neurological Surgery | Admitting: Neurological Surgery

## 2016-12-27 ENCOUNTER — Encounter (HOSPITAL_COMMUNITY)
Admission: RE | Disposition: A | Discharge status: Home / Self Care | Payer: Self-pay | Source: Ambulatory Visit | Attending: Neurological Surgery

## 2016-12-27 ENCOUNTER — Ambulatory Visit (HOSPITAL_COMMUNITY): Payer: Medicaid Other

## 2016-12-27 ENCOUNTER — Ambulatory Visit (HOSPITAL_COMMUNITY): Payer: Medicaid Other | Admitting: Emergency Medicine

## 2016-12-27 ENCOUNTER — Encounter (HOSPITAL_COMMUNITY): Payer: Self-pay | Admitting: *Deleted

## 2016-12-27 DIAGNOSIS — Z6838 Body mass index (BMI) 38.0-38.9, adult: Secondary | ICD-10-CM | POA: Diagnosis not present

## 2016-12-27 DIAGNOSIS — Z794 Long term (current) use of insulin: Secondary | ICD-10-CM | POA: Diagnosis not present

## 2016-12-27 DIAGNOSIS — Z90710 Acquired absence of both cervix and uterus: Secondary | ICD-10-CM | POA: Insufficient documentation

## 2016-12-27 DIAGNOSIS — F419 Anxiety disorder, unspecified: Secondary | ICD-10-CM | POA: Insufficient documentation

## 2016-12-27 DIAGNOSIS — E669 Obesity, unspecified: Secondary | ICD-10-CM | POA: Diagnosis not present

## 2016-12-27 DIAGNOSIS — G43909 Migraine, unspecified, not intractable, without status migrainosus: Secondary | ICD-10-CM | POA: Insufficient documentation

## 2016-12-27 DIAGNOSIS — M4802 Spinal stenosis, cervical region: Secondary | ICD-10-CM | POA: Insufficient documentation

## 2016-12-27 DIAGNOSIS — R0602 Shortness of breath: Secondary | ICD-10-CM | POA: Insufficient documentation

## 2016-12-27 DIAGNOSIS — M50223 Other cervical disc displacement at C6-C7 level: Principal | ICD-10-CM | POA: Insufficient documentation

## 2016-12-27 DIAGNOSIS — M4322 Fusion of spine, cervical region: Secondary | ICD-10-CM | POA: Diagnosis present

## 2016-12-27 DIAGNOSIS — Z419 Encounter for procedure for purposes other than remedying health state, unspecified: Secondary | ICD-10-CM

## 2016-12-27 DIAGNOSIS — M79601 Pain in right arm: Secondary | ICD-10-CM | POA: Insufficient documentation

## 2016-12-27 DIAGNOSIS — Z79899 Other long term (current) drug therapy: Secondary | ICD-10-CM | POA: Diagnosis not present

## 2016-12-27 DIAGNOSIS — E119 Type 2 diabetes mellitus without complications: Secondary | ICD-10-CM | POA: Insufficient documentation

## 2016-12-27 DIAGNOSIS — M47892 Other spondylosis, cervical region: Secondary | ICD-10-CM | POA: Insufficient documentation

## 2016-12-27 DIAGNOSIS — F17210 Nicotine dependence, cigarettes, uncomplicated: Secondary | ICD-10-CM | POA: Insufficient documentation

## 2016-12-27 DIAGNOSIS — M79602 Pain in left arm: Secondary | ICD-10-CM | POA: Diagnosis not present

## 2016-12-27 DIAGNOSIS — F329 Major depressive disorder, single episode, unspecified: Secondary | ICD-10-CM | POA: Diagnosis not present

## 2016-12-27 DIAGNOSIS — I10 Essential (primary) hypertension: Secondary | ICD-10-CM | POA: Insufficient documentation

## 2016-12-27 HISTORY — PX: ANTERIOR CERVICAL DECOMP/DISCECTOMY FUSION: SHX1161

## 2016-12-27 LAB — GLUCOSE, CAPILLARY
Glucose-Capillary: 228 mg/dL — ABNORMAL HIGH (ref 65–99)
Glucose-Capillary: 167 mg/dL — ABNORMAL HIGH (ref 65–99)
Glucose-Capillary: 247 mg/dL — ABNORMAL HIGH (ref 65–99)
Glucose-Capillary: 333 mg/dL — ABNORMAL HIGH (ref 65–99)

## 2016-12-27 SURGERY — ANTERIOR CERVICAL DECOMPRESSION/DISCECTOMY FUSION 1 LEVEL
Anesthesia: General | Site: Spine Cervical

## 2016-12-27 MED ORDER — PROMETHAZINE HCL 25 MG/ML IJ SOLN: 6.2500 mg | INTRAMUSCULAR | Status: DC | PRN

## 2016-12-27 MED ORDER — MENTHOL 3 MG MT LOZG
1.0000 | LOZENGE | OROMUCOSAL | Status: DC | PRN
  Filled 2016-12-27: qty 9

## 2016-12-27 MED ORDER — IPRATROPIUM BROMIDE 0.03 % NA SOLN
2.0000 | Freq: Two times a day (BID) | NASAL | Status: DC
  Filled 2016-12-27: qty 30

## 2016-12-27 MED ORDER — SODIUM CHLORIDE 0.9 % IR SOLN
Status: DC | PRN
  Administered 2016-12-27: 17:00:00

## 2016-12-27 MED ORDER — LACTATED RINGERS IV SOLN: INTRAVENOUS | Status: DC

## 2016-12-27 MED ORDER — ACETAMINOPHEN 650 MG RE SUPP
650.0000 mg | RECTAL | Status: DC | PRN
Start: 2016-12-27 — End: 2016-12-28

## 2016-12-27 MED ORDER — CEFAZOLIN SODIUM-DEXTROSE 2-4 GM/100ML-% IV SOLN
2.0000 g | INTRAVENOUS | Status: AC
  Administered 2016-12-27: 2 g via INTRAVENOUS
  Filled 2016-12-27: qty 100

## 2016-12-27 MED ORDER — ONDANSETRON HCL 4 MG PO TABS: 4.0000 mg | ORAL_TABLET | Freq: Four times a day (QID) | ORAL | Status: DC | PRN

## 2016-12-27 MED ORDER — ROCURONIUM BROMIDE 100 MG/10ML IV SOLN
INTRAVENOUS | Status: DC | PRN
  Administered 2016-12-27: 50 mg via INTRAVENOUS
  Administered 2016-12-27: 10 mg via INTRAVENOUS

## 2016-12-27 MED ORDER — PHENYLEPHRINE 40 MCG/ML (10ML) SYRINGE FOR IV PUSH (FOR BLOOD PRESSURE SUPPORT)
PREFILLED_SYRINGE | INTRAVENOUS | Status: AC
  Filled 2016-12-27: qty 10

## 2016-12-27 MED ORDER — BUPROPION HCL ER (SR) 150 MG PO TB12
150.0000 mg | ORAL_TABLET | Freq: Two times a day (BID) | ORAL | Status: DC
  Administered 2016-12-27 – 2016-12-28 (×2): 150 mg via ORAL
  Filled 2016-12-27 (×4): qty 1

## 2016-12-27 MED ORDER — SODIUM CHLORIDE 0.9 % IV SOLN: 250.0000 mL | INTRAVENOUS | Status: DC

## 2016-12-27 MED ORDER — METOPROLOL TARTRATE 25 MG PO TABS
50.0000 mg | ORAL_TABLET | Freq: Two times a day (BID) | ORAL | Status: DC
Start: 2016-12-27 — End: 2016-12-28
  Administered 2016-12-27 – 2016-12-28 (×2): 50 mg via ORAL
  Filled 2016-12-27 (×2): qty 2

## 2016-12-27 MED ORDER — MIDAZOLAM HCL 5 MG/5ML IJ SOLN
INTRAMUSCULAR | Status: DC | PRN
  Administered 2016-12-27: 2 mg via INTRAVENOUS

## 2016-12-27 MED ORDER — HYDROMORPHONE HCL 1 MG/ML IJ SOLN
INTRAMUSCULAR | Status: DC | PRN
  Administered 2016-12-27: 0.5 mg via INTRAVENOUS

## 2016-12-27 MED ORDER — PROPOFOL 10 MG/ML IV BOLUS
INTRAVENOUS | Status: AC
  Filled 2016-12-27: qty 20

## 2016-12-27 MED ORDER — CHLORHEXIDINE GLUCONATE CLOTH 2 % EX PADS: 6.0000 | MEDICATED_PAD | Freq: Once | CUTANEOUS | Status: DC

## 2016-12-27 MED ORDER — LISINOPRIL 20 MG PO TABS
20.0000 mg | ORAL_TABLET | Freq: Every day | ORAL | Status: DC
  Administered 2016-12-27 – 2016-12-28 (×2): 20 mg via ORAL
  Filled 2016-12-27 (×2): qty 1

## 2016-12-27 MED ORDER — ONDANSETRON HCL 4 MG/2ML IJ SOLN
4.0000 mg | Freq: Four times a day (QID) | INTRAMUSCULAR | Status: DC | PRN
  Filled 2016-12-27: qty 2

## 2016-12-27 MED ORDER — SODIUM CHLORIDE 0.9% FLUSH: 3.0000 mL | INTRAVENOUS | Status: DC | PRN

## 2016-12-27 MED ORDER — INSULIN ASPART 100 UNIT/ML ~~LOC~~ SOLN
0.0000 [IU] | Freq: Three times a day (TID) | SUBCUTANEOUS | Status: DC
  Administered 2016-12-28: 5 [IU] via SUBCUTANEOUS

## 2016-12-27 MED ORDER — LACTATED RINGERS IV SOLN
INTRAVENOUS | Status: DC | PRN
  Administered 2016-12-27: 13:00:00 via INTRAVENOUS

## 2016-12-27 MED ORDER — ACETAMINOPHEN 325 MG PO TABS: 650.0000 mg | ORAL_TABLET | ORAL | Status: DC | PRN

## 2016-12-27 MED ORDER — METOPROLOL TARTRATE 50 MG PO TABS
ORAL_TABLET | ORAL | Status: AC
  Administered 2016-12-27: 50 mg via ORAL
  Filled 2016-12-27: qty 1

## 2016-12-27 MED ORDER — PHENYLEPHRINE HCL 10 MG/ML IJ SOLN
INTRAMUSCULAR | Status: DC | PRN
  Administered 2016-12-27 (×2): 80 ug via INTRAVENOUS

## 2016-12-27 MED ORDER — CEFAZOLIN SODIUM-DEXTROSE 2-4 GM/100ML-% IV SOLN
2.0000 g | Freq: Three times a day (TID) | INTRAVENOUS | Status: AC
  Administered 2016-12-27 – 2016-12-28 (×2): 2 g via INTRAVENOUS
  Filled 2016-12-27 (×2): qty 100

## 2016-12-27 MED ORDER — METHOCARBAMOL 500 MG PO TABS
500.0000 mg | ORAL_TABLET | Freq: Four times a day (QID) | ORAL | Status: DC | PRN
  Administered 2016-12-27 – 2016-12-28 (×3): 500 mg via ORAL
  Filled 2016-12-27 (×2): qty 1

## 2016-12-27 MED ORDER — PROPOFOL 10 MG/ML IV BOLUS
INTRAVENOUS | Status: DC | PRN
  Administered 2016-12-27: 170 mg via INTRAVENOUS

## 2016-12-27 MED ORDER — ONDANSETRON HCL 4 MG/2ML IJ SOLN
INTRAMUSCULAR | Status: AC
  Filled 2016-12-27: qty 2

## 2016-12-27 MED ORDER — LINAGLIPTIN 5 MG PO TABS
5.0000 mg | ORAL_TABLET | Freq: Every day | ORAL | Status: DC
Start: 2016-12-27 — End: 2016-12-28
  Administered 2016-12-27 – 2016-12-28 (×2): 5 mg via ORAL
  Filled 2016-12-27 (×2): qty 1

## 2016-12-27 MED ORDER — HYDROMORPHONE HCL 1 MG/ML IJ SOLN
INTRAMUSCULAR | Status: AC
  Filled 2016-12-27: qty 0.5

## 2016-12-27 MED ORDER — BUPIVACAINE HCL 0.5 % IJ SOLN
INTRAMUSCULAR | Status: DC | PRN
  Administered 2016-12-27: 4 mL

## 2016-12-27 MED ORDER — METHOCARBAMOL 1000 MG/10ML IJ SOLN
500.0000 mg | Freq: Four times a day (QID) | INTRAVENOUS | Status: DC | PRN
  Filled 2016-12-27: qty 5

## 2016-12-27 MED ORDER — FENTANYL CITRATE (PF) 250 MCG/5ML IJ SOLN
INTRAMUSCULAR | Status: AC
  Filled 2016-12-27: qty 5

## 2016-12-27 MED ORDER — LIDOCAINE 2% (20 MG/ML) 5 ML SYRINGE
INTRAMUSCULAR | Status: AC
  Filled 2016-12-27: qty 5

## 2016-12-27 MED ORDER — HYDROMORPHONE HCL 1 MG/ML IJ SOLN
INTRAMUSCULAR | Status: AC
  Filled 2016-12-27: qty 1

## 2016-12-27 MED ORDER — ONDANSETRON HCL 4 MG/2ML IJ SOLN
INTRAMUSCULAR | Status: DC | PRN
  Administered 2016-12-27: 4 mg via INTRAVENOUS

## 2016-12-27 MED ORDER — MIDAZOLAM HCL 2 MG/2ML IJ SOLN
INTRAMUSCULAR | Status: AC
  Filled 2016-12-27: qty 2

## 2016-12-27 MED ORDER — ROCURONIUM BROMIDE 10 MG/ML (PF) SYRINGE
PREFILLED_SYRINGE | INTRAVENOUS | Status: AC
  Filled 2016-12-27: qty 5

## 2016-12-27 MED ORDER — SENNA 8.6 MG PO TABS
1.0000 | ORAL_TABLET | Freq: Two times a day (BID) | ORAL | Status: DC
  Administered 2016-12-27 – 2016-12-28 (×2): 8.6 mg via ORAL
  Filled 2016-12-27 (×2): qty 1

## 2016-12-27 MED ORDER — ONDANSETRON HCL 4 MG/2ML IJ SOLN
4.0000 mg | INTRAMUSCULAR | Status: DC | PRN
  Administered 2016-12-27: 4 mg via INTRAVENOUS

## 2016-12-27 MED ORDER — INSULIN ASPART 100 UNIT/ML ~~LOC~~ SOLN: 0.0000 [IU] | Freq: Three times a day (TID) | SUBCUTANEOUS | Status: DC

## 2016-12-27 MED ORDER — TOPIRAMATE 25 MG PO TABS: 50.0000 mg | ORAL_TABLET | Freq: Two times a day (BID) | ORAL | Status: DC

## 2016-12-27 MED ORDER — SODIUM CHLORIDE 0.9% FLUSH: 3.0000 mL | Freq: Two times a day (BID) | INTRAVENOUS | Status: DC

## 2016-12-27 MED ORDER — OXYCODONE HCL 5 MG PO TABS
5.0000 mg | ORAL_TABLET | ORAL | Status: DC | PRN
  Administered 2016-12-27 – 2016-12-28 (×5): 10 mg via ORAL
  Filled 2016-12-27 (×4): qty 2

## 2016-12-27 MED ORDER — METHOCARBAMOL 500 MG PO TABS
ORAL_TABLET | ORAL | Status: AC
  Filled 2016-12-27: qty 1

## 2016-12-27 MED ORDER — OXYCODONE HCL 5 MG PO TABS
ORAL_TABLET | ORAL | Status: AC
Start: 2016-12-27 — End: 2016-12-28
  Filled 2016-12-27: qty 2

## 2016-12-27 MED ORDER — HYDROMORPHONE HCL 1 MG/ML IJ SOLN
0.2500 mg | INTRAMUSCULAR | Status: DC | PRN
  Administered 2016-12-27 (×4): 0.5 mg via INTRAVENOUS

## 2016-12-27 MED ORDER — THROMBIN 5000 UNITS EX SOLR
CUTANEOUS | Status: AC
  Filled 2016-12-27: qty 15000

## 2016-12-27 MED ORDER — ARTIFICIAL TEARS OPHTHALMIC OINT
TOPICAL_OINTMENT | OPHTHALMIC | Status: AC
  Filled 2016-12-27: qty 3.5

## 2016-12-27 MED ORDER — METFORMIN HCL 500 MG PO TABS
1000.0000 mg | ORAL_TABLET | Freq: Every day | ORAL | Status: DC
  Administered 2016-12-27: 1000 mg via ORAL
  Filled 2016-12-27: qty 2

## 2016-12-27 MED ORDER — HEMOSTATIC AGENTS (NO CHARGE) OPTIME
TOPICAL | Status: DC | PRN
  Administered 2016-12-27: 1 via TOPICAL

## 2016-12-27 MED ORDER — THROMBIN 5000 UNITS EX SOLR
OROMUCOSAL | Status: DC | PRN
  Administered 2016-12-27: 17:00:00 via TOPICAL

## 2016-12-27 MED ORDER — INSULIN ASPART 100 UNIT/ML ~~LOC~~ SOLN
0.0000 [IU] | Freq: Every day | SUBCUTANEOUS | Status: DC
  Administered 2016-12-27: 4 [IU] via SUBCUTANEOUS

## 2016-12-27 MED ORDER — FENTANYL CITRATE (PF) 100 MCG/2ML IJ SOLN
INTRAMUSCULAR | Status: DC | PRN
  Administered 2016-12-27: 100 ug via INTRAVENOUS

## 2016-12-27 MED ORDER — ONDANSETRON HCL 4 MG PO TABS: 4.0000 mg | ORAL_TABLET | ORAL | Status: DC | PRN

## 2016-12-27 MED ORDER — VENLAFAXINE HCL ER 75 MG PO CP24
150.0000 mg | ORAL_CAPSULE | Freq: Every day | ORAL | Status: DC
  Filled 2016-12-27 (×2): qty 2

## 2016-12-27 MED ORDER — METOPROLOL TARTRATE 50 MG PO TABS
50.0000 mg | ORAL_TABLET | Freq: Once | ORAL | Status: AC
  Administered 2016-12-27: 50 mg via ORAL

## 2016-12-27 MED ORDER — BUPIVACAINE HCL (PF) 0.5 % IJ SOLN
INTRAMUSCULAR | Status: AC
  Filled 2016-12-27: qty 30

## 2016-12-27 MED ORDER — METFORMIN HCL 500 MG PO TABS
500.0000 mg | ORAL_TABLET | Freq: Every day | ORAL | Status: DC
Start: 2016-12-28 — End: 2016-12-28
  Administered 2016-12-28: 500 mg via ORAL
  Filled 2016-12-27: qty 1

## 2016-12-27 MED ORDER — POTASSIUM CHLORIDE IN NACL 20-0.9 MEQ/L-% IV SOLN: INTRAVENOUS | Status: DC

## 2016-12-27 MED ORDER — LIDOCAINE HCL (CARDIAC) 20 MG/ML IV SOLN
INTRAVENOUS | Status: DC | PRN
  Administered 2016-12-27: 100 mg via INTRAVENOUS

## 2016-12-27 MED ORDER — THROMBIN 5000 UNITS EX SOLR
CUTANEOUS | Status: DC | PRN
  Administered 2016-12-27 (×2): 5000 [IU] via TOPICAL

## 2016-12-27 MED ORDER — HYDROCHLOROTHIAZIDE 25 MG PO TABS
12.5000 mg | ORAL_TABLET | Freq: Every day | ORAL | Status: DC
  Administered 2016-12-27 – 2016-12-28 (×2): 12.5 mg via ORAL
  Filled 2016-12-27 (×2): qty 1

## 2016-12-27 MED ORDER — DEXAMETHASONE SODIUM PHOSPHATE 10 MG/ML IJ SOLN
10.0000 mg | INTRAMUSCULAR | Status: AC
  Administered 2016-12-27: 10 mg via INTRAVENOUS
  Filled 2016-12-27: qty 1

## 2016-12-27 MED ORDER — GABAPENTIN 300 MG PO CAPS
300.0000 mg | ORAL_CAPSULE | Freq: Every day | ORAL | Status: DC
  Administered 2016-12-27: 300 mg via ORAL
  Filled 2016-12-27: qty 1

## 2016-12-27 MED ORDER — PHENOL 1.4 % MT LIQD
1.0000 | OROMUCOSAL | Status: DC | PRN
  Administered 2016-12-28: 1 via OROMUCOSAL

## 2016-12-27 MED ORDER — SUGAMMADEX SODIUM 200 MG/2ML IV SOLN
INTRAVENOUS | Status: DC | PRN
  Administered 2016-12-27: 200 mg via INTRAVENOUS

## 2016-12-27 SURGICAL SUPPLY — 52 items
APL SKNCLS STERI-STRIP NONHPOA (GAUZE/BANDAGES/DRESSINGS) ×1
BAG DECANTER FOR FLEXI CONT (MISCELLANEOUS) ×2 IMPLANT
BASKET BONE COLLECTION (BASKET) IMPLANT
BENZOIN TINCTURE PRP APPL 2/3 (GAUZE/BANDAGES/DRESSINGS) ×2 IMPLANT
BIT DRILL 2.3 12 FIXED (INSTRUMENTS) IMPLANT
BUR MATCHSTICK NEURO 3.0 LAGG (BURR) ×2 IMPLANT
CAGE C-PTI MED 6 7D (Cage) ×1 IMPLANT
CANISTER SUCT 3000ML PPV (MISCELLANEOUS) ×2 IMPLANT
CARTRIDGE OIL MAESTRO DRILL (MISCELLANEOUS) ×1 IMPLANT
DIFFUSER DRILL AIR PNEUMATIC (MISCELLANEOUS) ×2 IMPLANT
DRAPE C-ARM 42X72 X-RAY (DRAPES) ×4 IMPLANT
DRAPE LAPAROTOMY 100X72 PEDS (DRAPES) ×2 IMPLANT
DRAPE MICROSCOPE LEICA (MISCELLANEOUS) ×2 IMPLANT
DRAPE POUCH INSTRU U-SHP 10X18 (DRAPES) ×2 IMPLANT
DRILL 12MM (INSTRUMENTS) ×2
DRSG OPSITE POSTOP 3X4 (GAUZE/BANDAGES/DRESSINGS) ×1 IMPLANT
DURAPREP 6ML APPLICATOR 50/CS (WOUND CARE) ×2 IMPLANT
ELECT COATED BLADE 2.86 ST (ELECTRODE) ×2 IMPLANT
ELECT REM PT RETURN 9FT ADLT (ELECTROSURGICAL) ×2
ELECTRODE REM PT RTRN 9FT ADLT (ELECTROSURGICAL) ×1 IMPLANT
GAUZE SPONGE 4X4 16PLY XRAY LF (GAUZE/BANDAGES/DRESSINGS) IMPLANT
GLOVE BIO SURGEON STRL SZ7 (GLOVE) IMPLANT
GLOVE BIO SURGEON STRL SZ8 (GLOVE) ×2 IMPLANT
GLOVE BIOGEL PI IND STRL 7.0 (GLOVE) IMPLANT
GLOVE BIOGEL PI INDICATOR 7.0 (GLOVE)
GOWN STRL REUS W/ TWL LRG LVL3 (GOWN DISPOSABLE) IMPLANT
GOWN STRL REUS W/ TWL XL LVL3 (GOWN DISPOSABLE) IMPLANT
GOWN STRL REUS W/TWL 2XL LVL3 (GOWN DISPOSABLE) ×2 IMPLANT
GOWN STRL REUS W/TWL LRG LVL3 (GOWN DISPOSABLE)
GOWN STRL REUS W/TWL XL LVL3 (GOWN DISPOSABLE)
HEMOSTAT POWDER KIT SURGIFOAM (HEMOSTASIS) ×2 IMPLANT
KIT BASIN OR (CUSTOM PROCEDURE TRAY) ×2 IMPLANT
KIT ROOM TURNOVER OR (KITS) ×2 IMPLANT
NDL HYPO 25X1 1.5 SAFETY (NEEDLE) ×1 IMPLANT
NDL SPNL 20GX3.5 QUINCKE YW (NEEDLE) ×1 IMPLANT
NEEDLE HYPO 25X1 1.5 SAFETY (NEEDLE) ×2 IMPLANT
NEEDLE SPNL 20GX3.5 QUINCKE YW (NEEDLE) ×2 IMPLANT
NS IRRIG 1000ML POUR BTL (IV SOLUTION) ×2 IMPLANT
OIL CARTRIDGE MAESTRO DRILL (MISCELLANEOUS) ×2
PACK LAMINECTOMY NEURO (CUSTOM PROCEDURE TRAY) ×2 IMPLANT
PAD ARMBOARD 7.5X6 YLW CONV (MISCELLANEOUS) ×2 IMPLANT
PIN DISTRACTION 14MM (PIN) ×4 IMPLANT
PLATE 14MM (Plate) ×1 IMPLANT
RUBBERBAND STERILE (MISCELLANEOUS) ×4 IMPLANT
SCREW 14MM (Screw) ×4 IMPLANT
SPONGE INTESTINAL PEANUT (DISPOSABLE) ×2 IMPLANT
SPONGE SURGIFOAM ABS GEL SZ50 (HEMOSTASIS) ×2 IMPLANT
STRIP CLOSURE SKIN 1/2X4 (GAUZE/BANDAGES/DRESSINGS) ×2 IMPLANT
SUT VIC AB 3-0 SH 8-18 (SUTURE) ×2 IMPLANT
TOWEL GREEN STERILE (TOWEL DISPOSABLE) ×2 IMPLANT
TOWEL GREEN STERILE FF (TOWEL DISPOSABLE) ×2 IMPLANT
WATER STERILE IRR 1000ML POUR (IV SOLUTION) ×2 IMPLANT

## 2016-12-27 NOTE — Anesthesia Procedure Notes (Signed)
Procedure Name: Intubation Date/Time: 12/27/2016 3:51 PM Performed by: Jenne Campus Pre-anesthesia Checklist: Patient identified, Emergency Drugs available, Suction available and Patient being monitored Patient Re-evaluated:Patient Re-evaluated prior to induction Oxygen Delivery Method: Circle System Utilized Preoxygenation: Pre-oxygenation with 100% oxygen Induction Type: IV induction Ventilation: Mask ventilation without difficulty and Oral airway inserted - appropriate to patient size Grade View: Grade I Tube type: Oral Tube size: 7.0 mm Number of attempts: 1 Airway Equipment and Method: Stylet,  Oral airway and Video-laryngoscopy Placement Confirmation: ETT inserted through vocal cords under direct vision,  positive ETCO2 and breath sounds checked- equal and bilateral Secured at: 22 cm Tube secured with: Tape Dental Injury: Teeth and Oropharynx as per pre-operative assessment  Difficulty Due To: Difficulty was anticipated, Difficult Airway- due to reduced neck mobility and Difficult Airway- due to limited oral opening

## 2016-12-27 NOTE — Progress Notes (Signed)
Orthopedic Tech Progress Note Patient Details:  Catherine Pope [birth year] [id number]  Ortho Devices Type of Ortho Device: Soft collar Ortho Device/Splint Location: neck Ortho Device/Splint Interventions: Ordered, Application   Braulio Bosch 12/27/2016, 8:37 PM

## 2016-12-27 NOTE — Anesthesia Preprocedure Evaluation (Addendum)
Anesthesia Evaluation  Patient identified by MRN, date of birth, ID band Patient awake    Reviewed: Allergy & Precautions, NPO status , Patient's Chart, lab work & pertinent test results  History of Anesthesia Complications Negative for: history of anesthetic complications  Airway Mallampati: III  TM Distance: <3 FB Neck ROM: Limited    Dental  (+) Poor Dentition, Dental Advisory Given   Pulmonary Current Smoker,    breath sounds clear to auscultation       Cardiovascular hypertension,  Rhythm:Regular Rate:Normal     Neuro/Psych    GI/Hepatic negative GI ROS, Neg liver ROS,   Endo/Other  diabetes, Poorly Controlled, Insulin DependentMorbid obesity  Renal/GU negative Renal ROS     Musculoskeletal   Abdominal (+) + obese,   Peds  Hematology   Anesthesia Other Findings   Reproductive/Obstetrics                           Anesthesia Physical Anesthesia Plan  ASA: III  Anesthesia Plan: General   Post-op Pain Management:    Induction: Intravenous  PONV Risk Score and Plan: 3 and Ondansetron, Dexamethasone, Midazolam and Treatment may vary due to age or medical condition  Airway Management Planned: Oral ETT and Video Laryngoscope Planned  Additional Equipment:   Intra-op Plan:   Post-operative Plan: Extubation in OR  Informed Consent: I have reviewed the patients History and Physical, chart, labs and discussed the procedure including the risks, benefits and alternatives for the proposed anesthesia with the patient or authorized representative who has indicated his/her understanding and acceptance.   Dental advisory given  Plan Discussed with: CRNA  Anesthesia Plan Comments:        Anesthesia Quick Evaluation

## 2016-12-27 NOTE — Transfer of Care (Signed)
Immediate Anesthesia Transfer of Care Note  Patient: Catherine Pope  Procedure(s) Performed: Procedure(s): CERVICAL SIX-SEVEN ANTERIOR CERVICAL DECOMPRESSION/DISCECTOMY FUSION (N/A)  Patient Location: PACU  Anesthesia Type:General  Level of Consciousness: awake, alert , oriented and patient cooperative  Airway & Oxygen Therapy: Patient Spontanous Breathing and Patient connected to nasal cannula oxygen  Post-op Assessment: Report given to RN, Post -op Vital signs reviewed and stable and Patient moving all extremities X 4  Post vital signs: Reviewed and stable  Last Vitals:  Vitals:   12/27/16 1157 12/27/16 1723  BP: (!) 175/102 (!) 142/85  Pulse: (!) 117 86  Resp: 20 16  Temp: (!) 36.3 C (!) 36.1 C  SpO2: 100% 99%    Last Pain:  Vitals:   12/27/16 1723  TempSrc:   PainSc: (P) 8       Patients Stated Pain Goal: 3 (36/46/80 3212)  Complications: No apparent anesthesia complications

## 2016-12-27 NOTE — H&P (Signed)
Patient ID: Catherine Pope, female   DOB: 1965/09/22, 51 y.o.   MRN: [id number] Subjective:   Patient is a 51 y.o. female admitted for acdf. The patient first presented to me with complaints of neck pain, shooting pains in the arm(s) and numbness of the arm(s). Onset of symptoms was several months ago. The pain is described as aching and occurs all day. The pain is rated severe, and is located in the neck and radiates to the arm. The symptoms have been progressive. Symptoms are exacerbated by extending head backwards, and are relieved by none.  Previous work up includes MRI of cervical spine, results: spinal stenosis.  Past Medical History:  Diagnosis Date  . Anxiety   . Depression   . Diabetes mellitus    dx back in 2010  . Hypertension   . Migraine   . Sinusitis     Past Surgical History:  Procedure Laterality Date  . ABDOMINAL HYSTERECTOMY    . ANTERIOR CRUCIATE LIGAMENT REPAIR    . BREAST SURGERY     2 lumps removed at age 62, left breast  . SHOULDER ARTHROSCOPY     right shoulder    No Known Allergies  Social History  Substance Use Topics  . Smoking status: Current Every Day Smoker    Packs/day: 0.50    Years: 15.00    Types: Cigarettes  . Smokeless tobacco: Never Used  . Alcohol use No    Family History  Problem Relation Age of Onset  . Heart Problems Father    Prior to Admission medications   Medication Sig Start Date End Date Taking? Authorizing Provider  buPROPion (ZYBAN) 150 MG 12 hr tablet Take 150 mg by mouth 2 (two) times daily.   Yes [provider]  Cholecalciferol (VITAMIN D PO) Take 50 Units by mouth daily.   Yes [provider]  clonazePAM (KLONOPIN) 0.5 MG tablet Take 1 tablet (0.5 mg total) by mouth 2 (two) times daily as needed for anxiety. Patient taking differently: Take 0.5 mg by mouth 3 (three) times daily as needed for anxiety.  08/08/16  Yes Scot Jun, FNP  cyclobenzaprine (FLEXERIL) 10 MG tablet Take 1 tablet (10 mg total)  by mouth 3 (three) times daily as needed for muscle spasms. 11/21/16  Yes Scot Jun, FNP  gabapentin (NEURONTIN) 300 MG capsule Take 1 capsule (300 mg total) by mouth at bedtime. 10/17/16  Yes Scot Jun, FNP  Garlic 6144 MG CAPS Take 1,000 mg by mouth daily.   Yes [provider]  guaiFENesin (MUCINEX) 600 MG 12 hr tablet Take 1,200 mg by mouth 2 (two) times daily as needed for cough or to loosen phlegm.   Yes [provider]  hydrochlorothiazide (HYDRODIURIL) 12.5 MG tablet Take 1 tablet (12.5 mg total) by mouth daily. 10/17/16  Yes Scot Jun, FNP  insulin glargine (LANTUS) 100 UNIT/ML injection Inject 0.2 mLs (20 Units total) into the skin at bedtime. Patient taking differently: Inject 30 Units into the skin at bedtime.  10/09/16  Yes Jegede, Olugbemiga E, MD  ipratropium (ATROVENT) 0.03 % nasal spray Place 2 sprays into both nostrils 2 (two) times daily. 10/17/16  Yes Scot Jun, FNP  linagliptin (TRADJENTA) 5 MG TABS tablet Take 1 tablet (5 mg total) by mouth daily. 10/23/16  Yes Scot Jun, FNP  lisinopril (PRINIVIL,ZESTRIL) 20 MG tablet Take 1 tablet (20 mg total) by mouth daily. 10/17/16  Yes Scot Jun, FNP  lovastatin (MEVACOR) 20 MG  tablet Take 1 tablet (20 mg total) by mouth at bedtime. 10/17/16  Yes Scot Jun, FNP  metFORMIN (GLUCOPHAGE) 1000 MG tablet 500 mg with breakfast and 1000 mg at dinnertime Patient taking differently: Take 500-1,000 mg by mouth 2 (two) times daily with a meal. 500 mg with breakfast and 1000 mg at dinnertime 10/20/16  Yes Scot Jun, FNP  metoprolol tartrate (LOPRESSOR) 50 MG tablet Take 50 mg by mouth 2 (two) times daily.   Yes [provider]  SUMAtriptan (IMITREX) 50 MG tablet Take 1 tablet (50 mg total) by mouth every 2 (two) hours as needed for migraine. May repeat in 2 hours if headache persists or recurs. 10/17/16  Yes Scot Jun, FNP  venlafaxine XR (EFFEXOR XR) 75  MG 24 hr capsule Take 2 capsules (150 mg total) by mouth daily with breakfast. 10/17/16  Yes Scot Jun, FNP  Blood Glucose Monitoring Suppl (TRUE METRIX AIR GLUCOSE METER) w/Device KIT 1 each by Does not apply route 4 (four) times daily - after meals and at bedtime. 03/23/16   Dorena Dew, FNP  cetirizine (ZYRTEC) 10 MG tablet Take 1 tablet (10 mg total) by mouth daily. 12/25/16   Scot Jun, FNP  diclofenac (VOLTAREN) 50 MG EC tablet TAKE 1 TABLET BY MOUTH 2 TIMES DAILY. 12/21/16   Scot Jun, FNP  fish oil-omega-3 fatty acids 1000 MG capsule Take 2 capsules (2 g total) by mouth daily. 06/22/14   Ernestina Patches, MD  glucose blood (TRUE METRIX BLOOD GLUCOSE TEST) test strip Use as instructed 03/23/16   Dorena Dew, FNP  insulin detemir (LEVEMIR) 100 UNIT/ML injection INJECT 40 UNITS INTO THE SKIN AT BEDTIME. ONE TIME ORDER UNTIL LANTUS GETS APPROVED VIA PASS 10/17/16   Scot Jun, FNP  Insulin Syringe-Needle U-100 (INSULIN SYRINGE 1CC/30GX1/2") 30G X 1/2" 1 ML MISC 1 Syringe by Does not apply route daily. 03/23/16   Dorena Dew, FNP  ketoprofen (ORUDIS) 50 MG capsule Take 1 capsule (50 mg total) by mouth 4 (four) times daily as needed. 04/24/16   Dorena Dew, FNP  Lancets Glory Rosebush ULTRASOFT) lancets Use as instructed 06/22/14   Ernestina Patches, MD  methocarbamol (ROBAXIN) 500 MG tablet TAKE 1 TABLET BY MOUTH 4 TIMES DAILY. 11/23/16   Scot Jun, FNP  SUMAtriptan (IMITREX) 25 MG tablet Take 1 tablet (25 mg total) by mouth once. May repeat in 2 hours if headache persists or recurs. Patient not taking: Reported on 12/14/2016 04/24/16 04/24/16  Dorena Dew, FNP  topiramate (TOPAMAX) 50 MG tablet Take 1 tablet (50 mg total) by mouth 2 (two) times daily. Patient not taking: Reported on 12/25/2016 10/16/16   Scot Jun, FNP  topiramate (TOPAMAX) 50 MG tablet Take 1 tablet (50 mg total) by mouth 2 (two) times daily. 10/16/16 10/16/17  Scot Jun, FNP     Review of Systems  Positive ROS: neg  All other systems have been reviewed and were otherwise negative with the exception of those mentioned in the HPI and as above.  Objective: Vital signs in last 24 hours: Temp:  [97.4 F (36.3 C)] 97.4 F (36.3 C) (09/26 1157) Pulse Rate:  [117] 117 (09/26 1157) Resp:  [20] 20 (09/26 1157) BP: (175)/(102) 175/102 (09/26 1157) SpO2:  [100 %] 100 % (09/26 1157) Weight:  [107.5 kg (237 lb)] 107.5 kg (237 lb) (09/26 1157)  General Appearance: Alert, cooperative, no distress, appears stated age Head: Normocephalic, without obvious abnormality,  atraumatic Eyes: PERRL, conjunctiva/corneas clear, EOM's intact      Neck: Supple, symmetrical, trachea midline, Back: Symmetric, no curvature, ROM normal, no CVA tenderness Lungs:  respirations unlabored Heart: Regular rate and rhythm Abdomen: Soft, non-tender Extremities: Extremities normal, atraumatic, no cyanosis or edema Pulses: 2+ and symmetric all extremities Skin: Skin color, texture, turgor normal, no rashes or lesions  NEUROLOGIC:  Mental status: Alert and oriented x4, no aphasia, good attention span, fund of knowledge and memory  Motor Exam - grossly normal Sensory Exam - grossly normal Reflexes: 1+ Coordination - grossly normal Gait - grossly normal Balance - grossly normal Cranial Nerves: I: smell Not tested  II: visual acuity  OS: nl    OD: nl  II: visual fields Full to confrontation  II: pupils Equal, round, reactive to light  III,VII: ptosis None  III,IV,VI: extraocular muscles  Full ROM  V: mastication Normal  V: facial light touch sensation  Normal  V,VII: corneal reflex  Present  VII: facial muscle function - upper  Normal  VII: facial muscle function - lower Normal  VIII: hearing Not tested  IX: soft palate elevation  Normal  IX,X: gag reflex Present  XI: trapezius strength  5/5  XI: sternocleidomastoid strength 5/5  XI: neck flexion strength  5/5   XII: tongue strength  Normal    Data Review Lab Results  Component Value Date   WBC 10.0 12/18/2016   HGB 13.0 12/18/2016   HCT 38.3 12/18/2016   MCV 87.6 12/18/2016   PLT 241 12/18/2016   Lab Results  Component Value Date   NA 133 (L) 12/18/2016   K 4.0 12/18/2016   CL 102 12/18/2016   CO2 22 12/18/2016   BUN 8 12/18/2016   CREATININE 0.89 12/18/2016   GLUCOSE 340 (H) 12/18/2016   Lab Results  Component Value Date   INR 1.05 12/18/2016    Assessment:   Cervical neck pain with herniated nucleus pulposus/ spondylosis/ stenosis at C6-7. Patient has failed conservative therapy. Planned surgery : ACDF C6-7  Plan:   I explained the condition and procedure to the patient and answered any questions.  Patient wishes to proceed with procedure as planned. Understands risks/ benefits/ and expected or typical outcomes.  Bonnell Placzek S 12/27/2016 12:15 PM

## 2016-12-27 NOTE — Op Note (Signed)
12/27/2016  5:06 PM  PATIENT:  Catherine Pope  51 y.o. female  PRE-OPERATIVE DIAGNOSIS:  Cervical spondylosis C6-7 with neck and B arm pain  POST-OPERATIVE DIAGNOSIS:  same  PROCEDURE:  1. Decompressive anterior cervical discectomy C6-7, 2. Anterior cervical arthrodesis C6-7 utilizing a PTi interbody cage packed with locally harvested morcellized autologous bone graft, 3. Anterior cervical plating C6-7 utilizing a ATEC plate  SURGEON:  Sherley Bounds, MD  ASSISTANTSChristella Noa MD  ANESTHESIA:   General  EBL: 10 ml  Total I/O In: 500 [I.V.:500] Out: 10 [Blood:10]  BLOOD ADMINISTERED: none  DRAINS: none  SPECIMEN:  none  INDICATION FOR PROCEDURE: This patient presented with neck and arm pain. Imaging showed stenosis C6-7. The patient tried conservative measures without relief. Pain was debilitating. Recommended ACDF with plating. Patient understood the risks, benefits, and alternatives and potential outcomes and wished to proceed.  PROCEDURE DETAILS: Patient was brought to the operating room placed under general endotracheal anesthesia. Patient was placed in the supine position on the operating room table. The neck was prepped with Duraprep and draped in a sterile fashion.   Three cc of local anesthesia was injected and a transverse incision was made on the right side of the neck.  Dissection was carried down thru the subcutaneous tissue and the platysma was  elevated, opened, and undermined with Metzenbaum scissors.  Dissection was then carried out thru an avascular plane leaving the sternocleidomastoid carotid artery and jugular vein laterally and the trachea and esophagus medially. The ventral aspect of the vertebral column was identified and a localizing x-ray was taken. The C6-7 level was identified. The longus colli muscles were then elevated and the retractor was placed. The annulus was incised and the disc space entered. Discectomy was performed with micro-curettes and pituitary  rongeurs. I then used the high-speed drill to drill the endplates down to the level of the posterior longitudinal ligament. The drill shavings were saved in a mucous trap for later arthrodesis. The operating microscope was draped and brought into the field provided additional magnification, illumination and visualization. Discectomy was continued posteriorly thru the disc space. Posterior longitudinal ligament was opened with a nerve hook, and then removed along with disc herniation and osteophytes, decompressing the spinal canal and thecal sac. We then continued to remove osteophytic overgrowth and disc material decompressing the neural foramina and exiting nerve roots bilaterally. The scope was angled up and down to help decompress and undercut the vertebral bodies. Once the decompression was completed we could pass a nerve hook circumferentially to assure adequate decompression in the midline and in the neural foramina. So by both visualization and palpation we felt we had an adequate decompression of the neural elements. We then measured the height of the intravertebral disc space and selected a 8 millimeter PTi interbody cage packed with autograft. It was then gently positioned in the intravertebral disc space(s) and countersunk. I then used a 14 mm ATEC plate and placed variable angle screws into the vertebral bodies of each level and locked them into position. The wound was irrigated with bacitracin solution, checked for hemostasis which was established and confirmed. Once meticulous hemostasis was achieved, we then proceeded with closure. The platysma was closed with interrupted 3-0 undyed Vicryl suture, the subcuticular layer was closed with interrupted 3-0 undyed Vicryl suture. The skin edges were approximated with steristrips. The drapes were removed. A sterile dressing was applied. The patient was then awakened from general anesthesia and transferred to the recovery room in stable condition. At  the end  of the procedure all sponge, needle and instrument counts were correct.   PLAN OF CARE: Admit for overnight observation  PATIENT DISPOSITION:  PACU - hemodynamically stable.   Delay start of Pharmacological VTE agent (>24hrs) due to surgical blood loss or risk of bleeding:  yes

## 2016-12-28 ENCOUNTER — Encounter (HOSPITAL_COMMUNITY): Payer: Self-pay | Admitting: Neurological Surgery

## 2016-12-28 DIAGNOSIS — M50223 Other cervical disc displacement at C6-C7 level: Secondary | ICD-10-CM | POA: Diagnosis not present

## 2016-12-28 LAB — GLUCOSE, CAPILLARY: Glucose-Capillary: 248 mg/dL — ABNORMAL HIGH (ref 65–99)

## 2016-12-28 MED ORDER — LIVING WELL WITH DIABETES BOOK
Freq: Once | Status: AC
  Administered 2016-12-28: 10:00:00
  Filled 2016-12-28 (×2): qty 1

## 2016-12-28 MED ORDER — METHOCARBAMOL 500 MG PO TABS: 500.0000 mg | ORAL_TABLET | Freq: Four times a day (QID) | ORAL | 0 refills | Status: DC | PRN

## 2016-12-28 MED ORDER — OXYCODONE HCL 5 MG PO TABS: 5.0000 mg | ORAL_TABLET | ORAL | 0 refills | Status: DC | PRN

## 2016-12-28 NOTE — Progress Notes (Signed)
Inpatient Diabetes Program Recommendations  AACE/ADA: New Consensus Statement on Inpatient Glycemic Control (2015)  Target Ranges:  Prepandial:   less than 140 mg/dL      Peak postprandial:   less than 180 mg/dL (1-2 hours)      Critically ill patients:  140 - 180 mg/dL   Lab Results  Component Value Date   GLUCAP 248 (H) 12/28/2016   HGBA1C 13.4 12/25/2016    Review of Glycemic Control  Diabetes history: DM2  Inpatient Diabetes Program Recommendations:   Spoke with patient @ bedside about A1C 13.4 % (average blood glucose 338 over the past 2-3 months) results with them and explained what an A1C is, basic pathophysiology of DM Type 2, basic home care, basic diabetes diet nutrition principles, importance of checking CBGs and maintaining good CBG control to prevent long-term and short-term complications. Reviewed signs and symptoms of hyperglycemia and hypoglycemia and how to treat hypoglycemia at home. Also reviewed blood sugar goals at home.  RNs to provide ongoing basic DM education at bedside with this patient. Ordered Living Well with Diabetes book. Patient states she has been drinking regular sodas and sweet tea on a regular basis. Patient states willingness to change to drinking sugar free drinks (states she prefers water). Patient states she started taking Lantus insulin 40 units daily 2 months ago.Reviewed Lantus insulin with patient and discussed importance of taking as prescribed. Patient has appt to followup [facility] on Monday.  Will follow during hospitalization.  Thank you, Catherine Pope. Catherine Feeley, RN, MSN, CDE  Diabetes Coordinator Inpatient Glycemic Control Team Team Pager [phone] (8am-5pm) 12/28/2016 9:54 AM

## 2016-12-28 NOTE — Discharge Instructions (Signed)

## 2016-12-28 NOTE — Discharge Summary (Signed)
Physician Discharge Summary  Patient ID: Catherine Pope MRN: [id number] DOB/AGE: 07-14-1965 51 y.o.  Admit date: 12/27/2016 Discharge date: 12/28/2016  Admission Diagnoses:  Cervical spondylosis C6-7 with neck and Bilateral arm pain   Discharge Diagnoses: same   Discharged Condition: good  Hospital Course: The patient was admitted on 12/27/2016 and taken to the operating room where the patient underwent Decompressive anterior cervical discectomy C6-7, 2. Anterior cervical arthrodesis C6-7 . The patient tolerated the procedure well and was taken to the recovery room and then to the floor in stable condition. The hospital course was routine. There were no complications. The wound remained clean dry and intact. Pt had appropriate neck soreness. No complaints of arm pain or new N/T/W. The patient remained afebrile with stable vital signs, and tolerated a regular diet. The patient continued to increase activities, and pain was well controlled with oral pain medications.   Consults: None  Significant Diagnostic Studies:  Results for orders placed or performed during the hospital encounter of 12/27/16  Glucose, capillary  Result Value Ref Range   Glucose-Capillary 228 (H) 65 - 99 mg/dL   Comment 1 Notify RN    Comment 2 Document in Chart   Glucose, capillary  Result Value Ref Range   Glucose-Capillary 167 (H) 65 - 99 mg/dL  Glucose, capillary  Result Value Ref Range   Glucose-Capillary 247 (H) 65 - 99 mg/dL  Glucose, capillary  Result Value Ref Range   Glucose-Capillary 333 (H) 65 - 99 mg/dL   Comment 1 Notify RN    Comment 2 Document in Chart     Chest 2 View  Result Date: 12/18/2016 CLINICAL DATA:  Shortness of breath. EXAM: CHEST  2 VIEW COMPARISON:  Chest x-ray dated June 08, 2005. FINDINGS: The cardiomediastinal silhouette is normal in size. Normal pulmonary vascularity. No focal consolidation, pleural effusion, or pneumothorax. No acute osseous abnormality. IMPRESSION: No active  cardiopulmonary disease. Electronically Signed   By: Titus Dubin M.D.   On: 12/18/2016 11:25   Dg Cervical Spine 1 View  Result Date: 12/27/2016 CLINICAL DATA:  ACDF C6-7. EXAM: OPERATIVE CERVICAL SPINE 1 VIEW(S) COMPARISON:  MRI cervical spine 11/25/2016. Cervical spine x-rays 10/17/2016. Cervical spine CT 09/22/2010. FINDINGS: A single lateral spot image from the C-arm fluoroscopic device is submitted for interpretation postoperatively. Imaging is less than optimal due to inability to penetrate through the shoulders. ACDF with hardware at C6-7. Bone fusion plugs appropriately positioned in the disc space. Anatomic alignment through the C6-7 disc space level. The radiologic technologist documented 16 seconds fluoroscopy time. IMPRESSION: ACDF C6-7. Electronically Signed   By: Evangeline Dakin M.D.   On: 12/27/2016 17:21   Dg C-arm 1-60 Min  Result Date: 12/27/2016 CLINICAL DATA:  ACDF C6-7. EXAM: OPERATIVE CERVICAL SPINE 1 VIEW(S) COMPARISON:  MRI cervical spine 11/25/2016. Cervical spine x-rays 10/17/2016. Cervical spine CT 09/22/2010. FINDINGS: A single lateral spot image from the C-arm fluoroscopic device is submitted for interpretation postoperatively. Imaging is less than optimal due to inability to penetrate through the shoulders. ACDF with hardware at C6-7. Bone fusion plugs appropriately positioned in the disc space. Anatomic alignment through the C6-7 disc space level. The radiologic technologist documented 16 seconds fluoroscopy time. IMPRESSION: ACDF C6-7. Electronically Signed   By: Evangeline Dakin M.D.   On: 12/27/2016 17:21    Antibiotics:  Anti-infectives    Start     Dose/Rate Route Frequency Ordered Stop   12/27/16 2300  ceFAZolin (ANCEF) IVPB 2g/100 mL premix     2  g 200 mL/hr over 30 Minutes Intravenous Every 8 hours 12/27/16 1856 12/28/16 0700   12/27/16 1630  bacitracin 50,000 Units in sodium chloride irrigation 0.9 % 500 mL irrigation  Status:  Discontinued       As  needed 12/27/16 1630 12/27/16 1719   12/27/16 1215  ceFAZolin (ANCEF) IVPB 2g/100 mL premix     2 g 200 mL/hr over 30 Minutes Intravenous On call to O.R. 12/27/16 1208 12/27/16 1608      Discharge Exam: Blood pressure 140/81, pulse 77, temperature (!) 97.5 F (36.4 C), temperature source Oral, resp. rate 20, height '5\' 6"'$  (1.676 m), weight 237 lb (107.5 kg), SpO2 96 %. Neurologic: Grossly normal Ambulating and voiding well  Discharge Medications:   Allergies as of 12/28/2016   No Known Allergies     Medication List    TAKE these medications   buPROPion 150 MG 12 hr tablet Commonly known as:  ZYBAN Take 150 mg by mouth 2 (two) times daily.   cetirizine 10 MG tablet Commonly known as:  ZYRTEC Take 1 tablet (10 mg total) by mouth daily.   clonazePAM 0.5 MG tablet Commonly known as:  KLONOPIN Take 1 tablet (0.5 mg total) by mouth 2 (two) times daily as needed for anxiety. What changed:  when to take this   cyclobenzaprine 10 MG tablet Commonly known as:  FLEXERIL Take 1 tablet (10 mg total) by mouth 3 (three) times daily as needed for muscle spasms.   diclofenac 50 MG EC tablet Commonly known as:  VOLTAREN TAKE 1 TABLET BY MOUTH 2 TIMES DAILY.   fish oil-omega-3 fatty acids 1000 MG capsule Take 2 capsules (2 g total) by mouth daily.   gabapentin 300 MG capsule Commonly known as:  NEURONTIN Take 1 capsule (300 mg total) by mouth at bedtime.   Garlic 4580 MG Caps Take 1,000 mg by mouth daily.   glucose blood test strip Commonly known as:  TRUE METRIX BLOOD GLUCOSE TEST Use as instructed   hydrochlorothiazide 12.5 MG tablet Commonly known as:  HYDRODIURIL Take 1 tablet (12.5 mg total) by mouth daily.   insulin detemir 100 UNIT/ML injection Commonly known as:  LEVEMIR INJECT 40 UNITS INTO THE SKIN AT BEDTIME. ONE TIME ORDER UNTIL LANTUS GETS APPROVED VIA PASS   insulin glargine 100 UNIT/ML injection Commonly known as:  LANTUS Inject 0.2 mLs (20 Units total)  into the skin at bedtime. What changed:  how much to take   INSULIN SYRINGE 1CC/30GX1/2" 30G X 1/2" 1 ML Misc 1 Syringe by Does not apply route daily.   ipratropium 0.03 % nasal spray Commonly known as:  ATROVENT Place 2 sprays into both nostrils 2 (two) times daily.   ketoprofen 50 MG capsule Commonly known as:  ORUDIS Take 1 capsule (50 mg total) by mouth 4 (four) times daily as needed.   linagliptin 5 MG Tabs tablet Commonly known as:  TRADJENTA Take 1 tablet (5 mg total) by mouth daily.   lisinopril 20 MG tablet Commonly known as:  PRINIVIL,ZESTRIL Take 1 tablet (20 mg total) by mouth daily.   lovastatin 20 MG tablet Commonly known as:  MEVACOR Take 1 tablet (20 mg total) by mouth at bedtime.   metFORMIN 1000 MG tablet Commonly known as:  GLUCOPHAGE 500 mg with breakfast and 1000 mg at dinnertime What changed:  how much to take  how to take this  when to take this  additional instructions   methocarbamol 500 MG tablet Commonly known as:  ROBAXIN TAKE 1 TABLET BY MOUTH 4 TIMES DAILY. What changed:  Another medication with the same name was added. Make sure you understand how and when to take each.   methocarbamol 500 MG tablet Commonly known as:  ROBAXIN Take 1 tablet (500 mg total) by mouth every 6 (six) hours as needed for muscle spasms. What changed:  You were already taking a medication with the same name, and this prescription was added. Make sure you understand how and when to take each.   metoprolol tartrate 50 MG tablet Commonly known as:  LOPRESSOR Take 50 mg by mouth 2 (two) times daily.   MUCINEX 600 MG 12 hr tablet Generic drug:  guaiFENesin Take 1,200 mg by mouth 2 (two) times daily as needed for cough or to loosen phlegm.   onetouch ultrasoft lancets Use as instructed   oxyCODONE 5 MG immediate release tablet Commonly known as:  Oxy IR/ROXICODONE Take 1-2 tablets (5-10 mg total) by mouth every 4 (four) hours as needed for moderate pain or  breakthrough pain.   SUMAtriptan 25 MG tablet Commonly known as:  IMITREX Take 1 tablet (25 mg total) by mouth once. May repeat in 2 hours if headache persists or recurs.   SUMAtriptan 50 MG tablet Commonly known as:  IMITREX Take 1 tablet (50 mg total) by mouth every 2 (two) hours as needed for migraine. May repeat in 2 hours if headache persists or recurs.   topiramate 50 MG tablet Commonly known as:  TOPAMAX Take 1 tablet (50 mg total) by mouth 2 (two) times daily.   topiramate 50 MG tablet Commonly known as:  TOPAMAX Take 1 tablet (50 mg total) by mouth 2 (two) times daily.   TRUE METRIX AIR GLUCOSE METER w/Device Kit 1 each by Does not apply route 4 (four) times daily - after meals and at bedtime.   venlafaxine XR 75 MG 24 hr capsule Commonly known as:  EFFEXOR XR Take 2 capsules (150 mg total) by mouth daily with breakfast.   VITAMIN D PO Take 50 Units by mouth daily.            Discharge Care Instructions        Start     Ordered   12/28/16 0000  methocarbamol (ROBAXIN) 500 MG tablet  Every 6 hours PRN     12/28/16 0734   12/28/16 0000  oxyCODONE (OXY IR/ROXICODONE) 5 MG immediate release tablet  Every 4 hours PRN     12/28/16 0734   12/28/16 0000  Increase activity slowly     12/28/16 0734   12/28/16 0000  Diet - low sodium heart healthy     12/28/16 0734   12/28/16 0000  Lifting restrictions    Comments:  Nothing more than 7-8 lbs   12/28/16 0734   12/28/16 0000  Driving Restrictions    Comments:  No driving 2 weeks   83/41/96 0734   12/28/16 0000   Remove dressing in 72 hours     12/28/16 0734   12/28/16 0000  Call MD for:  extreme fatigue     12/28/16 0734   12/28/16 0000  Call MD for:  persistant dizziness or light-headedness     12/28/16 0734   12/28/16 0000  Call MD for:  hives     12/28/16 0734   12/28/16 0000  Call MD for:  difficulty breathing, headache or visual disturbances     12/28/16 0734   12/28/16 0000  Call MD for:  persistant  nausea and vomiting     12/28/16 0734   12/28/16 0000  Call MD for:  temperature >100.4     12/28/16 0734   12/28/16 0000  Call MD for:  severe uncontrolled pain     12/28/16 0734   12/28/16 0000  Call MD for:  redness, tenderness, or signs of infection (pain, swelling, redness, odor or green/yellow discharge around incision site)     12/28/16 0734      Disposition: home   Final Dx: same admitting  Discharge Instructions     Remove dressing in 72 hours    Complete by:  As directed    Call MD for:  difficulty breathing, headache or visual disturbances    Complete by:  As directed    Call MD for:  extreme fatigue    Complete by:  As directed    Call MD for:  hives    Complete by:  As directed    Call MD for:  persistant dizziness or light-headedness    Complete by:  As directed    Call MD for:  persistant nausea and vomiting    Complete by:  As directed    Call MD for:  redness, tenderness, or signs of infection (pain, swelling, redness, odor or green/yellow discharge around incision site)    Complete by:  As directed    Call MD for:  severe uncontrolled pain    Complete by:  As directed    Call MD for:  temperature >100.4    Complete by:  As directed    Diet - low sodium heart healthy    Complete by:  As directed    Driving Restrictions    Complete by:  As directed    No driving 2 weeks   Increase activity slowly    Complete by:  As directed    Lifting restrictions    Complete by:  As directed    Nothing more than 7-8 lbs         Signed: Ocie Cornfield Pauline Pegues 12/28/2016, 7:35 AM

## 2016-12-28 NOTE — Progress Notes (Signed)
Pt doing well. Pt and daughter given D/C instructions with Rx's, verbal understanding was provided. Pt's incision is clean and dry with no sign of infection. Pt's IV was removed prior to D/C. Pt D/C'd home via wheelchair @ 1120 per MD order. Pt is stable @ D/C and has no other needs at this time. Holli Humbles, RN

## 2016-12-28 NOTE — Anesthesia Postprocedure Evaluation (Signed)
Anesthesia Post Note  Patient: Catherine Pope  Procedure(s) Performed: Procedure(s) (LRB): CERVICAL SIX-SEVEN ANTERIOR CERVICAL DECOMPRESSION/DISCECTOMY FUSION (N/A)     Patient location during evaluation: PACU Anesthesia Type: General Level of consciousness: sedated Pain management: pain level controlled Vital Signs Assessment: post-procedure vital signs reviewed and stable Respiratory status: spontaneous breathing and respiratory function stable Cardiovascular status: stable Postop Assessment: no apparent nausea or vomiting Anesthetic complications: no              Tara Rud DANIEL

## 2017-01-16 MED FILL — METHOCARBAMOL 500 MG TABS: 500 | 7 days supply | Qty: 30 | Fill #0

## 2017-01-17 ENCOUNTER — Ambulatory Visit (INDEPENDENT_AMBULATORY_CARE_PROVIDER_SITE_OTHER): Payer: Medicaid Other | Admitting: Family Medicine

## 2017-01-17 ENCOUNTER — Telehealth: Payer: Self-pay

## 2017-01-17 ENCOUNTER — Encounter: Payer: Self-pay | Admitting: Family Medicine

## 2017-01-17 VITALS — BP 138/92 | HR 80 | Temp 98.9°F | Resp 16 | Ht 66.0 in | Wt 238.0 lb

## 2017-01-17 DIAGNOSIS — I10 Essential (primary) hypertension: Secondary | ICD-10-CM

## 2017-01-17 DIAGNOSIS — Z794 Long term (current) use of insulin: Secondary | ICD-10-CM

## 2017-01-17 DIAGNOSIS — F329 Major depressive disorder, single episode, unspecified: Secondary | ICD-10-CM | POA: Diagnosis not present

## 2017-01-17 DIAGNOSIS — F419 Anxiety disorder, unspecified: Secondary | ICD-10-CM

## 2017-01-17 DIAGNOSIS — E118 Type 2 diabetes mellitus with unspecified complications: Secondary | ICD-10-CM | POA: Diagnosis not present

## 2017-01-17 DIAGNOSIS — F32A Depression, unspecified: Secondary | ICD-10-CM

## 2017-01-17 LAB — GLUCOSE, CAPILLARY
Glucose-Capillary: 361 mg/dL — ABNORMAL HIGH (ref 65–99)
Glucose-Capillary: 348 mg/dL — ABNORMAL HIGH (ref 65–99)

## 2017-01-17 LAB — POCT GLYCOSYLATED HEMOGLOBIN (HGB A1C): Hemoglobin A1C: 12.5

## 2017-01-17 MED ORDER — INSULIN DETEMIR 100 UNIT/ML ~~LOC~~ SOLN: SUBCUTANEOUS | 3 refills | Status: DC

## 2017-01-17 MED ORDER — QUETIAPINE FUMARATE 100 MG PO TABS: 100.0000 mg | ORAL_TABLET | Freq: Every day | ORAL | 4 refills | Status: DC

## 2017-01-17 MED ORDER — BUPROPION HCL ER (SR) 150 MG PO TB12: 150.0000 mg | ORAL_TABLET | Freq: Two times a day (BID) | ORAL | 3 refills | Status: DC

## 2017-01-17 MED ORDER — VENLAFAXINE HCL ER 75 MG PO CP24: 150.0000 mg | ORAL_CAPSULE | Freq: Every day | ORAL | 3 refills | Status: DC

## 2017-01-17 MED ORDER — BUSPIRONE HCL 15 MG PO TABS: 15.0000 mg | ORAL_TABLET | Freq: Three times a day (TID) | ORAL | 2 refills | Status: DC

## 2017-01-17 MED ORDER — INJECTION DEVICE FOR INSULIN DEVI
Freq: Once | Status: AC
  Administered 2017-01-17: 10:00:00

## 2017-01-17 MED FILL — LEVEMIR 100 UNITS/ML VIAL: 100 | 25 days supply | Qty: 20 | Fill #0

## 2017-01-17 NOTE — Telephone Encounter (Signed)
Patient notified

## 2017-01-17 NOTE — Patient Instructions (Signed)
Please contact me via phone with the names and dosages of prescriptions prescribed to you by [facility]. I will review and see if these medications are something that I feel comfortable managing here in the primary care setting. I have increased her Levemir to 40 units twice daily. You will administer first thing in the morning and then at bedtime. Your A1c today is uncontrolled at 12.5. An immediate goal is for you to obtain a A1c less than 10. Increase physical activity by walking a minimum of 15-20 minutes 3-5 times weekly with an ultimate goal of physical activity 150 minutes a week. I am attaching a meal plan with your paperwork today please review dietary recommendations in order to facilitate weight loss and better glycemic control. Return in 4 weeks for follow-up.

## 2017-01-17 NOTE — Progress Notes (Signed)
Patient ID: Catherine Pope, female    DOB: [birth year], 51 y.o.   MRN: [id number]  PCP: Scot Jun, FNP  Chief Complaint  Patient presents with  . Follow-up    Subjective:  HPI Catherine Pope is a 51 y.o. female presents for Diabetes management and follow-up . Catherine Pope recently underwent cervical disc surgery. During the presurgical period  blood sugars were uncontrolled and most of her readings average 300-350 . She presents today for diabetes follow-up. She reports recently checking her blood sugars which has averaged between 200-250. She does not routinely check her blood pressure although reports compliance with medication. She has not administered her insulin this morning. She reports no routine physical exercise. She has another complaint regarding her behavioral health management. She is currently at [facility] and is having some difficulty with her medication management and requests medication management here for her depression and anxiety. She reports feeling very uncomfortable with the provider although endorses a good relationship with her mental health counselor and would like to continue counseling services. She is uncertain which medication she is currently prescribed and did not bring a list today. She denies any  active thoughts of suicide or harming others. She also denies chest pain, shortness of breath, headache, or dizziness. Social History   Social History  . Marital status: Single    Spouse name: N/A  . Number of children: 2  . Years of education: 59 th   Occupational History  . unemployed    Social History Main Topics  . Smoking status: Current Every Day Smoker    Packs/day: 0.50    Years: 15.00    Types: Cigarettes  . Smokeless tobacco: Never Used  . Alcohol use No  . Drug use: No  . Sexual activity: Not on file   Other Topics Concern  . Not on file   Social History Narrative   Patient lives at home with her daughters and she is  unemployed. Patient has two children.   Patient has a high school education.   Right handed.    Caffeine two cups daily.    Family History  Problem Relation Age of Onset  . Heart Problems Father    Review of Systems See history of present illness Patient Active Problem List   Diagnosis Date Noted  . Cervical vertebral fusion 12/27/2016  . Essential hypertension 03/23/2016  . Laryngitis, acute 03/23/2016  . Neuropathy 03/23/2016  . Tobacco dependence 03/23/2016  . Diabetes mellitus ([place]) 11/26/2013  . Morbid obesity ([place]) 11/26/2013  . Migraine without aura 11/26/2013  . Other and unspecified hyperlipidemia 11/26/2013    No Known Allergies  Prior to Admission medications   Medication Sig Start Date End Date Taking? Authorizing Provider  Blood Glucose Monitoring Suppl (TRUE METRIX AIR GLUCOSE METER) w/Device KIT 1 each by Does not apply route 4 (four) times daily - after meals and at bedtime. 03/23/16  Yes Dorena Dew, FNP  buPROPion (ZYBAN) 150 MG 12 hr tablet Take 150 mg by mouth 2 (two) times daily.   Yes [provider]  cetirizine (ZYRTEC) 10 MG tablet Take 1 tablet (10 mg total) by mouth daily. 12/25/16  Yes Scot Jun, FNP  Cholecalciferol (VITAMIN D PO) Take 50 Units by mouth daily.   Yes [provider]  clonazePAM (KLONOPIN) 0.5 MG tablet Take 1 tablet (0.5 mg total) by mouth 2 (two) times daily as needed for anxiety. Patient taking differently: Take 0.5 mg by mouth 3 (three) times  daily as needed for anxiety.  08/08/16  Yes Scot Jun, FNP  cyclobenzaprine (FLEXERIL) 10 MG tablet Take 1 tablet (10 mg total) by mouth 3 (three) times daily as needed for muscle spasms. 11/21/16  Yes Scot Jun, FNP  diclofenac (VOLTAREN) 50 MG EC tablet TAKE 1 TABLET BY MOUTH 2 TIMES DAILY. 12/21/16  Yes Scot Jun, FNP  fish oil-omega-3 fatty acids 1000 MG capsule Take 2 capsules (2 g total) by mouth daily. 06/22/14  Yes Ernestina Patches, MD   gabapentin (NEURONTIN) 300 MG capsule Take 1 capsule (300 mg total) by mouth at bedtime. 10/17/16  Yes Scot Jun, FNP  Garlic 8413 MG CAPS Take 1,000 mg by mouth daily.   Yes [provider]  glucose blood (TRUE METRIX BLOOD GLUCOSE TEST) test strip Use as instructed 03/23/16  Yes Dorena Dew, FNP  guaiFENesin (MUCINEX) 600 MG 12 hr tablet Take 1,200 mg by mouth 2 (two) times daily as needed for cough or to loosen phlegm.   Yes [provider]  hydrochlorothiazide (HYDRODIURIL) 12.5 MG tablet Take 1 tablet (12.5 mg total) by mouth daily. 10/17/16  Yes Scot Jun, FNP  insulin detemir (LEVEMIR) 100 UNIT/ML injection INJECT 40 UNITS INTO THE SKIN AT BEDTIME. ONE TIME ORDER UNTIL LANTUS GETS APPROVED VIA PASS 10/17/16  Yes Scot Jun, FNP  insulin glargine (LANTUS) 100 UNIT/ML injection Inject 0.2 mLs (20 Units total) into the skin at bedtime. Patient taking differently: Inject 30 Units into the skin at bedtime.  10/09/16  Yes Jegede, Marlena Clipper, MD  Insulin Syringe-Needle U-100 (INSULIN SYRINGE 1CC/30GX1/2") 30G X 1/2" 1 ML MISC 1 Syringe by Does not apply route daily. 03/23/16  Yes Dorena Dew, FNP  ipratropium (ATROVENT) 0.03 % nasal spray Place 2 sprays into both nostrils 2 (two) times daily. 10/17/16  Yes Scot Jun, FNP  ketoprofen (ORUDIS) 50 MG capsule Take 1 capsule (50 mg total) by mouth 4 (four) times daily as needed. 04/24/16  Yes Dorena Dew, FNP  Lancets Glory Rosebush ULTRASOFT) lancets Use as instructed 06/22/14  Yes Ernestina Patches, MD  linagliptin (TRADJENTA) 5 MG TABS tablet Take 1 tablet (5 mg total) by mouth daily. 10/23/16  Yes Scot Jun, FNP  lisinopril (PRINIVIL,ZESTRIL) 20 MG tablet Take 1 tablet (20 mg total) by mouth daily. 10/17/16  Yes Scot Jun, FNP  lovastatin (MEVACOR) 20 MG tablet Take 1 tablet (20 mg total) by mouth at bedtime. 10/17/16  Yes Scot Jun, FNP  metFORMIN (GLUCOPHAGE) 1000 MG  tablet 500 mg with breakfast and 1000 mg at dinnertime Patient taking differently: Take 500-1,000 mg by mouth 2 (two) times daily with a meal. 500 mg with breakfast and 1000 mg at dinnertime 10/20/16  Yes Scot Jun, FNP  methocarbamol (ROBAXIN) 500 MG tablet TAKE 1 TABLET BY MOUTH 4 TIMES DAILY. 11/23/16  Yes Scot Jun, FNP  methocarbamol (ROBAXIN) 500 MG tablet Take 1 tablet (500 mg total) by mouth every 6 (six) hours as needed for muscle spasms. 12/28/16  Yes Meyran, Ocie Cornfield, NP  metoprolol tartrate (LOPRESSOR) 50 MG tablet Take 50 mg by mouth 2 (two) times daily.   Yes [provider]  oxyCODONE (OXY IR/ROXICODONE) 5 MG immediate release tablet Take 1-2 tablets (5-10 mg total) by mouth every 4 (four) hours as needed for moderate pain or breakthrough pain. 12/28/16  Yes Meyran, Ocie Cornfield, NP  SUMAtriptan (IMITREX) 50 MG tablet Take 1 tablet (50 mg total) by  mouth every 2 (two) hours as needed for migraine. May repeat in 2 hours if headache persists or recurs. 10/17/16  Yes Scot Jun, FNP  topiramate (TOPAMAX) 50 MG tablet Take 1 tablet (50 mg total) by mouth 2 (two) times daily. 10/16/16  Yes Scot Jun, FNP  topiramate (TOPAMAX) 50 MG tablet Take 1 tablet (50 mg total) by mouth 2 (two) times daily. 10/16/16 10/16/17 Yes Scot Jun, FNP  venlafaxine XR (EFFEXOR XR) 75 MG 24 hr capsule Take 2 capsules (150 mg total) by mouth daily with breakfast. 10/17/16  Yes Scot Jun, FNP  SUMAtriptan (IMITREX) 25 MG tablet Take 1 tablet (25 mg total) by mouth once. May repeat in 2 hours if headache persists or recurs. Patient not taking: Reported on 12/14/2016 04/24/16 04/24/16  Dorena Dew, FNP    Past Medical, Surgical Family and Social History reviewed and updated.    Objective:   Today's Vitals   01/17/17 0909  BP: (!) 138/92  Pulse: 80  Resp: 16  Temp: 98.9 F (37.2 C)  TempSrc: Oral  SpO2: 100%  Weight: 238 lb (108 kg)   Height: '5\' 6"'$  (1.676 m)    Wt Readings from Last 3 Encounters:  01/17/17 238 lb (108 kg)  12/27/16 237 lb (107.5 kg)  12/25/16 237 lb (107.5 kg)   Physical Exam  Constitutional: She is oriented to person, place, and time. She appears well-developed and well-nourished.  HENT:  Head: Normocephalic and atraumatic.  Eyes: Pupils are equal, round, and reactive to light. Conjunctivae and EOM are normal.  Neck: Normal range of motion. Neck supple.  Cardiovascular: Normal rate, regular rhythm, normal heart sounds and intact distal pulses.   Pulmonary/Chest: Effort normal and breath sounds normal.  Lymphadenopathy:    She has no cervical adenopathy.  Neurological: She is alert and oriented to person, place, and time.  Skin: Skin is warm and dry.  Psychiatric: She has a normal mood and affect. Her behavior is normal. Judgment and thought content normal.   Assessment & Plan:  1. Type 2 diabetes mellitus with complication, with long-term current use of insulin (HCC), uncontrolled - POCT glycosylated hemoglobin (Hb A1C)-12.5 - injection device for insulin; by Other route once.  2. Essential hypertension, Stable, goal is 130/90 -Continue current medical regimen. BP stable today without taking medication.   3. Anxiety and depression -Patient advised to provide me with a list of medication along with doses. I will review and advise her whether or not she is currently prescribed medications that I agree and is here in the primary care setting.   RTC:  4 weeks for fasting labs and diabetes follow-up.   Carroll Sage. Kenton Kingfisher, MSN, FNP-C [facility]  [address] Barbara Cower [place], [place] [address] [phone]

## 2017-01-17 NOTE — Telephone Encounter (Signed)
[name],  Please contact Ms. Grondin to advise that I will manage those medications for her and send refill request to [facility].  Carroll Sage. Kenton Kingfisher, MSN, FNP-C [facility]  [address] Barbara Cower [place], [place] [address] [phone]

## 2017-01-18 MED FILL — QUETIAPINE FUMARATE 100 MG: 100 | 30 days supply | Qty: 30 | Fill #0

## 2017-01-29 MED FILL — LOVASTATIN 20 MG TABLET: 20 | 30 days supply | Qty: 30 | Fill #3

## 2017-01-29 MED FILL — ALL DAY ALLERGY 10 MG TAB: 10 | 30 days supply | Qty: 30 | Fill #1

## 2017-01-29 MED FILL — GABAPENTIN 300 MG CAPSULE: 300 | 30 days supply | Qty: 30 | Fill #2

## 2017-01-29 MED FILL — TOPIRAMATE 50 MG TABLET: 50 | 30 days supply | Qty: 60 | Fill #3

## 2017-02-07 MED FILL — HYDROCHLOROTHIAZIDE 12.5 MG: 12.5 | 30 days supply | Qty: 30 | Fill #8

## 2017-02-07 MED FILL — CYCLOBENZAPRINE 10 MG TAB: 10 | 30 days supply | Qty: 90 | Fill #0

## 2017-02-07 MED FILL — metFORMIN HCL 500 MG TABS: 500 | 30 days supply | Qty: 90 | Fill #2

## 2017-02-07 MED FILL — LISINOPRIL 20 MG TAB: 20 | 30 days supply | Qty: 30 | Fill #3

## 2017-02-14 ENCOUNTER — Ambulatory Visit: Payer: Medicaid Other | Admitting: Family Medicine

## 2017-02-19 ENCOUNTER — Other Ambulatory Visit: Payer: Self-pay | Admitting: Family Medicine

## 2017-02-19 DIAGNOSIS — E119 Type 2 diabetes mellitus without complications: Secondary | ICD-10-CM

## 2017-02-19 MED FILL — TRADJENTA 5 MG TABLET: 5 | 30 days supply | Qty: 30 | Fill #3

## 2017-02-19 MED FILL — QUETIAPINE FUMARATE 100 MG: 100 | 30 days supply | Qty: 30 | Fill #1

## 2017-02-19 MED FILL — LEVEMIR 100 UNITS/ML VIAL: 100 | 25 days supply | Qty: 20 | Fill #1

## 2017-02-19 MED FILL — TRUEPLUS SYR 0.3ML 30GX5/16: 30G X 5/16" | 30 days supply | Qty: 100 | Fill #0

## 2017-02-28 ENCOUNTER — Encounter: Payer: Self-pay | Admitting: Family Medicine

## 2017-02-28 ENCOUNTER — Ambulatory Visit (INDEPENDENT_AMBULATORY_CARE_PROVIDER_SITE_OTHER): Payer: Medicaid Other | Admitting: Family Medicine

## 2017-02-28 VITALS — BP 140/80 | HR 100 | Temp 98.6°F | Resp 14 | Ht 66.0 in | Wt 240.0 lb

## 2017-02-28 DIAGNOSIS — E119 Type 2 diabetes mellitus without complications: Secondary | ICD-10-CM | POA: Diagnosis not present

## 2017-02-28 DIAGNOSIS — R05 Cough: Secondary | ICD-10-CM

## 2017-02-28 DIAGNOSIS — R059 Cough, unspecified: Secondary | ICD-10-CM

## 2017-02-28 DIAGNOSIS — J019 Acute sinusitis, unspecified: Secondary | ICD-10-CM | POA: Diagnosis not present

## 2017-02-28 LAB — GLUCOSE, CAPILLARY: Glucose-Capillary: 314 mg/dL — ABNORMAL HIGH (ref 65–99)

## 2017-02-28 MED ORDER — AMOXICILLIN-POT CLAVULANATE 875-125 MG PO TABS: 1.0000 | ORAL_TABLET | Freq: Two times a day (BID) | ORAL | 0 refills | Status: DC

## 2017-02-28 MED ORDER — FLUCONAZOLE 150 MG PO TABS: 150.0000 mg | ORAL_TABLET | Freq: Once | ORAL | 0 refills | Status: AC

## 2017-02-28 MED ORDER — PROMETHAZINE-CODEINE 6.25-10 MG/5ML PO SYRP: 7.5000 mL | ORAL_SOLUTION | Freq: Four times a day (QID) | ORAL | 0 refills | Status: DC | PRN

## 2017-02-28 MED FILL — FLUCONAZOLE 150 MG TABLET: 150 | 7 days supply | Qty: 2 | Fill #0

## 2017-02-28 MED FILL — AMOX-CLAV 875-125 MG TABLET: 875-125 | 10 days supply | Qty: 20 | Fill #0

## 2017-02-28 NOTE — Patient Instructions (Signed)

## 2017-02-28 NOTE — Progress Notes (Signed)
Patient ID: Catherine Pope, female    DOB: 1965-12-13, 51 y.o.   MRN: [id number]  PCP: Scot Jun, FNP  Chief Complaint  Patient presents with  . Follow-up  . Cough  . Nasal Congestion    Subjective:  HPI Catherine Pope is a 51 y.o. female with accelerated hypertension and uncontrolled diabetes, presents for evaluation of URI symptoms x 3 weeks. Symptoms include dry non-productive cough, headaches, ear drainage, and persistent post nasal drainage. Reports intermittent wheezing accompanied by shortness of breath. She has not attempted relief with any medication. Denies fever, chest tightness, recent wheezing, nausea, vomiting, or diarrhea. Catherine Pope arrived in office with elevated blood sugar. Repots that she only took her medication and administered her insulin prior to coming into office.  Social History   Socioeconomic History  . Marital status: Single    Spouse name: Not on file  . Number of children: 2  . Years of education: 78 th  . Highest education level: Not on file  Social Needs  . Financial resource strain: Not on file  . Food insecurity - worry: Not on file  . Food insecurity - inability: Not on file  . Transportation needs - medical: Not on file  . Transportation needs - non-medical: Not on file  Occupational History  . Occupation: unemployed  Tobacco Use  . Smoking status: Current Every Day Smoker    Packs/day: 0.50    Years: 15.00    Pack years: 7.50    Types: Cigarettes  . Smokeless tobacco: Never Used  Substance and Sexual Activity  . Alcohol use: No  . Drug use: No  . Sexual activity: Not on file  Other Topics Concern  . Not on file  Social History Narrative   Patient lives at home with her daughters and she is unemployed. Patient has two children.   Patient has a high school education.   Right handed.    Caffeine two cups daily.    Family History  Problem Relation Age of Onset  . Heart Problems Father     Review of Systems   Constitutional: Positive for fatigue.  HENT: Positive for congestion, ear discharge, postnasal drip, rhinorrhea, sinus pressure and sore throat.   Eyes: Negative.   Respiratory: Positive for cough.   Cardiovascular: Negative.   Neurological: Negative.   Hematological: Negative.   Psychiatric/Behavioral: Negative.    . Patient Active Problem List   Diagnosis Date Noted  . Cervical vertebral fusion 12/27/2016  . Essential hypertension 03/23/2016  . Laryngitis, acute 03/23/2016  . Neuropathy 03/23/2016  . Tobacco dependence 03/23/2016  . Diabetes mellitus ([place]) 11/26/2013  . Morbid obesity ([place]) 11/26/2013  . Migraine without aura 11/26/2013  . Other and unspecified hyperlipidemia 11/26/2013    No Known Allergies  Prior to Admission medications   Medication Sig Start Date End Date Taking? Authorizing Provider  Blood Glucose Monitoring Suppl (TRUE METRIX AIR GLUCOSE METER) w/Device KIT 1 each by Does not apply route 4 (four) times daily - after meals and at bedtime. 03/23/16  Yes Dorena Dew, FNP  buPROPion (ZYBAN) 150 MG 12 hr tablet Take 150 mg by mouth 2 (two) times daily.   Yes [provider]  busPIRone (BUSPAR) 15 MG tablet Take 1 tablet (15 mg total) by mouth 3 (three) times daily. 01/17/17  Yes Scot Jun, FNP  cetirizine (ZYRTEC) 10 MG tablet Take 1 tablet (10 mg total) by mouth daily. 12/25/16  Yes Scot Jun, FNP  Cholecalciferol (VITAMIN D  PO) Take 50 Units by mouth daily.   Yes [provider]  clonazePAM (KLONOPIN) 0.5 MG tablet Take 1 tablet (0.5 mg total) by mouth 2 (two) times daily as needed for anxiety. Patient taking differently: Take 0.5 mg by mouth 3 (three) times daily as needed for anxiety.  08/08/16  Yes Scot Jun, FNP  cyclobenzaprine (FLEXERIL) 10 MG tablet Take 1 tablet (10 mg total) by mouth 3 (three) times daily as needed for muscle spasms. 11/21/16  Yes Scot Jun, FNP  diclofenac (VOLTAREN) 50 MG EC  tablet TAKE 1 TABLET BY MOUTH 2 TIMES DAILY. 12/21/16  Yes Scot Jun, FNP  fish oil-omega-3 fatty acids 1000 MG capsule Take 2 capsules (2 g total) by mouth daily. 06/22/14  Yes Ernestina Patches, MD  gabapentin (NEURONTIN) 300 MG capsule Take 1 capsule (300 mg total) by mouth at bedtime. 10/17/16  Yes Scot Jun, FNP  Garlic 1601 MG CAPS Take 1,000 mg by mouth daily.   Yes [provider]  glucose blood (TRUE METRIX BLOOD GLUCOSE TEST) test strip Use as instructed 03/23/16  Yes Dorena Dew, FNP  hydrochlorothiazide (HYDRODIURIL) 12.5 MG tablet Take 1 tablet (12.5 mg total) by mouth daily. 10/17/16  Yes Scot Jun, FNP  insulin detemir (LEVEMIR) 100 UNIT/ML injection INJECT 40 UNITS INTO THE SKIN AT BEDTIME. TWICE DAILY EVERY 12 HOURS. 01/17/17  Yes Scot Jun, FNP  insulin glargine (LANTUS) 100 UNIT/ML injection Inject 0.2 mLs (20 Units total) into the skin at bedtime. Patient taking differently: Inject 30 Units into the skin at bedtime.  10/09/16  Yes Jegede, Olugbemiga E, MD  ipratropium (ATROVENT) 0.03 % nasal spray Place 2 sprays into both nostrils 2 (two) times daily. 10/17/16  Yes Scot Jun, FNP  ketoprofen (ORUDIS) 50 MG capsule Take 1 capsule (50 mg total) by mouth 4 (four) times daily as needed. 04/24/16  Yes Dorena Dew, FNP  Lancets Glory Rosebush ULTRASOFT) lancets Use as instructed 06/22/14  Yes Ernestina Patches, MD  linagliptin (TRADJENTA) 5 MG TABS tablet Take 1 tablet (5 mg total) by mouth daily. 10/23/16  Yes Scot Jun, FNP  lisinopril (PRINIVIL,ZESTRIL) 20 MG tablet Take 1 tablet (20 mg total) by mouth daily. 10/17/16  Yes Scot Jun, FNP  lovastatin (MEVACOR) 20 MG tablet Take 1 tablet (20 mg total) by mouth at bedtime. 10/17/16  Yes Scot Jun, FNP  metFORMIN (GLUCOPHAGE) 1000 MG tablet 500 mg with breakfast and 1000 mg at dinnertime Patient taking differently: Take 500-1,000 mg by mouth 2 (two) times daily with  a meal. 500 mg with breakfast and 1000 mg at dinnertime 10/20/16  Yes Scot Jun, FNP  methocarbamol (ROBAXIN) 500 MG tablet Take 1 tablet (500 mg total) by mouth every 6 (six) hours as needed for muscle spasms. 12/28/16  Yes Meyran, Ocie Cornfield, NP  metoprolol tartrate (LOPRESSOR) 50 MG tablet Take 50 mg by mouth 2 (two) times daily.   Yes [provider]  oxyCODONE (OXY IR/ROXICODONE) 5 MG immediate release tablet Take 1-2 tablets (5-10 mg total) by mouth every 4 (four) hours as needed for moderate pain or breakthrough pain. 12/28/16  Yes Meyran, Ocie Cornfield, NP  QUEtiapine (SEROQUEL) 100 MG tablet Take 1 tablet (100 mg total) by mouth at bedtime. 01/17/17  Yes Scot Jun, FNP  SUMAtriptan (IMITREX) 50 MG tablet Take 1 tablet (50 mg total) by mouth every 2 (two) hours as needed for migraine. May repeat in 2 hours if  headache persists or recurs. 10/17/16  Yes Scot Jun, FNP  topiramate (TOPAMAX) 50 MG tablet Take 1 tablet (50 mg total) by mouth 2 (two) times daily. 10/16/16 10/16/17 Yes Scot Jun, FNP  TRUEPLUS INSULIN SYRINGE 30G X 5/16" 0.3 ML MISC USE AS DIRECTED. 02/19/17  Yes Dorena Dew, FNP  venlafaxine XR (EFFEXOR XR) 75 MG 24 hr capsule Take 2 capsules (150 mg total) by mouth daily with breakfast. 01/17/17  Yes Scot Jun, FNP  buPROPion [facility] SR) 150 MG 12 hr tablet Take 1 tablet (150 mg total) by mouth 2 (two) times daily. Patient not taking: Reported on 02/28/2017 01/17/17   Scot Jun, FNP  guaiFENesin (MUCINEX) 600 MG 12 hr tablet Take 1,200 mg by mouth 2 (two) times daily as needed for cough or to loosen phlegm.    [provider]  methocarbamol (ROBAXIN) 500 MG tablet TAKE 1 TABLET BY MOUTH 4 TIMES DAILY. Patient not taking: Reported on 02/28/2017 11/23/16   Scot Jun, FNP  SUMAtriptan (IMITREX) 25 MG tablet Take 1 tablet (25 mg total) by mouth once. May repeat in 2 hours if headache persists or  recurs. Patient not taking: Reported on 12/14/2016 04/24/16 04/24/16  Dorena Dew, FNP  topiramate (TOPAMAX) 50 MG tablet Take 1 tablet (50 mg total) by mouth 2 (two) times daily. Patient not taking: Reported on 02/28/2017 10/16/16   Scot Jun, FNP    Past Medical, Surgical Family and Social History reviewed and updated.    Objective:   Today's Vitals   02/28/17 1008  BP: 140/80  Pulse: 100  Resp: 14  Temp: 98.6 F (37 C)  TempSrc: Oral  SpO2: 95%  Weight: 240 lb (108.9 kg)  Height: '5\' 6"'$  (1.676 m)    Wt Readings from Last 3 Encounters:  02/28/17 240 lb (108.9 kg)  01/17/17 238 lb (108 kg)  12/27/16 237 lb (107.5 kg)    Physical Exam  Constitutional: She is oriented to person, place, and time. She appears well-developed and well-nourished.  HENT:  Head: Normocephalic and atraumatic.  Right Ear: Hearing, tympanic membrane, external ear and ear canal normal.  Left Ear: Hearing, tympanic membrane, external ear and ear canal normal.  Nose: Mucosal edema and rhinorrhea present.  Mouth/Throat: Uvula is midline. Posterior oropharyngeal erythema present. No oropharyngeal exudate or posterior oropharyngeal edema.  Eyes: Conjunctivae and EOM are normal. Pupils are equal, round, and reactive to light.  Neck: Normal range of motion. No thyromegaly present.  Cardiovascular: Normal rate, regular rhythm, normal heart sounds and intact distal pulses.  Pulmonary/Chest: Effort normal and breath sounds normal.  Musculoskeletal: Normal range of motion.  Lymphadenopathy:    She has no cervical adenopathy.  Neurological: She is alert and oriented to person, place, and time.  Skin: Skin is warm and dry.  Psychiatric: She has a normal mood and affect. Her behavior is normal. Judgment and thought content normal.     Assessment & Plan:  1. Acute sinusitis, recurrence not specified, unspecified location, ongoing x 3 weeks. Will treat empirically with Augmentin x 10 days. Recommended  increasing hydration ,resting and washing hands.  2. Cough, likely secondary to post-nasal-drip, prescribing antitussive medication with codiene   3. Type 2 diabetes mellitus without complication, without long-term current use of insulin ([place]), uncontrolled, current glucose 314. Administered insulin prior to arrival. Patient is monitored monthly for diabetes management.  Meds ordered this encounter  Medications  . amoxicillin-clavulanate (AUGMENTIN) 875-125 MG tablet  Sig: Take 1 tablet by mouth 2 (two) times daily.    Dispense:  20 tablet    Refill:  0    Order Specific Question:   Supervising Provider    Answer:   Tresa Garter [id number]  . fluconazole (DIFLUCAN) 150 MG tablet    Sig: Take 1 tablet (150 mg total) by mouth once for 1 dose. Repeat if needed    Dispense:  2 tablet    Refill:  0    Order Specific Question:   Supervising Provider    Answer:   Tresa Garter [id number]  . promethazine-codeine (PHENERGAN WITH CODEINE) 6.25-10 MG/5ML syrup    Sig: Take 7.5 mLs by mouth every 6 (six) hours as needed for cough.    Dispense:  120 mL    Refill:  0    Order Specific Question:   Supervising Provider    Answer:   Tresa Garter [[id number]]    Orders Placed This Encounter  Procedures  . Glucose, capillary   RTC: 6 weeks for chronic condition management   Carroll Sage. Kenton Kingfisher, MSN, FNP-C [facility]  [address] Barbara Cower [place], [place] [address] [phone]

## 2017-03-05 MED FILL — LOVASTATIN 20 MG TABLET: 20 | 30 days supply | Qty: 30 | Fill #4

## 2017-03-05 MED FILL — GABAPENTIN 300 MG CAPSULE: 300 | 30 days supply | Qty: 30 | Fill #3

## 2017-03-06 MED FILL — metFORMIN HCL 500 MG TABS: 500 | 30 days supply | Qty: 90 | Fill #3

## 2017-03-06 MED FILL — ALL DAY ALLERGY 10 MG TAB: 10 | 30 days supply | Qty: 30 | Fill #2

## 2017-03-06 MED FILL — HYDROCHLOROTHIAZIDE 12.5 MG: 12.5 | 30 days supply | Qty: 30 | Fill #9

## 2017-03-06 MED FILL — LISINOPRIL 20 MG TAB: 20 | 30 days supply | Qty: 30 | Fill #4

## 2017-03-08 ENCOUNTER — Other Ambulatory Visit: Payer: Self-pay | Admitting: Family Medicine

## 2017-03-08 MED FILL — TOPIRAMATE 50 MG TABLET: 50 | 30 days supply | Qty: 60 | Fill #0

## 2017-03-21 ENCOUNTER — Other Ambulatory Visit: Payer: Self-pay | Admitting: Family Medicine

## 2017-03-21 DIAGNOSIS — Z794 Long term (current) use of insulin: Secondary | ICD-10-CM

## 2017-03-21 DIAGNOSIS — E118 Type 2 diabetes mellitus with unspecified complications: Secondary | ICD-10-CM

## 2017-03-21 MED FILL — TRADJENTA 5 MG TABLET: 5 | 30 days supply | Qty: 30 | Fill #0

## 2017-03-21 MED FILL — QUETIAPINE FUMARATE 100 MG: 100 | 30 days supply | Qty: 30 | Fill #2

## 2017-03-21 MED FILL — CYCLOBENZAPRINE 10 MG TAB: 10 | 30 days supply | Qty: 90 | Fill #0

## 2017-03-21 MED FILL — LEVEMIR 100 UNITS/ML VIAL: 100 | 12 days supply | Qty: 10 | Fill #0

## 2017-03-21 MED FILL — VENLAFAXINE HCL ER 75 MG CA: 75 | 30 days supply | Qty: 60 | Fill #3

## 2017-03-23 ENCOUNTER — Encounter: Payer: Self-pay | Admitting: Family Medicine

## 2017-03-23 ENCOUNTER — Ambulatory Visit (INDEPENDENT_AMBULATORY_CARE_PROVIDER_SITE_OTHER): Payer: Medicaid Other | Admitting: Family Medicine

## 2017-03-23 VITALS — BP 138/85 | HR 82 | Temp 98.7°F | Resp 16 | Ht 66.0 in | Wt 240.0 lb

## 2017-03-23 DIAGNOSIS — E118 Type 2 diabetes mellitus with unspecified complications: Secondary | ICD-10-CM | POA: Diagnosis not present

## 2017-03-23 DIAGNOSIS — J029 Acute pharyngitis, unspecified: Secondary | ICD-10-CM

## 2017-03-23 DIAGNOSIS — R490 Dysphonia: Secondary | ICD-10-CM

## 2017-03-23 DIAGNOSIS — Z794 Long term (current) use of insulin: Secondary | ICD-10-CM

## 2017-03-23 DIAGNOSIS — R739 Hyperglycemia, unspecified: Secondary | ICD-10-CM

## 2017-03-23 DIAGNOSIS — R05 Cough: Secondary | ICD-10-CM | POA: Diagnosis not present

## 2017-03-23 DIAGNOSIS — R059 Cough, unspecified: Secondary | ICD-10-CM

## 2017-03-23 LAB — GLUCOSE, CAPILLARY
Glucose-Capillary: 358 mg/dL — ABNORMAL HIGH (ref 65–99)
Glucose-Capillary: 285 mg/dL — ABNORMAL HIGH (ref 65–99)

## 2017-03-23 LAB — POCT URINALYSIS DIP (DEVICE)
Bilirubin Urine: NEGATIVE
Glucose, UA: 500 mg/dL — AB
Hgb urine dipstick: NEGATIVE
Ketones, ur: NEGATIVE mg/dL
Leukocytes, UA: NEGATIVE
Nitrite: NEGATIVE
Protein, ur: NEGATIVE mg/dL
Specific Gravity, Urine: 1.02 (ref 1.005–1.030)
Urobilinogen, UA: 1 mg/dL (ref 0.0–1.0)
pH: 6 (ref 5.0–8.0)

## 2017-03-23 MED ORDER — METHYLPREDNISOLONE SODIUM SUCC 125 MG IJ SOLR
80.0000 mg | Freq: Once | INTRAMUSCULAR | Status: AC
  Administered 2017-03-23: 80 mg via INTRAMUSCULAR

## 2017-03-23 MED ORDER — INJECTION DEVICE FOR INSULIN DEVI
Freq: Once | Status: AC
  Administered 2017-03-23: 11:00:00

## 2017-03-23 MED ORDER — GUAIFENESIN-CODEINE 100-10 MG/5ML PO SYRP: 5.0000 mL | ORAL_SOLUTION | Freq: Three times a day (TID) | ORAL | 0 refills | Status: DC | PRN

## 2017-03-23 MED ORDER — NAPROXEN 500 MG PO TABS: 500.0000 mg | ORAL_TABLET | Freq: Two times a day (BID) | ORAL | 1 refills | Status: DC

## 2017-03-23 MED ORDER — INJECTION DEVICE FOR INSULIN DEVI: Freq: Once | Status: DC

## 2017-03-23 MED ORDER — AZITHROMYCIN 250 MG PO TABS: ORAL_TABLET | ORAL | 0 refills | Status: DC

## 2017-03-23 MED FILL — AZITHROMYCIN 250 MG TABLET: 250 | 5 days supply | Qty: 6 | Fill #0

## 2017-03-23 MED FILL — NAPROXEN 500 MG TABLET: 500 | 30 days supply | Qty: 60 | Fill #0

## 2017-03-23 MED FILL — VIRTUSSIN AC LIQUID: 100-10 | 12 days supply | Qty: 180 | Fill #0

## 2017-03-23 NOTE — Progress Notes (Signed)
Patient ID: Catherine Pope, female    DOB: 1965/10/25, 51 y.o.   MRN: [id number]  PCP: Scot Jun, FNP  Chief Complaint  Patient presents with  . Follow-up    COUGH,SORE THROAT  . Back Pain    Subjective:  HPI Catherine Pope is a 51 y.o. female with poorly controlled type 2 diabetes, medication non-compliance, presents for worsening URI symptoms. Nai was treated for an acute sinus infection on 02/28/2017. Reports completion of antibiotic and symptoms have worsened. Cough has increased in frequency and although remains dry and non-productive. She completely lost her voice over 1 week ago, and has a persistent sore throat. Denies chest tightness. Complains of mild shortness of breath and occasional wheezing. No known fever. Continues to feel very fatigued. Denies previously prescribed cough syrup improved symptoms. She has not attempted relief with any other medication Upon arrival today, Catherine Pope's blood glucose is 358. She reports that her blood sugar has been stable at home, although has no log of readings. Denies polyuria, polyphagia, or polydipsia.   Social History   Socioeconomic History  . Marital status: Single    Spouse name: Not on file  . Number of children: 2  . Years of education: 59 th  . Highest education level: Not on file  Social Needs  . Financial resource strain: Not on file  . Food insecurity - worry: Not on file  . Food insecurity - inability: Not on file  . Transportation needs - medical: Not on file  . Transportation needs - non-medical: Not on file  Occupational History  . Occupation: unemployed  Tobacco Use  . Smoking status: Current Every Day Smoker    Packs/day: 0.50    Years: 15.00    Pack years: 7.50    Types: Cigarettes  . Smokeless tobacco: Never Used  Substance and Sexual Activity  . Alcohol use: No  . Drug use: No  . Sexual activity: Not on file  Other Topics Concern  . Not on file  Social History Narrative   Patient lives at home  with her daughters and she is unemployed. Patient has two children.   Patient has a high school education.   Right handed.    Caffeine two cups daily.    Family History  Problem Relation Age of Onset  . Heart Problems Father     Review of Systems  Constitutional: Positive for fatigue.  HENT: Positive for congestion and sore throat. Negative for ear discharge, ear pain and sinus pain.   Respiratory: Positive for cough, shortness of breath and wheezing. Negative for chest tightness.   Cardiovascular: Negative.   Gastrointestinal: Negative.   Endocrine: Negative.   Genitourinary: Negative.   Musculoskeletal: Negative.   Neurological: Negative for headaches.  Hematological: Negative.   Psychiatric/Behavioral: Negative.    Patient Active Problem List   Diagnosis Date Noted  . Cervical vertebral fusion 12/27/2016  . Essential hypertension 03/23/2016  . Laryngitis, acute 03/23/2016  . Neuropathy 03/23/2016  . Tobacco dependence 03/23/2016  . Diabetes mellitus ([place]) 11/26/2013  . Morbid obesity ([place]) 11/26/2013  . Migraine without aura 11/26/2013  . Other and unspecified hyperlipidemia 11/26/2013    No Known Allergies  Prior to Admission medications   Medication Sig Start Date End Date Taking? Authorizing Provider  Blood Glucose Monitoring Suppl (TRUE METRIX AIR GLUCOSE METER) w/Device KIT 1 each by Does not apply route 4 (four) times daily - after meals and at bedtime. 03/23/16  Yes Dorena Dew, FNP  buPROPion [facility]  SR) 150 MG 12 hr tablet Take 1 tablet (150 mg total) by mouth 2 (two) times daily. 01/17/17  Yes Scot Jun, FNP  busPIRone (BUSPAR) 15 MG tablet Take 1 tablet (15 mg total) by mouth 3 (three) times daily. 01/17/17  Yes Scot Jun, FNP  cetirizine (ZYRTEC) 10 MG tablet Take 1 tablet (10 mg total) by mouth daily. 12/25/16  Yes Scot Jun, FNP  Cholecalciferol (VITAMIN D PO) Take 50 Units by mouth daily.   Yes [provider]   clonazePAM (KLONOPIN) 0.5 MG tablet Take 1 tablet (0.5 mg total) by mouth 2 (two) times daily as needed for anxiety. Patient taking differently: Take 0.5 mg by mouth 3 (three) times daily as needed for anxiety.  08/08/16  Yes Scot Jun, FNP  cyclobenzaprine (FLEXERIL) 10 MG tablet Take 1 tablet (10 mg total) by mouth 3 (three) times daily as needed for muscle spasms. 11/21/16  Yes Scot Jun, FNP  fish oil-omega-3 fatty acids 1000 MG capsule Take 2 capsules (2 g total) by mouth daily. 06/22/14  Yes Ernestina Patches, MD  gabapentin (NEURONTIN) 300 MG capsule Take 1 capsule (300 mg total) by mouth at bedtime. 10/17/16  Yes Scot Jun, FNP  glucose blood (TRUE METRIX BLOOD GLUCOSE TEST) test strip Use as instructed 03/23/16  Yes Dorena Dew, FNP  hydrochlorothiazide (HYDRODIURIL) 12.5 MG tablet Take 1 tablet (12.5 mg total) by mouth daily. 10/17/16  Yes Scot Jun, FNP  insulin detemir (LEVEMIR) 100 UNIT/ML injection INJECT 40 UNITS INTO THE SKIN TWICE DAILY EVERY 12 HOURS 03/21/17  Yes Scot Jun, FNP  ipratropium (ATROVENT) 0.03 % nasal spray Place 2 sprays into both nostrils 2 (two) times daily. 10/17/16  Yes Scot Jun, FNP  Lancets [facility] ULTRASOFT) lancets Use as instructed 06/22/14  Yes Ernestina Patches, MD  methocarbamol (ROBAXIN) 500 MG tablet TAKE 1 TABLET BY MOUTH 4 TIMES DAILY. 11/23/16  Yes Scot Jun, FNP  metoprolol tartrate (LOPRESSOR) 50 MG tablet Take 50 mg by mouth 2 (two) times daily.   Yes [provider]  pravastatin (PRAVACHOL) 40 MG tablet Take 40 mg by mouth daily. 04/17/14  Yes [provider]  propranolol ER (INDERAL LA) 80 MG 24 hr capsule Take 80 mg by mouth daily. 04/17/14  Yes [provider]  QUEtiapine (SEROQUEL) 100 MG tablet Take 1 tablet (100 mg total) by mouth at bedtime. 01/17/17  Yes Scot Jun, FNP  SUMAtriptan (IMITREX) 50 MG tablet Take 1 tablet (50 mg total) by mouth every  2 (two) hours as needed for migraine. May repeat in 2 hours if headache persists or recurs. 10/17/16  Yes Scot Jun, FNP  topiramate (TOPAMAX) 50 MG tablet TAKE 1 TABLET (50 MG TOTAL) BY MOUTH 2 (TWO) TIMES DAILY. 03/08/17  Yes Scot Jun, FNP  TRADJENTA 5 MG TABS tablet TAKE 1 TABLET BY MOUTH DAILY. 03/21/17  Yes Scot Jun, FNP  TRUEPLUS INSULIN SYRINGE 30G X 5/16" 0.3 ML MISC USE AS DIRECTED. 02/19/17  Yes Dorena Dew, FNP  amoxicillin-clavulanate (AUGMENTIN) 875-125 MG tablet Take 1 tablet by mouth 2 (two) times daily. Patient not taking: Reported on 03/23/2017 02/28/17   Scot Jun, FNP  buPROPion (ZYBAN) 150 MG 12 hr tablet Take 150 mg by mouth 2 (two) times daily.    [provider]  cyclobenzaprine (FLEXERIL) 10 MG tablet TAKE 1 TABLET BY MOUTH 3 TIMES DAILY AS NEEDED (NECK PAIN). Patient not taking: Reported  on 03/23/2017 03/21/17   Scot Jun, FNP  diclofenac (VOLTAREN) 50 MG EC tablet TAKE 1 TABLET BY MOUTH 2 TIMES DAILY. Patient not taking: Reported on 03/23/2017 12/21/16   Scot Jun, FNP  Garlic 4665 MG CAPS Take 1,000 mg by mouth daily.    [provider]  insulin glargine (LANTUS) 100 UNIT/ML injection Inject 0.2 mLs (20 Units total) into the skin at bedtime. Patient not taking: Reported on 03/23/2017 10/09/16   Tresa Garter, MD  ketoprofen (ORUDIS) 50 MG capsule Take 1 capsule (50 mg total) by mouth 4 (four) times daily as needed. 04/24/16   Dorena Dew, FNP  lisinopril (PRINIVIL,ZESTRIL) 20 MG tablet Take 1 tablet (20 mg total) by mouth daily. 10/17/16   Scot Jun, FNP  lovastatin (MEVACOR) 20 MG tablet Take 1 tablet (20 mg total) by mouth at bedtime. 10/17/16   Scot Jun, FNP  metFORMIN (GLUCOPHAGE) 1000 MG tablet 500 mg with breakfast and 1000 mg at dinnertime Patient taking differently: Take 500-1,000 mg by mouth 2 (two) times daily with a meal. 500 mg with breakfast and 1000 mg at  dinnertime 10/20/16   Scot Jun, FNP  oxyCODONE (OXY IR/ROXICODONE) 5 MG immediate release tablet Take 1-2 tablets (5-10 mg total) by mouth every 4 (four) hours as needed for moderate pain or breakthrough pain. Patient not taking: Reported on 03/23/2017 12/28/16   Meyran, Ocie Cornfield, NP  promethazine-codeine (PHENERGAN WITH CODEINE) 6.25-10 MG/5ML syrup Take 7.5 mLs by mouth every 6 (six) hours as needed for cough. Patient not taking: Reported on 03/23/2017 02/28/17   Scot Jun, FNP  SUMAtriptan (IMITREX) 25 MG tablet Take 1 tablet (25 mg total) by mouth once. May repeat in 2 hours if headache persists or recurs. Patient not taking: Reported on 12/14/2016 04/24/16 04/24/16  Dorena Dew, FNP  venlafaxine XR (EFFEXOR XR) 75 MG 24 hr capsule Take 2 capsules (150 mg total) by mouth daily with breakfast. 01/17/17   Scot Jun, FNP    Past Medical, Surgical Family and Social History reviewed and updated.    Objective:   Today's Vitals   03/23/17 1038  BP: 138/85  Pulse: 82  Resp: 16  Temp: 98.7 F (37.1 C)  TempSrc: Oral  SpO2: 99%  Weight: 240 lb (108.9 kg)  Height: '5\' 6"'$  (1.676 m)    Wt Readings from Last 3 Encounters:  03/23/17 240 lb (108.9 kg)  02/28/17 240 lb (108.9 kg)  01/17/17 238 lb (108 kg)   Physical Exam  Constitutional: She is oriented to person, place, and time. She appears ill.  HENT:  Head: Normocephalic and atraumatic.  Right Ear: Hearing, tympanic membrane, external ear and ear canal normal.  Left Ear: Hearing, tympanic membrane, external ear and ear canal normal.  Nose: Mucosal edema present.  Mouth/Throat: Uvula is midline. No oropharyngeal exudate, posterior oropharyngeal edema or posterior oropharyngeal erythema.  Eyes: Conjunctivae and EOM are normal. Pupils are equal, round, and reactive to light.  Neck: Normal range of motion. Neck supple. No thyromegaly present.  Cardiovascular: Normal rate, regular rhythm and normal  heart sounds.  Pulmonary/Chest: Effort normal. She has no wheezes. She has rhonchi. She has no rales.  Abdominal: Soft. Bowel sounds are normal.  Musculoskeletal: Normal range of motion.  Lymphadenopathy:    She has no cervical adenopathy.  Neurological: She is alert and oriented to person, place, and time.  Skin: Skin is warm and dry.  Psychiatric: She has a normal mood  and affect. Her behavior is normal. Judgment and thought content normal.   Assessment & Plan:  1. Hyperglycemia, glucose on arrival 358. 10 units of Novolog, subcutaneously, ordered.  Oral hydration with water. Repeat glucose 285.  2. Pharyngitis, unspecified etiology, symptoms consistent  URI illness has remained present greater than 4 weeks. Patient previously treated with antibiotic 3 weeks prior, however due to medication non-compliance and uncontrolled diabetes, will extend course of antibiotics. Start Azithromycin Take 2 tabs x 1 dose, then 1 tab every day for x 4 days.  3. Type 2 diabetes mellitus with complication, with long-term current use of insulin ([place]), uncontrolled. Educated regarding the importance of diabetes management and the correlation with elevated blood sugar and delayed resolution of illness.     4. Cough, persistent for more than 5 weeks. No improvement with antitussive medications. Will obtain a chest x-ray today to rule out any underlying pulmonary etiology. Patient is current everyday smoker.   5. Hoarseness of voice, unknown etiology.  Negative cervical adenopathy. Likely secondary to pharyngitis.  Administering one dose of solumedrol for antiinflammatory action. Deferred from prescribing prednisone taper due to uncontrolled blood sugar.  Meds ordered this encounter  Medications  . DISCONTD: injection device for insulin  . injection device for insulin  . azithromycin (ZITHROMAX) 250 MG tablet    Sig: Take 2 tabs PO x 1 dose, then 1 tab PO QD x 4 days    Dispense:  6 tablet    Refill:  0     Order Specific Question:   Supervising Provider    Answer:   Tresa Garter [id number]  . guaiFENesin-codeine (ROBITUSSIN AC) 100-10 MG/5ML syrup    Sig: Take 5 mLs by mouth 3 (three) times daily as needed for cough.    Dispense:  180 mL    Refill:  0    Order Specific Question:   Supervising Provider    Answer:   Tresa Garter [id number]  . naproxen (NAPROSYN) 500 MG tablet    Sig: Take 1 tablet (500 mg total) by mouth 2 (two) times daily with a meal.    Dispense:  60 tablet    Refill:  1    Order Specific Question:   Supervising Provider    Answer:   Tresa Garter [id number]  . methylPREDNISolone sodium succinate (SOLU-MEDROL) 125 mg/2 mL injection 80 mg     Orders Placed This Encounter  Procedures  . DG Chest 2 View  . Glucose, capillary  . Glucose, capillary  . POCT urinalysis dip (device)   RTC: 4 weeks for diabetes follow-up.   Carroll Sage. Kenton Kingfisher, MSN, FNP-C [facility]  [address] [place], [place] [address] [phone]

## 2017-04-05 MED FILL — CETIRIZINE HCL 10 MG TABS: 10 | 30 days supply | Qty: 30 | Fill #3

## 2017-04-05 MED FILL — GABAPENTIN 300 MG CAPSULE: 300 | 30 days supply | Qty: 30 | Fill #4

## 2017-04-05 MED FILL — LOVASTATIN 20 MG TABLET: 20 | 30 days supply | Qty: 30 | Fill #5

## 2017-04-05 MED FILL — LEVEMIR 100 UNITS/ML VIAL: 100 | 12 days supply | Qty: 10 | Fill #1

## 2017-04-18 ENCOUNTER — Encounter: Payer: Self-pay | Admitting: Family Medicine

## 2017-04-18 ENCOUNTER — Ambulatory Visit (INDEPENDENT_AMBULATORY_CARE_PROVIDER_SITE_OTHER): Payer: Medicaid Other | Admitting: Family Medicine

## 2017-04-18 VITALS — BP 144/90 | HR 92 | Temp 98.3°F | Resp 16 | Ht 66.0 in | Wt 241.0 lb

## 2017-04-18 DIAGNOSIS — M25512 Pain in left shoulder: Secondary | ICD-10-CM | POA: Diagnosis not present

## 2017-04-18 DIAGNOSIS — R490 Dysphonia: Secondary | ICD-10-CM

## 2017-04-18 MED ORDER — MELOXICAM 15 MG PO TABS: 15.0000 mg | ORAL_TABLET | Freq: Every day | ORAL | 0 refills | Status: DC

## 2017-04-18 MED FILL — MELOXICAM 15 MG TABLET: 15 | 30 days supply | Qty: 30 | Fill #0

## 2017-04-18 NOTE — Progress Notes (Signed)
Patient ID: Catherine Pope, female    DOB: Jul 27, 1965, 52 y.o.   MRN: [id number]  PCP: Scot Jun, FNP  Chief Complaint  Patient presents with  . Follow-up    Hoarseness    Subjective:  HPI Catherine Pope is a 52 y.o. female presents for evaluation of persistent hoarseness. This has been an ongoing problem with episodic improvement however no true resolution of natural voice quality for last few months. initally symptoms were thought to related to chronic allergies and a subsequent sinus infection, however hoarseness continued in spite of treatment for both conditions. Initial onset around the end of July, and has worsened over the course of the last 2 months. She is not experiencing dysphagia, sore throat, airway obstruction, or increased saliva production. Reports associated increased throat dryness for which she is treating with large volumes of water intake. No surgery history in which tonsils or adenoids were removed. Reports as second problem of left shoulder tenderness and swelling upon awakening. No injuries and no signs of insect bite. Continues to have full ROM. Has not attempted any NSAIDS or warm compresses to relieve symptoms. Social History   Socioeconomic History  . Marital status: Single    Spouse name: Not on file  . Number of children: 2  . Years of education: 18 th  . Highest education level: Not on file  Social Needs  . Financial resource strain: Not on file  . Food insecurity - worry: Not on file  . Food insecurity - inability: Not on file  . Transportation needs - medical: Not on file  . Transportation needs - non-medical: Not on file  Occupational History  . Occupation: unemployed  Tobacco Use  . Smoking status: Current Every Day Smoker    Packs/day: 0.50    Years: 15.00    Pack years: 7.50    Types: Cigarettes  . Smokeless tobacco: Never Used  Substance and Sexual Activity  . Alcohol use: No  . Drug use: No  . Sexual activity: Not on file    Other Topics Concern  . Not on file  Social History Narrative   Patient lives at home with her daughters and she is unemployed. Patient has two children.   Patient has a high school education.   Right handed.    Caffeine two cups daily.    Family History  Problem Relation Age of Onset  . Heart Problems Father    Review of Systems  HENT: Positive for voice change.   Musculoskeletal: Positive for arthralgias.    Patient Active Problem List   Diagnosis Date Noted  . Cervical vertebral fusion 12/27/2016  . Essential hypertension 03/23/2016  . Laryngitis, acute 03/23/2016  . Neuropathy 03/23/2016  . Tobacco dependence 03/23/2016  . Diabetes mellitus ([place]) 11/26/2013  . Morbid obesity ([place]) 11/26/2013  . Migraine without aura 11/26/2013  . Other and unspecified hyperlipidemia 11/26/2013    No Known Allergies  Prior to Admission medications   Medication Sig Start Date End Date Taking? Authorizing Provider  Blood Glucose Monitoring Suppl (TRUE METRIX AIR GLUCOSE METER) w/Device KIT 1 each by Does not apply route 4 (four) times daily - after meals and at bedtime. 03/23/16  Yes Dorena Dew, FNP  buPROPion (WELLBUTRIN SR) 150 MG 12 hr tablet Take 1 tablet (150 mg total) by mouth 2 (two) times daily. 01/17/17  Yes Scot Jun, FNP  buPROPion (ZYBAN) 150 MG 12 hr tablet Take 150 mg by mouth 2 (two) times daily.  Yes [provider]  busPIRone (BUSPAR) 15 MG tablet Take 1 tablet (15 mg total) by mouth 3 (three) times daily. 01/17/17  Yes Scot Jun, FNP  cetirizine (ZYRTEC) 10 MG tablet Take 1 tablet (10 mg total) by mouth daily. 12/25/16  Yes Scot Jun, FNP  Cholecalciferol (VITAMIN D PO) Take 50 Units by mouth daily.   Yes [provider]  clonazePAM (KLONOPIN) 0.5 MG tablet Take 1 tablet (0.5 mg total) by mouth 2 (two) times daily as needed for anxiety. Patient taking differently: Take 0.5 mg by mouth 3 (three) times daily as needed for  anxiety.  08/08/16  Yes Scot Jun, FNP  cyclobenzaprine (FLEXERIL) 10 MG tablet Take 1 tablet (10 mg total) by mouth 3 (three) times daily as needed for muscle spasms. 11/21/16  Yes Scot Jun, FNP  diclofenac (VOLTAREN) 50 MG EC tablet TAKE 1 TABLET BY MOUTH 2 TIMES DAILY. 12/21/16  Yes Scot Jun, FNP  fish oil-omega-3 fatty acids 1000 MG capsule Take 2 capsules (2 g total) by mouth daily. 06/22/14  Yes Ernestina Patches, MD  gabapentin (NEURONTIN) 300 MG capsule Take 1 capsule (300 mg total) by mouth at bedtime. 10/17/16  Yes Scot Jun, FNP  Garlic 1638 MG CAPS Take 1,000 mg by mouth daily.   Yes [provider]  glucose blood (TRUE METRIX BLOOD GLUCOSE TEST) test strip Use as instructed 03/23/16  Yes Dorena Dew, FNP  hydrochlorothiazide (HYDRODIURIL) 12.5 MG tablet Take 1 tablet (12.5 mg total) by mouth daily. 10/17/16  Yes Scot Jun, FNP  insulin detemir (LEVEMIR) 100 UNIT/ML injection INJECT 40 UNITS INTO THE SKIN TWICE DAILY EVERY 12 HOURS 03/21/17  Yes Scot Jun, FNP  insulin glargine (LANTUS) 100 UNIT/ML injection Inject 0.2 mLs (20 Units total) into the skin at bedtime. 10/09/16  Yes Jegede, Olugbemiga E, MD  ipratropium (ATROVENT) 0.03 % nasal spray Place 2 sprays into both nostrils 2 (two) times daily. 10/17/16  Yes Scot Jun, FNP  ketoprofen (ORUDIS) 50 MG capsule Take 1 capsule (50 mg total) by mouth 4 (four) times daily as needed. 04/24/16  Yes Dorena Dew, FNP  Lancets [facility] ULTRASOFT) lancets Use as instructed 06/22/14  Yes Ernestina Patches, MD  lisinopril (PRINIVIL,ZESTRIL) 20 MG tablet Take 1 tablet (20 mg total) by mouth daily. 10/17/16  Yes Scot Jun, FNP  lovastatin (MEVACOR) 20 MG tablet Take 1 tablet (20 mg total) by mouth at bedtime. 10/17/16  Yes Scot Jun, FNP  metFORMIN (GLUCOPHAGE) 1000 MG tablet 500 mg with breakfast and 1000 mg at dinnertime Patient taking differently: Take 500-1,000  mg by mouth 2 (two) times daily with a meal. 500 mg with breakfast and 1000 mg at dinnertime 10/20/16  Yes Scot Jun, FNP  methocarbamol (ROBAXIN) 500 MG tablet TAKE 1 TABLET BY MOUTH 4 TIMES DAILY. 11/23/16  Yes Scot Jun, FNP  metoprolol tartrate (LOPRESSOR) 50 MG tablet Take 50 mg by mouth 2 (two) times daily.   Yes [provider]  naproxen (NAPROSYN) 500 MG tablet Take 1 tablet (500 mg total) by mouth 2 (two) times daily with a meal. 03/23/17  Yes Scot Jun, FNP  oxyCODONE (OXY IR/ROXICODONE) 5 MG immediate release tablet Take 1-2 tablets (5-10 mg total) by mouth every 4 (four) hours as needed for moderate pain or breakthrough pain. 12/28/16  Yes Meyran, Ocie Cornfield, NP  pravastatin (PRAVACHOL) 40 MG tablet Take 40 mg by mouth daily. 04/17/14  Yes [provider]  propranolol ER (INDERAL LA) 80 MG 24 hr capsule Take 80 mg by mouth daily. 04/17/14  Yes [provider]  QUEtiapine (SEROQUEL) 100 MG tablet Take 1 tablet (100 mg total) by mouth at bedtime. 01/17/17  Yes Scot Jun, FNP  SUMAtriptan (IMITREX) 50 MG tablet Take 1 tablet (50 mg total) by mouth every 2 (two) hours as needed for migraine. May repeat in 2 hours if headache persists or recurs. 10/17/16  Yes Scot Jun, FNP  topiramate (TOPAMAX) 50 MG tablet TAKE 1 TABLET (50 MG TOTAL) BY MOUTH 2 (TWO) TIMES DAILY. 03/08/17  Yes Scot Jun, FNP  TRADJENTA 5 MG TABS tablet TAKE 1 TABLET BY MOUTH DAILY. 03/21/17  Yes Scot Jun, FNP  TRUEPLUS INSULIN SYRINGE 30G X 5/16" 0.3 ML MISC USE AS DIRECTED. 02/19/17  Yes Dorena Dew, FNP  venlafaxine XR (EFFEXOR XR) 75 MG 24 hr capsule Take 2 capsules (150 mg total) by mouth daily with breakfast. 01/17/17  Yes Scot Jun, FNP  guaiFENesin-codeine (ROBITUSSIN AC) 100-10 MG/5ML syrup Take 5 mLs by mouth 3 (three) times daily as needed for cough. Patient not taking: Reported on 04/18/2017 03/23/17   Scot Jun, FNP  promethazine-codeine [facility] WITH CODEINE) 6.25-10 MG/5ML syrup Take 7.5 mLs by mouth every 6 (six) hours as needed for cough. Patient not taking: Reported on 03/23/2017 02/28/17   Scot Jun, FNP  SUMAtriptan (IMITREX) 25 MG tablet Take 1 tablet (25 mg total) by mouth once. May repeat in 2 hours if headache persists or recurs. Patient not taking: Reported on 12/14/2016 04/24/16 04/24/16  Dorena Dew, FNP    Past Medical, Surgical Family and Social History reviewed and updated.    Objective:   Today's Vitals   04/18/17 1001  BP: (!) 144/90  Pulse: 92  Resp: 16  Temp: 98.3 F (36.8 C)  TempSrc: Oral  SpO2: 100%  Weight: 241 lb (109.3 kg)  Height: _0  (1.676 m)    Wt Readings from Last 3 Encounters:  04/18/17 241 lb (109.3 kg)  03/23/17 240 lb (108.9 kg)  02/28/17 240 lb (108.9 kg)    Physical Exam  Constitutional: She appears well-developed and well-nourished.  HENT:  Head: Normocephalic and atraumatic.  Nose: Nose normal.  Mouth/Throat: Oropharynx is clear and moist.  Voice character is a whisper  Eyes: Conjunctivae and EOM are normal. Pupils are equal, round, and reactive to light.  Cardiovascular: Normal rate, regular rhythm, normal heart sounds and intact distal pulses.  Pulmonary/Chest: Effort normal and breath sounds normal.  Musculoskeletal: She exhibits edema and tenderness.       Arms: Neurological: She is alert.  Skin: Skin is warm and dry.  Psychiatric: She has a normal mood and affect. Her behavior is normal. Judgment and thought content normal.   Assessment & Plan:  1. Hoarseness, chronic problem. Unrelieved with treatment of allergies and recent sinusitis infection. Patient is a long-time smoker-no family history of throat or oral cancer. Negative history of GERD. Will refer to ENT for further evaluation of symptoms.  2. Acute pain of left shoulder, likely secondary inflammation. I will trial Meloxicam once daily.     Meds ordered this encounter  Medications  . meloxicam (MOBIC) 15 MG tablet    Sig: Take 1 tablet (15 mg total) by mouth daily.    Dispense:  30 tablet    Refill:  0    Order Specific Question:   Supervising Provider  AnswerTresa Garter [[id number]]    Orders Placed This Encounter  Procedures  . Ambulatory referral to ENT    RTC: Keep scheduled follow-up.  Carroll Sage. Kenton Kingfisher, MSN, FNP-C [facility]  [address] [place], [place] [address] [phone]

## 2017-04-23 ENCOUNTER — Telehealth: Payer: Self-pay

## 2017-04-23 ENCOUNTER — Other Ambulatory Visit: Payer: Self-pay | Admitting: Family Medicine

## 2017-04-23 MED FILL — TOPIRAMATE 50 MG TABLET: 50 | 30 days supply | Qty: 60 | Fill #1

## 2017-04-23 MED FILL — CYCLOBENZAPRINE 10 MG TAB: 10 | 30 days supply | Qty: 90 | Fill #0

## 2017-04-23 MED FILL — LISINOPRIL 20 MG TAB: 20 | 30 days supply | Qty: 30 | Fill #5

## 2017-04-23 MED FILL — HYDROCHLOROTHIAZIDE 12.5 MG: 12.5 | 30 days supply | Qty: 30 | Fill #1

## 2017-04-23 MED FILL — LEVEMIR 100 UNITS/ML VIAL: 100 | 12 days supply | Qty: 10 | Fill #2

## 2017-04-23 MED FILL — QUETIAPINE FUMARATE 100 MG: 100 | 30 days supply | Qty: 30 | Fill #3

## 2017-04-23 NOTE — Telephone Encounter (Signed)
-----   Message from Scot Jun, [place] sent at 04/22/2017  6:35 PM EST ----- Uncertain if ENT referrals are worked from the box. Could you follow-up on this referrals.

## 2017-04-23 NOTE — Telephone Encounter (Signed)
Referral was just processed and faxed today. I had to wait until note was complete

## 2017-04-24 NOTE — Telephone Encounter (Signed)
Patient would like a referral to Ralie[facility] Ip for arm pain.

## 2017-04-25 NOTE — Telephone Encounter (Signed)
Referral placed for orthopedics surgery Dr. Raliegh Ip.   Carroll Sage. Kenton Kingfisher, MSN, FNP-C [facility]  [address] [place], [place] [address] [phone]

## 2017-04-25 NOTE — Addendum Note (Signed)
Addended by: Scot Jun on: 04/25/2017 12:53 AM   Modules accepted: Orders

## 2017-04-25 NOTE — Telephone Encounter (Signed)
Referral faxed

## 2017-04-27 ENCOUNTER — Encounter (HOSPITAL_COMMUNITY): Payer: Self-pay | Admitting: Family Medicine

## 2017-04-27 ENCOUNTER — Ambulatory Visit (HOSPITAL_COMMUNITY)
Admission: EM | Admit: 2017-04-27 | Discharge: 2017-04-27 | Disposition: A | Discharge status: Home / Self Care | Payer: Medicaid Other | Attending: Family Medicine | Admitting: Family Medicine

## 2017-04-27 DIAGNOSIS — M7062 Trochanteric bursitis, left hip: Secondary | ICD-10-CM | POA: Diagnosis not present

## 2017-04-27 DIAGNOSIS — M75102 Unspecified rotator cuff tear or rupture of left shoulder, not specified as traumatic: Secondary | ICD-10-CM | POA: Diagnosis not present

## 2017-04-27 MED ORDER — DICLOFENAC SODIUM 3 % TD GEL: 1.0000 "application " | Freq: Three times a day (TID) | TRANSDERMAL | 2 refills | Status: DC | PRN

## 2017-04-27 NOTE — ED Provider Notes (Signed)
[place]   [id number] 04/27/17 Arrival Time: 1001   SUBJECTIVE:  Catherine Pope is a 52 y.o. female who presents to the urgent care with complaint of left shoulder and left hip pain.  The left shoulder pain began after doing some hygiene with her daughter and she felt a pop 5 days ago.  Now she has pain with abduction.  She also notes some tenderness in the anterior joint line.  The left hip pain began 5 days ago as well.  She did not have a fall.  The pain began spontaneously.    Past Medical History:  Diagnosis Date  . Anxiety   . Depression   . Diabetes mellitus    dx back in 2010  . Hypertension   . Migraine   . Sinusitis    Family History  Problem Relation Age of Onset  . Heart Problems Father    Social History   Socioeconomic History  . Marital status: Single    Spouse name: Not on file  . Number of children: 2  . Years of education: 22 th  . Highest education level: Not on file  Social Needs  . Financial resource strain: Not on file  . Food insecurity - worry: Not on file  . Food insecurity - inability: Not on file  . Transportation needs - medical: Not on file  . Transportation needs - non-medical: Not on file  Occupational History  . Occupation: unemployed  Tobacco Use  . Smoking status: Current Every Day Smoker    Packs/day: 0.50    Years: 15.00    Pack years: 7.50    Types: Cigarettes  . Smokeless tobacco: Never Used  Substance and Sexual Activity  . Alcohol use: No  . Drug use: No  . Sexual activity: Not on file  Other Topics Concern  . Not on file  Social History Narrative   Patient lives at home with her daughters and she is unemployed. Patient has two children.   Patient has a high school education.   Right handed.    Caffeine two cups daily.   Current Meds  Medication Sig  . buPROPion (WELLBUTRIN SR) 150 MG 12 hr tablet Take 1 tablet (150 mg total) by mouth 2 (two) times daily.  Marland Kitchen buPROPion (ZYBAN) 150 MG 12 hr tablet  Take 150 mg by mouth 2 (two) times daily.  . busPIRone (BUSPAR) 15 MG tablet Take 1 tablet (15 mg total) by mouth 3 (three) times daily.  . cetirizine (ZYRTEC) 10 MG tablet Take 1 tablet (10 mg total) by mouth daily.  . Cholecalciferol (VITAMIN D PO) Take 50 Units by mouth daily.  . diclofenac (VOLTAREN) 50 MG EC tablet TAKE 1 TABLET BY MOUTH 2 TIMES DAILY.  . fish oil-omega-3 fatty acids 1000 MG capsule Take 2 capsules (2 g total) by mouth daily.  Marland Kitchen gabapentin (NEURONTIN) 300 MG capsule Take 1 capsule (300 mg total) by mouth at bedtime.  . Garlic 2423 MG CAPS Take 1,000 mg by mouth daily.  Marland Kitchen glucose blood (TRUE METRIX BLOOD GLUCOSE TEST) test strip Use as instructed  . hydrochlorothiazide (HYDRODIURIL) 12.5 MG tablet Take 1 tablet (12.5 mg total) by mouth daily.  . insulin detemir (LEVEMIR) 100 UNIT/ML injection INJECT 40 UNITS INTO THE SKIN TWICE DAILY EVERY 12 HOURS  . insulin glargine (LANTUS) 100 UNIT/ML injection Inject 0.2 mLs (20 Units total) into the skin at bedtime.  . Lancets (ONETOUCH ULTRASOFT) lancets Use as instructed  . lisinopril (PRINIVIL,ZESTRIL) 20 MG  tablet Take 1 tablet (20 mg total) by mouth daily.  Marland Kitchen lovastatin (MEVACOR) 20 MG tablet Take 1 tablet (20 mg total) by mouth at bedtime.  . meloxicam (MOBIC) 15 MG tablet Take 1 tablet (15 mg total) by mouth daily.  . metFORMIN (GLUCOPHAGE) 1000 MG tablet 500 mg with breakfast and 1000 mg at dinnertime (Patient taking differently: Take 500-1,000 mg by mouth 2 (two) times daily with a meal. 500 mg with breakfast and 1000 mg at dinnertime)  . metoprolol tartrate (LOPRESSOR) 50 MG tablet Take 50 mg by mouth 2 (two) times daily.  . naproxen (NAPROSYN) 500 MG tablet Take 1 tablet (500 mg total) by mouth 2 (two) times daily with a meal.  . propranolol ER (INDERAL LA) 80 MG 24 hr capsule Take 80 mg by mouth daily.  . QUEtiapine (SEROQUEL) 100 MG tablet Take 1 tablet (100 mg total) by mouth at bedtime.  . topiramate (TOPAMAX) 50 MG  tablet TAKE 1 TABLET (50 MG TOTAL) BY MOUTH 2 (TWO) TIMES DAILY.  Marland Kitchen TRADJENTA 5 MG TABS tablet TAKE 1 TABLET BY MOUTH DAILY.  Marland Kitchen TRUEPLUS INSULIN SYRINGE 30G X 5/16" 0.3 ML MISC USE AS DIRECTED.  Marland Kitchen venlafaxine XR (EFFEXOR XR) 75 MG 24 hr capsule Take 2 capsules (150 mg total) by mouth daily with breakfast.  . [DISCONTINUED] SUMAtriptan (IMITREX) 50 MG tablet Take 1 tablet (50 mg total) by mouth every 2 (two) hours as needed for migraine. May repeat in 2 hours if headache persists or recurs.   No Known Allergies    ROS: As per HPI, remainder of ROS negative.   OBJECTIVE:   Vitals:   04/27/17 1009  BP: (!) 158/90  Pulse: (!) 110  Resp: 20  Temp: 98.5 F (36.9 C)  TempSrc: Oral  SpO2: 98%     General appearance: alert; no distress Eyes: PERRL; EOMI; conjunctiva normal HENT: normocephalic; atraumatic;  Neck: supple Back: no CVA tenderness Extremities: no cyanosis or edema; symmetrical with no gross deformities; full range of motion of left shoulder with tenderness in the anterior joint line and pain with Abduction. Left hip ROM is normal with pain on internal and external rotation. Tender over left trochanter Skin: warm and dry Neurologic: normal gait; grossly normal Psychological: alert and cooperative; normal mood and affect      Labs:  Results for orders placed or performed in visit on 03/23/17  Glucose, capillary  Result Value Ref Range   Glucose-Capillary 358 (H) 65 - 99 mg/dL  Glucose, capillary  Result Value Ref Range   Glucose-Capillary 285 (H) 65 - 99 mg/dL  POCT urinalysis dip (device)  Result Value Ref Range   Glucose, UA 500 (A) NEGATIVE mg/dL   Bilirubin Urine NEGATIVE NEGATIVE   Ketones, ur NEGATIVE NEGATIVE mg/dL   Specific Gravity, Urine 1.020 1.005 - 1.030   Hgb urine dipstick NEGATIVE NEGATIVE   pH 6.0 5.0 - 8.0   Protein, ur NEGATIVE NEGATIVE mg/dL   Urobilinogen, UA 1.0 0.0 - 1.0 mg/dL   Nitrite NEGATIVE NEGATIVE   Leukocytes, UA NEGATIVE  NEGATIVE    Labs Reviewed - No data to display  No results found.     ASSESSMENT & PLAN:  1. Trochanteric bursitis of left hip   2. Rotator cuff syndrome of left shoulder     Meds ordered this encounter  Medications  . Diclofenac Sodium 3 % GEL    Sig: Place 1 application onto the skin 3 (three) times daily as needed.    Dispense:  100 g    Refill:  2    Reviewed expectations re: course of current medical issues. Questions answered. Outlined signs and symptoms indicating need for more acute intervention. Patient verbalized understanding. After Visit Summary given.  Do the range of motion exercises I showed you and apply the cream to the left hip three times a day  Follow up with your doctor to find ways of reducing your Jamal Maes, MD 04/27/17 1029

## 2017-04-27 NOTE — ED Triage Notes (Signed)
PT C/O: left hip and left arm pain onset 1 week.... sts she heard a pop in her left arm when she was doing her daughters hair  TAKING MEDS: Aleve  A&O x4... NAD... Ambulatory

## 2017-04-27 NOTE — Discharge Instructions (Signed)
Do the range of motion exercises I showed you and apply the cream to the left hip three times a day  Follow up with your doctor to find ways of reducing your medications

## 2017-04-30 DIAGNOSIS — J3089 Other allergic rhinitis: Secondary | ICD-10-CM | POA: Insufficient documentation

## 2017-04-30 DIAGNOSIS — K219 Gastro-esophageal reflux disease without esophagitis: Secondary | ICD-10-CM | POA: Insufficient documentation

## 2017-04-30 DIAGNOSIS — J324 Chronic pansinusitis: Secondary | ICD-10-CM | POA: Insufficient documentation

## 2017-04-30 DIAGNOSIS — R490 Dysphonia: Secondary | ICD-10-CM | POA: Insufficient documentation

## 2017-05-01 ENCOUNTER — Other Ambulatory Visit: Payer: Self-pay | Admitting: Sports Medicine

## 2017-05-01 DIAGNOSIS — M545 Low back pain: Secondary | ICD-10-CM

## 2017-05-08 ENCOUNTER — Other Ambulatory Visit: Payer: Self-pay | Admitting: Family Medicine

## 2017-05-08 MED FILL — VENLAFAXINE HCL ER 75 MG CA: 75 | 30 days supply | Qty: 60 | Fill #0

## 2017-05-08 MED FILL — NAPROXEN 500 MG TABLET: 500 | 30 days supply | Qty: 60 | Fill #1

## 2017-05-08 MED FILL — metFORMIN HCL 500 MG TABS: 500 | 30 days supply | Qty: 90 | Fill #4

## 2017-05-08 MED FILL — CETIRIZINE HCL 10 MG TABS: 10 | 30 days supply | Qty: 30 | Fill #4

## 2017-05-08 MED FILL — GABAPENTIN 300 MG CAPSULE: 300 | 30 days supply | Qty: 30 | Fill #5

## 2017-05-08 MED FILL — TRADJENTA 5 MG TABLET: 5 | 30 days supply | Qty: 30 | Fill #1

## 2017-05-08 MED FILL — LOVASTATIN 20 MG TABLET: 20 | 30 days supply | Qty: 30 | Fill #6

## 2017-05-10 MED FILL — LEVEMIR 100 UNITS/ML VIAL: 100 | 12 days supply | Qty: 10 | Fill #3

## 2017-05-12 ENCOUNTER — Other Ambulatory Visit: Payer: Medicaid Other

## 2017-05-24 ENCOUNTER — Other Ambulatory Visit: Payer: Self-pay | Admitting: Family Medicine

## 2017-05-24 MED FILL — HYDROCHLOROTHIAZIDE 12.5 MG: 12.5 | 30 days supply | Qty: 30 | Fill #2

## 2017-05-24 MED FILL — TOPIRAMATE 50 MG TABLET: 50 | 30 days supply | Qty: 60 | Fill #2

## 2017-05-24 MED FILL — CYCLOBENZAPRINE 10 MG TAB: 10 | 30 days supply | Qty: 90 | Fill #0

## 2017-05-24 MED FILL — LISINOPRIL 20 MG TAB: 20 | 30 days supply | Qty: 30 | Fill #6

## 2017-05-24 MED FILL — QUETIAPINE FUMARATE 100 MG: 100 | 30 days supply | Qty: 30 | Fill #4

## 2017-06-02 ENCOUNTER — Ambulatory Visit
Admission: RE | Admit: 2017-06-02 | Discharge: 2017-06-02 | Disposition: A | Discharge status: Home / Self Care | Payer: Medicaid Other | Source: Ambulatory Visit | Attending: Sports Medicine | Admitting: Sports Medicine

## 2017-06-02 DIAGNOSIS — M545 Low back pain: Secondary | ICD-10-CM

## 2017-06-04 ENCOUNTER — Other Ambulatory Visit: Payer: Self-pay | Admitting: Family Medicine

## 2017-06-04 DIAGNOSIS — E118 Type 2 diabetes mellitus with unspecified complications: Secondary | ICD-10-CM

## 2017-06-04 DIAGNOSIS — Z794 Long term (current) use of insulin: Secondary | ICD-10-CM

## 2017-06-05 MED FILL — LEVEMIR 100 UNITS/ML VIAL: 100 | 12 days supply | Qty: 10 | Fill #0

## 2017-06-11 MED FILL — LOVASTATIN 20 MG TABS: 20 | 30 days supply | Qty: 30 | Fill #7

## 2017-06-11 MED FILL — GABAPENTIN 300 MG CAPSULE: 300 | 30 days supply | Qty: 30 | Fill #1

## 2017-06-13 MED FILL — VENLAFAXINE HCL ER 75 MG CA: 75 | 30 days supply | Qty: 60 | Fill #1

## 2017-06-13 MED FILL — metFORMIN HCL 500 MG TABS: 500 | 30 days supply | Qty: 90 | Fill #5

## 2017-06-13 MED FILL — CETIRIZINE HCL 10 MG TABS: 10 | 30 days supply | Qty: 30 | Fill #5

## 2017-06-13 MED FILL — TRADJENTA 5 MG TABLET: 5 | 30 days supply | Qty: 30 | Fill #2

## 2017-06-18 ENCOUNTER — Ambulatory Visit: Payer: Medicaid Other | Admitting: Family Medicine

## 2017-06-18 ENCOUNTER — Encounter: Payer: Self-pay | Admitting: Family Medicine

## 2017-06-18 ENCOUNTER — Other Ambulatory Visit: Payer: Self-pay

## 2017-06-18 ENCOUNTER — Ambulatory Visit (INDEPENDENT_AMBULATORY_CARE_PROVIDER_SITE_OTHER): Payer: Medicaid Other | Admitting: Family Medicine

## 2017-06-18 VITALS — BP 140/75 | HR 102 | Temp 97.7°F | Ht 66.0 in | Wt 246.0 lb

## 2017-06-18 DIAGNOSIS — F329 Major depressive disorder, single episode, unspecified: Secondary | ICD-10-CM

## 2017-06-18 DIAGNOSIS — Z794 Long term (current) use of insulin: Secondary | ICD-10-CM

## 2017-06-18 DIAGNOSIS — F32A Depression, unspecified: Secondary | ICD-10-CM

## 2017-06-18 DIAGNOSIS — M5442 Lumbago with sciatica, left side: Secondary | ICD-10-CM

## 2017-06-18 DIAGNOSIS — I10 Essential (primary) hypertension: Secondary | ICD-10-CM

## 2017-06-18 DIAGNOSIS — E118 Type 2 diabetes mellitus with unspecified complications: Secondary | ICD-10-CM | POA: Diagnosis not present

## 2017-06-18 DIAGNOSIS — G8929 Other chronic pain: Secondary | ICD-10-CM

## 2017-06-18 DIAGNOSIS — M5441 Lumbago with sciatica, right side: Secondary | ICD-10-CM

## 2017-06-18 DIAGNOSIS — F419 Anxiety disorder, unspecified: Secondary | ICD-10-CM

## 2017-06-18 LAB — POCT URINALYSIS DIP (DEVICE)
Bilirubin Urine: NEGATIVE
Glucose, UA: >=1000 mg/dL — AB
Hgb urine dipstick: NEGATIVE
Ketones, ur: NEGATIVE mg/dL
Leukocytes, UA: NEGATIVE
Nitrite: NEGATIVE
Protein, ur: NEGATIVE mg/dL
Specific Gravity, Urine: 1.02 (ref 1.005–1.030)
Urobilinogen, UA: 0.2 mg/dL (ref 0.0–1.0)
pH: 6.5 (ref 5.0–8.0)

## 2017-06-18 LAB — POCT GLYCOSYLATED HEMOGLOBIN (HGB A1C): Hemoglobin A1C: 14.2

## 2017-06-18 LAB — GLUCOSE, POCT (MANUAL RESULT ENTRY): POC Glucose: 347 mg/dl — AB (ref 70–99)

## 2017-06-18 MED ORDER — INSULIN DETEMIR 100 UNIT/ML ~~LOC~~ SOLN: SUBCUTANEOUS | 3 refills | Status: DC

## 2017-06-18 MED ORDER — INSULIN NPH (HUMAN) (ISOPHANE) 100 UNIT/ML ~~LOC~~ SUSP
20.0000 [IU] | Freq: Once | SUBCUTANEOUS | Status: AC
  Administered 2017-06-18: 20 [IU] via SUBCUTANEOUS

## 2017-06-18 MED ORDER — BUSPIRONE HCL 15 MG PO TABS: ORAL_TABLET | ORAL | 2 refills | Status: DC

## 2017-06-18 MED ORDER — VENLAFAXINE HCL ER 75 MG PO CP24: 150.0000 mg | ORAL_CAPSULE | Freq: Every day | ORAL | 3 refills | Status: DC

## 2017-06-18 MED ORDER — INJECTION DEVICE FOR INSULIN DEVI: Freq: Once | Status: DC

## 2017-06-18 MED FILL — LEVEMIR 100 UNITS/ML VIAL: 100 | 10 days supply | Qty: 10 | Fill #0

## 2017-06-18 NOTE — Progress Notes (Signed)
[place]  Patient ID: Catherine Pope, female    DOB: [birth year], 52 y.o.   MRN: [id number]  PCP: Scot Jun, FNP  Chief Complaint  Patient presents with  . Follow-up    Subjective:  HPI Catherine Pope is a 52 y.o. female with uncontrolled type 2 diabetes, hypertension, depression, GA, and chronic back pain. [name] recently was evaluated for chronic back pain by Dr. Cherre Huger office and was found to have mild bulging of disc of lower back. At present she is opting for conservative therapy oppose to back surgery and is scheduled to follow-up with with their office on 06/29/2017. Catherine Pope initially reports adherence with diabetes regimen. She has struggled to maintain control of blood sugar secondary to medication non-compliance. Last A1C 12.5. Repeat A1C today 14.5, Catherine Pope admits to skipping doses of insulin. Reports no home monitoring of blood sugar and continues to eat regular diet. No home BP monitoring although reports she is taking medications.Catherine Pope suffers from depression and anxiety and reports that her overall mood has been up and down. She denies suicidal or homicial thoughts. Within the last 2 week she has had a few crying episode, however attributes these to actual stressful personal situations oppose to crying without cause. Denies cough, shortness of breath, chest pain, dizziness, or new weakness. Social History   Socioeconomic History  . Marital status: Single    Spouse name: Not on file  . Number of children: 2  . Years of education: 66 th  . Highest education level: Not on file  Social Needs  . Financial resource strain: Not on file  . Food insecurity - worry: Not on file  . Food insecurity - inability: Not on file  . Transportation needs - medical: Not on file  . Transportation needs - non-medical: Not on file  Occupational History  . Occupation: unemployed  Tobacco Use  . Smoking status: Current Every Day Smoker    Packs/day: 0.50    Years: 15.00    Pack years:  7.50    Types: Cigarettes  . Smokeless tobacco: Never Used  Substance and Sexual Activity  . Alcohol use: No  . Drug use: No  . Sexual activity: Not on file  Other Topics Concern  . Not on file  Social History Narrative   Patient lives at home with her daughters and she is unemployed. Patient has two children.   Patient has a high school education.   Right handed.    Caffeine two cups daily.    Family History  Problem Relation Age of Onset  . Heart Problems Father    Review of Systems  Pertinent negatives listed in HPI Patient Active Problem List   Diagnosis Date Noted  . Cervical vertebral fusion 12/27/2016  . Essential hypertension 03/23/2016  . Laryngitis, acute 03/23/2016  . Neuropathy 03/23/2016  . Tobacco dependence 03/23/2016  . Diabetes mellitus ([place]) 11/26/2013  . Morbid obesity ([place]) 11/26/2013  . Migraine without aura 11/26/2013  . Other and unspecified hyperlipidemia 11/26/2013    No Known Allergies  Prior to Admission medications   Medication Sig Start Date End Date Taking? Authorizing Provider  buPROPion (WELLBUTRIN SR) 150 MG 12 hr tablet Take 1 tablet (150 mg total) by mouth 2 (two) times daily. 01/17/17  Yes Scot Jun, FNP  busPIRone (BUSPAR) 15 MG tablet TAKE 1 TABLET(15 MG) BY MOUTH THREE TIMES DAILY 05/08/17  Yes Scot Jun, FNP  cetirizine (ZYRTEC) 10 MG tablet Take 1 tablet (10 mg total) by mouth daily.  12/25/16  Yes ,  S, FNP  cyclobenzaprine (FLEXERIL) 10 MG tablet Take 1 tablet (10 mg total) by mouth 3 (three) times daily as needed for muscle spasms. 11/21/16  Yes ,  S, FNP  Diclofenac Sodium 3 % GEL Place 1 application onto the skin 3 (three) times daily as needed. 04/27/17  Yes Lauenstein, Kurt, MD  gabapentin (NEURONTIN) 300 MG capsule Take 1 capsule (300 mg total) by mouth at bedtime. 10/17/16  Yes ,  S, FNP  hydrochlorothiazide (HYDRODIURIL) 12.5 MG tablet Take 1 tablet (12.5 mg total) by  mouth daily. 10/17/16  Yes ,  S, FNP  insulin detemir (LEVEMIR) 100 UNIT/ML injection INJECT 40 UNITS INTO THE SKIN TWICE DAILY EVERY 12 HOURS 06/05/17  Yes ,  S, FNP  ipratropium (ATROVENT) 0.03 % nasal spray Place 2 sprays into both nostrils 2 (two) times daily. 10/17/16  Yes ,  S, FNP  lisinopril (PRINIVIL,ZESTRIL) 20 MG tablet Take 1 tablet (20 mg total) by mouth daily. 10/17/16  Yes ,  S, FNP  lovastatin (MEVACOR) 20 MG tablet Take 1 tablet (20 mg total) by mouth at bedtime. 10/17/16  Yes ,  S, FNP  metFORMIN (GLUCOPHAGE) 1000 MG tablet 500 mg with breakfast and 1000 mg at dinnertime Patient taking differently: Take 500-1,000 mg by mouth 2 (two) times daily with a meal. 500 mg with breakfast and 1000 mg at dinnertime 10/20/16  Yes ,  S, FNP  QUEtiapine (SEROQUEL) 100 MG tablet Take 1 tablet (100 mg total) by mouth at bedtime. 01/17/17  Yes ,  S, FNP  topiramate (TOPAMAX) 50 MG tablet TAKE 1 TABLET (50 MG TOTAL) BY MOUTH 2 (TWO) TIMES DAILY. 03/08/17  Yes ,  S, FNP  TRADJENTA 5 MG TABS tablet TAKE 1 TABLET BY MOUTH DAILY. 03/21/17  Yes ,  S, FNP  venlafaxine XR (EFFEXOR-XR) 75 MG 24 hr capsule TAKE 2 CAPSULES (150 MG TOTAL) BY MOUTH DAILY WITH BREAKFAST. 05/08/17  Yes ,  S, FNP  Blood Glucose Monitoring Suppl (TRUE METRIX AIR GLUCOSE METER) w/Device KIT 1 each by Does not apply route 4 (four) times daily - after meals and at bedtime. 03/23/16   Hollis, Lachina M, FNP  buPROPion (ZYBAN) 150 MG 12 hr tablet Take 150 mg by mouth 2 (two) times daily.    [provider]  Cholecalciferol (VITAMIN D PO) Take 50 Units by mouth daily.    [provider]  clonazePAM (KLONOPIN) 0.5 MG tablet Take 1 tablet (0.5 mg total) by mouth 2 (two) times daily as needed for anxiety. Patient not taking: Reported on 06/18/2017 08/08/16   ,  S, FNP  cyclobenzaprine  (FLEXERIL) 10 MG tablet TAKE 1 TABLET BY MOUTH 3 TIMES DAILY AS NEEDED (NECK PAIN). 05/24/17   ,  S, FNP  diclofenac (VOLTAREN) 50 MG EC tablet TAKE 1 TABLET BY MOUTH 2 TIMES DAILY. Patient not taking: Reported on 06/18/2017 12/21/16   ,  S, FNP  fish oil-omega-3 fatty acids 1000 MG capsule Take 2 capsules (2 g total) by mouth daily. Patient not taking: Reported on 06/18/2017 06/22/14   Docherty, Megan, MD  Garlic 1000 MG CAPS Take 1,000 mg by mouth daily.    [provider]  glucose blood (TRUE METRIX BLOOD GLUCOSE TEST) test strip Use as instructed 03/23/16   Hollis, Lachina M, FNP  insulin glargine (LANTUS) 100 UNIT/ML injection Inject 0.2 mLs (20 Units total) into the skin at bedtime. Patient not taking: Reported on 06/18/2017 10/09/16   Jegede,   Olugbemiga E, MD  ketoprofen (ORUDIS) 50 MG capsule Take 1 capsule (50 mg total) by mouth 4 (four) times daily as needed. Patient not taking: Reported on 06/18/2017 04/24/16   Hollis, Lachina M, FNP  Lancets (ONETOUCH ULTRASOFT) lancets Use as instructed 06/22/14   Docherty, Megan, MD  meloxicam (MOBIC) 15 MG tablet Take 1 tablet (15 mg total) by mouth daily. 04/18/17   ,  S, FNP  methocarbamol (ROBAXIN) 500 MG tablet TAKE 1 TABLET BY MOUTH 4 TIMES DAILY. Patient not taking: Reported on 06/18/2017 11/23/16   ,  S, FNP  metoprolol tartrate (LOPRESSOR) 50 MG tablet Take 50 mg by mouth 2 (two) times daily.    [provider]  naproxen (NAPROSYN) 500 MG tablet Take 1 tablet (500 mg total) by mouth 2 (two) times daily with a meal. 03/23/17   ,  S, FNP  pravastatin (PRAVACHOL) 40 MG tablet Take 40 mg by mouth daily. 04/17/14   [provider]  propranolol ER (INDERAL LA) 80 MG 24 hr capsule Take 80 mg by mouth daily. 04/17/14   [provider]  TRUEPLUS INSULIN SYRINGE 30G X 5/16" 0.3 ML MISC USE AS DIRECTED. 02/19/17   Hollis, Lachina M, FNP  venlafaxine XR (EFFEXOR  XR) 75 MG 24 hr capsule Take 2 capsules (150 mg total) by mouth daily with breakfast. 01/17/17   ,  S, FNP    Past Medical, Surgical Family and Social History reviewed and updated.    Objective:   Today's Vitals   06/18/17 1036  BP: 140/75  Pulse: (!) 102  Temp: 97.7 F (36.5 C)  TempSrc: Oral  SpO2: 100%  Weight: 246 lb (111.6 kg)  Height: 5' 6" (1.676 m)  PainSc: 10-Worst pain ever  PainLoc: Back    Wt Readings from Last 3 Encounters:  06/18/17 246 lb (111.6 kg)  04/18/17 241 lb (109.3 kg)  03/23/17 240 lb (108.9 kg)    Physical Exam  Constitutional: She is oriented to person, place, and time. She appears well-developed and well-nourished.  HENT:  Head: Normocephalic and atraumatic.  Nose: Nose normal.  Mouth/Throat: No oropharyngeal exudate.  Eyes: Pupils are equal, round, and reactive to light. Conjunctivae and EOM are normal.  Neck: Normal range of motion. Neck supple.  Cardiovascular: Normal rate, regular rhythm, normal heart sounds and intact distal pulses.  Pulmonary/Chest: Effort normal and breath sounds normal.  Lymphadenopathy:    She has no cervical adenopathy.  Neurological: She is alert and oriented to person, place, and time.  Skin: Skin is warm and dry.  Psychiatric: She has a normal mood and affect. Her behavior is normal. Judgment and thought content normal.   Assessment & Plan:  1. Type 2 diabetes mellitus with complication, with long-term current use of insulin (HCC), A1C 14.5, worsening. Patient given  detailed education on the adverse effects of poor diabetes control on overall health and increases risk of end organ damage. Increasing Levemir 50 units at bedtime. Referring patient to diabetes education. Glucose today 347. Administer Novolin  injection 20 units, subcutaneous.  2. Essential hypertension, stable. We have discussed target BP range and blood pressure goal. I have advised patient to check BP regularly and to call us back  or report to clinic if the numbers are consistently higher than 140/90. We discussed the importance of compliance with medical therapy and DASH diet recommended, consequences of uncontrolled hypertension discussed.  Continue current BP medications  3. Chronic bilateral low back pain with bilateral sciatica, continue follow-up with   Dr. Wainer's office. Will check  Vitamin D 25 Hydroxy (Vit-D Deficiency, Fractures).  4. Anxiety and depression, active at present, although patient reports control of symptoms. No additional medication changes today.    Meds ordered this encounter  Medications  . venlafaxine XR (EFFEXOR-XR) 75 MG 24 hr capsule    Sig: Take 2 capsules (150 mg total) by mouth daily with breakfast.    Dispense:  60 capsule    Refill:  3    Order Specific Question:   Supervising Provider    Answer:   JEGEDE, OLUGBEMIGA E [[id number]]  . busPIRone (BUSPAR) 15 MG tablet    Sig: TAKE 1 TABLET(15 MG) BY MOUTH THREE TIMES DAILY    Dispense:  90 tablet    Refill:  2    Order Specific Question:   Supervising Provider    Answer:   JEGEDE, OLUGBEMIGA E [[id number]]  . insulin detemir (LEVEMIR) 100 UNIT/ML injection    Sig: INJECT 50 UNITS INTO THE SKIN TWICE DAILY EVERY 12 HOURS    Dispense:  10 mL    Refill:  3    Order Specific Question:   Supervising Provider    Answer:   JEGEDE, OLUGBEMIGA E [[id number]]  . DISCONTD: injection device for insulin  . insulin NPH Human (HUMULIN N,NOVOLIN N) injection 20 Units     Orders Placed This Encounter  Procedures  . Comprehensive metabolic panel  . VITAMIN D 25 Hydroxy (Vit-D Deficiency, Fractures)  . CBC  . Ambulatory referral to diabetic education  . POCT glycosylated hemoglobin (Hb A1C)  . POCT glucose (manual entry)  . POCT urinalysis dip (device)     RTC: 4 weeks for diabetes follow-up and repeat A1c     S. , MSN, FNP-C [facility]  [address] [place], [place]  [address] [phone]    

## 2017-06-18 NOTE — Patient Instructions (Addendum)
Your A1C today 14.5 which has significantly worsened since last office visit. I would like for you to increase your Lantus 50 units twice daily and continue Tradjenta 5 mg once daily. I am providing you with a diabetes sample diet plan.   I am also referring you to diabetes education.  Please take medication as prescribed consistently daily.    Diabetes Mellitus and Nutrition When you have diabetes (diabetes mellitus), it is very important to have healthy eating habits because your blood sugar (glucose) levels are greatly affected by what you eat and drink. Eating healthy foods in the appropriate amounts, at about the same times every day, can help you:  Control your blood glucose.  Lower your risk of heart disease.  Improve your blood pressure.  Reach or maintain a healthy weight.  Every person with diabetes is different, and each person has different needs for a meal plan. Your health care provider may recommend that you work with a diet and nutrition specialist (dietitian) to make a meal plan that is best for you. Your meal plan may vary depending on factors such as:  The calories you need.  The medicines you take.  Your weight.  Your blood glucose, blood pressure, and cholesterol levels.  Your activity level.  Other health conditions you have, such as heart or kidney disease.  How do carbohydrates affect me? Carbohydrates affect your blood glucose level more than any other type of food. Eating carbohydrates naturally increases the amount of glucose in your blood. Carbohydrate counting is a method for keeping track of how many carbohydrates you eat. Counting carbohydrates is important to keep your blood glucose at a healthy level, especially if you use insulin or take certain oral diabetes medicines. It is important to know how many carbohydrates you can safely have in each meal. This is different for every person. Your dietitian can help you calculate how many carbohydrates you  should have at each meal and for snack. Foods that contain carbohydrates include:  Bread, cereal, rice, pasta, and crackers.  Potatoes and corn.  Peas, beans, and lentils.  Milk and yogurt.  Fruit and juice.  Desserts, such as cakes, cookies, ice cream, and candy.  How does alcohol affect me? Alcohol can cause a sudden decrease in blood glucose (hypoglycemia), especially if you use insulin or take certain oral diabetes medicines. Hypoglycemia can be a life-threatening condition. Symptoms of hypoglycemia (sleepiness, dizziness, and confusion) are similar to symptoms of having too much alcohol. If your health care provider says that alcohol is safe for you, follow these guidelines:  Limit alcohol intake to no more than 1 drink per day for nonpregnant women and 2 drinks per day for men. One drink equals 12 oz of beer, 5 oz of wine, or 1 oz of hard liquor.  Do not drink on an empty stomach.  Keep yourself hydrated with water, diet soda, or unsweetened iced tea.  Keep in mind that regular soda, juice, and other mixers may contain a lot of sugar and must be counted as carbohydrates.  What are tips for following this plan? Reading food labels  Start by checking the serving size on the label. The amount of calories, carbohydrates, fats, and other nutrients listed on the label are based on one serving of the food. Many foods contain more than one serving per package.  Check the total grams (g) of carbohydrates in one serving. You can calculate the number of servings of carbohydrates in one serving by dividing the total  carbohydrates by 15. For example, if a food has 30 g of total carbohydrates, it would be equal to 2 servings of carbohydrates.  Check the number of grams (g) of saturated and trans fats in one serving. Choose foods that have low or no amount of these fats.  Check the number of milligrams (mg) of sodium in one serving. Most people should limit total sodium intake to less  than 2,300 mg per day.  Always check the nutrition information of foods labeled as "low-fat" or "nonfat". These foods may be higher in added sugar or refined carbohydrates and should be avoided.  Talk to your dietitian to identify your daily goals for nutrients listed on the label. Shopping  Avoid buying canned, premade, or processed foods. These foods tend to be high in fat, sodium, and added sugar.  Shop around the outside edge of the grocery store. This includes fresh fruits and vegetables, bulk grains, fresh meats, and fresh dairy. Cooking  Use low-heat cooking methods, such as baking, instead of high-heat cooking methods like deep frying.  Cook using healthy oils, such as olive, canola, or sunflower oil.  Avoid cooking with butter, cream, or high-fat meats. Meal planning  Eat meals and snacks regularly, preferably at the same times every day. Avoid going long periods of time without eating.  Eat foods high in fiber, such as fresh fruits, vegetables, beans, and whole grains. Talk to your dietitian about how many servings of carbohydrates you can eat at each meal.  Eat 4-6 ounces of lean protein each day, such as lean meat, chicken, fish, eggs, or tofu. 1 ounce is equal to 1 ounce of meat, chicken, or fish, 1 egg, or 1/4 cup of tofu.  Eat some foods each day that contain healthy fats, such as avocado, nuts, seeds, and fish. Lifestyle   Check your blood glucose regularly.  Exercise at least 30 minutes 5 or more days each week, or as told by your health care provider.  Take medicines as told by your health care provider.  Do not use any products that contain nicotine or tobacco, such as cigarettes and e-cigarettes. If you need help quitting, ask your health care provider.  Work with a Social worker or diabetes educator to identify strategies to manage stress and any emotional and social challenges. What are some questions to ask my health care provider?  Do I need to meet with a  diabetes educator?  Do I need to meet with a dietitian?  What number can I call if I have questions?  When are the best times to check my blood glucose? Where to find more information:  American Diabetes Association: diabetes.org/food-and-fitness/food  Academy of Nutrition and Dietetics: PokerClues.dk  [employer] of Diabetes and Digestive and Kidney Diseases (NIH): ContactWire.be Summary  A healthy meal plan will help you control your blood glucose and maintain a healthy lifestyle.  Working with a diet and nutrition specialist (dietitian) can help you make a meal plan that is best for you.  Keep in mind that carbohydrates and alcohol have immediate effects on your blood glucose levels. It is important to count carbohydrates and to use alcohol carefully. This information is not intended to replace advice given to you by your health care provider. Make sure you discuss any questions you have with your health care provider. Document Released: 12/15/2004 Document Revised: 04/24/2016 Document Reviewed: 04/24/2016 Elsevier Interactive Patient Education  [employer].

## 2017-06-19 LAB — CBC
Hematocrit: 38.9 % (ref 34.0–46.6)
Hemoglobin: 12.8 g/dL (ref 11.1–15.9)
MCH: 30.1 pg (ref 26.6–33.0)
MCHC: 32.9 g/dL (ref 31.5–35.7)
MCV: 92 fL (ref 79–97)
Platelets: 260 10*3/uL (ref 150–379)
RBC: 4.25 x10E6/uL (ref 3.77–5.28)
RDW: 13.4 % (ref 12.3–15.4)
WBC: 10.1 10*3/uL (ref 3.4–10.8)

## 2017-06-19 LAB — COMPREHENSIVE METABOLIC PANEL
ALT: 35 IU/L — ABNORMAL HIGH (ref 0–32)
AST: 31 IU/L (ref 0–40)
Albumin/Globulin Ratio: 1.3 (ref 1.2–2.2)
Albumin: 3.8 g/dL (ref 3.5–5.5)
Alkaline Phosphatase: 85 IU/L (ref 39–117)
BUN/Creatinine Ratio: 9 (ref 9–23)
BUN: 8 mg/dL (ref 6–24)
Bilirubin Total: <0.2 mg/dL (ref 0.0–1.2)
CO2: 24 mmol/L (ref 20–29)
Calcium: 9.5 mg/dL (ref 8.7–10.2)
Chloride: 98 mmol/L (ref 96–106)
Creatinine, Ser: 0.91 mg/dL (ref 0.57–1.00)
GFR calc Af Amer: 85 mL/min/{1.73_m2} (ref >59)
GFR calc non Af Amer: 74 mL/min/{1.73_m2} (ref >59)
Globulin, Total: 3 g/dL (ref 1.5–4.5)
Glucose: 329 mg/dL — ABNORMAL HIGH (ref 65–99)
Potassium: 4.1 mmol/L (ref 3.5–5.2)
Sodium: 137 mmol/L (ref 134–144)
Total Protein: 6.8 g/dL (ref 6.0–8.5)

## 2017-06-19 LAB — VITAMIN D 25 HYDROXY (VIT D DEFICIENCY, FRACTURES): Vit D, 25-Hydroxy: 25.1 ng/mL — ABNORMAL LOW (ref 30.0–100.0)

## 2017-06-20 ENCOUNTER — Telehealth: Payer: Self-pay | Admitting: Family Medicine

## 2017-06-20 MED ORDER — VITAMIN D (ERGOCALCIFEROL) 1.25 MG (50000 UNIT) PO CAPS: 50000.0000 [IU] | ORAL_CAPSULE | ORAL | 0 refills | Status: DC

## 2017-06-20 NOTE — Telephone Encounter (Signed)
Contact patient to advise recent labs were consistent with baseline with the exception of vitamin D level was low. I am prescribing vitamin D 50,000 units once weekly for 12 weeks.   Catherine Pope. Kenton Kingfisher, MSN, FNP-C [facility]  [address] [place], [place] [address] [phone]

## 2017-06-21 NOTE — Telephone Encounter (Signed)
Patient notified

## 2017-06-25 ENCOUNTER — Other Ambulatory Visit: Payer: Self-pay | Admitting: Family Medicine

## 2017-06-27 ENCOUNTER — Other Ambulatory Visit: Payer: Self-pay | Admitting: Family Medicine

## 2017-06-27 DIAGNOSIS — F329 Major depressive disorder, single episode, unspecified: Secondary | ICD-10-CM

## 2017-06-27 DIAGNOSIS — F32A Depression, unspecified: Secondary | ICD-10-CM

## 2017-06-27 DIAGNOSIS — F419 Anxiety disorder, unspecified: Secondary | ICD-10-CM

## 2017-06-27 MED FILL — LISINOPRIL 20 MG TAB: 20 | 30 days supply | Qty: 30 | Fill #7

## 2017-06-27 MED FILL — LEVEMIR 100 UNITS/ML VIAL: 100 | 10 days supply | Qty: 10 | Fill #1

## 2017-06-27 MED FILL — TOPIRAMATE 50 MG TABLET: 50 | 30 days supply | Qty: 60 | Fill #3

## 2017-06-27 MED FILL — HYDROCHLOROTHIAZIDE 12.5 MG: 12.5 | 30 days supply | Qty: 30 | Fill #3

## 2017-06-27 MED FILL — CYCLOBENZAPRINE 10 MG TAB: 10 | 30 days supply | Qty: 90 | Fill #0

## 2017-06-27 MED FILL — QUETIAPINE FUMARATE 100 MG: 100 | 30 days supply | Qty: 30 | Fill #0

## 2017-07-04 ENCOUNTER — Encounter: Payer: Self-pay | Admitting: Family Medicine

## 2017-07-04 ENCOUNTER — Ambulatory Visit (INDEPENDENT_AMBULATORY_CARE_PROVIDER_SITE_OTHER): Payer: Medicaid Other | Admitting: Family Medicine

## 2017-07-04 VITALS — BP 142/76 | HR 94 | Temp 97.7°F | Ht 66.0 in | Wt 239.0 lb

## 2017-07-04 DIAGNOSIS — R0602 Shortness of breath: Secondary | ICD-10-CM | POA: Diagnosis not present

## 2017-07-04 DIAGNOSIS — J40 Bronchitis, not specified as acute or chronic: Secondary | ICD-10-CM | POA: Diagnosis not present

## 2017-07-04 MED ORDER — METHYLPREDNISOLONE SODIUM SUCC 125 MG IJ SOLR
80.0000 mg | Freq: Once | INTRAMUSCULAR | Status: AC
  Administered 2017-07-04: 80 mg via INTRAMUSCULAR

## 2017-07-04 MED ORDER — AMOXICILLIN-POT CLAVULANATE 875-125 MG PO TABS: 1.0000 | ORAL_TABLET | Freq: Two times a day (BID) | ORAL | 0 refills | Status: DC

## 2017-07-04 MED ORDER — PREDNISONE 20 MG PO TABS: 40.0000 mg | ORAL_TABLET | Freq: Every day | ORAL | 0 refills | Status: AC

## 2017-07-04 MED ORDER — ALBUTEROL SULFATE (2.5 MG/3ML) 0.083% IN NEBU
2.5000 mg | INHALATION_SOLUTION | Freq: Once | RESPIRATORY_TRACT | Status: AC
  Administered 2017-07-04: 2.5 mg via RESPIRATORY_TRACT

## 2017-07-04 MED ORDER — IPRATROPIUM BROMIDE 0.02 % IN SOLN
0.5000 mg | Freq: Once | RESPIRATORY_TRACT | Status: AC
  Administered 2017-07-04: 0.5 mg via RESPIRATORY_TRACT

## 2017-07-04 MED FILL — AMOX-CLAV 875-125 MG TABLET: 875-125 | 10 days supply | Qty: 20 | Fill #0

## 2017-07-04 MED FILL — predniSONE 20 MG TABS: 20 | 3 days supply | Qty: 6 | Fill #0

## 2017-07-04 NOTE — Progress Notes (Signed)
Patient ID: Catherine Pope, female    DOB: November 23, 1965, 52 y.o.   MRN: [id number]  PCP: Scot Jun, FNP  Chief Complaint  Patient presents with  . Cough  . Laryngitis  . Nasal Congestion    Subjective:  HPI Catherine Pope is a 52 y.o. female with uncontrolled diabetes, uncontrolled hypertension, current daily smoker, chronic hoarseness, chronic pansinusitis, and bipolar disorder presents today with a complaint of URI symptoms, cough, and shortness of breath. Catherine Pope is currently under the care of a ENT doctor briars for management of chronic pansinusitis.  She saw Dr. Janace Hoard, ENT 1 day ago for evaluation of pansinusitis and he advised her that her current symptoms are non-related to sinusitis.  She presents today complaining of shortness of breath, persistent nonproductive cough, with some chest tightness and congestion, along with nasal congestion and hoarseness of voice.  She has been on chronic nasal sprays and antihistamine therapy which has not improved current symptoms.  She has no known history of asthma however is a chronic every day smoker and has never had an official diagnosis of COPD.  She endorses associated shortness of breath and notes an occasional wheeze.  She denies fever.   Social History   Socioeconomic History  . Marital status: Single    Spouse name: Not on file  . Number of children: 2  . Years of education: 55 th  . Highest education level: Not on file  Occupational History  . Occupation: unemployed  Social Needs  . Financial resource strain: Not on file  . Food insecurity:    Worry: Not on file    Inability: Not on file  . Transportation needs:    Medical: Not on file    Non-medical: Not on file  Tobacco Use  . Smoking status: Current Every Day Smoker    Packs/day: 0.50    Years: 15.00    Pack years: 7.50    Types: Cigarettes  . Smokeless tobacco: Never Used  Substance and Sexual Activity  . Alcohol use: No  . Drug use: No  . Sexual activity:  Not on file  Lifestyle  . Physical activity:    Days per week: Not on file    Minutes per session: Not on file  . Stress: Not on file  Relationships  . Social connections:    Talks on phone: Not on file    Gets together: Not on file    Attends religious service: Not on file    Active member of club or organization: Not on file    Attends meetings of clubs or organizations: Not on file    Relationship status: Not on file  . Intimate partner violence:    Fear of current or ex partner: Not on file    Emotionally abused: Not on file    Physically abused: Not on file    Forced sexual activity: Not on file  Other Topics Concern  . Not on file  Social History Narrative   Patient lives at home with her daughters and she is unemployed. Patient has two children.   Patient has a high school education.   Right handed.    Caffeine two cups daily.    Family History  Problem Relation Age of Onset  . Heart Problems Father    Review of Systems  Pertinent negatives listed in HPI Patient Active Problem List   Diagnosis Date Noted  . Cervical vertebral fusion 12/27/2016  . Essential hypertension 03/23/2016  . Laryngitis, acute 03/23/2016  .  Neuropathy 03/23/2016  . Tobacco dependence 03/23/2016  . Diabetes mellitus ([place]) 11/26/2013  . Morbid obesity ([place]) 11/26/2013  . Migraine without aura 11/26/2013  . Other and unspecified hyperlipidemia 11/26/2013    No Known Allergies  Prior to Admission medications   Medication Sig Start Date End Date Taking? Authorizing Provider  Blood Glucose Monitoring Suppl (TRUE METRIX AIR GLUCOSE METER) w/Device KIT 1 each by Does not apply route 4 (four) times daily - after meals and at bedtime. 03/23/16  Yes Dorena Dew, FNP  buPROPion [facility] SR) 150 MG 12 hr tablet TAKE 1 TABLET(150 MG) BY MOUTH TWICE DAILY 06/25/17  Yes Scot Jun, FNP  busPIRone (BUSPAR) 15 MG tablet TAKE 1 TABLET(15 MG) BY MOUTH THREE TIMES DAILY 06/18/17  Yes  Scot Jun, FNP  cetirizine (ZYRTEC) 10 MG tablet Take 1 tablet (10 mg total) by mouth daily. 12/25/16  Yes Scot Jun, FNP  Cholecalciferol (VITAMIN D PO) Take 50 Units by mouth daily.   Yes [provider]  cyclobenzaprine (FLEXERIL) 10 MG tablet Take 1 tablet (10 mg total) by mouth 3 (three) times daily as needed for muscle spasms. 11/21/16  Yes Scot Jun, FNP  cyclobenzaprine (FLEXERIL) 10 MG tablet TAKE 1 TABLET BY MOUTH 3 TIMES DAILY AS NEEDED (NECK PAIN). 06/27/17  Yes Scot Jun, FNP  Diclofenac Sodium 3 % GEL Place 1 application onto the skin 3 (three) times daily as needed. 04/27/17  Yes Robyn Haber, MD  gabapentin (NEURONTIN) 300 MG capsule Take 1 capsule (300 mg total) by mouth at bedtime. 10/17/16  Yes Scot Jun, FNP  Garlic 8250 MG CAPS Take 1,000 mg by mouth daily.   Yes [provider]  glucose blood (TRUE METRIX BLOOD GLUCOSE TEST) test strip Use as instructed 03/23/16  Yes Dorena Dew, FNP  hydrochlorothiazide (HYDRODIURIL) 12.5 MG tablet Take 1 tablet (12.5 mg total) by mouth daily. 10/17/16  Yes Scot Jun, FNP  insulin detemir (LEVEMIR) 100 UNIT/ML injection INJECT 50 UNITS INTO THE SKIN TWICE DAILY EVERY 12 HOURS 06/18/17  Yes Scot Jun, FNP  ipratropium (ATROVENT) 0.03 % nasal spray Place 2 sprays into both nostrils 2 (two) times daily. 10/17/16  Yes Scot Jun, FNP  Lancets [facility] ULTRASOFT) lancets Use as instructed 06/22/14  Yes Ernestina Patches, MD  lisinopril (PRINIVIL,ZESTRIL) 20 MG tablet Take 1 tablet (20 mg total) by mouth daily. 10/17/16  Yes Scot Jun, FNP  lovastatin (MEVACOR) 20 MG tablet Take 1 tablet (20 mg total) by mouth at bedtime. 10/17/16  Yes Scot Jun, FNP  metFORMIN (GLUCOPHAGE) 1000 MG tablet 500 mg with breakfast and 1000 mg at dinnertime Patient taking differently: Take 500-1,000 mg by mouth 2 (two) times daily with a meal. 500 mg with breakfast and  1000 mg at dinnertime 10/20/16  Yes Scot Jun, FNP  methocarbamol (ROBAXIN) 500 MG tablet TAKE 1 TABLET BY MOUTH 4 TIMES DAILY. 11/23/16  Yes Scot Jun, FNP  metoprolol tartrate (LOPRESSOR) 50 MG tablet Take 50 mg by mouth 2 (two) times daily.   Yes [provider]  naproxen (NAPROSYN) 500 MG tablet Take 1 tablet (500 mg total) by mouth 2 (two) times daily with a meal. 03/23/17  Yes Scot Jun, FNP  pravastatin (PRAVACHOL) 40 MG tablet Take 40 mg by mouth daily. 04/17/14  Yes [provider]  propranolol ER (INDERAL LA) 80 MG 24 hr capsule Take 80 mg by mouth daily. 04/17/14  Yes  [provider]  QUEtiapine (SEROQUEL) 100 MG tablet TAKE 1 TABLET BY MOUTH AT BEDTIME. 06/27/17  Yes Scot Jun, FNP  topiramate (TOPAMAX) 50 MG tablet TAKE 1 TABLET (50 MG TOTAL) BY MOUTH 2 (TWO) TIMES DAILY. 03/08/17  Yes Scot Jun, FNP  TRADJENTA 5 MG TABS tablet TAKE 1 TABLET BY MOUTH DAILY. 03/21/17  Yes Scot Jun, FNP  TRUEPLUS INSULIN SYRINGE 30G X 5/16" 0.3 ML MISC USE AS DIRECTED. 02/19/17  Yes Dorena Dew, FNP  venlafaxine XR (EFFEXOR-XR) 75 MG 24 hr capsule Take 2 capsules (150 mg total) by mouth daily with breakfast. 06/18/17  Yes Scot Jun, FNP  Vitamin D, Ergocalciferol, (DRISDOL) 50000 units CAPS capsule Take 1 capsule (50,000 Units total) by mouth every 7 (seven) days. 06/20/17  Yes Scot Jun, FNP    Past Medical, Surgical Family and Social History reviewed and updated.    Objective:   Today's Vitals   07/04/17 1000  BP: (!) 142/76  Pulse: 94  Temp: 97.7 F (36.5 C)  TempSrc: Oral  SpO2: 100%  Weight: 239 lb (108.4 kg)  Height: 5' 6" (1.676 m)    Wt Readings from Last 3 Encounters:  07/04/17 239 lb (108.4 kg)  06/18/17 246 lb (111.6 kg)  04/18/17 241 lb (109.3 kg)    Physical Exam         Assessment & Plan:  1. Bronchitis, acute onset. Currently wheezing and exhibits increase work of  breathing.  Will administer a Duo nebulizer treatment here in office. One time dose of Solumedrol 80 mg. Will defer long-term oral steroid therapy as patient suffers from uncontrolled diabetes-prednisone x 3 day only. Start Start Augmentin 1 tablet twice daily with food to avoid stomach upset.Complete all medication. -Chest x-ray pending to rule out pneumonia.  Meds ordered this encounter  Medications  . albuterol (PROVENTIL) (2.5 MG/3ML) 0.083% nebulizer solution 2.5 mg  . ipratropium (ATROVENT) nebulizer solution 0.5 mg  . methylPREDNISolone sodium succinate (SOLU-MEDROL) 125 mg/2 mL injection 80 mg  . predniSONE (DELTASONE) 20 MG tablet    Sig: Take 2 tablets (40 mg total) by mouth daily with breakfast for 3 days.    Dispense:  6 tablet    Refill:  0    Order Specific Question:   Supervising Provider    Answer:   Tresa Garter [id number]  . amoxicillin-clavulanate (AUGMENTIN) 875-125 MG tablet    Sig: Take 1 tablet by mouth 2 (two) times daily.    Dispense:  20 tablet    Refill:  0    Order Specific Question:   Supervising Provider    Answer:   Tresa Garter [id number]     RTC: Follow-up currently on-file however return sooner if symptoms worsen or do improve.    Carroll Sage. Kenton Kingfisher, MSN, FNP-C [facility]  [address] [place], [place] [address] [phone]

## 2017-07-04 NOTE — Patient Instructions (Signed)
I am increasing your Lantus to 55 units twice daily starting 07/05/2017, 07/06/2017, and 07/07/2017 while taking prednisone.  Start Augmentin 1 tablet twice daily with food to avoid stomach upset. Complete all medication.   Prednisone 40 mg x 3 days daily with breakfast for SOB.   Drink water or unsweetened beverages and focus on protein and vegetables while taking prednisone. Check blood sugar and notify the office of any readings greater than 300.    Acute Bronchitis, Adult Acute bronchitis is when air tubes (bronchi) in the lungs suddenly get swollen. The condition can make it hard to breathe. It can also cause these symptoms:  A cough.  Coughing up clear, yellow, or green mucus.  Wheezing.  Chest congestion.  Shortness of breath.  A fever.  Body aches.  Chills.  A sore throat.  Follow these instructions at home: Medicines  Take over-the-counter and prescription medicines only as told by your doctor.  If you were prescribed an antibiotic medicine, take it as told by your doctor. Do not stop taking the antibiotic even if you start to feel better. General instructions  Rest.  Drink enough fluids to keep your pee (urine) clear or pale yellow.  Avoid smoking and secondhand smoke. If you smoke and you need help quitting, ask your doctor. Quitting will help your lungs heal faster.  Use an inhaler, cool mist vaporizer, or humidifier as told by your doctor.  Keep all follow-up visits as told by your doctor. This is important. How is this prevented? To lower your risk of getting this condition again:  Wash your hands often with soap and water. If you cannot use soap and water, use hand sanitizer.  Avoid contact with people who have cold symptoms.  Try not to touch your hands to your mouth, nose, or eyes.  Make sure to get the flu shot every year.  Contact a doctor if:  Your symptoms do not get better in 2 weeks. Get help right away if:  You cough up  blood.  You have chest pain.  You have very bad shortness of breath.  You become dehydrated.  You faint (pass out) or keep feeling like you are going to pass out.  You keep throwing up (vomiting).  You have a very bad headache.  Your fever or chills gets worse. This information is not intended to replace advice given to you by your health care provider. Make sure you discuss any questions you have with your health care provider. Document Released: 09/06/2007 Document Revised: 10/27/2015 Document Reviewed: 09/08/2015 Elsevier Interactive Patient Education  [employer].

## 2017-07-11 ENCOUNTER — Encounter: Payer: Medicaid Other | Attending: Family Medicine | Admitting: Registered"

## 2017-07-11 ENCOUNTER — Encounter: Payer: Self-pay | Admitting: Registered"

## 2017-07-11 DIAGNOSIS — Z713 Dietary counseling and surveillance: Secondary | ICD-10-CM | POA: Diagnosis present

## 2017-07-11 DIAGNOSIS — E119 Type 2 diabetes mellitus without complications: Secondary | ICD-10-CM

## 2017-07-11 MED FILL — LOVASTATIN 20 MG TABLET: 20 | 30 days supply | Qty: 30 | Fill #8

## 2017-07-11 MED FILL — GABAPENTIN 300 MG CAPSULE: 300 | 30 days supply | Qty: 30 | Fill #2

## 2017-07-11 NOTE — Patient Instructions (Signed)
-   Decrease Pepsi intake to 1 liter/day.   - Check blood sugar fasting, 2 hrs after lunch, and at bedtime. Track numbers.   - Aim to eat 3 meals/day. Use plate method on page 15 in book as guide.   - Track food and blood sugar numbers.

## 2017-07-11 NOTE — Progress Notes (Signed)
Diabetes Self-Management Education  Visit Type: First/Initial  Appt. Start Time: 10:00 Appt. End Time: 11:25  07/11/2017   Pt expectations: none stated; states she is here because her doctor referred her  Catherine Pope, identified by name and date of birth, is a 52 y.o. female with a diagnosis of Diabetes: Type 2. Pt arrives with 50 year old daughter.   Pt states she has 2 bulging discs in her back which causes her to fall sometimes. Pt states back pain is not too bothersome, not to the point of needing surgery. Pt states she does not want to have anyone messing with her back for surgery. Pt states she has 5 children (2 older and 3 younger, recently adopted-ages 67, 8, and 4).   Pt states she has been depressed for the past 4 years since her dad passed away; states she also has some anxiety. Pt states she has been withdrawn from people on and off. Pt states she used to see a therapist but they were only medicating her. Pt states she did not feel like she was getting better. Pt states she is tired of taking so many drugs, feels like she is a drug store. Pt states she will talk to PCP before she stops taking depression medications; states she feels like she's a [place] pig.   Pt states she feels like she is starving for food at times and then only eats a small amount when she finally eats. Pt states she wishes she didn't have diabetes Pt states she thinks it comes from drinking Pepsi, her culprit. Pt states PCP wants her to check BS at least 2x/day and 3x/day when sick. Pt states she has been stressed the least month and a half. Pt states she drinks water before bed and has cut off time for Pepsi; averages 2 liters of Pepsi a day. Pt states she sometimes feels bloated and does not eat dinner. Pt states sometimes she does not have enough food or money for food to last her from month to month. Pt states she was receiving EBT benefits $631/month and now getting $300/month.   ASSESSMENT  There were  no vitals taken for this visit. There is no height or weight on file to calculate BMI.  Diabetes Self-Management Education - 07/11/17 1003      Visit Information   Visit Type  First/Initial      Initial Visit   Diabetes Type  Type 2    Are you currently following a meal plan?  No    Are you taking your medications as prescribed?  Yes    Date Diagnosed  8 years ago      Health Coping   How would you rate your overall health?  Fair      Psychosocial Assessment   Patient Belief/Attitude about Diabetes  Other (comment) Pt states she wishes she did not have it    Self-care barriers  None    Self-management support  Family;Doctor's office;Friends    Other persons present  Patient;Other (comment) daughter    Patient Concerns  Nutrition/Meal planning    Special Needs  None    Preferred Learning Style  No preference indicated    Learning Readiness  Contemplating    How often do you need to have someone help you when you read instructions, pamphlets, or other written materials from your doctor or pharmacy?  1 - Never    What is the last grade level you completed in school?  12th grade  Complications   Last HgB A1C per patient/outside source  14.9 %    How often do you check your blood sugar?  0 times/day (not testing)    Fasting Blood glucose range (mg/dL)  180-200    Postprandial Blood glucose range (mg/dL)  130-179;70-129    Number of hypoglycemic episodes per month  3    Can you tell when your blood sugar is low?  Yes    What do you do if your blood sugar is low?  eats peppermints or orange    Number of hyperglycemic episodes per week  0    Have you had a dilated eye exam in the past 12 months?  No    Have you had a dental exam in the past 12 months?  No    Are you checking your feet?  Yes    How many days per week are you checking your feet?  3      Dietary Intake   Breakfast  honey nut cheerios with whole milk    Snack (morning)  none    Lunch  skips    Snack (afternoon)   sometimes viennas orange or peanut butter crackers    Dinner  sometimes chicken, green beans, rice or noodles, chicken, broccoli    Snack (evening)  none    Beverage(s)  Pepsi (2 liter/day), sweet tea, water, whole milk      Exercise   How many days per week to you exercise?  0    How many minutes per day do you exercise?  0    Total minutes per week of exercise  0      Patient Education   Previous Diabetes Education  Yes (please comment)    Disease state   Definition of diabetes, type 1 and 2, and the diagnosis of diabetes;Factors that contribute to the development of diabetes    Nutrition management   Role of diet in the treatment of diabetes and the relationship between the three main macronutrients and blood glucose level    Monitoring  Taught/discussed recording of test results and interpretation of SMBG.;Identified appropriate SMBG and/or A1C goals.;Purpose and frequency of SMBG.    Acute complications  Taught treatment of hypoglycemia - the 15 rule.    Psychosocial adjustment  Role of stress on diabetes    Personal strategies to promote health  Lifestyle issues that need to be addressed for better diabetes care      Individualized Goals (developed by patient)   Nutrition  General guidelines for healthy choices and portions discussed    Medications  take my medication as prescribed    Monitoring   test my blood glucose as discussed      Post-Education Assessment   Patient understands the diabetes disease and treatment process.  Demonstrates understanding / competency    Patient understands incorporating nutritional management into lifestyle.  Demonstrates understanding / competency    Patient undertands incorporating physical activity into lifestyle.  Needs Instruction    Patient understands using medications safely.  Demonstrates understanding / competency    Patient understands monitoring blood glucose, interpreting and using results  Demonstrates understanding / competency     Patient understands prevention, detection, and treatment of acute complications.  Demonstrates understanding / competency    Patient understands prevention, detection, and treatment of chronic complications.  Needs Instruction    Patient understands how to develop strategies to address psychosocial issues.  Needs Review    Patient understands how to develop strategies to  promote health/change behavior.  Demonstrates understanding / competency      Outcomes   Expected Outcomes  Demonstrated interest in learning. Expect positive outcomes    Future DMSE  4-6 wks    Program Status  Not Completed       Individualized Plan for Diabetes Self-Management Training:   Learning Objective:  Patient will have a greater understanding of diabetes self-management. Patient education plan is to attend individual and/or group sessions per assessed needs and concerns.   Plan:   Patient Instructions  - Decrease Pepsi intake to 1 liter/day.   - Check blood sugar fasting, 2 hrs after lunch, and at bedtime. Track numbers.   - Aim to eat 3 meals/day. Use plate method on page 15 in book as guide.   - Track food and blood sugar numbers.    Expected Outcomes:  Demonstrated interest in learning. Expect positive outcomes  Education material provided: Living Well with Diabetes and My Plate - [place] Providers - Patient Food Resources  If problems or questions, patient to contact team via:  Phone and Email  Future DSME appointment: 4-6 wks

## 2017-07-12 ENCOUNTER — Ambulatory Visit (HOSPITAL_COMMUNITY)
Admission: RE | Admit: 2017-07-12 | Discharge: 2017-07-12 | Disposition: A | Discharge status: Home / Self Care | Payer: Medicaid Other | Source: Ambulatory Visit | Attending: Family Medicine | Admitting: Family Medicine

## 2017-07-12 ENCOUNTER — Telehealth: Payer: Self-pay

## 2017-07-12 DIAGNOSIS — R0602 Shortness of breath: Secondary | ICD-10-CM | POA: Insufficient documentation

## 2017-07-12 DIAGNOSIS — R490 Dysphonia: Secondary | ICD-10-CM | POA: Insufficient documentation

## 2017-07-12 DIAGNOSIS — R05 Cough: Secondary | ICD-10-CM | POA: Insufficient documentation

## 2017-07-12 DIAGNOSIS — R059 Cough, unspecified: Secondary | ICD-10-CM

## 2017-07-12 NOTE — Telephone Encounter (Signed)
Note not needed 

## 2017-07-17 MED FILL — LEVEMIR 100 UNITS/ML VIAL: 100 | 10 days supply | Qty: 10 | Fill #2

## 2017-07-17 MED FILL — TRADJENTA 5 MG TABLET: 5 | 30 days supply | Qty: 30 | Fill #3

## 2017-07-17 MED FILL — CETIRIZINE HCL 10 MG TABS: 10 | 30 days supply | Qty: 30 | Fill #6

## 2017-07-19 ENCOUNTER — Ambulatory Visit (HOSPITAL_COMMUNITY)
Admission: RE | Admit: 2017-07-19 | Discharge: 2017-07-19 | Disposition: A | Discharge status: Home / Self Care | Payer: Medicaid Other | Source: Ambulatory Visit | Attending: Family Medicine | Admitting: Family Medicine

## 2017-07-19 ENCOUNTER — Encounter: Payer: Self-pay | Admitting: Family Medicine

## 2017-07-19 ENCOUNTER — Ambulatory Visit (INDEPENDENT_AMBULATORY_CARE_PROVIDER_SITE_OTHER): Payer: Medicaid Other | Admitting: Family Medicine

## 2017-07-19 VITALS — BP 142/73 | HR 108 | Temp 98.1°F | Resp 16 | Ht 66.0 in | Wt 240.0 lb

## 2017-07-19 DIAGNOSIS — J40 Bronchitis, not specified as acute or chronic: Secondary | ICD-10-CM | POA: Diagnosis not present

## 2017-07-19 DIAGNOSIS — R739 Hyperglycemia, unspecified: Secondary | ICD-10-CM | POA: Insufficient documentation

## 2017-07-19 DIAGNOSIS — F17200 Nicotine dependence, unspecified, uncomplicated: Secondary | ICD-10-CM

## 2017-07-19 DIAGNOSIS — I10 Essential (primary) hypertension: Secondary | ICD-10-CM

## 2017-07-19 DIAGNOSIS — F32A Depression, unspecified: Secondary | ICD-10-CM

## 2017-07-19 DIAGNOSIS — N898 Other specified noninflammatory disorders of vagina: Secondary | ICD-10-CM

## 2017-07-19 DIAGNOSIS — F329 Major depressive disorder, single episode, unspecified: Secondary | ICD-10-CM | POA: Diagnosis not present

## 2017-07-19 DIAGNOSIS — E0800 Diabetes mellitus due to underlying condition with hyperosmolarity without nonketotic hyperglycemic-hyperosmolar coma (NKHHC): Secondary | ICD-10-CM | POA: Diagnosis not present

## 2017-07-19 LAB — BASIC METABOLIC PANEL
Anion gap: 11 (ref 5–15)
BUN: 8 mg/dL (ref 6–20)
CO2: 22 mmol/L (ref 22–32)
Calcium: 9.8 mg/dL (ref 8.9–10.3)
Chloride: 104 mmol/L (ref 101–111)
Creatinine, Ser: 0.88 mg/dL (ref 0.44–1.00)
GFR calc Af Amer: >60 mL/min (ref >60)
GFR calc non Af Amer: >60 mL/min (ref >60)
Glucose, Bld: 459 mg/dL — ABNORMAL HIGH (ref 65–99)
Potassium: 3.8 mmol/L (ref 3.5–5.1)
Sodium: 137 mmol/L (ref 135–145)

## 2017-07-19 LAB — POCT URINALYSIS DIPSTICK
Bilirubin, UA: NEGATIVE
Blood, UA: NEGATIVE
Glucose, UA: 500
Ketones, UA: NEGATIVE
Leukocytes, UA: NEGATIVE
Protein, UA: NEGATIVE
Spec Grav, UA: 1.01 (ref 1.010–1.025)
Urobilinogen, UA: 0.2 E.U./dL
pH, UA: 6.5 (ref 5.0–8.0)

## 2017-07-19 LAB — GLUCOSE, POCT (MANUAL RESULT ENTRY)
POC Glucose: 542 mg/dl — AB (ref 70–99)
POC Glucose: 492 mg/dl — AB (ref 70–99)

## 2017-07-19 LAB — POCT GLYCOSYLATED HEMOGLOBIN (HGB A1C): Hemoglobin A1C: 14.7

## 2017-07-19 MED ORDER — SODIUM CHLORIDE 0.9 % IV SOLN
INTRAVENOUS | Status: DC
  Administered 2017-07-19: 12:00:00 via INTRAVENOUS

## 2017-07-19 MED ORDER — ALBUTEROL SULFATE HFA 108 (90 BASE) MCG/ACT IN AERS: 2.0000 | INHALATION_SPRAY | Freq: Four times a day (QID) | RESPIRATORY_TRACT | 0 refills | Status: DC | PRN

## 2017-07-19 MED ORDER — INSULIN DETEMIR 100 UNIT/ML ~~LOC~~ SOLN: SUBCUTANEOUS | 3 refills | Status: DC

## 2017-07-19 MED ORDER — NON FORMULARY
20.0000 [IU] | Freq: Once | Status: AC
  Administered 2017-07-19: 20 [IU] via SUBCUTANEOUS

## 2017-07-19 MED FILL — PROAIR HFA 90 MCG INHALER: 108 (90 BAS | 25 days supply | Qty: 9 | Fill #0

## 2017-07-19 NOTE — Progress Notes (Signed)
PATIENT CARE CENTER NOTE  Diagnosis: Hyperglycemia    Provider: [place] Hollis, FNP   Procedure: 1 Liter bolus of 0.9% Normal Saline    Note: Patient received 1 Liter bolus of fluid due to hyperglycemia. Patient tolerated infusion well. CBG rechecked and was 297 before discharge. Discharge instructions given to patient. Patient alert, oriented and ambulatory at discharge.

## 2017-07-19 NOTE — Progress Notes (Signed)
Subjective:    Patient ID: Catherine Pope, female    DOB: 23-Dec-1965, 52 y.o.   MRN: [id number]  HPI Catherine Pope, a very pleasant 52 year old female with a history of uncontrolled diabetes, chronic sinusitis, tobacco dependence, anxiety, and hypertension presents for follow up of chronic conditions.  Patient says that she has not been taking antidiabetic medications consistently over the past several weeks. She has also not been checking CBGs consistently. She continues to follow a high fat, high cholesterol diet. Patient also does not exercise routinely. She says that family stress has increased, which has interfered with overall self care. Catherine Pope's blood sugar was 492 on arrival. She is asymptomatic at present. Denies headache, blurred vision, dizziness, polyuria, polydipsia, or polyphagia.  Cardiac risk factors include: obesity, sedentary lifestyle, and tobacco dependence. Patient is a chronic tobacco user with a 30 year smoking history. She has attempted to quit in the past without success.   Catherine Pope has a history of anxiety and depression. She endorses feelings of losing control and anhedonia. She attributes current anxiety to family stressors. She feels that she is always giving to to others and never receives help. Patient has not been taking prescribed medications consistently. She denies suicidal or homicidal intent. She is not followed by psychiatry at present.   Past Medical History:  Diagnosis Date  . Anxiety   . Depression   . Diabetes mellitus    dx back in 2010  . Hypertension   . Migraine   . Sinusitis    Social History   Socioeconomic History  . Marital status: Single    Spouse name: Not on file  . Number of children: 2  . Years of education: 80 th  . Highest education level: Not on file  Occupational History  . Occupation: unemployed  Social Needs  . Financial resource strain: Not on file  . Food insecurity:    Worry: Sometimes true    Inability:  Sometimes true  . Transportation needs:    Medical: Not on file    Non-medical: Not on file  Tobacco Use  . Smoking status: Current Every Day Smoker    Packs/day: 0.50    Years: 15.00    Pack years: 7.50    Types: Cigarettes  . Smokeless tobacco: Never Used  Substance and Sexual Activity  . Alcohol use: No  . Drug use: No  . Sexual activity: Not on file  Lifestyle  . Physical activity:    Days per week: Not on file    Minutes per session: Not on file  . Stress: Not on file  Relationships  . Social connections:    Talks on phone: Not on file    Gets together: Not on file    Attends religious service: Not on file    Active member of club or organization: Not on file    Attends meetings of clubs or organizations: Not on file    Relationship status: Not on file  . Intimate partner violence:    Fear of current or ex partner: Not on file    Emotionally abused: Not on file    Physically abused: Not on file    Forced sexual activity: Not on file  Other Topics Concern  . Not on file  Social History Narrative   Patient lives at home with her daughters and she is unemployed. Patient has two children.   Patient has a high school education.   Right handed.    Caffeine  two cups daily.   Immunization History  Administered Date(s) Administered  . Influenza,inj,Quad PF,6+ Mos 01/05/2014, 12/14/2015, 12/25/2016  . Influenza-Unspecified 04/28/2011, 04/29/2012  . Pneumococcal Polysaccharide-23 02/02/2012  . Pneumococcal-Unspecified 04/03/2010  . Tdap 04/28/2011, 01/05/2014   No Known Allergies  Review of Systems  Constitutional: Positive for fatigue. Negative for fever and unexpected weight change.  HENT: Positive for congestion and postnasal drip.   Respiratory: Positive for shortness of breath (occasional). Negative for chest tightness.   Cardiovascular: Negative.   Gastrointestinal: Negative.  Negative for constipation, diarrhea and nausea.  Endocrine: Negative for polydipsia,  polyphagia and polyuria.  Musculoskeletal: Negative.   Skin: Negative.   Hematological: Negative.   Psychiatric/Behavioral: The patient is nervous/anxious.        Depressed mood       Objective:   Physical Exam  Constitutional: She is oriented to person, place, and time. She appears well-developed and well-nourished.  Eyes: Pupils are equal, round, and reactive to light.  Eye redness   Neck: Normal range of motion. Neck supple.  Cardiovascular: Normal rate, regular rhythm, normal heart sounds and intact distal pulses.  Pulmonary/Chest: Effort normal and breath sounds normal.  Abdominal: Soft. Bowel sounds are normal.  Abdominal obesity  Musculoskeletal: Normal range of motion.  Neurological: She is alert and oriented to person, place, and time. She has normal reflexes.  Skin: Skin is warm and dry.  Psychiatric: Her speech is normal and behavior is normal. Judgment and thought content normal. Her mood appears anxious. Cognition and memory are normal. She exhibits a depressed mood.      BP (!) 142/73 (BP Location: Right Arm, Patient Position: Sitting, Cuff Size: Large)   Pulse (!) 108   Temp 98.1 F (36.7 C) (Oral)   Resp 16   Ht 5\' 6"  (1.676 m)   Wt 240 lb (108.9 kg)   SpO2 100%   BMI 38.74 kg/m  Assessment & Plan:  1. Diabetes mellitus due to underlying condition with hyperosmolarity without coma, unspecified whether long term insulin use (HCC) CBG 542 on arrival, administered Humalog 20 units. Patient transitioned to day infusion center for fluid bolus to assist with hyperglycemia.  Hemoglobin a1C is 14.7, which is significantly above goal. Catherine Pope has not been adherent to medication regimen. She does not check CBGs consistently.  Discussed the importance of diet and medication compliance in order to achieve positive outcomes. Catherine Pope expressed understanding.  Hemoglobin a1C goal is <7. Carbohydrate modified diet discussed at length. Patient will also benefit from  endocrinology consult.  - Urinalysis Dipstick - Glucose (CBG) - HgB A1c - NON FORMULARY 20 Units - insulin detemir (LEVEMIR) 100 UNIT/ML injection; INJECT 60 UNITS INTO THE SKIN TWICE DAILY EVERY 12 HOURS  Dispense: 10 mL; Refill: 3 - albuterol (PROVENTIL HFA;VENTOLIN HFA) 108 (90 Base) MCG/ACT inhaler; Inhale 2 puffs into the lungs every 6 (six) hours as needed for wheezing or shortness of breath.  Dispense: 1 Inhaler; Refill: 0 - Ambulatory referral to Endocrinology  2. Depression, unspecified depression type Reviewed medications for depression and anxiety.  Discussed the importance of medication compliance to avert symptoms of depression and anxiety.   3. Morbid obesity ([place]) Body mass index is 38.74 kg/m.  Recommend a lowfat, low carbohydrate diet divided over 5-6 small meals, increase water intake to 6-8 glasses, and 150 minutes per week of cardiovascular exercise.    4. Essential hypertension Blood pressure is above goal on current medication regimen.  We have discussed target BP range and blood  pressure goal. I have advised patient to check BP regularly and report to clinic if the numbers are consistently higher than 140/90. We discussed the importance of compliance with medical therapy and DASH diet recommended, consequences of uncontrolled hypertension discussed.  - continue current BP medications   5. Tobacco dependence Smoking cessation instruction/counseling given:  counseled patient on the dangers of tobacco use, advised patient to stop smoking, and reviewed strategies to maximize success  6. Hyperglycemia Patient transitioned to day infusion center for fluid bolus.  Administered 20 units of Humalog - Glucose (CBG)  7. Bronchitis Smoking cessation discussed to improve symptoms.  Continue Albuterol inhaler every 6 hours as needed  8. Vaginal discharge - Vaginitis/Vaginosis, DNA Probe   RTC: Lab follow up in 2 weeks   Donia Pounds  MSN, FNP-C Patient  [place], [place] [address] [phone]   Greater than 50% of appoint spent face to face reviewing medications, diet, exercise regimen, and reviewing medical record.

## 2017-07-19 NOTE — Discharge Instructions (Signed)
Today you received a bolus of 1000 ml of Normal Saline to decrease your blood glucose level.    Hyperglycemia Hyperglycemia is when the sugar (glucose) level in your blood is too high. It may not cause symptoms. If you do have symptoms, they may include warning signs, such as:  Feeling more thirsty than normal.  Hunger.  Feeling tired.  Needing to pee (urinate) more than normal.  Blurry eyesight (vision).  You may get other symptoms as it gets worse, such as:  Dry mouth.  Not being hungry (loss of appetite).  Fruity-smelling breath.  Weakness.  Weight gain or loss that is not planned. Weight loss may be fast.  A tingling or numb feeling in your hands or feet.  Headache.  Skin that does not bounce back quickly when it is lightly pinched and released (poor skin turgor).  Pain in your belly (abdomen).  Cuts or bruises that heal slowly.  High blood sugar can happen to people who do or do not have diabetes. High blood sugar can happen slowly or quickly, and it can be an emergency. Follow these instructions at home: General instructions  Take over-the-counter and prescription medicines only as told by your doctor.  Do not use products that contain nicotine or tobacco, such as cigarettes and e-cigarettes. If you need help quitting, ask your doctor.  Limit alcohol intake to no more than 1 drink per day for nonpregnant women and 2 drinks per day for men. One drink equals 12 oz of beer, 5 oz of wine, or 1 oz of hard liquor.  Manage stress. If you need help with this, ask your doctor.  Keep all follow-up visits as told by your doctor. This is important. Eating and drinking  Stay at a healthy weight.  Exercise regularly, as told by your doctor.  Drink enough fluid, especially when you: ? Exercise. ? Get sick. ? Are in hot temperatures.  Eat healthy foods, such as: ? Low-fat (lean) proteins. ? Complex carbs (complex carbohydrates), such as whole wheat bread or  brown rice. ? Fresh fruits and vegetables. ? Low-fat dairy products. ? Healthy fats.  Drink enough fluid to keep your pee (urine) clear or pale yellow. If you have diabetes:  Make sure you know the symptoms of hyperglycemia.  Follow your diabetes management plan, as told by your doctor. Make sure you: ? Take insulin and medicines as told. ? Follow your exercise plan. ? Follow your meal plan. Eat on time. Do not skip meals. ? Check your blood sugar as often as told. Make sure to check before and after exercise. If you exercise longer or in a different way than you normally do, check your blood sugar more often. ? Follow your sick day plan whenever you cannot eat or drink normally. Make this plan ahead of time with your doctor.  Share your diabetes management plan with people in your workplace, school, and household.  Check your urine for ketones when you are ill and as told by your doctor.  Carry a card or wear jewelry that says that you have diabetes. Contact a doctor if:  Your blood sugar level is higher than 240 mg/dL (13.3 mmol/L) for 2 days in a row.  You have problems keeping your blood sugar in your target range.  High blood sugar happens often for you. Get help right away if:  You have trouble breathing.  You have a change in how you think, feel, or act (mental status).  You feel sick to  your stomach (nauseous), and that feeling does not go away.  You cannot stop throwing up (vomiting). These symptoms may be an emergency. Do not wait to see if the symptoms will go away. Get medical help right away. Call your local emergency services (911 in the [place] Do not drive yourself to the hospital. Summary  Hyperglycemia is when the sugar (glucose) level in your blood is too high.  High blood sugar can happen to people who do or do not have diabetes.  Make sure you drink enough fluids, eat healthy foods, and exercise regularly.  Contact your doctor if you have problems  keeping your blood sugar in your target range. This information is not intended to replace advice given to you by your health care provider. Make sure you discuss any questions you have with your health care provider. Document Released: 01/15/2009 Document Revised: 12/06/2015 Document Reviewed: 12/06/2015 Elsevier Interactive Patient Education  2017 [employer].

## 2017-07-21 LAB — VAGINITIS/VAGINOSIS, DNA PROBE
Candida Species: POSITIVE — AB
Gardnerella vaginalis: NEGATIVE
Trichomonas vaginosis: NEGATIVE

## 2017-07-22 ENCOUNTER — Other Ambulatory Visit: Payer: Self-pay | Admitting: Family Medicine

## 2017-07-22 DIAGNOSIS — B3731 Acute candidiasis of vulva and vagina: Secondary | ICD-10-CM

## 2017-07-22 DIAGNOSIS — B373 Candidiasis of vulva and vagina: Secondary | ICD-10-CM

## 2017-07-22 MED ORDER — FLUCONAZOLE 150 MG PO TABS: 150.0000 mg | ORAL_TABLET | Freq: Once | ORAL | 0 refills | Status: AC

## 2017-07-22 NOTE — Progress Notes (Signed)
Meds ordered this encounter  Medications  . fluconazole (DIFLUCAN) 150 MG tablet    Sig: Take 1 tablet (150 mg total) by mouth once for 1 dose.    Dispense:  1 tablet    Refill:  0    Catherine Pounds  MSN, FNP-C Patient [facility] [address] [place], [place] [address] [phone]

## 2017-07-23 ENCOUNTER — Telehealth: Payer: Self-pay

## 2017-07-23 MED FILL — FLUCONAZOLE 150 MG TABLET: 150 | 1 days supply | Qty: 1 | Fill #0

## 2017-07-23 NOTE — Telephone Encounter (Signed)
Called, no answer. Left a message advising that vaginal swab yielded yeast and that she has sent in a diflucan table as a single dose. Asked that she keep next scheduled appointment and call if any questions. Thanks!

## 2017-07-23 NOTE — Telephone Encounter (Signed)
-----   Message from Dorena Dew, [place] sent at 07/22/2017 11:02 AM EDT ----- Regarding: lab results Please inform patient that vaginal swab yielded yeast species. Will send in Diflucan times 1 pharmacy. Follow up in office as scheduled.   Donia Pounds  MSN, FNP-C Patient [facility] [address] [place], [place] [address] [phone]

## 2017-07-25 NOTE — Patient Instructions (Signed)
I have increased Levemir to 60 units twice daily. Check blood sugar fasting in am and prior to meals.  Recommend a carbohydrate modified diet as discussed. Given copy of 1800 calorie antidiabetic diet.   Continue all other prescribed medications as discussed.   Your A1C goal is less than 7. Your fasting blood sugar upon awakening goal is between 110-140.   Blood pressure goal is <140/90.  Recommend a lowfat, low carbohydrate diet divided over 5-6 small meals, increase water intake to 6-8 glasses, and 150 minutes per week of cardiovascular exercise.   Take your medications as prescribed Make sure that you are familiar with each one of your medications and what they are used to treat.  If you are unsure of medications, please bring to follow up  Please keep your scheduled follow up appointment.

## 2017-07-27 ENCOUNTER — Other Ambulatory Visit: Payer: Self-pay | Admitting: Family Medicine

## 2017-07-27 MED FILL — QUETIAPINE FUMARATE 100 MG: 100 | 30 days supply | Qty: 30 | Fill #1

## 2017-07-27 MED FILL — LEVEMIR 100 UNITS/ML VIAL: 100 | 8 days supply | Qty: 10 | Fill #0

## 2017-07-27 MED FILL — LISINOPRIL 20 MG TAB: 20 | 30 days supply | Qty: 30 | Fill #8

## 2017-07-27 MED FILL — SUMATRIPTAN SUCC 50 MG TABL: 50 | 30 days supply | Qty: 9 | Fill #1

## 2017-07-27 MED FILL — HYDROCHLOROTHIAZIDE 12.5 MG: 12.5 | 30 days supply | Qty: 30 | Fill #4

## 2017-07-30 ENCOUNTER — Telehealth: Payer: Self-pay

## 2017-07-30 ENCOUNTER — Other Ambulatory Visit: Payer: Self-pay | Admitting: Family Medicine

## 2017-07-30 DIAGNOSIS — R05 Cough: Secondary | ICD-10-CM

## 2017-07-30 DIAGNOSIS — R059 Cough, unspecified: Secondary | ICD-10-CM

## 2017-07-30 MED ORDER — GUAIFENESIN 100 MG/5ML PO SOLN
5.0000 mL | ORAL | 0 refills | Status: DC | PRN
Start: 2017-07-30 — End: 2017-09-12

## 2017-07-30 MED FILL — TOPIRAMATE 50 MG TABLET: 50 | 30 days supply | Qty: 60 | Fill #0

## 2017-07-30 MED FILL — CYCLOBENZAPRINE 10 MG TAB: 10 | 30 days supply | Qty: 90 | Fill #0

## 2017-07-30 NOTE — Progress Notes (Signed)
Meds ordered this encounter  Medications  . guaiFENesin (ROBITUSSIN) 100 MG/5ML SOLN    Sig: Take 5 mLs (100 mg total) by mouth every 4 (four) hours as needed for cough or to loosen phlegm.    Dispense:  1200 mL    Refill:  0    Donia Pounds  MSN, FNP-C Patient [facility] [address] [place], [place] [address] [phone]

## 2017-07-30 NOTE — Telephone Encounter (Signed)
Patient would like to know if she can get a script for cough syrup because she has a really bad cough and she feels really bad.

## 2017-08-02 ENCOUNTER — Other Ambulatory Visit: Payer: Medicaid Other

## 2017-08-02 DIAGNOSIS — I10 Essential (primary) hypertension: Secondary | ICD-10-CM

## 2017-08-03 LAB — BASIC METABOLIC PANEL
BUN/Creatinine Ratio: 11 (ref 9–23)
BUN: 8 mg/dL (ref 6–24)
CO2: 22 mmol/L (ref 20–29)
Calcium: 9.9 mg/dL (ref 8.7–10.2)
Chloride: 100 mmol/L (ref 96–106)
Creatinine, Ser: 0.72 mg/dL (ref 0.57–1.00)
GFR calc Af Amer: 113 mL/min/{1.73_m2} (ref >59)
GFR calc non Af Amer: 98 mL/min/{1.73_m2} (ref >59)
Glucose: 214 mg/dL — ABNORMAL HIGH (ref 65–99)
Potassium: 3.9 mmol/L (ref 3.5–5.2)
Sodium: 137 mmol/L (ref 134–144)

## 2017-08-03 LAB — LIPID PANEL
Chol/HDL Ratio: 4.2 ratio (ref 0.0–4.4)
Cholesterol, Total: 143 mg/dL (ref 100–199)
HDL: 34 mg/dL — ABNORMAL LOW (ref >39)
LDL Calculated: 87 mg/dL (ref 0–99)
Triglycerides: 112 mg/dL (ref 0–149)
VLDL Cholesterol Cal: 22 mg/dL (ref 5–40)

## 2017-08-08 ENCOUNTER — Ambulatory Visit: Payer: Medicaid Other | Admitting: Registered"

## 2017-08-13 MED FILL — GABAPENTIN 300 MG CAPSULE: 300 | 30 days supply | Qty: 30 | Fill #3

## 2017-08-13 MED FILL — LOVASTATIN 20 MG TABLET: 20 | 30 days supply | Qty: 30 | Fill #9

## 2017-08-13 MED FILL — LEVEMIR 100 UNITS/ML VIAL: 100 | 25 days supply | Qty: 30 | Fill #1

## 2017-08-15 ENCOUNTER — Ambulatory Visit (INDEPENDENT_AMBULATORY_CARE_PROVIDER_SITE_OTHER): Payer: Medicaid Other

## 2017-08-15 ENCOUNTER — Encounter (HOSPITAL_COMMUNITY): Payer: Self-pay | Admitting: Emergency Medicine

## 2017-08-15 ENCOUNTER — Ambulatory Visit (HOSPITAL_COMMUNITY)
Admission: EM | Admit: 2017-08-15 | Discharge: 2017-08-15 | Disposition: A | Discharge status: Home / Self Care | Payer: Medicaid Other | Attending: Family Medicine | Admitting: Family Medicine

## 2017-08-15 ENCOUNTER — Other Ambulatory Visit: Payer: Self-pay

## 2017-08-15 DIAGNOSIS — J441 Chronic obstructive pulmonary disease with (acute) exacerbation: Secondary | ICD-10-CM | POA: Diagnosis not present

## 2017-08-15 DIAGNOSIS — J209 Acute bronchitis, unspecified: Secondary | ICD-10-CM | POA: Diagnosis not present

## 2017-08-15 DIAGNOSIS — J42 Unspecified chronic bronchitis: Principal | ICD-10-CM

## 2017-08-15 MED ORDER — BENZONATATE 200 MG PO CAPS: 200.0000 mg | ORAL_CAPSULE | Freq: Three times a day (TID) | ORAL | 0 refills | Status: DC

## 2017-08-15 MED ORDER — AZITHROMYCIN 250 MG PO TABS: 250.0000 mg | ORAL_TABLET | Freq: Every day | ORAL | 0 refills | Status: DC

## 2017-08-15 MED ORDER — PREDNISONE 50 MG PO TABS: 50.0000 mg | ORAL_TABLET | Freq: Every day | ORAL | 0 refills | Status: AC

## 2017-08-15 MED ORDER — ALBUTEROL SULFATE HFA 108 (90 BASE) MCG/ACT IN AERS: 1.0000 | INHALATION_SPRAY | Freq: Four times a day (QID) | RESPIRATORY_TRACT | 0 refills | Status: DC | PRN

## 2017-08-15 MED FILL — AZITHROMYCIN 250 MG TABLET: 250 | 5 days supply | Qty: 6 | Fill #0

## 2017-08-15 MED FILL — PROAIR HFA 90 MCG INHALER: 108 (90 BAS | 25 days supply | Qty: 9 | Fill #0

## 2017-08-15 MED FILL — predniSONE 10 MG TABS: 10 | 5 days supply | Qty: 25 | Fill #0

## 2017-08-15 NOTE — ED Provider Notes (Signed)
[place]    CSN: [id number] Arrival date & time: 08/15/17  1108     History   Chief Complaint Chief Complaint  Patient presents with  . Cough    HPI Catherine Pope is a 52 y.o. female history of tobacco use, hypertension, diabetes mellitus presenting today for evaluation of cough.  Patient has had a cough for the past 3 weeks.  She has been taking Robitussin without relief.  She is also endorsing congestion, but mild rhinorrhea.  Denies sore throat.  Denies fevers.  Eating and drinking like normal.  Patient smokes approximately half pack a day.  Working on quitting, taking Wellbutrin.  Denies history of previous blood clot.  Denies any leg swelling.  HPI  Past Medical History:  Diagnosis Date  . Anxiety   . Depression   . Diabetes mellitus    dx back in 2010  . Hypertension   . Migraine   . Sinusitis     Patient Active Problem List   Diagnosis Date Noted  . Cervical vertebral fusion 12/27/2016  . Essential hypertension 03/23/2016  . Laryngitis, acute 03/23/2016  . Neuropathy 03/23/2016  . Tobacco dependence 03/23/2016  . Diabetes mellitus ([place]) 11/26/2013  . Morbid obesity ([place]) 11/26/2013  . Migraine without aura 11/26/2013  . Other and unspecified hyperlipidemia 11/26/2013    Past Surgical History:  Procedure Laterality Date  . ABDOMINAL HYSTERECTOMY    . ANTERIOR CERVICAL DECOMP/DISCECTOMY FUSION N/A 12/27/2016   Procedure: CERVICAL SIX-SEVEN ANTERIOR CERVICAL DECOMPRESSION/DISCECTOMY FUSION;  Surgeon: Eustace Moore, MD;  Location: [facility] [facility];  Service: Neurosurgery;  Laterality: N/A;  . ANTERIOR CRUCIATE LIGAMENT REPAIR    . BREAST SURGERY     2 lumps removed at age 89, left breast  . SHOULDER ARTHROSCOPY     right shoulder    OB History   None      Home Medications    Prior to Admission medications   Medication Sig Start Date End Date Taking? Authorizing Provider  albuterol (PROVENTIL HFA;VENTOLIN HFA) 108 (90 Base) MCG/ACT inhaler  Inhale 1-2 puffs into the lungs every 6 (six) hours as needed for wheezing or shortness of breath. 08/15/17   Naksh Radi C, PA-C  azithromycin (ZITHROMAX) 250 MG tablet Take 1 tablet (250 mg total) by mouth daily. Take first 2 tablets together, then 1 every day until finished. 08/15/17   Gabreille Dardis C, PA-C  benzonatate (TESSALON) 200 MG capsule Take 1 capsule (200 mg total) by mouth every 8 (eight) hours. 08/15/17   Arbie Blankley C, PA-C  Blood Glucose Monitoring Suppl (TRUE METRIX AIR GLUCOSE METER) w/Device KIT 1 each by Does not apply route 4 (four) times daily - after meals and at bedtime. 03/23/16   Dorena Dew, FNP  buPROPion [facility] SR) 150 MG 12 hr tablet TAKE 1 TABLET(150 MG) BY MOUTH TWICE DAILY 06/25/17   Scot Jun, FNP  busPIRone (BUSPAR) 15 MG tablet TAKE 1 TABLET(15 MG) BY MOUTH THREE TIMES DAILY 06/18/17   Scot Jun, FNP  cetirizine (ZYRTEC) 10 MG tablet Take 1 tablet (10 mg total) by mouth daily. 12/25/16   Scot Jun, FNP  Cholecalciferol (VITAMIN D PO) Take 50 Units by mouth daily.    [provider]  cyclobenzaprine (FLEXERIL) 10 MG tablet Take 1 tablet (10 mg total) by mouth 3 (three) times daily as needed for muscle spasms. 11/21/16   Scot Jun, FNP  cyclobenzaprine (FLEXERIL) 10 MG tablet TAKE 1 TABLET BY MOUTH 3 TIMES  DAILY AS NEEDED (NECK PAIN). 07/30/17   Dorena Dew, FNP  Diclofenac Sodium 3 % GEL Place 1 application onto the skin 3 (three) times daily as needed. 04/27/17   Robyn Haber, MD  gabapentin (NEURONTIN) 300 MG capsule Take 1 capsule (300 mg total) by mouth at bedtime. 10/17/16   Scot Jun, FNP  Garlic 3244 MG CAPS Take 1,000 mg by mouth daily.    [provider]  glucose blood (TRUE METRIX BLOOD GLUCOSE TEST) test strip Use as instructed 03/23/16   Dorena Dew, FNP  guaiFENesin (ROBITUSSIN) 100 MG/5ML SOLN Take 5 mLs (100 mg total) by mouth every 4 (four) hours as needed for  cough or to loosen phlegm. 07/30/17   Dorena Dew, FNP  hydrochlorothiazide (HYDRODIURIL) 12.5 MG tablet Take 1 tablet (12.5 mg total) by mouth daily. 10/17/16   Scot Jun, FNP  insulin detemir (LEVEMIR) 100 UNIT/ML injection INJECT 60 UNITS INTO THE SKIN TWICE DAILY EVERY 12 HOURS 07/19/17   Dorena Dew, FNP  ipratropium (ATROVENT) 0.03 % nasal spray Place 2 sprays into both nostrils 2 (two) times daily. 10/17/16   Scot Jun, FNP  Lancets [facility] ULTRASOFT) lancets Use as instructed 06/22/14   Ernestina Patches, MD  lisinopril (PRINIVIL,ZESTRIL) 20 MG tablet Take 1 tablet (20 mg total) by mouth daily. 10/17/16   Scot Jun, FNP  lovastatin (MEVACOR) 20 MG tablet Take 1 tablet (20 mg total) by mouth at bedtime. 10/17/16   Scot Jun, FNP  metFORMIN (GLUCOPHAGE) 1000 MG tablet 500 mg with breakfast and 1000 mg at dinnertime Patient taking differently: Take 500-1,000 mg by mouth 2 (two) times daily with a meal. 500 mg with breakfast and 1000 mg at dinnertime 10/20/16   Scot Jun, FNP  metoprolol tartrate (LOPRESSOR) 50 MG tablet Take 50 mg by mouth 2 (two) times daily.    [provider]  pravastatin (PRAVACHOL) 40 MG tablet Take 40 mg by mouth daily. 04/17/14   [provider]  predniSONE (DELTASONE) 50 MG tablet Take 1 tablet (50 mg total) by mouth daily for 5 days. 08/15/17 08/20/17  Halimah Bewick C, PA-C  propranolol ER (INDERAL LA) 80 MG 24 hr capsule Take 80 mg by mouth daily. 04/17/14   [provider]  QUEtiapine (SEROQUEL) 100 MG tablet TAKE 1 TABLET BY MOUTH AT BEDTIME. 06/27/17   Scot Jun, FNP  topiramate (TOPAMAX) 50 MG tablet TAKE 1 TABLET BY MOUTH 2 (TWO) TIMES DAILY. 07/30/17   Dorena Dew, FNP  TRADJENTA 5 MG TABS tablet TAKE 1 TABLET BY MOUTH DAILY. 03/21/17   Scot Jun, FNP  TRUEPLUS INSULIN SYRINGE 30G X 5/16" 0.3 ML MISC USE AS DIRECTED. 02/19/17   Dorena Dew, FNP  Vitamin D,  Ergocalciferol, (DRISDOL) 50000 units CAPS capsule Take 1 capsule (50,000 Units total) by mouth every 7 (seven) days. 06/20/17   Scot Jun, FNP    Family History Family History  Problem Relation Age of Onset  . Heart Problems Father   . Hypertension Other   . Heart disease Other     Social History Social History   Tobacco Use  . Smoking status: Current Every Day Smoker    Packs/day: 0.50    Years: 15.00    Pack years: 7.50    Types: Cigarettes  . Smokeless tobacco: Never Used  Substance Use Topics  . Alcohol use: No  . Drug use: No     Allergies  Patient has no known allergies.   Review of Systems Review of Systems  Constitutional: Negative for chills, fatigue and fever.  HENT: Positive for congestion. Negative for ear pain, rhinorrhea, sinus pressure, sore throat and trouble swallowing.   Respiratory: Positive for cough. Negative for chest tightness and shortness of breath.   Cardiovascular: Negative for chest pain.  Gastrointestinal: Negative for abdominal pain, nausea and vomiting.  Musculoskeletal: Negative for myalgias.  Skin: Negative for rash.  Neurological: Negative for dizziness, light-headedness and headaches.     Physical Exam Triage Vital Signs ED Triage Vitals  Enc Vitals Group     BP 08/15/17 1126 (!) 156/75     Pulse Rate 08/15/17 1126 (!) 109     Resp 08/15/17 1126 20     Temp 08/15/17 1126 98.4 F (36.9 C)     Temp Source 08/15/17 1126 Oral     SpO2 08/15/17 1126 100 %     Weight --      Height --      Head Circumference --      Peak Flow --      Pain Score 08/15/17 1124 7     Pain Loc --      Pain Edu? --      Excl. in [place]? --    No data found.  Updated Vital Signs BP (!) 156/75 (BP Location: Left Arm)   Pulse (!) 109   Temp 98.4 F (36.9 C) (Oral)   Resp 20   SpO2 100%   Visual Acuity Right Eye Distance:   Left Eye Distance:   Bilateral Distance:    Right Eye Near:   Left Eye Near:    Bilateral Near:      Physical Exam  Constitutional: She appears well-developed and well-nourished. No distress.  HENT:  Head: Normocephalic and atraumatic.  Bilateral ears without tenderness to palpation of external auricle, tragus and mastoid, EAC's without erythema or swelling, TM's with good bony landmarks and cone of light. Non erythematous.  Nasal mucosa erythematous with rhinorrhea present bilaterally, swollen turbinates.  Oral mucosa pink and moist, no tonsillar enlargement or exudate. Posterior pharynx patent and nonerythematous, no uvula deviation or swelling. Normal phonation.   Eyes: Conjunctivae are normal.  Neck: Neck supple.  Cardiovascular: Regular rhythm.  No murmur heard. Tachycardic  Pulmonary/Chest: Effort normal and breath sounds normal. No respiratory distress.  Breathing comfortably at rest, decreased breath sounds noted to left lower area, no wheezing, rales or other adventitious sounds auscultated   Abdominal: Soft. There is no tenderness.  Musculoskeletal: She exhibits no edema.  Neurological: She is alert.  Skin: Skin is warm and dry.  Psychiatric: She has a normal mood and affect.  Nursing note and vitals reviewed.    UC Treatments / Results  Labs (all labs ordered are listed, but only abnormal results are displayed) Labs Reviewed - No data to display  EKG None  Radiology Dg Chest 2 View  Result Date: 08/15/2017 CLINICAL DATA:  Nonproductive cough, chest congestion, and shortness of breath for several weeks. Current smoker. Decreased lung sounds on the left. EXAM: CHEST - 2 VIEW COMPARISON:  Chest x-ray of July 12, 2017 FINDINGS: The lungs are adequately inflated. The interstitial markings are coarse though stable. The heart and pulmonary vascularity are normal. The mediastinum is normal in width. There is no pleural effusion. The bony thorax exhibits no acute abnormality. The patient has undergone previous lower anterior cervical fusion. IMPRESSION: Chronic  bronchitic changes, stable. There is no acute  cardiopulmonary abnormality. Electronically Signed   By: David  Martinique M.D.   On: 08/15/2017 12:29    Procedures Procedures (including critical care time)  Medications Ordered in UC Medications - No data to display  Initial Impression / Assessment and Plan / UC Course  I have reviewed the triage vital signs and the nursing notes.  Pertinent labs & imaging results that were available during my care of the patient were reviewed by me and considered in my medical decision making (see chart for details).     X-ray showing chronic bronchitis, underlying COPD.  Likely having acute on chronic bronchitis.   will provide patient with albuterol inhaler, prednisone for 5 days.  Tessalon as needed for cough.  We will also initiate a course of azithromycin given length of symptoms as well as increased sputum production. Discussed strict return precautions. Patient verbalized understanding and is agreeable with plan.  Final Clinical Impressions(s) / UC Diagnoses   Final diagnoses:  Chronic bronchitis with acute exacerbation [facility])     Discharge Instructions     Begin azithromycin Prednisone daily for 5 days with food Albuterol inhaler as need for shortness of breath Tessalon as needed for cough May try taking a decongestant like mucinex or sudafed to help with congestion.    ED Prescriptions    Medication Sig Dispense Auth. Provider   predniSONE (DELTASONE) 50 MG tablet Take 1 tablet (50 mg total) by mouth daily for 5 days. 5 tablet Alaila Pillard C, PA-C   albuterol (PROVENTIL HFA;VENTOLIN HFA) 108 (90 Base) MCG/ACT inhaler Inhale 1-2 puffs into the lungs every 6 (six) hours as needed for wheezing or shortness of breath. 1 Inhaler Virdia Ziesmer C, PA-C   azithromycin (ZITHROMAX) 250 MG tablet Take 1 tablet (250 mg total) by mouth daily. Take first 2 tablets together, then 1 every day until finished. 6 tablet Kaan Tosh C, PA-C    benzonatate (TESSALON) 200 MG capsule Take 1 capsule (200 mg total) by mouth every 8 (eight) hours. 28 capsule Larkyn Greenberger C, PA-C     Controlled Substance Prescriptions [place] Controlled Substance Registry consulted? Not Applicable   Janith Lima, [place] 08/15/17 1244

## 2017-08-15 NOTE — Discharge Instructions (Signed)
Begin azithromycin Prednisone daily for 5 days with food Albuterol inhaler as need for shortness of breath Tessalon as needed for cough May try taking a decongestant like mucinex or sudafed to help with congestion.

## 2017-08-15 NOTE — ED Triage Notes (Signed)
The patient presented to the [facility] with a complaint of a cough x 3 weeks.

## 2017-08-22 ENCOUNTER — Other Ambulatory Visit: Payer: Self-pay | Admitting: Family Medicine

## 2017-08-22 MED FILL — CETIRIZINE HCL 10 MG TABS: 10 | 30 days supply | Qty: 30 | Fill #7

## 2017-08-22 MED FILL — TRADJENTA 5 MG TABLET: 5 | 30 days supply | Qty: 30 | Fill #0

## 2017-08-22 MED FILL — metFORMIN HCL 500 MG TABS: 500 | 30 days supply | Qty: 90 | Fill #6

## 2017-08-22 MED FILL — QUETIAPINE FUMARATE 100 MG: 100 | 30 days supply | Qty: 30 | Fill #2

## 2017-09-04 ENCOUNTER — Ambulatory Visit: Payer: Medicaid Other | Admitting: Endocrinology

## 2017-09-04 ENCOUNTER — Other Ambulatory Visit: Payer: Self-pay

## 2017-09-04 ENCOUNTER — Telehealth: Payer: Self-pay | Admitting: Endocrinology

## 2017-09-04 ENCOUNTER — Encounter: Payer: Self-pay | Admitting: Endocrinology

## 2017-09-04 VITALS — BP 150/84 | HR 107 | Wt 236.0 lb

## 2017-09-04 DIAGNOSIS — Z9114 Patient's other noncompliance with medication regimen: Secondary | ICD-10-CM | POA: Diagnosis not present

## 2017-09-04 DIAGNOSIS — Z794 Long term (current) use of insulin: Secondary | ICD-10-CM | POA: Diagnosis not present

## 2017-09-04 DIAGNOSIS — I10 Essential (primary) hypertension: Secondary | ICD-10-CM

## 2017-09-04 DIAGNOSIS — E119 Type 2 diabetes mellitus without complications: Secondary | ICD-10-CM | POA: Diagnosis not present

## 2017-09-04 DIAGNOSIS — E118 Type 2 diabetes mellitus with unspecified complications: Secondary | ICD-10-CM

## 2017-09-04 MED ORDER — INSULIN GLARGINE 100 UNITS/ML SOLOSTAR PEN: 120.0000 [IU] | PEN_INJECTOR | Freq: Every day | SUBCUTANEOUS | 11 refills | Status: DC

## 2017-09-04 MED ORDER — BASAGLAR KWIKPEN 100 UNIT/ML ~~LOC~~ SOPN: 120.0000 [IU] | PEN_INJECTOR | Freq: Every day | SUBCUTANEOUS | 11 refills | Status: DC

## 2017-09-04 NOTE — Telephone Encounter (Signed)
Patient stated, the pharmacy haven't received insulin, she forgot the name of it.  Please resend [place], Catherine [facility] [employer]

## 2017-09-04 NOTE — Telephone Encounter (Signed)
I have sent Lantus instead of Basaglar.

## 2017-09-04 NOTE — Progress Notes (Signed)
Subjective:    Patient ID: Catherine Pope, female    DOB: 01-05-1966, 52 y.o.   MRN: [id number]  HPI pt is referred by Claudine Mouton, FNP, for diabetes.  Pt states DM was dx'ed in 2007; she has mild if any neuropathy of the lower extremities; she is unaware of any associated chronic complications; she has been on insulin since 2018; pt says her diet and exercise are fair; she has never had GDM, pancreatitis, pancreatic surgery, severe hypoglycemia or DKA.  Pt says she sometimes misses the insulin.  She gets meds at [facility].  She says cbg's are persistently over 300.   Past Medical History:  Diagnosis Date  . Anxiety   . Depression   . Diabetes mellitus    dx back in 2010  . Hypertension   . Migraine   . Sinusitis     Past Surgical History:  Procedure Laterality Date  . ABDOMINAL HYSTERECTOMY    . ANTERIOR CERVICAL DECOMP/DISCECTOMY FUSION N/A 12/27/2016   Procedure: CERVICAL SIX-SEVEN ANTERIOR CERVICAL DECOMPRESSION/DISCECTOMY FUSION;  Surgeon: Eustace Moore, MD;  Location: [place];  Service: Neurosurgery;  Laterality: N/A;  . ANTERIOR CRUCIATE LIGAMENT REPAIR    . BREAST SURGERY     2 lumps removed at age 66, left breast  . SHOULDER ARTHROSCOPY     right shoulder    Social History   Socioeconomic History  . Marital status: Single    Spouse name: Not on file  . Number of children: 2  . Years of education: 34 th  . Highest education level: Not on file  Occupational History  . Occupation: unemployed  Social Needs  . Financial resource strain: Not on file  . Food insecurity:    Worry: Sometimes true    Inability: Sometimes true  . Transportation needs:    Medical: Not on file    Non-medical: Not on file  Tobacco Use  . Smoking status: Current Every Day Smoker    Packs/day: 0.50    Years: 15.00    Pack years: 7.50    Types: Cigarettes  . Smokeless tobacco: Never Used  Substance and Sexual Activity  . Alcohol use: No  . Drug use: No  . Sexual activity: Not on file    Lifestyle  . Physical activity:    Days per week: Not on file    Minutes per session: Not on file  . Stress: Not on file  Relationships  . Social connections:    Talks on phone: Not on file    Gets together: Not on file    Attends religious service: Not on file    Active member of club or organization: Not on file    Attends meetings of clubs or organizations: Not on file    Relationship status: Not on file  . Intimate partner violence:    Fear of current or ex partner: Not on file    Emotionally abused: Not on file    Physically abused: Not on file    Forced sexual activity: Not on file  Other Topics Concern  . Not on file  Social History Narrative   Patient lives at home with her daughters and she is unemployed. Patient has two children.   Patient has a high school education.   Right handed.    Caffeine two cups daily.    Current Outpatient Medications on File Prior to Visit  Medication Sig Dispense Refill  . albuterol (PROVENTIL HFA;VENTOLIN HFA) 108 (90 Base) MCG/ACT inhaler Inhale 1-2  puffs into the lungs every 6 (six) hours as needed for wheezing or shortness of breath. 1 Inhaler 0  . cetirizine (ZYRTEC) 10 MG tablet Take 1 tablet (10 mg total) by mouth daily. 30 tablet 11  . Cholecalciferol (VITAMIN D PO) Take 50 Units by mouth daily.    . cyclobenzaprine (FLEXERIL) 10 MG tablet Take 1 tablet (10 mg total) by mouth 3 (three) times daily as needed for muscle spasms. 90 tablet 0  . gabapentin (NEURONTIN) 300 MG capsule Take 1 capsule (300 mg total) by mouth at bedtime. 90 capsule 1  . Garlic 0240 MG CAPS Take 1,000 mg by mouth daily.    Marland Kitchen glucose blood (TRUE METRIX BLOOD GLUCOSE TEST) test strip Use as instructed 100 each 12  . guaiFENesin (ROBITUSSIN) 100 MG/5ML SOLN Take 5 mLs (100 mg total) by mouth every 4 (four) hours as needed for cough or to loosen phlegm. 1200 mL 0  . hydrochlorothiazide (HYDRODIURIL) 12.5 MG tablet Take 1 tablet (12.5 mg total) by mouth daily. 90  tablet 3  . ipratropium (ATROVENT) 0.03 % nasal spray Place 2 sprays into both nostrils 2 (two) times daily. 30 mL 0  . Lancets (ONETOUCH ULTRASOFT) lancets Use as instructed 100 each 12  . lisinopril (PRINIVIL,ZESTRIL) 20 MG tablet Take 1 tablet (20 mg total) by mouth daily. 90 tablet 3  . lovastatin (MEVACOR) 20 MG tablet Take 1 tablet (20 mg total) by mouth at bedtime. 90 tablet 3  . metoprolol tartrate (LOPRESSOR) 50 MG tablet Take 50 mg by mouth 2 (two) times daily.    . pravastatin (PRAVACHOL) 40 MG tablet Take 40 mg by mouth daily.    . propranolol ER (INDERAL LA) 80 MG 24 hr capsule Take 80 mg by mouth daily.    . QUEtiapine (SEROQUEL) 100 MG tablet TAKE 1 TABLET BY MOUTH AT BEDTIME. 30 tablet 4  . topiramate (TOPAMAX) 50 MG tablet TAKE 1 TABLET BY MOUTH 2 (TWO) TIMES DAILY. 60 tablet 3  . TRUEPLUS INSULIN SYRINGE 30G X 5/16" 0.3 ML MISC USE AS DIRECTED. 100 each 2  . Vitamin D, Ergocalciferol, (DRISDOL) 50000 units CAPS capsule Take 1 capsule (50,000 Units total) by mouth every 7 (seven) days. 12 capsule 0   No current facility-administered medications on file prior to visit.     No Known Allergies  Family History  Problem Relation Age of Onset  . Heart Problems Father   . Diabetes Father   . Hypertension Other   . Heart disease Other     BP (!) 150/84   Pulse (!) 107   Wt 236 lb (107 kg)   SpO2 98%   BMI 38.09 kg/m     Review of Systems denies weight loss, headache, chest pain, sob, n/v, muscle cramps, excessive diaphoresis, memory loss, cold intolerance, and easy bruising.  She has blurry vision, urinary frequency, depression, nasal congestion, and chronic vaginal itching.      Objective:   Physical Exam VS: see vs page GEN: no distress HEAD: head: no deformity eyes: no periorbital swelling, no proptosis external nose and ears are normal mouth: no lesion seen NECK: supple, thyroid is not enlarged CHEST WALL: no deformity LUNGS: clear to auscultation CV: reg  rate and rhythm, no murmur ABD: abdomen is soft, nontender.  no hepatosplenomegaly.  not distended.  no hernia MUSCULOSKELETAL: muscle bulk and strength are grossly normal.  no obvious joint swelling.  gait is normal and steady EXTEMITIES: no deformity.  no ulcer on the feet.  feet are of normal color and temp.  no edema PULSES: dorsalis pedis intact bilat.  no carotid bruit NEURO:  cn 2-12 grossly intact.   readily moves all 4's.  sensation is intact to touch on the feet SKIN:  Normal texture and temperature.  No rash or suspicious lesion is visible.   NODES:  None palpable at the neck.  PSYCH: alert, well-oriented.  Does not appear anxious nor depressed.    Lab Results  Component Value Date   HGBA1C 14.7 07/19/2017   I personally reviewed electrocardiogram tracing (12/18/16): Indication: preop Impression: ST.  No MI.  No hypertrophy. No comparison is available  I have reviewed outside records, and summarized: Pt was noted to have elevated a1c, and referred here.  She was noted to have anxiety, depression, and med noncompliance.       Assessment & Plan:  Insulin-requiring type 2 DM: severe exacerbation.   Noncompliance with insulin: We'll try a simpler insulin regimen.  HTN: is noted today  Patient Instructions  Your blood pressure is high today.  Please see your primary care provider soon, to have it rechecked.   I have sent a prescription to your pharmacy, to change your insulin to "Basaglar," 120 units every morning. you can stop taking the tradjenta and metformin.  check your blood sugar twice a day.  vary the time of day when you check, between before the 3 meals, and at bedtime.  also check if you have symptoms of your blood sugar being too high or too low.  please keep a record of the readings and bring it to your next appointment here (or you can bring the meter itself).  You can write it on any piece of paper.  please call us sooner if your blood sugar goes below 70, or if  you have a lot of readings over 200. Please call or message [place] next week, to tell us how the blood sugar is doing.   Please come back for a follow-up appointment in 2 months.

## 2017-09-04 NOTE — Telephone Encounter (Signed)
I have sent Basaglar 120 units to patient's pharmacy per Dr. Cordelia Pen notes even though initial prescription wasn't sent.

## 2017-09-04 NOTE — Patient Instructions (Addendum)
Your blood pressure is high today.  Please see your primary care provider soon, to have it rechecked.   I have sent a prescription to your pharmacy, to change your insulin to "Basaglar," 120 units every morning. you can stop taking the tradjenta and metformin.  check your blood sugar twice a day.  vary the time of day when you check, between before the 3 meals, and at bedtime.  also check if you have symptoms of your blood sugar being too high or too low.  please keep a record of the readings and bring it to your next appointment here (or you can bring the meter itself).  You can write it on any piece of paper.  please call us sooner if your blood sugar goes below 70, or if you have a lot of readings over 200. Please call or message [place] next week, to tell us how the blood sugar is doing.   Please come back for a follow-up appointment in 2 months.

## 2017-09-04 NOTE — Telephone Encounter (Signed)
Pharmacy would like [place] to send over LANTUS for the patient instead of Basaglar due to insurance.      [place], [place] [address] [employer]

## 2017-09-05 ENCOUNTER — Other Ambulatory Visit: Payer: Self-pay | Admitting: Family Medicine

## 2017-09-05 DIAGNOSIS — G629 Polyneuropathy, unspecified: Secondary | ICD-10-CM

## 2017-09-05 MED FILL — HYDROCHLOROTHIAZIDE 12.5 MG: 12.5 | 30 days supply | Qty: 30 | Fill #5

## 2017-09-05 MED FILL — LOVASTATIN 20 MG TABLET: 20 | 30 days supply | Qty: 30 | Fill #10

## 2017-09-05 MED FILL — GABAPENTIN 300 MG CAPSULE: 300 | 30 days supply | Qty: 30 | Fill #0

## 2017-09-05 MED FILL — TOPIRAMATE 50 MG TABLET: 50 | 30 days supply | Qty: 60 | Fill #1

## 2017-09-05 MED FILL — CYCLOBENZAPRINE 10 MG TAB: 10 | 30 days supply | Qty: 90 | Fill #0

## 2017-09-06 ENCOUNTER — Telehealth: Payer: Self-pay | Admitting: Endocrinology

## 2017-09-06 NOTE — Telephone Encounter (Signed)
Patient stated that insurance will not cover insulin ell[place] need to choose a different type of insulin..  Patient did not know the name of the insulin.  Please advise

## 2017-09-07 ENCOUNTER — Other Ambulatory Visit: Payer: Self-pay

## 2017-09-07 MED ORDER — BUPROPION HCL ER (SR) 150 MG PO TB12: ORAL_TABLET | ORAL | 0 refills | Status: DC

## 2017-09-07 MED ORDER — INSULIN GLARGINE 100 UNITS/ML SOLOSTAR PEN: 120.0000 [IU] | PEN_INJECTOR | Freq: Every day | SUBCUTANEOUS | 11 refills | Status: DC

## 2017-09-07 MED ORDER — BUSPIRONE HCL 15 MG PO TABS: ORAL_TABLET | ORAL | 2 refills | Status: DC

## 2017-09-07 MED ORDER — BASAGLAR KWIKPEN 100 UNIT/ML ~~LOC~~ SOPN: PEN_INJECTOR | SUBCUTANEOUS | 11 refills | Status: DC

## 2017-09-07 NOTE — Telephone Encounter (Signed)
I have changed & sent in basaglar to pharmacy.

## 2017-09-07 NOTE — Telephone Encounter (Signed)
Received a fax that lantus is not covered by insurance and to switch pt to basaglar, so basaglar was sent to pharmacy. Another fax was received stating that basaglar was not covered and to switch pt lantus so I called pharmacy to verify which is covered and she stated that lantus is so pt needs to be switched back to lantus.

## 2017-09-11 ENCOUNTER — Other Ambulatory Visit: Payer: Self-pay

## 2017-09-11 ENCOUNTER — Telehealth: Payer: Self-pay | Admitting: Endocrinology

## 2017-09-11 MED ORDER — INSULIN GLARGINE 100 UNITS/ML SOLOSTAR PEN: 120.0000 [IU] | PEN_INJECTOR | Freq: Every day | SUBCUTANEOUS | 11 refills | Status: DC

## 2017-09-11 MED FILL — LANTUS SOLOSTAR 100 UNITS/M: 100 | 24 days supply | Qty: 30 | Fill #0

## 2017-09-11 NOTE — Telephone Encounter (Signed)
[facility] ph# [phone] called re: they still have not received a RX for Lantus (Basaglar had to be changed to Lantus). The patient is completely out of medication. Please send Rx for Lantus asap to above pharm.

## 2017-09-11 NOTE — Telephone Encounter (Signed)
I have faxed over to [facility] because prescription will not print.

## 2017-09-12 ENCOUNTER — Other Ambulatory Visit: Payer: Self-pay

## 2017-09-12 ENCOUNTER — Encounter (INDEPENDENT_AMBULATORY_CARE_PROVIDER_SITE_OTHER): Payer: Self-pay | Admitting: Physician Assistant

## 2017-09-12 ENCOUNTER — Ambulatory Visit (INDEPENDENT_AMBULATORY_CARE_PROVIDER_SITE_OTHER): Payer: Medicaid Other | Admitting: Physician Assistant

## 2017-09-12 VITALS — BP 146/85 | HR 101 | Temp 98.4°F | Ht 66.0 in | Wt 235.4 lb

## 2017-09-12 DIAGNOSIS — M5127 Other intervertebral disc displacement, lumbosacral region: Secondary | ICD-10-CM

## 2017-09-12 DIAGNOSIS — G629 Polyneuropathy, unspecified: Secondary | ICD-10-CM | POA: Diagnosis not present

## 2017-09-12 DIAGNOSIS — Z794 Long term (current) use of insulin: Secondary | ICD-10-CM | POA: Diagnosis not present

## 2017-09-12 DIAGNOSIS — E119 Type 2 diabetes mellitus without complications: Secondary | ICD-10-CM

## 2017-09-12 LAB — GLUCOSE, POCT (MANUAL RESULT ENTRY): POC Glucose: 362 mg/dl — AB (ref 70–99)

## 2017-09-12 MED ORDER — GABAPENTIN 300 MG PO CAPS: 300.0000 mg | ORAL_CAPSULE | Freq: Three times a day (TID) | ORAL | 5 refills | Status: DC

## 2017-09-12 NOTE — Patient Instructions (Signed)
Sciatica Sciatica is pain, numbness, weakness, or tingling along the path of the sciatic nerve. The sciatic nerve starts in the lower back and runs down the back of each leg. The nerve controls the muscles in the lower leg and in the back of the knee. It also provides feeling (sensation) to the back of the thigh, the lower leg, and the sole of the foot. Sciatica is a symptom of another medical condition that pinches or puts pressure on the sciatic nerve. Generally, sciatica only affects one side of the body. Sciatica usually goes away on its own or with treatment. In some cases, sciatica may keep coming back (recur). What are the causes? This condition is caused by pressure on the sciatic nerve, or pinching of the sciatic nerve. This may be the result of:  A disk in between the bones of the spine (vertebrae) bulging out too far (herniated disk).  Age-related changes in the spinal disks (degenerative disk disease).  A pain disorder that affects a muscle in the buttock (piriformis syndrome).  Extra bone growth (bone spur) near the sciatic nerve.  An injury or break (fracture) of the pelvis.  Pregnancy.  Tumor (rare).  What increases the risk? The following factors may make you more likely to develop this condition:  Playing sports that place pressure or stress on the spine, such as football or weight lifting.  Having poor strength and flexibility.  A history of back injury.  A history of back surgery.  Sitting for long periods of time.  Doing activities that involve repetitive bending or lifting.  Obesity.  What are the signs or symptoms? Symptoms can vary from mild to very severe, and they may include:  Any of these problems in the lower back, leg, hip, or buttock: ? Mild tingling or dull aches. ? Burning sensations. ? Sharp pains.  Numbness in the back of the calf or the sole of the foot.  Leg weakness.  Severe back pain that makes movement difficult.  These  symptoms may get worse when you cough, sneeze, or laugh, or when you sit or stand for long periods of time. Being overweight may also make symptoms worse. In some cases, symptoms may recur over time. How is this diagnosed? This condition may be diagnosed based on:  Your symptoms.  A physical exam. Your health care provider may ask you to do certain movements to check whether those movements trigger your symptoms.  You may have tests, including: ? Blood tests. ? X-rays. ? MRI. ? CT scan.  How is this treated? In many cases, this condition improves on its own, without any treatment. However, treatment may include:  Reducing or modifying physical activity during periods of pain.  Exercising and stretching to strengthen your abdomen and improve the flexibility of your spine.  Icing and applying heat to the affected area.  Medicines that help: ? To relieve pain and swelling. ? To relax your muscles.  Injections of medicines that help to relieve pain, irritation, and inflammation around the sciatic nerve (steroids).  Surgery.  Follow these instructions at home: Medicines  Take over-the-counter and prescription medicines only as told by your health care provider.  Do not drive or operate heavy machinery while taking prescription pain medicine. Managing pain  If directed, apply ice to the affected area. ? Put ice in a plastic bag. ? Place a towel between your skin and the bag. ? Leave the ice on for 20 minutes, 2-3 times a day.  After icing, apply   heat to the affected area before you exercise or as often as told by your health care provider. Use the heat source that your health care provider recommends, such as a moist heat pack or a heating pad. ? Place a towel between your skin and the heat source. ? Leave the heat on for 20-30 minutes. ? Remove the heat if your skin turns bright red. This is especially important if you are unable to feel pain, heat, or cold. You may have a  greater risk of getting burned. Activity  Return to your normal activities as told by your health care provider. Ask your health care provider what activities are safe for you. ? Avoid activities that make your symptoms worse.  Take brief periods of rest throughout the day. Resting in a lying or standing position is usually better than sitting to rest. ? When you rest for longer periods, mix in some mild activity or stretching between periods of rest. This will help to prevent stiffness and pain. ? Avoid sitting for long periods of time without moving. Get up and move around at least one time each hour.  Exercise and stretch regularly, as told by your health care provider.  Do not lift anything that is heavier than 10 lb (4.5 kg) while you have symptoms of sciatica. When you do not have symptoms, you should still avoid heavy lifting, especially repetitive heavy lifting.  When you lift objects, always use proper lifting technique, which includes: ? Bending your knees. ? Keeping the load close to your body. ? Avoiding twisting. General instructions  Use good posture. ? Avoid leaning forward while sitting. ? Avoid hunching over while standing.  Maintain a healthy weight. Excess weight puts extra stress on your back and makes it difficult to maintain good posture.  Wear supportive, comfortable shoes. Avoid wearing high heels.  Avoid sleeping on a mattress that is too soft or too hard. A mattress that is firm enough to support your back when you sleep may help to reduce your pain.  Keep all follow-up visits as told by your health care provider. This is important. Contact a health care provider if:  You have pain that wakes you up when you are sleeping.  You have pain that gets worse when you lie down.  Your pain is worse than you have experienced in the past.  Your pain lasts longer than 4 weeks.  You experience unexplained weight loss. Get help right away if:  You lose control  of your bowel or bladder (incontinence).  You have: ? Weakness in your lower back, pelvis, buttocks, or legs that gets worse. ? Redness or swelling of your back. ? A burning sensation when you urinate. This information is not intended to replace advice given to you by your health care provider. Make sure you discuss any questions you have with your health care provider. Document Released: 03/14/2001 Document Revised: 08/24/2015 Document Reviewed: 11/27/2014 Elsevier Interactive Patient Education  2018 [employer]  

## 2017-09-12 NOTE — Progress Notes (Signed)
Subjective:  Patient ID: Catherine Pope, female    DOB: Dec 14, 1965  Age: 52 y.o. MRN: [id number]  CC: DM2 and sciatica  HPI Catherine Pope is a 52 y.o. female with a medical history of DM2, HTN, anxiety, depression, migraines, tobacco use disorder, sinusitis, trochanteric bursitis, lateral epicondylitis, s/p surgical decompression of c-spine, and morbid obesity presents as a new patient for management of her DM2. Last A1c 14.7% seven weeks ago. Was prescribed Levemir 60 units q12 hrs and referred to endocrinology. Saw endocrinologist and was advised to inject 120 units qhs. Told to stop Metformin and Tradjenta. She will f/u with endocrinology in three weeks. So far, blood sugars at home have been in the 300s.      Complains of bilateral sciatica since five years ago. She had previously received injections to the back two months ago with little to no relief. Does not yet have a f/u with her orthopedic specialist. Says she was denied to have PT by her insurance. She is worried about her sciatica since she recently felt pain radiate to her groin. Describes lower extremity weakness L > R. Says she has saddle paresthesias.MRI lumbar spine from 06/2017 revealed no spinal stenosis. normal cauda equina, and normal conus medullaris. L4-L5 and L5-S1 with disc bulging and facet arthropathy. Does not endorse urinary incontinence, fecal incontinence, or paralysis.       Outpatient Medications Prior to Visit  Medication Sig Dispense Refill  . buPROPion (WELLBUTRIN SR) 150 MG 12 hr tablet TAKE 1 TABLET(150 MG) BY MOUTH TWICE DAILY 180 tablet 0  . busPIRone (BUSPAR) 15 MG tablet TAKE 1 TABLET(15 MG) BY MOUTH THREE TIMES DAILY 90 tablet 2  . cetirizine (ZYRTEC) 10 MG tablet Take 1 tablet (10 mg total) by mouth daily. 30 tablet 11  . Cholecalciferol (VITAMIN D PO) Take 50 Units by mouth daily.    . Diclofenac Sodium 3 % GEL PLACE 1 APPLICATION ONTO THE SKIN 3 TIMES DAILY AS NEEDED  1  . fluticasone (FLONASE) 50  MCG/ACT nasal spray Place 2 sprays into both nostrils at bedtime.   1  . gabapentin (NEURONTIN) 300 MG capsule Take 1 capsule (300 mg total) by mouth at bedtime. 90 capsule 1  . Garlic 0254 MG CAPS Take 1,000 mg by mouth daily.    Marland Kitchen glucose blood (TRUE METRIX BLOOD GLUCOSE TEST) test strip Use as instructed 100 each 12  . hydrochlorothiazide (HYDRODIURIL) 12.5 MG tablet Take 1 tablet (12.5 mg total) by mouth daily. 90 tablet 3  . insulin glargine (LANTUS) 100 unit/mL SOPN Inject 1.2 mLs (120 Units total) into the skin at bedtime. 15 pen 11  . Lancets (ONETOUCH ULTRASOFT) lancets Use as instructed 100 each 12  . lisinopril (PRINIVIL,ZESTRIL) 20 MG tablet Take 1 tablet (20 mg total) by mouth daily. 90 tablet 3  . lovastatin (MEVACOR) 20 MG tablet Take 1 tablet (20 mg total) by mouth at bedtime. 90 tablet 3  . QUEtiapine (SEROQUEL) 100 MG tablet TAKE 1 TABLET BY MOUTH AT BEDTIME. 30 tablet 4  . topiramate (TOPAMAX) 50 MG tablet TAKE 1 TABLET BY MOUTH 2 (TWO) TIMES DAILY. 60 tablet 3  . TRUEPLUS INSULIN SYRINGE 30G X 5/16" 0.3 ML MISC USE AS DIRECTED. 100 each 2  . albuterol (PROVENTIL HFA;VENTOLIN HFA) 108 (90 Base) MCG/ACT inhaler Inhale 1-2 puffs into the lungs every 6 (six) hours as needed for wheezing or shortness of breath. (Patient not taking: Reported on 09/12/2017) 1 Inhaler 0  . cyclobenzaprine (FLEXERIL) 10 MG tablet  Take 1 tablet (10 mg total) by mouth 3 (three) times daily as needed for muscle spasms. 90 tablet 0  . cyclobenzaprine (FLEXERIL) 10 MG tablet TAKE 1 TABLET BY MOUTH 3 TIMES DAILY AS NEEDED (NECK PAIN). 90 tablet 0  . ipratropium (ATROVENT) 0.03 % nasal spray Place 2 sprays into both nostrils 2 (two) times daily. (Patient not taking: Reported on 09/12/2017) 30 mL 0  . gabapentin (NEURONTIN) 300 MG capsule TAKE 1 CAPSULE BY MOUTH AT BEDTIME. 60 capsule 1  . guaiFENesin (ROBITUSSIN) 100 MG/5ML SOLN Take 5 mLs (100 mg total) by mouth every 4 (four) hours as needed for cough or to  loosen phlegm. 1200 mL 0  . metoprolol tartrate (LOPRESSOR) 50 MG tablet Take 50 mg by mouth 2 (two) times daily.    . pravastatin (PRAVACHOL) 40 MG tablet Take 40 mg by mouth daily.    . propranolol ER (INDERAL LA) 80 MG 24 hr capsule Take 80 mg by mouth daily.    . Vitamin D, Ergocalciferol, (DRISDOL) 50000 units CAPS capsule Take 1 capsule (50,000 Units total) by mouth every 7 (seven) days. 12 capsule 0   No facility-administered medications prior to visit.      ROS Review of Systems  Constitutional: Negative for chills, fever and malaise/fatigue.  Eyes: Negative for blurred vision.  Respiratory: Negative for shortness of breath.   Cardiovascular: Negative for chest pain and palpitations.  Gastrointestinal: Negative for abdominal pain and nausea.  Genitourinary: Negative for dysuria and hematuria.  Musculoskeletal: Positive for joint pain. Negative for myalgias.  Skin: Negative for rash.  Neurological: Negative for tingling and headaches.  Psychiatric/Behavioral: Negative for depression. The patient is not nervous/anxious.     Objective:  Ht 5\' 6"  (1.676 m)   Wt 235 lb 6.4 oz (106.8 kg)   BMI 37.99 kg/m   Vitals:   09/12/17 0954  BP: (!) 146/85  Pulse: (!) 101  Temp: 98.4 F (36.9 C)  SpO2: 97%      Physical Exam  Constitutional: She is oriented to person, place, and time.  Well developed, obese, NAD, polite  HENT:  Head: Normocephalic and atraumatic.  Eyes: No scleral icterus.  Neck: Normal range of motion. Neck supple. No thyromegaly present.  Cardiovascular: Normal rate, regular rhythm and normal heart sounds.  Pulmonary/Chest: Effort normal and breath sounds normal.  Musculoskeletal: She exhibits no edema.  Mildly antalgic gait favoring left side  Neurological: She is alert and oriented to person, place, and time. No cranial nerve deficit. She exhibits normal muscle tone. Coordination normal.  SLR negative bilaterally. Patellar DTR 1+ bilaterally  Skin:  Skin is warm and dry. No rash noted. No erythema. No pallor.  Psychiatric: She has a normal mood and affect. Her behavior is normal. Thought content normal.  Vitals reviewed.    Assessment & Plan:    1. Type 2 diabetes mellitus without complication, with long-term current use of insulin (HCC) - Glucose (CBG) 362 today - Advised to continue on endocrinology recommendations - Educated pt on red flag symptoms and to go to ED should red flag symptoms appear.   2. Lumbosacral disc herniation - Increase gabapentin (NEURONTIN) 300 MG capsule; Take 1 capsule (300 mg total) by mouth 3 (three) times daily.  Dispense: 90 capsule; Refill: 5 - Advised to make appointment with her orthopedic specialist to continue care.  3. Neuropathy - Increase gabapentin (NEURONTIN) 300 MG capsule; Take 1 capsule (300 mg total) by mouth 3 (three) times daily.  Dispense: 90 capsule;  Refill: 5   Meds ordered this encounter  Medications  . gabapentin (NEURONTIN) 300 MG capsule    Sig: Take 1 capsule (300 mg total) by mouth 3 (three) times daily.    Dispense:  90 capsule    Refill:  5    Order Specific Question:   Supervising Provider    Answer:   Charlott Rakes [[id number]]    Follow-up: Return in about 2 months (around 11/12/2017) for DM2, sciatica.   Clent Demark PA

## 2017-09-20 MED FILL — LISINOPRIL 20 MG TAB: 20 | 30 days supply | Qty: 30 | Fill #9

## 2017-09-20 MED FILL — QUETIAPINE FUMARATE 100 MG: 100 | 30 days supply | Qty: 30 | Fill #3

## 2017-09-20 MED FILL — CETIRIZINE HCL 10 MG TABS: 10 | 30 days supply | Qty: 30 | Fill #8

## 2017-09-20 MED FILL — GABAPENTIN 300 MG CAPSULE: 300 | 30 days supply | Qty: 90 | Fill #0

## 2017-10-05 MED FILL — HYDROCHLOROTHIAZIDE 12.5 MG: 12.5 | 30 days supply | Qty: 30 | Fill #6

## 2017-10-05 MED FILL — LOVASTATIN 20 MG TABLET: 20 | 30 days supply | Qty: 30 | Fill #11

## 2017-10-05 MED FILL — TOPIRAMATE 50 MG TABLET: 50 | 30 days supply | Qty: 60 | Fill #2

## 2017-10-05 MED FILL — LANTUS SOLOSTAR 100 UNITS/M: 100 | 24 days supply | Qty: 30 | Fill #1

## 2017-10-15 ENCOUNTER — Other Ambulatory Visit: Payer: Self-pay | Admitting: Family Medicine

## 2017-10-15 MED FILL — GABAPENTIN 300 MG CAPSULE: 300 | 30 days supply | Qty: 90 | Fill #1

## 2017-10-15 MED FILL — LISINOPRIL 20 MG TAB: 20 | 30 days supply | Qty: 30 | Fill #10

## 2017-10-15 MED FILL — CETIRIZINE HCL 10 MG TABLET: 10 | 30 days supply | Qty: 30 | Fill #9

## 2017-10-17 ENCOUNTER — Other Ambulatory Visit: Payer: Self-pay | Admitting: Sports Medicine

## 2017-10-17 DIAGNOSIS — M25512 Pain in left shoulder: Secondary | ICD-10-CM

## 2017-10-17 DIAGNOSIS — M542 Cervicalgia: Secondary | ICD-10-CM

## 2017-10-17 MED FILL — QUETIAPINE FUMARATE 100 MG: 100 | 30 days supply | Qty: 30 | Fill #4

## 2017-10-18 ENCOUNTER — Other Ambulatory Visit: Payer: Self-pay | Admitting: Family Medicine

## 2017-10-18 MED FILL — CYCLOBENZAPRINE 10 MG TAB: 10 | 30 days supply | Qty: 90 | Fill #0

## 2017-10-24 ENCOUNTER — Ambulatory Visit (INDEPENDENT_AMBULATORY_CARE_PROVIDER_SITE_OTHER): Payer: Medicaid Other | Admitting: Physician Assistant

## 2017-10-24 ENCOUNTER — Other Ambulatory Visit: Payer: Self-pay

## 2017-10-24 ENCOUNTER — Telehealth (INDEPENDENT_AMBULATORY_CARE_PROVIDER_SITE_OTHER): Payer: Self-pay | Admitting: Physician Assistant

## 2017-10-24 ENCOUNTER — Other Ambulatory Visit (INDEPENDENT_AMBULATORY_CARE_PROVIDER_SITE_OTHER): Payer: Self-pay | Admitting: Physician Assistant

## 2017-10-24 ENCOUNTER — Encounter (INDEPENDENT_AMBULATORY_CARE_PROVIDER_SITE_OTHER): Payer: Self-pay | Admitting: Physician Assistant

## 2017-10-24 VITALS — BP 148/82 | HR 116 | Temp 97.9°F | Ht 66.0 in | Wt 241.0 lb

## 2017-10-24 DIAGNOSIS — B3731 Acute candidiasis of vulva and vagina: Secondary | ICD-10-CM

## 2017-10-24 DIAGNOSIS — E119 Type 2 diabetes mellitus without complications: Secondary | ICD-10-CM

## 2017-10-24 DIAGNOSIS — F40240 Claustrophobia: Secondary | ICD-10-CM

## 2017-10-24 DIAGNOSIS — Z23 Encounter for immunization: Secondary | ICD-10-CM

## 2017-10-24 DIAGNOSIS — Z1211 Encounter for screening for malignant neoplasm of colon: Secondary | ICD-10-CM

## 2017-10-24 DIAGNOSIS — B373 Candidiasis of vulva and vagina: Secondary | ICD-10-CM | POA: Diagnosis not present

## 2017-10-24 DIAGNOSIS — Z794 Long term (current) use of insulin: Secondary | ICD-10-CM | POA: Diagnosis not present

## 2017-10-24 DIAGNOSIS — Z1239 Encounter for other screening for malignant neoplasm of breast: Secondary | ICD-10-CM

## 2017-10-24 DIAGNOSIS — Z1231 Encounter for screening mammogram for malignant neoplasm of breast: Secondary | ICD-10-CM

## 2017-10-24 LAB — POCT GLYCOSYLATED HEMOGLOBIN (HGB A1C): Hemoglobin A1C: 13.3 % — AB (ref 4.0–5.6)

## 2017-10-24 MED ORDER — INSULIN ASPART 100 UNIT/ML ~~LOC~~ SOLN: SUBCUTANEOUS | 11 refills | Status: DC

## 2017-10-24 MED ORDER — "INSULIN SYRINGE 31G X 5/16"" 1 ML MISC": 1.0000 | Freq: Three times a day (TID) | 11 refills | Status: DC

## 2017-10-24 MED ORDER — ALPRAZOLAM 2 MG PO TABS
ORAL_TABLET | ORAL | 0 refills | Status: DC
Start: 2017-10-24 — End: 2017-12-28

## 2017-10-24 MED ORDER — FLUCONAZOLE 150 MG PO TABS: 150.0000 mg | ORAL_TABLET | ORAL | 0 refills | Status: DC

## 2017-10-24 MED FILL — FLUCONAZOLE 150 MG TABS: 150 | 28 days supply | Qty: 3 | Fill #0

## 2017-10-24 MED FILL — NovoLOG 100 UNIT/ML SOLN: 100 | 28 days supply | Qty: 10 | Fill #0

## 2017-10-24 MED FILL — TRUEPLUS SYR 0.3ML 31GX5/16: 31G X 5/16" | 30 days supply | Qty: 100 | Fill #0

## 2017-10-24 NOTE — Telephone Encounter (Signed)
Yes, we briefly discussed that during her visit. I have to print the prescription and she has to pick up.

## 2017-10-24 NOTE — Progress Notes (Signed)
Subjective:  Patient ID: Catherine Pope, female    DOB: 24-Jul-1965  Age: 52 y.o. MRN: [id number]  CC: vaginitis  HPI Catherine Pope is a 52 y.o. female with a medical history of DM2, HTN, anxiety, depression, migraines, tobacco use disorder, sinusitis, trochanteric bursitis, lateral epicondylitis, s/p surgical decompression of c-spine, and morbid obesity presents with concern for vaginal yeast infection x3 weeks. Says she has monthly yeast infections since she was young. Used Monistat cream for 3-4 weeks without relief. A1c 13.3% today. Says she does not eat much carbohydrates. Endorses fatigue, polydipsia, polyuria, visual blurring, tingling, and numbness. Saw Endocrinology last month and will be returning 11/14/17, approximately in three weeks.      Outpatient Medications Prior to Visit  Medication Sig Dispense Refill  . busPIRone (BUSPAR) 15 MG tablet TAKE 1 TABLET(15 MG) BY MOUTH THREE TIMES DAILY 90 tablet 2  . cetirizine (ZYRTEC) 10 MG tablet Take 1 tablet (10 mg total) by mouth daily. 30 tablet 11  . Cholecalciferol (VITAMIN D PO) Take 50 Units by mouth daily.    . fluticasone (FLONASE) 50 MCG/ACT nasal spray Place 2 sprays into both nostrils at bedtime.   1  . gabapentin (NEURONTIN) 300 MG capsule Take 1 capsule (300 mg total) by mouth 3 (three) times daily. 90 capsule 5  . Garlic 8756 MG CAPS Take 1,000 mg by mouth daily.    Marland Kitchen glucose blood (TRUE METRIX BLOOD GLUCOSE TEST) test strip Use as instructed 100 each 12  . hydrochlorothiazide (HYDRODIURIL) 12.5 MG tablet Take 1 tablet (12.5 mg total) by mouth daily. 90 tablet 3  . insulin glargine (LANTUS) 100 unit/mL SOPN Inject 1.2 mLs (120 Units total) into the skin at bedtime. 15 pen 11  . Lancets (ONETOUCH ULTRASOFT) lancets Use as instructed 100 each 12  . lisinopril (PRINIVIL,ZESTRIL) 20 MG tablet Take 1 tablet (20 mg total) by mouth daily. 90 tablet 3  . lovastatin (MEVACOR) 20 MG tablet Take 1 tablet (20 mg total) by mouth at  bedtime. 90 tablet 3  . QUEtiapine (SEROQUEL) 100 MG tablet TAKE 1 TABLET BY MOUTH AT BEDTIME. 30 tablet 4  . TRUEPLUS INSULIN SYRINGE 30G X 5/16" 0.3 ML MISC USE AS DIRECTED. 100 each 2  . albuterol (PROVENTIL HFA;VENTOLIN HFA) 108 (90 Base) MCG/ACT inhaler Inhale 1-2 puffs into the lungs every 6 (six) hours as needed for wheezing or shortness of breath. (Patient not taking: Reported on 09/12/2017) 1 Inhaler 0  . buPROPion (WELLBUTRIN SR) 150 MG 12 hr tablet TAKE 1 TABLET(150 MG) BY MOUTH TWICE DAILY 180 tablet 0  . Diclofenac Sodium 3 % GEL PLACE 1 APPLICATION ONTO THE SKIN 3 TIMES DAILY AS NEEDED  1  . topiramate (TOPAMAX) 50 MG tablet TAKE 1 TABLET BY MOUTH 2 (TWO) TIMES DAILY. 60 tablet 3  . cyclobenzaprine (FLEXERIL) 10 MG tablet Take 1 tablet (10 mg total) by mouth 3 (three) times daily as needed for muscle spasms. 90 tablet 0  . cyclobenzaprine (FLEXERIL) 10 MG tablet TAKE 1 TABLET BY MOUTH 3 TIMES DAILY AS NEEDED (NECK PAIN). 90 tablet 0  . ipratropium (ATROVENT) 0.03 % nasal spray Place 2 sprays into both nostrils 2 (two) times daily. (Patient not taking: Reported on 09/12/2017) 30 mL 0   No facility-administered medications prior to visit.      ROS Review of Systems  Constitutional: Negative for chills, fever and malaise/fatigue.  Eyes: Negative for blurred vision.  Respiratory: Negative for shortness of breath.   Cardiovascular: Negative for chest  pain and palpitations.  Gastrointestinal: Negative for abdominal pain and nausea.  Genitourinary: Negative for dysuria and hematuria.       Vaginal burning and itching  Musculoskeletal: Negative for joint pain and myalgias.  Skin: Negative for rash.  Neurological: Negative for tingling and headaches.  Endo/Heme/Allergies: Positive for polydipsia.  Psychiatric/Behavioral: Negative for depression. The patient is not nervous/anxious.     Objective:  BP (!) 148/82 (BP Location: Right Arm, Patient Position: Sitting, Cuff Size: Large)    Pulse (!) 116   Temp 97.9 F (36.6 C) (Oral)   Ht 5\' 6"  (1.676 m)   Wt 241 lb (109.3 kg)   SpO2 96%   BMI 38.90 kg/m   BP/Weight 10/24/2017 0/98/1191 07/08/8293  Systolic BP 621 308 657  Diastolic BP 82 85 84  Wt. (Lbs) 241 235.4 236  BMI 38.9 37.99 38.09      Physical Exam  Constitutional: She is oriented to person, place, and time.  Well developed, obese, NAD, polite  HENT:  Head: Normocephalic and atraumatic.  Eyes: No scleral icterus.  Neck: Normal range of motion. Neck supple. No thyromegaly present.  Cardiovascular: Normal rate, regular rhythm and normal heart sounds.  Pulmonary/Chest: Effort normal and breath sounds normal.  Genitourinary:  Genitourinary Comments: Deferred   Musculoskeletal: She exhibits no edema.  Neurological: She is alert and oriented to person, place, and time.  Skin: Skin is warm and dry. No rash noted. No erythema. No pallor.  Psychiatric: She has a normal mood and affect. Her behavior is normal. Thought content normal.  Vitals reviewed.    Assessment & Plan:    1. Type 2 diabetes mellitus without complication, with long-term current use of insulin (HCC) - HgB A1c 13.3% today - Begin insulin aspart (NOVOLOG) 100 UNIT/ML injection; If sugar 150-200 take 2 units If sugar 201-251 take 4 units If sugar 251-300 take 6 units If sugar 301-350 take 8 units If sugar 351-400 take 10 units  Dispense: 10 mL; Refill: 11 - Begin Insulin Syringe-Needle U-100 (INSULIN SYRINGE 1CC/31GX5/16") 31G X 5/16" 1 ML MISC; Inject 1 each into the skin 3 (three) times daily.  Dispense: 90 each; Refill: 11  2. Vaginal yeast infection - Begin fluconazole (DIFLUCAN) 150 MG tablet; Take 1 tablet (150 mg total) by mouth once a week.  Dispense: 3 tablet; Refill: 0. Pt advised to suspend lovastatin for 72 hours each time she takes fluconazole pill.   3. Need for prophylactic vaccination against Streptococcus pneumoniae (pneumococcus) - Pneumococcal polysaccharide vaccine  23-valent greater than or equal to 2yo subcutaneous/IM  4. Special screening for malignant neoplasms, colon - Ambulatory referral to Gastroenterology  5. Screening for breast cancer - MM DIGITAL SCREENING BILATERAL; Future   Meds ordered this encounter  Medications  . insulin aspart (NOVOLOG) 100 UNIT/ML injection    Sig: If sugar 150-200 take 2 units If sugar 201-251 take 4 units If sugar 251-300 take 6 units If sugar 301-350 take 8 units If sugar 351-400 take 10 units    Dispense:  10 mL    Refill:  11    Order Specific Question:   Supervising Provider    Answer:   Charlott Rakes [[id number]]  . Insulin Syringe-Needle U-100 (INSULIN SYRINGE 1CC/31GX5/16") 31G X 5/16" 1 ML MISC    Sig: Inject 1 each into the skin 3 (three) times daily.    Dispense:  90 each    Refill:  11    Order Specific Question:   Supervising Provider  Answer:   Charlott Rakes [[id number]]  . fluconazole (DIFLUCAN) 150 MG tablet    Sig: Take 1 tablet (150 mg total) by mouth once a week.    Dispense:  3 tablet    Refill:  0    Order Specific Question:   Supervising Provider    Answer:   Charlott Rakes [[id number]]    Follow-up: Return in about 1 month (around 11/21/2017) for pap.   Clent Demark PA

## 2017-10-24 NOTE — Telephone Encounter (Signed)
Patient called wanting to know if PCP could prescribe her the medication for anxiety due to her having a MRI done. Patient states that PCP said he was going to send RX for her to pick up after visit. Patient went to pick medication and did not see anything for anxiety.  Please advice [phone]  Thank Catherine Pope

## 2017-10-24 NOTE — Patient Instructions (Signed)
Diabetes Mellitus and Nutrition When you have diabetes (diabetes mellitus), it is very important to have healthy eating habits because your blood sugar (glucose) levels are greatly affected by what you eat and drink. Eating healthy foods in the appropriate amounts, at about the same times every day, can help you:  Control your blood glucose.  Lower your risk of heart disease.  Improve your blood pressure.  Reach or maintain a healthy weight.  Every person with diabetes is different, and each person has different needs for a meal plan. Your health care provider may recommend that you work with a diet and nutrition specialist (dietitian) to make a meal plan that is best for you. Your meal plan may vary depending on factors such as:  The calories you need.  The medicines you take.  Your weight.  Your blood glucose, blood pressure, and cholesterol levels.  Your activity level.  Other health conditions you have, such as heart or kidney disease.  How do carbohydrates affect me? Carbohydrates affect your blood glucose level more than any other type of food. Eating carbohydrates naturally increases the amount of glucose in your blood. Carbohydrate counting is a method for keeping track of how many carbohydrates you eat. Counting carbohydrates is important to keep your blood glucose at a healthy level, especially if you use insulin or take certain oral diabetes medicines. It is important to know how many carbohydrates you can safely have in each meal. This is different for every person. Your dietitian can help you calculate how many carbohydrates you should have at each meal and for snack. Foods that contain carbohydrates include:  Bread, cereal, rice, pasta, and crackers.  Potatoes and corn.  Peas, beans, and lentils.  Milk and yogurt.  Fruit and juice.  Desserts, such as cakes, cookies, ice cream, and candy.  How does alcohol affect me? Alcohol can cause a sudden decrease in blood  glucose (hypoglycemia), especially if you use insulin or take certain oral diabetes medicines. Hypoglycemia can be a life-threatening condition. Symptoms of hypoglycemia (sleepiness, dizziness, and confusion) are similar to symptoms of having too much alcohol. If your health care provider says that alcohol is safe for you, follow these guidelines:  Limit alcohol intake to no more than 1 drink per day for nonpregnant women and 2 drinks per day for men. One drink equals 12 oz of beer, 5 oz of wine, or 1 oz of hard liquor.  Do not drink on an empty stomach.  Keep yourself hydrated with water, diet soda, or unsweetened iced tea.  Keep in mind that regular soda, juice, and other mixers may contain a lot of sugar and must be counted as carbohydrates.  What are tips for following this plan? Reading food labels  Start by checking the serving size on the label. The amount of calories, carbohydrates, fats, and other nutrients listed on the label are based on one serving of the food. Many foods contain more than one serving per package.  Check the total grams (g) of carbohydrates in one serving. You can calculate the number of servings of carbohydrates in one serving by dividing the total carbohydrates by 15. For example, if a food has 30 g of total carbohydrates, it would be equal to 2 servings of carbohydrates.  Check the number of grams (g) of saturated and trans fats in one serving. Choose foods that have low or no amount of these fats.  Check the number of milligrams (mg) of sodium in one serving. Most people   should limit total sodium intake to less than 2,300 mg per day.  Always check the nutrition information of foods labeled as "low-fat" or "nonfat". These foods may be higher in added sugar or refined carbohydrates and should be avoided.  Talk to your dietitian to identify your daily goals for nutrients listed on the label. Shopping  Avoid buying canned, premade, or processed foods. These  foods tend to be high in fat, sodium, and added sugar.  Shop around the outside edge of the grocery store. This includes fresh fruits and vegetables, bulk grains, fresh meats, and fresh dairy. Cooking  Use low-heat cooking methods, such as baking, instead of high-heat cooking methods like deep frying.  Cook using healthy oils, such as olive, canola, or sunflower oil.  Avoid cooking with butter, cream, or high-fat meats. Meal planning  Eat meals and snacks regularly, preferably at the same times every day. Avoid going long periods of time without eating.  Eat foods high in fiber, such as fresh fruits, vegetables, beans, and whole grains. Talk to your dietitian about how many servings of carbohydrates you can eat at each meal.  Eat 4-6 ounces of lean protein each day, such as lean meat, chicken, fish, eggs, or tofu. 1 ounce is equal to 1 ounce of meat, chicken, or fish, 1 egg, or 1/4 cup of tofu.  Eat some foods each day that contain healthy fats, such as avocado, nuts, seeds, and fish. Lifestyle   Check your blood glucose regularly.  Exercise at least 30 minutes 5 or more days each week, or as told by your health care provider.  Take medicines as told by your health care provider.  Do not use any products that contain nicotine or tobacco, such as cigarettes and e-cigarettes. If you need help quitting, ask your health care provider.  Work with a counselor or diabetes educator to identify strategies to manage stress and any emotional and social challenges. What are some questions to ask my health care provider?  Do I need to meet with a diabetes educator?  Do I need to meet with a dietitian?  What number can I call if I have questions?  When are the best times to check my blood glucose? Where to find more information:  American Diabetes Association: diabetes.org/food-and-fitness/food  Academy of Nutrition and Dietetics:  www.eatright.org/resources/health/diseases-and-conditions/diabetes  National Institute of Diabetes and Digestive and Kidney Diseases (NIH): www.niddk.nih.gov/health-information/diabetes/overview/diet-eating-physical-activity Summary  A healthy meal plan will help you control your blood glucose and maintain a healthy lifestyle.  Working with a diet and nutrition specialist (dietitian) can help you make a meal plan that is best for you.  Keep in mind that carbohydrates and alcohol have immediate effects on your blood glucose levels. It is important to count carbohydrates and to use alcohol carefully. This information is not intended to replace advice given to you by your health care provider. Make sure you discuss any questions you have with your health care provider. Document Released: 12/15/2004 Document Revised: 04/24/2016 Document Reviewed: 04/24/2016 Elsevier Interactive Patient Education  2018 Elsevier Inc.  

## 2017-10-25 NOTE — Telephone Encounter (Signed)
Patient Aware. Thank You

## 2017-10-25 NOTE — Telephone Encounter (Signed)
Okay I will let her know that she can stop by this afternoon if not tomorrow morning. Thank [place]

## 2017-10-27 ENCOUNTER — Ambulatory Visit
Admission: RE | Admit: 2017-10-27 | Discharge: 2017-10-27 | Disposition: A | Discharge status: Home / Self Care | Payer: Medicaid Other | Source: Ambulatory Visit | Attending: Sports Medicine | Admitting: Sports Medicine

## 2017-10-27 DIAGNOSIS — M542 Cervicalgia: Secondary | ICD-10-CM

## 2017-10-27 DIAGNOSIS — M25512 Pain in left shoulder: Secondary | ICD-10-CM

## 2017-10-31 MED FILL — LANTUS SOLOSTAR 100 UNITS/M: 100 | 24 days supply | Qty: 30 | Fill #2

## 2017-11-01 ENCOUNTER — Encounter (INDEPENDENT_AMBULATORY_CARE_PROVIDER_SITE_OTHER): Payer: Self-pay | Admitting: Physician Assistant

## 2017-11-01 ENCOUNTER — Other Ambulatory Visit: Payer: Self-pay

## 2017-11-01 ENCOUNTER — Ambulatory Visit (INDEPENDENT_AMBULATORY_CARE_PROVIDER_SITE_OTHER): Payer: Medicaid Other | Admitting: Physician Assistant

## 2017-11-01 VITALS — BP 139/88 | HR 114 | Temp 98.0°F | Ht 66.0 in | Wt 241.8 lb

## 2017-11-01 DIAGNOSIS — M25531 Pain in right wrist: Secondary | ICD-10-CM

## 2017-11-01 MED ORDER — PREDNISONE 20 MG PO TABS: 60.0000 mg | ORAL_TABLET | Freq: Every day | ORAL | 0 refills | Status: DC

## 2017-11-01 MED ORDER — CEPHALEXIN 500 MG PO TABS: 500.0000 mg | ORAL_TABLET | Freq: Three times a day (TID) | ORAL | 0 refills | Status: AC

## 2017-11-01 MED FILL — predniSONE 20 MG TABS: 20 | 5 days supply | Qty: 15 | Fill #0

## 2017-11-01 MED FILL — CEPHALEXIN 500 MG CAPSULE: 500 | 10 days supply | Qty: 30 | Fill #0

## 2017-11-01 NOTE — Progress Notes (Signed)
Subjective:  Patient ID: Catherine Pope, female    DOB: [birth year]  Age: 52 y.o. MRN: [id number]  CC: stiffness in the hand and wrist  HPI  Marcina Kinnison a 52 y.o.femalewith a medical history of DM2, HTN, anxiety, depression, migraines, tobacco use disorder, sinusitis, trochanteric bursitis, lateral epicondylitis, s/p surgical decompression of c-spine, and morbid obesity presents with right hand and wrist swelling. Onset was sudden since six days ago. Pain woke her from her sleep and is described as a throbbing pain that radiates from the radial aspect of the wrist to the hand and forearm. Exquisitely tender to the touch. Rated 8.5/10 currently. Does not endorse insect bite, f/c/n/v, rash, CP, palpitations, SOB, abdominal pain.       Outpatient Medications Prior to Visit  Medication Sig Dispense Refill  . alprazolam (XANAX) 2 MG tablet Take one tablet one hour before imaging procedure. 1 tablet 0  . buPROPion (WELLBUTRIN SR) 150 MG 12 hr tablet TAKE 1 TABLET(150 MG) BY MOUTH TWICE DAILY 180 tablet 0  . busPIRone (BUSPAR) 15 MG tablet TAKE 1 TABLET(15 MG) BY MOUTH THREE TIMES DAILY 90 tablet 2  . cetirizine (ZYRTEC) 10 MG tablet Take 1 tablet (10 mg total) by mouth daily. 30 tablet 11  . Cholecalciferol (VITAMIN D PO) Take 50 Units by mouth daily.    . Diclofenac Sodium 3 % GEL PLACE 1 APPLICATION ONTO THE SKIN 3 TIMES DAILY AS NEEDED  1  . fluconazole (DIFLUCAN) 150 MG tablet Take 1 tablet (150 mg total) by mouth once a week. 3 tablet 0  . fluticasone (FLONASE) 50 MCG/ACT nasal spray Place 2 sprays into both nostrils at bedtime.   1  . gabapentin (NEURONTIN) 300 MG capsule Take 1 capsule (300 mg total) by mouth 3 (three) times daily. 90 capsule 5  . Garlic 9485 MG CAPS Take 1,000 mg by mouth daily.    Marland Kitchen glucose blood (TRUE METRIX BLOOD GLUCOSE TEST) test strip Use as instructed 100 each 12  . hydrochlorothiazide (HYDRODIURIL) 12.5 MG tablet Take 1 tablet (12.5 mg total) by mouth  daily. 90 tablet 3  . insulin aspart (NOVOLOG) 100 UNIT/ML injection If sugar 150-200 take 2 units If sugar 201-251 take 4 units If sugar 251-300 take 6 units If sugar 301-350 take 8 units If sugar 351-400 take 10 units 10 mL 11  . insulin glargine (LANTUS) 100 unit/mL SOPN Inject 1.2 mLs (120 Units total) into the skin at bedtime. 15 pen 11  . Insulin Syringe-Needle U-100 (INSULIN SYRINGE 1CC/31GX5/16") 31G X 5/16" 1 ML MISC Inject 1 each into the skin 3 (three) times daily. 90 each 11  . Lancets (ONETOUCH ULTRASOFT) lancets Use as instructed 100 each 12  . lisinopril (PRINIVIL,ZESTRIL) 20 MG tablet Take 1 tablet (20 mg total) by mouth daily. 90 tablet 3  . lovastatin (MEVACOR) 20 MG tablet Take 1 tablet (20 mg total) by mouth at bedtime. 90 tablet 3  . QUEtiapine (SEROQUEL) 100 MG tablet TAKE 1 TABLET BY MOUTH AT BEDTIME. 30 tablet 4  . topiramate (TOPAMAX) 50 MG tablet TAKE 1 TABLET BY MOUTH 2 (TWO) TIMES DAILY. 60 tablet 3  . albuterol (PROVENTIL HFA;VENTOLIN HFA) 108 (90 Base) MCG/ACT inhaler Inhale 1-2 puffs into the lungs every 6 (six) hours as needed for wheezing or shortness of breath. (Patient not taking: Reported on 09/12/2017) 1 Inhaler 0   No facility-administered medications prior to visit.      ROS Review of Systems  Constitutional: Negative for chills, fever and  malaise/fatigue.  Eyes: Negative for blurred vision.  Respiratory: Negative for shortness of breath.   Cardiovascular: Negative for chest pain and palpitations.  Gastrointestinal: Negative for abdominal pain and nausea.  Genitourinary: Negative for dysuria and hematuria.  Musculoskeletal: Positive for joint pain. Negative for myalgias.  Skin: Negative for rash.  Neurological: Negative for tingling and headaches.  Psychiatric/Behavioral: Negative for depression. The patient is not nervous/anxious.     Objective:  BP 139/88 (BP Location: Left Arm, Patient Position: Sitting, Cuff Size: Large)   Pulse (!) 114    Temp 98 F (36.7 C) (Oral)   Ht 5\' 6"  (1.676 m)   Wt 241 lb 12.8 oz (109.7 kg)   SpO2 98%   BMI 39.03 kg/m   BP/Weight 11/01/2017 10/24/2017 0/22/3361  Systolic BP 224 497 530  Diastolic BP 88 82 85  Wt. (Lbs) 241.8 241 235.4  BMI 39.03 38.9 37.99      Physical Exam  Constitutional: She is oriented to person, place, and time.  Well developed, well nourished, NAD, polite  HENT:  Head: Normocephalic and atraumatic.  Eyes: No scleral icterus.  Neck: Normal range of motion. Neck supple. No thyromegaly present.  Cardiovascular: Normal rate, regular rhythm and normal heart sounds.  Pulmonary/Chest: Effort normal and breath sounds normal.  Musculoskeletal: She exhibits no edema.  Right wrist with full aROM. Pain elicited with wrist flexion and extension. No pain with ulnar or radial deviation.  Neurological: She is alert and oriented to person, place, and time.  Skin: Skin is warm and dry. No rash noted. No erythema. No pallor.  Radial aspect of right wrist with small patch of erythema and with overlying tiny central papule. Mild generalized edema of the wrist and hypothenar eminence. No suppuration, bleeding, scaling, crusting, necrosis, or ecchymosis.  Psychiatric: She has a normal mood and affect. Her behavior is normal. Thought content normal.  Vitals reviewed.    Assessment & Plan:   1. Right wrist pain - Likely an insect bite over the area of the radial nerve. Needs to decrease mild swelling and have empiric treatment for bacterial infection. Advised to go to the emergency department if no better within 24 hours of taking medication or if there is rapid progression of pain and swelling.  - Begin Prednisone 60 mg qday x5 days - Begin Cephalexin 500 mg TID x10 days   Meds ordered this encounter  Medications  . Cephalexin 500 MG tablet    Sig: Take 1 tablet (500 mg total) by mouth 3 (three) times daily for 10 days.    Dispense:  30 tablet    Refill:  0    Order Specific  Question:   Supervising Provider    Answer:   Charlott Rakes [[id number]]  . predniSONE (DELTASONE) 20 MG tablet    Sig: Take 3 tablets (60 mg total) by mouth daily with breakfast.    Dispense:  15 tablet    Refill:  0    Order Specific Question:   Supervising Provider    Answer:   Charlott Rakes [[id number]]    Follow-up: Return in about 10 days (around 11/11/2017).   Clent Demark PA

## 2017-11-01 NOTE — Patient Instructions (Signed)
Insect Bite, Adult An insect bite can make your skin red, itchy, and swollen. Some insects can spread disease to people with a bite. However, most insect bites do not lead to disease, and most are not serious. Follow these instructions at home: Bite area care  Do not scratch the bite area.  Keep the bite area clean and dry.  Wash the bite area every day with soap and water as told by your doctor.  Check the bite area every day for signs of infection. Check for: ? More redness, swelling, or pain. ? Fluid or blood. ? Warmth. ? Pus. Managing pain, itching, and swelling  You may put any of these on the bite area as told by your doctor: ? A baking soda paste. ? Cortisone cream. ? Calamine lotion.  If directed, put ice on the bite area. ? Put ice in a plastic bag. ? Place a towel between your skin and the bag. ? Leave the ice on for 20 minutes, 2-3 times a day. Medicines  Take medicines or put medicines on your skin only as told by your doctor.  If you were prescribed an antibiotic medicine, use it as told by your doctor. Do not stop using the antibiotic even if your condition improves. General instructions  Keep all follow-up visits as told by your doctor. This is important. How is this prevented? To help you have a lower risk of insect bites:  When you are outside, wear clothing that covers your arms and legs.  Use insect repellent. The best insect repellents have: ? An active ingredient of DEET, picaridin, oil of lemon eucalyptus (OLE), or IR3535. ? Higher amounts of DEET or another active ingredient than other repellents have.  If your home windows do not have screens, think about putting some in.  Contact a doctor if:  You have more redness, swelling, or pain in the bite area.  You have fluid, blood, or pus coming from the bite area.  The bite area feels warm.  You have a fever. Get help right away if:  You have joint pain.  You have a rash.  You have  shortness of breath.  You feel more tired or sleepy than you normally do.  You have neck pain.  You have a headache.  You feel weaker than you normally do.  You have chest pain.  You have pain in your belly.  You feel sick to your stomach (nauseous) or you throw up (vomit). Summary  An insect bite can make your skin red, itchy, and swollen.  Do not scratch the bite area, and keep it clean and dry.  Ice can help with pain and itching from the bite. This information is not intended to replace advice given to you by your health care provider. Make sure you discuss any questions you have with your health care provider. Document Released: 03/17/2000 Document Revised: 10/21/2015 Document Reviewed: 08/05/2014 Elsevier Interactive Patient Education  2018 Elsevier Inc.  

## 2017-11-05 ENCOUNTER — Other Ambulatory Visit: Payer: Self-pay

## 2017-11-05 DIAGNOSIS — I10 Essential (primary) hypertension: Secondary | ICD-10-CM

## 2017-11-05 MED FILL — TOPIRAMATE 50 MG TABLET: 50 | 30 days supply | Qty: 60 | Fill #3

## 2017-11-07 NOTE — Telephone Encounter (Signed)
[name],  Please schedule patient of follow up appointment.     [name],   We can give her enough until her appointment, if requests it.   Thanks.

## 2017-11-07 NOTE — Telephone Encounter (Signed)
Patient states that she is not a patient here anymore and that refills were sent to wrong office.

## 2017-11-08 ENCOUNTER — Other Ambulatory Visit (INDEPENDENT_AMBULATORY_CARE_PROVIDER_SITE_OTHER): Payer: Self-pay | Admitting: Physician Assistant

## 2017-11-08 ENCOUNTER — Telehealth (INDEPENDENT_AMBULATORY_CARE_PROVIDER_SITE_OTHER): Payer: Self-pay | Admitting: Physician Assistant

## 2017-11-08 DIAGNOSIS — I10 Essential (primary) hypertension: Secondary | ICD-10-CM

## 2017-11-08 MED ORDER — HYDROCHLOROTHIAZIDE 12.5 MG PO TABS: 12.5000 mg | ORAL_TABLET | Freq: Every day | ORAL | 5 refills | Status: DC

## 2017-11-08 MED ORDER — LISINOPRIL 20 MG PO TABS: 20.0000 mg | ORAL_TABLET | Freq: Every day | ORAL | 5 refills | Status: DC

## 2017-11-08 MED FILL — HYDROCHLOROTHIAZIDE 12.5 MG: 12.5 | 30 days supply | Qty: 30 | Fill #0

## 2017-11-08 NOTE — Telephone Encounter (Signed)
Patient called requesting medication refill for   lisinopril (PRINIVIL,ZESTRIL) 20 MG tablet  hydrochlorothiazide (HYDRODIURIL) 12.5 MG tablet  Patient uses   [facility] [address]  Please Advice [phone]  Thank You Catherine Pope

## 2017-11-08 NOTE — Telephone Encounter (Signed)
Already addressed

## 2017-11-09 ENCOUNTER — Other Ambulatory Visit: Payer: Self-pay | Admitting: Internal Medicine

## 2017-11-09 NOTE — Telephone Encounter (Signed)
Noted thank you

## 2017-11-12 MED FILL — GABAPENTIN 300 MG CAPSULE: 300 | 30 days supply | Qty: 90 | Fill #2

## 2017-11-12 MED FILL — CETIRIZINE HCL 10 MG TABLET: 10 | 30 days supply | Qty: 30 | Fill #10

## 2017-11-12 MED FILL — LISINOPRIL 20 MG TAB: 20 | 30 days supply | Qty: 30 | Fill #0

## 2017-11-12 MED FILL — LOVASTATIN 20 MG TABLET: 20 | 90 days supply | Qty: 90 | Fill #0

## 2017-11-13 ENCOUNTER — Other Ambulatory Visit: Payer: Self-pay

## 2017-11-13 DIAGNOSIS — F329 Major depressive disorder, single episode, unspecified: Secondary | ICD-10-CM

## 2017-11-13 DIAGNOSIS — F32A Depression, unspecified: Secondary | ICD-10-CM

## 2017-11-13 DIAGNOSIS — F419 Anxiety disorder, unspecified: Secondary | ICD-10-CM

## 2017-11-13 DIAGNOSIS — E785 Hyperlipidemia, unspecified: Secondary | ICD-10-CM

## 2017-11-13 MED ORDER — LOVASTATIN 20 MG PO TABS: 20.0000 mg | ORAL_TABLET | Freq: Every day | ORAL | 3 refills | Status: DC

## 2017-11-14 ENCOUNTER — Ambulatory Visit (INDEPENDENT_AMBULATORY_CARE_PROVIDER_SITE_OTHER): Payer: Medicaid Other | Admitting: Endocrinology

## 2017-11-14 ENCOUNTER — Encounter: Payer: Self-pay | Admitting: Endocrinology

## 2017-11-14 VITALS — BP 164/88 | HR 107 | Ht 66.0 in | Wt 246.6 lb

## 2017-11-14 DIAGNOSIS — E118 Type 2 diabetes mellitus with unspecified complications: Secondary | ICD-10-CM | POA: Diagnosis not present

## 2017-11-14 DIAGNOSIS — Z794 Long term (current) use of insulin: Secondary | ICD-10-CM | POA: Diagnosis not present

## 2017-11-14 NOTE — Patient Instructions (Addendum)
Your blood pressure is high today.  Please see your primary care provider soon, to have it rechecked.   A different type of blood test isrequested for you today.  We'll let you know about the results. check your blood sugar twice a day.  vary the time of day when you check, between before the 3 meals, and at bedtime.  also check if you have symptoms of your blood sugar being too high or too low.  please keep a record of the readings and bring it to your next appointment here (or you can bring the meter itself).  You can write it on any piece of paper.  please call us sooner if your blood sugar goes below 70, or if you have a lot of readings over 200.    Please come back for a follow-up appointment in 3 months.

## 2017-11-14 NOTE — Progress Notes (Signed)
Subjective:    Patient ID: Catherine Pope, female    DOB: Aug 26, 1965, 52 y.o.   MRN: [id number]  HPI Pt returns for f/u of diabetes mellitus: DM type: Insulin-requiring type 2 Dx'ed: 6144 Complications: none Therapy: insulin since 2018 GDM: never DKA: never Severe hypoglycemia: never Pancreatitis: never Pancreatic imaging: normal on 2005 CT.   Other: she takes qd insulin, after poor results with multiple daily injections; She gets meds at [facility] Interval history: no cbg record, but states cbg's vary from 109-200's.  It is in general higher as the day goes on.  Pt says she never misses the insulin.   Past Medical History:  Diagnosis Date  . Anxiety   . Depression   . Diabetes mellitus    dx back in 2010  . Hypertension   . Migraine   . Sinusitis     Past Surgical History:  Procedure Laterality Date  . ABDOMINAL HYSTERECTOMY    . ANTERIOR CERVICAL DECOMP/DISCECTOMY FUSION N/A 12/27/2016   Procedure: CERVICAL SIX-SEVEN ANTERIOR CERVICAL DECOMPRESSION/DISCECTOMY FUSION;  Surgeon: Eustace Moore, MD;  Location: [place];  Service: Neurosurgery;  Laterality: N/A;  . ANTERIOR CRUCIATE LIGAMENT REPAIR    . BREAST SURGERY     2 lumps removed at age 72, left breast  . SHOULDER ARTHROSCOPY     right shoulder    Social History   Socioeconomic History  . Marital status: Single    Spouse name: Not on file  . Number of children: 2  . Years of education: 62 th  . Highest education level: Not on file  Occupational History  . Occupation: unemployed  Social Needs  . Financial resource strain: Not on file  . Food insecurity:    Worry: Sometimes true    Inability: Sometimes true  . Transportation needs:    Medical: Not on file    Non-medical: Not on file  Tobacco Use  . Smoking status: Current Every Day Smoker    Packs/day: 0.50    Years: 15.00    Pack years: 7.50    Types: Cigarettes  . Smokeless tobacco: Never Used  Substance and Sexual Activity  . Alcohol use: No  . Drug  use: No  . Sexual activity: Not on file  Lifestyle  . Physical activity:    Days per week: Not on file    Minutes per session: Not on file  . Stress: Not on file  Relationships  . Social connections:    Talks on phone: Not on file    Gets together: Not on file    Attends religious service: Not on file    Active member of club or organization: Not on file    Attends meetings of clubs or organizations: Not on file    Relationship status: Not on file  . Intimate partner violence:    Fear of current or ex partner: Not on file    Emotionally abused: Not on file    Physically abused: Not on file    Forced sexual activity: Not on file  Other Topics Concern  . Not on file  Social History Narrative   Patient lives at home with her daughters and she is unemployed. Patient has two children.   Patient has a high school education.   Right handed.    Caffeine two cups daily.    Current Outpatient Medications on File Prior to Visit  Medication Sig Dispense Refill  . buPROPion (WELLBUTRIN SR) 150 MG 12 hr tablet TAKE 1 TABLET(150 MG)  BY MOUTH TWICE DAILY 180 tablet 0  . busPIRone (BUSPAR) 15 MG tablet TAKE 1 TABLET(15 MG) BY MOUTH THREE TIMES DAILY 90 tablet 2  . cetirizine (ZYRTEC) 10 MG tablet Take 1 tablet (10 mg total) by mouth daily. 30 tablet 11  . Cholecalciferol (VITAMIN D PO) Take 50 Units by mouth daily.    . Diclofenac Sodium 3 % GEL PLACE 1 APPLICATION ONTO THE SKIN 3 TIMES DAILY AS NEEDED  1  . fluticasone (FLONASE) 50 MCG/ACT nasal spray Place 2 sprays into both nostrils at bedtime.   1  . gabapentin (NEURONTIN) 300 MG capsule Take 1 capsule (300 mg total) by mouth 3 (three) times daily. 90 capsule 5  . Garlic 1275 MG CAPS Take 1,000 mg by mouth daily.    Marland Kitchen glucose blood (TRUE METRIX BLOOD GLUCOSE TEST) test strip Use as instructed 100 each 12  . hydrochlorothiazide (HYDRODIURIL) 12.5 MG tablet Take 1 tablet (12.5 mg total) by mouth daily. 30 tablet 5  . insulin glargine  (LANTUS) 100 unit/mL SOPN Inject 1.2 mLs (120 Units total) into the skin at bedtime. 15 pen 11  . Lancets (ONETOUCH ULTRASOFT) lancets Use as instructed 100 each 12  . lisinopril (PRINIVIL,ZESTRIL) 20 MG tablet Take 1 tablet (20 mg total) by mouth daily. 30 tablet 5  . lovastatin (MEVACOR) 20 MG tablet Take 1 tablet (20 mg total) by mouth at bedtime. 90 tablet 3  . QUEtiapine (SEROQUEL) 100 MG tablet TAKE 1 TABLET BY MOUTH AT BEDTIME. 30 tablet 4  . topiramate (TOPAMAX) 50 MG tablet TAKE 1 TABLET BY MOUTH 2 (TWO) TIMES DAILY. 60 tablet 3  . albuterol (PROVENTIL HFA;VENTOLIN HFA) 108 (90 Base) MCG/ACT inhaler Inhale 1-2 puffs into the lungs every 6 (six) hours as needed for wheezing or shortness of breath. (Patient not taking: Reported on 09/12/2017) 1 Inhaler 0  . alprazolam (XANAX) 2 MG tablet Take one tablet one hour before imaging procedure. (Patient not taking: Reported on 11/14/2017) 1 tablet 0  . fluconazole (DIFLUCAN) 150 MG tablet Take 1 tablet (150 mg total) by mouth once a week. (Patient not taking: Reported on 11/14/2017) 3 tablet 0  . predniSONE (DELTASONE) 20 MG tablet Take 3 tablets (60 mg total) by mouth daily with breakfast. (Patient not taking: Reported on 11/14/2017) 15 tablet 0   No current facility-administered medications on file prior to visit.     No Known Allergies  Family History  Problem Relation Age of Onset  . Heart Problems Father   . Diabetes Father   . Hypertension Other   . Heart disease Other     BP (!) 164/88 (BP Location: Right Arm, Patient Position: Sitting, Cuff Size: Normal)   Pulse (!) 107   Ht 5\' 6"  (1.676 m)   Wt 246 lb 9.6 oz (111.9 kg)   SpO2 99%   BMI 39.80 kg/m   Review of Systems She denies hypoglycemia.      Objective:   Physical Exam VITAL SIGNS:  See vs page GENERAL: no distress Pulses: foot pulses are intact bilaterally.   MSK: no deformity of the feet or ankles.  CV: no edema of the legs or ankles Skin:  no ulcer on the feet or  ankles.  normal color and temp on the feet and ankles Neuro: sensation is intact to touch on the feet and ankles.     Lab Results  Component Value Date   HGBA1C 13.3 (A) 10/24/2017       Assessment & Plan:  HTN: is noted today Insulin-requiring type 2 DM: she needs increased rx   Patient Instructions  Your blood pressure is high today.  Please see your primary care provider soon, to have it rechecked.   A different type of blood test isrequested for you today.  We'll let you know about the results. check your blood sugar twice a day.  vary the time of day when you check, between before the 3 meals, and at bedtime.  also check if you have symptoms of your blood sugar being too high or too low.  please keep a record of the readings and bring it to your next appointment here (or you can bring the meter itself).  You can write it on any piece of paper.  please call us sooner if your blood sugar goes below 70, or if you have a lot of readings over 200.    Please come back for a follow-up appointment in 3 months.

## 2017-11-16 LAB — FRUCTOSAMINE: Fructosamine: 372 umol/L — ABNORMAL HIGH (ref 190–270)

## 2017-11-19 ENCOUNTER — Other Ambulatory Visit: Payer: Self-pay

## 2017-11-19 MED ORDER — INSULIN GLARGINE 100 UNITS/ML SOLOSTAR PEN: 130.0000 [IU] | PEN_INJECTOR | Freq: Every day | SUBCUTANEOUS | 11 refills | Status: DC

## 2017-11-20 MED FILL — GABAPENTIN 300 MG CAPSULE: 300 | 30 days supply | Qty: 90 | Fill #3

## 2017-11-21 ENCOUNTER — Other Ambulatory Visit: Payer: Self-pay

## 2017-11-21 ENCOUNTER — Encounter (INDEPENDENT_AMBULATORY_CARE_PROVIDER_SITE_OTHER): Payer: Self-pay | Admitting: Physician Assistant

## 2017-11-21 ENCOUNTER — Ambulatory Visit (INDEPENDENT_AMBULATORY_CARE_PROVIDER_SITE_OTHER): Payer: Medicaid Other | Admitting: Physician Assistant

## 2017-11-21 ENCOUNTER — Other Ambulatory Visit (HOSPITAL_COMMUNITY)
Admission: RE | Admit: 2017-11-21 | Discharge: 2017-11-21 | Disposition: A | Discharge status: Home / Self Care | Payer: Medicaid Other | Source: Ambulatory Visit | Attending: Physician Assistant | Admitting: Physician Assistant

## 2017-11-21 VITALS — BP 162/93 | HR 105 | Temp 97.9°F | Ht 66.0 in | Wt 249.2 lb

## 2017-11-21 DIAGNOSIS — N870 Mild cervical dysplasia: Secondary | ICD-10-CM | POA: Insufficient documentation

## 2017-11-21 DIAGNOSIS — Z124 Encounter for screening for malignant neoplasm of cervix: Secondary | ICD-10-CM | POA: Diagnosis not present

## 2017-11-21 DIAGNOSIS — F418 Other specified anxiety disorders: Secondary | ICD-10-CM | POA: Diagnosis not present

## 2017-11-21 DIAGNOSIS — Z23 Encounter for immunization: Secondary | ICD-10-CM

## 2017-11-21 MED ORDER — CLONAZEPAM 1 MG PO TABS: 1.0000 mg | ORAL_TABLET | Freq: Every evening | ORAL | 0 refills | Status: DC | PRN

## 2017-11-21 NOTE — Progress Notes (Signed)
Subjective:  Patient ID: Catherine Pope, female    DOB: 12-Jul-1965  Age: 52 y.o. MRN: [id number]  CC: pap  HPI Catherine Pope a 52 y.o.femalewith a medical history of DM2, HTN, anxiety, depression, migraines, tobacco use disorder, sinusitis, trochanteric bursitis, lateral epicondylitis, s/p surgical decompression of c-spine, and morbid obesity presents for PAP smear. Denies pelvic pain, vaginal discharge, AUB, or dyspareunia.    Says her anxiety has been worsening. Did not go to [facility] due to time and transportation issues. Has never seen a psychiatrist.   Outpatient Medications Prior to Visit  Medication Sig Dispense Refill  . buPROPion (WELLBUTRIN SR) 150 MG 12 hr tablet TAKE 1 TABLET(150 MG) BY MOUTH TWICE DAILY 180 tablet 0  . busPIRone (BUSPAR) 15 MG tablet TAKE 1 TABLET(15 MG) BY MOUTH THREE TIMES DAILY 90 tablet 2  . cetirizine (ZYRTEC) 10 MG tablet Take 1 tablet (10 mg total) by mouth daily. 30 tablet 11  . Cholecalciferol (VITAMIN D PO) Take 50 Units by mouth daily.    . Diclofenac Sodium 3 % GEL PLACE 1 APPLICATION ONTO THE SKIN 3 TIMES DAILY AS NEEDED  1  . fluticasone (FLONASE) 50 MCG/ACT nasal spray Place 2 sprays into both nostrils at bedtime.   1  . gabapentin (NEURONTIN) 300 MG capsule Take 1 capsule (300 mg total) by mouth 3 (three) times daily. 90 capsule 5  . Garlic 6063 MG CAPS Take 1,000 mg by mouth daily.    Marland Kitchen glucose blood (TRUE METRIX BLOOD GLUCOSE TEST) test strip Use as instructed 100 each 12  . hydrochlorothiazide (HYDRODIURIL) 12.5 MG tablet Take 1 tablet (12.5 mg total) by mouth daily. 30 tablet 5  . insulin glargine (LANTUS) 100 unit/mL SOPN Inject 1.3 mLs (130 Units total) into the skin at bedtime. 15 pen 11  . Lancets (ONETOUCH ULTRASOFT) lancets Use as instructed 100 each 12  . lisinopril (PRINIVIL,ZESTRIL) 20 MG tablet Take 1 tablet (20 mg total) by mouth daily. 30 tablet 5  . lovastatin (MEVACOR) 20 MG tablet Take 1 tablet (20 mg total) by mouth at  bedtime. 90 tablet 3  . QUEtiapine (SEROQUEL) 100 MG tablet TAKE 1 TABLET BY MOUTH AT BEDTIME. 30 tablet 4  . topiramate (TOPAMAX) 50 MG tablet TAKE 1 TABLET BY MOUTH 2 (TWO) TIMES DAILY. 60 tablet 3  . albuterol (PROVENTIL HFA;VENTOLIN HFA) 108 (90 Base) MCG/ACT inhaler Inhale 1-2 puffs into the lungs every 6 (six) hours as needed for wheezing or shortness of breath. (Patient not taking: Reported on 09/12/2017) 1 Inhaler 0  . alprazolam (XANAX) 2 MG tablet Take one tablet one hour before imaging procedure. (Patient not taking: Reported on 11/14/2017) 1 tablet 0  . fluconazole (DIFLUCAN) 150 MG tablet Take 1 tablet (150 mg total) by mouth once a week. (Patient not taking: Reported on 11/14/2017) 3 tablet 0  . predniSONE (DELTASONE) 20 MG tablet Take 3 tablets (60 mg total) by mouth daily with breakfast. (Patient not taking: Reported on 11/14/2017) 15 tablet 0   No facility-administered medications prior to visit.      ROS Review of Systems  Constitutional: Negative for chills, fever and malaise/fatigue.  Eyes: Negative for blurred vision.  Respiratory: Negative for shortness of breath.   Cardiovascular: Negative for chest pain and palpitations.  Gastrointestinal: Negative for abdominal pain and nausea.  Genitourinary: Negative for dysuria and hematuria.  Musculoskeletal: Negative for joint pain and myalgias.  Skin: Negative for rash.  Neurological: Negative for tingling and headaches.  Psychiatric/Behavioral: Negative for depression.  The patient is not nervous/anxious.     Objective:  BP (!) 162/93 (BP Location: Left Arm, Patient Position: Sitting, Cuff Size: Large)   Pulse (!) 105   Temp 97.9 F (36.6 C) (Oral)   Ht 5\' 6"  (1.676 m)   Wt 249 lb 3.2 oz (113 kg)   SpO2 99%   BMI 40.22 kg/m   BP/Weight 11/21/2017 6/73/4193 10/10/238  Systolic BP 973 532 992  Diastolic BP 93 88 88  Wt. (Lbs) 249.2 246.6 241.8  BMI 40.22 39.8 39.03      Physical Exam  Constitutional: She is  oriented to person, place, and time.  Well developed, morbid obesity, NAD, polite  HENT:  Head: Normocephalic and atraumatic.  Eyes: No scleral icterus.  Neck: Normal range of motion. Neck supple. No thyromegaly present.  Cardiovascular: Normal rate, regular rhythm and normal heart sounds.  Pulmonary/Chest: Effort normal and breath sounds normal.  Abdominal: Soft. Bowel sounds are normal. There is no tenderness.  Genitourinary:  Genitourinary Comments: Vaginal with mild thin whitish discharge. Cervical os no apparent. No adnexal tenderness or mass bilaterally.   Musculoskeletal: She exhibits no edema.  Neurological: She is alert and oriented to person, place, and time.  Skin: Skin is warm and dry. No rash noted. No erythema. No pallor.  Psychiatric: She has a normal mood and affect. Her behavior is normal. Thought content normal.  Vitals reviewed.    Assessment & Plan:    1. Need for prophylactic vaccination and inoculation against influenza - Flu Vaccine QUAD 6+ mos PF IM (Fluarix Quad PF)  2. Screening for cervical cancer - Cytology - PAP([facility])  3. Depression with anxiety - Ambulatory referral to Psychiatry - Begin clonazePAM (KLONOPIN) 1 MG tablet; Take 1 tablet (1 mg total) by mouth at bedtime as needed for anxiety.  Dispense: 30 tablet; Refill: 0   Meds ordered this encounter  Medications  . clonazePAM (KLONOPIN) 1 MG tablet    Sig: Take 1 tablet (1 mg total) by mouth at bedtime as needed for anxiety.    Dispense:  30 tablet    Refill:  0    Order Specific Question:   Supervising Provider    Answer:   Charlott Rakes [[id number]]    Follow-up: No follow-ups on file.   Clent Demark PA

## 2017-11-21 NOTE — Patient Instructions (Signed)

## 2017-11-23 ENCOUNTER — Other Ambulatory Visit (INDEPENDENT_AMBULATORY_CARE_PROVIDER_SITE_OTHER): Payer: Self-pay | Admitting: Physician Assistant

## 2017-11-23 ENCOUNTER — Telehealth (INDEPENDENT_AMBULATORY_CARE_PROVIDER_SITE_OTHER): Payer: Self-pay | Admitting: Physician Assistant

## 2017-11-23 ENCOUNTER — Other Ambulatory Visit (HOSPITAL_COMMUNITY): Payer: Self-pay | Admitting: Sports Medicine

## 2017-11-23 ENCOUNTER — Telehealth (INDEPENDENT_AMBULATORY_CARE_PROVIDER_SITE_OTHER): Payer: Self-pay

## 2017-11-23 DIAGNOSIS — R87612 Low grade squamous intraepithelial lesion on cytologic smear of cervix (LGSIL): Secondary | ICD-10-CM

## 2017-11-23 DIAGNOSIS — F32A Depression, unspecified: Secondary | ICD-10-CM

## 2017-11-23 DIAGNOSIS — M25512 Pain in left shoulder: Secondary | ICD-10-CM

## 2017-11-23 DIAGNOSIS — F419 Anxiety disorder, unspecified: Secondary | ICD-10-CM

## 2017-11-23 DIAGNOSIS — Z760 Encounter for issue of repeat prescription: Secondary | ICD-10-CM

## 2017-11-23 DIAGNOSIS — F329 Major depressive disorder, single episode, unspecified: Secondary | ICD-10-CM

## 2017-11-23 DIAGNOSIS — M542 Cervicalgia: Secondary | ICD-10-CM

## 2017-11-23 LAB — CYTOLOGY - PAP
Bacterial vaginitis: NEGATIVE
Candida vaginitis: NEGATIVE
Chlamydia: NEGATIVE
Neisseria Gonorrhea: NEGATIVE
Trichomonas: NEGATIVE

## 2017-11-23 MED ORDER — QUETIAPINE FUMARATE 100 MG PO TABS: 100.0000 mg | ORAL_TABLET | Freq: Every day | ORAL | 4 refills | Status: DC

## 2017-11-23 MED ORDER — CYCLOBENZAPRINE HCL 10 MG PO TABS: 10.0000 mg | ORAL_TABLET | Freq: Three times a day (TID) | ORAL | 0 refills | Status: DC | PRN

## 2017-11-23 MED FILL — LANTUS SOLOSTAR 100 UNITS/M: 100 | 24 days supply | Qty: 30 | Fill #3

## 2017-11-23 NOTE — Telephone Encounter (Signed)
Patient aware. Catherine Pope S Roarke Marciano, CMA  

## 2017-11-23 NOTE — Telephone Encounter (Signed)
Seroquel sent to [facility].

## 2017-11-23 NOTE — Telephone Encounter (Signed)
Pt needs to get her psychiatry meds from her psychiatrist. Short course of Flexeril will be sent out.

## 2017-11-23 NOTE — Telephone Encounter (Signed)
-----   Message from Clent Demark, PA-C sent at 11/23/2017  8:37 AM EDT ----- Abnormal Pap. I have made referral to GYN for further evaluation.

## 2017-11-23 NOTE — Telephone Encounter (Signed)
Patient aware that flexeril has been refilled but only a short course. Patient does not have psychiatrist was just referred on 8/21. The last provider that prescribed seroquel was at [facility] when she was being seen there. She no longer sees that provider. Nat Christen, CMA

## 2017-11-23 NOTE — Telephone Encounter (Signed)
Patient is requesting medication refill for   QUEtiapine (SEROQUEL) 100 MG tablet   And for   cyclobenzaprine (FLEXERIL) 10 MG tablet   Patient uses [place], [place] [address]   Please Advice [phone]  Thank you Emmit Pomfret

## 2017-11-23 NOTE — Telephone Encounter (Signed)
Patient is aware that pap was abnormal and she has been referred to GYN at [facility]. She knows that someone from [facility] will call to schedule her appointment. Nat Christen, CMA

## 2017-11-26 MED FILL — QUETIAPINE FUMARATE 100 MG: 100 | 30 days supply | Qty: 30 | Fill #0

## 2017-11-26 MED FILL — CYCLOBENZAPRINE 10 MG TAB: 10 | 10 days supply | Qty: 30 | Fill #0

## 2017-11-26 MED FILL — NovoLOG 100 UNIT/ML SOLN: 100 | 28 days supply | Qty: 10 | Fill #1

## 2017-12-02 ENCOUNTER — Other Ambulatory Visit: Payer: Self-pay | Admitting: Family Medicine

## 2017-12-07 ENCOUNTER — Telehealth (INDEPENDENT_AMBULATORY_CARE_PROVIDER_SITE_OTHER): Payer: Self-pay | Admitting: Physician Assistant

## 2017-12-07 ENCOUNTER — Encounter: Payer: Self-pay | Admitting: Gastroenterology

## 2017-12-07 ENCOUNTER — Other Ambulatory Visit (INDEPENDENT_AMBULATORY_CARE_PROVIDER_SITE_OTHER): Payer: Self-pay | Admitting: Physician Assistant

## 2017-12-07 ENCOUNTER — Other Ambulatory Visit: Payer: Self-pay | Admitting: Family Medicine

## 2017-12-07 ENCOUNTER — Other Ambulatory Visit: Payer: Self-pay | Admitting: Physician Assistant

## 2017-12-07 DIAGNOSIS — F418 Other specified anxiety disorders: Secondary | ICD-10-CM

## 2017-12-07 MED ORDER — CLONAZEPAM 0.5 MG PO TABS
0.5000 mg | ORAL_TABLET | Freq: Two times a day (BID) | ORAL | 0 refills | Status: DC | PRN
Start: 2017-12-07 — End: 2018-01-28

## 2017-12-07 NOTE — Telephone Encounter (Signed)
FWD to PCP. Catherine Pope, CMA  

## 2017-12-07 NOTE — Telephone Encounter (Signed)
Patient Is aware of refill and decreased dosage. She will contact medicaid to inquire on what psychiatry offices accept medicaid and she will RFM to inform staff. Nat Christen, CMA

## 2017-12-07 NOTE — Telephone Encounter (Signed)
[facility] is backed up cant see patient until December. Eustaquio Maize attempted to send referral to [facility] but they do not accept medicaid. Patient needs refill of anxiety medication. Nat Christen, CMA

## 2017-12-07 NOTE — Telephone Encounter (Signed)
I have reduced dose to 0.5 mg of clonazepam. Needs to establish psychiatric care. She can look up psychiatry office's and call to see if they accept her insurance. She can also try calling her insurance to obtain a list of accepting psychiatrists.

## 2017-12-07 NOTE — Telephone Encounter (Signed)
Patient called in regards of her Depression and anxiety referral. Patient states that PCP only gave her 1 month supply of medication due to him wanting to f/u with a specialist but has not heard anything. Front desk tired to look for the referral but could not find anything.  Please Advice [phone]  Thank you Emmit Pomfret

## 2017-12-09 ENCOUNTER — Other Ambulatory Visit: Payer: Self-pay | Admitting: Family Medicine

## 2017-12-10 ENCOUNTER — Other Ambulatory Visit (INDEPENDENT_AMBULATORY_CARE_PROVIDER_SITE_OTHER): Payer: Self-pay | Admitting: Physician Assistant

## 2017-12-10 ENCOUNTER — Encounter (HOSPITAL_COMMUNITY): Payer: Self-pay | Admitting: *Deleted

## 2017-12-10 ENCOUNTER — Other Ambulatory Visit: Payer: Self-pay | Admitting: Family Medicine

## 2017-12-10 ENCOUNTER — Other Ambulatory Visit: Payer: Self-pay

## 2017-12-10 DIAGNOSIS — Z760 Encounter for issue of repeat prescription: Secondary | ICD-10-CM

## 2017-12-10 NOTE — Telephone Encounter (Signed)
FWD to PCP. Ryhanna Dunsmore S Vanesa Renier, CMA  

## 2017-12-11 ENCOUNTER — Encounter (HOSPITAL_COMMUNITY): Payer: Self-pay

## 2017-12-11 ENCOUNTER — Ambulatory Visit (HOSPITAL_COMMUNITY)
Admission: RE | Admit: 2017-12-11 | Discharge: 2017-12-11 | Disposition: A | Discharge status: Home / Self Care | Payer: Medicaid Other | Source: Ambulatory Visit | Attending: Sports Medicine | Admitting: Sports Medicine

## 2017-12-11 ENCOUNTER — Other Ambulatory Visit: Payer: Self-pay

## 2017-12-11 ENCOUNTER — Encounter (HOSPITAL_COMMUNITY)
Admission: RE | Disposition: A | Discharge status: Home / Self Care | Payer: Self-pay | Source: Ambulatory Visit | Attending: Sports Medicine

## 2017-12-11 ENCOUNTER — Ambulatory Visit (HOSPITAL_COMMUNITY): Payer: Medicaid Other | Admitting: Certified Registered Nurse Anesthetist

## 2017-12-11 DIAGNOSIS — M7552 Bursitis of left shoulder: Secondary | ICD-10-CM | POA: Insufficient documentation

## 2017-12-11 DIAGNOSIS — M50222 Other cervical disc displacement at C5-C6 level: Secondary | ICD-10-CM | POA: Insufficient documentation

## 2017-12-11 DIAGNOSIS — X58XXXA Exposure to other specified factors, initial encounter: Secondary | ICD-10-CM | POA: Insufficient documentation

## 2017-12-11 DIAGNOSIS — M542 Cervicalgia: Secondary | ICD-10-CM | POA: Diagnosis not present

## 2017-12-11 DIAGNOSIS — M25512 Pain in left shoulder: Secondary | ICD-10-CM

## 2017-12-11 DIAGNOSIS — M7582 Other shoulder lesions, left shoulder: Secondary | ICD-10-CM | POA: Insufficient documentation

## 2017-12-11 DIAGNOSIS — Z981 Arthrodesis status: Secondary | ICD-10-CM | POA: Insufficient documentation

## 2017-12-11 DIAGNOSIS — S46812A Strain of other muscles, fascia and tendons at shoulder and upper arm level, left arm, initial encounter: Secondary | ICD-10-CM | POA: Diagnosis not present

## 2017-12-11 HISTORY — PX: RADIOLOGY WITH ANESTHESIA: SHX6223

## 2017-12-11 LAB — CBC
HCT: 40.3 % (ref 36.0–46.0)
Hemoglobin: 13.2 g/dL (ref 12.0–15.0)
MCH: 29.4 pg (ref 26.0–34.0)
MCHC: 32.8 g/dL (ref 30.0–36.0)
MCV: 89.8 fL (ref 78.0–100.0)
Platelets: 265 10*3/uL (ref 150–400)
RBC: 4.49 MIL/uL (ref 3.87–5.11)
RDW: 13.5 % (ref 11.5–15.5)
WBC: 12.7 10*3/uL — ABNORMAL HIGH (ref 4.0–10.5)

## 2017-12-11 LAB — BASIC METABOLIC PANEL
Anion gap: 12 (ref 5–15)
BUN: 7 mg/dL (ref 6–20)
CO2: 21 mmol/L — ABNORMAL LOW (ref 22–32)
Calcium: 9.4 mg/dL (ref 8.9–10.3)
Chloride: 103 mmol/L (ref 98–111)
Creatinine, Ser: 0.71 mg/dL (ref 0.44–1.00)
GFR calc Af Amer: >60 mL/min (ref >60)
GFR calc non Af Amer: >60 mL/min (ref >60)
Glucose, Bld: 240 mg/dL — ABNORMAL HIGH (ref 70–99)
Potassium: 3.4 mmol/L — ABNORMAL LOW (ref 3.5–5.1)
Sodium: 136 mmol/L (ref 135–145)

## 2017-12-11 LAB — GLUCOSE, CAPILLARY
Glucose-Capillary: 217 mg/dL — ABNORMAL HIGH (ref 70–99)
Glucose-Capillary: 229 mg/dL — ABNORMAL HIGH (ref 70–99)

## 2017-12-11 SURGERY — MRI WITH ANESTHESIA
Anesthesia: General | Laterality: Left

## 2017-12-11 MED ORDER — BUSPIRONE HCL 15 MG PO TABS: ORAL_TABLET | ORAL | 1 refills | Status: DC

## 2017-12-11 MED ORDER — ONDANSETRON HCL 4 MG/2ML IJ SOLN: 4.0000 mg | Freq: Once | INTRAMUSCULAR | Status: DC | PRN

## 2017-12-11 NOTE — Transfer of Care (Signed)
Immediate Anesthesia Transfer of Care Note  Patient: Catherine Pope  Procedure(s) Performed: MRI cervical spine and left shoulder WITH ANESTHESIA (Left )  Patient Location: PACU  Anesthesia Type:General  Level of Consciousness: awake, alert  and oriented  Airway & Oxygen Therapy: Patient Spontanous Breathing and Patient connected to nasal cannula oxygen  Post-op Assessment: Report given to RN and Post -op Vital signs reviewed and stable  Post vital signs: Reviewed and stable  Last Vitals:  Vitals Value Taken Time  BP 173/80 12/11/2017 10:21 AM  Temp    Pulse 101 12/11/2017 10:21 AM  Resp 17 12/11/2017 10:21 AM  SpO2 96 % 12/11/2017 10:21 AM  Vitals shown include unvalidated device data.  Last Pain:  Vitals:   12/11/17 0638  TempSrc:   PainSc: 6       Patients Stated Pain Goal: 3 (53/69/22 3009)  Complications: No apparent anesthesia complications

## 2017-12-11 NOTE — H&P (Signed)
Anesthesia H&P Update: History and Physical Exam reviewed; patient is OK for planned anesthetic and procedure. ? ?

## 2017-12-11 NOTE — Anesthesia Preprocedure Evaluation (Signed)
Anesthesia Evaluation  Patient identified by MRN, date of birth, ID band Patient awake    Reviewed: Allergy & Precautions, NPO status , Patient's Chart, lab work & pertinent test results  History of Anesthesia Complications Negative for: history of anesthetic complications  Airway Mallampati: III  TM Distance: >3 FB Neck ROM: Full  Mouth opening: Limited Mouth Opening  Dental  (+) Missing Multiple missing teeth. No loose teeth or dentures.:   Pulmonary Current Smoker,    breath sounds clear to auscultation       Cardiovascular hypertension,  Rhythm:Regular Rate:Normal     Neuro/Psych Anxiety Depression negative neurological ROS     GI/Hepatic negative GI ROS, Neg liver ROS,   Endo/Other  diabetes, Insulin Dependent  Renal/GU negative Renal ROS  negative genitourinary   Musculoskeletal negative musculoskeletal ROS (+)   Abdominal (+) + obese,   Peds  Hematology negative hematology ROS (+)   Anesthesia Other Findings   Reproductive/Obstetrics                             Anesthesia Physical Anesthesia Plan  ASA: III  Anesthesia Plan: General   Post-op Pain Management:    Induction:   PONV Risk Score and Plan: 2 and Ondansetron and Dexamethasone  Airway Management Planned: LMA  Additional Equipment:   Intra-op Plan:   Post-operative Plan: Extubation in OR  Informed Consent: I have reviewed the patients History and Physical, chart, labs and discussed the procedure including the risks, benefits and alternatives for the proposed anesthesia with the patient or authorized representative who has indicated his/her understanding and acceptance.     Plan Discussed with:   Anesthesia Plan Comments:         Anesthesia Quick Evaluation

## 2017-12-11 NOTE — Anesthesia Postprocedure Evaluation (Signed)
Anesthesia Post Note  Patient: Catherine Pope  Procedure(s) Performed: MRI cervical spine and left shoulder WITH ANESTHESIA (Left )     Patient location during evaluation: PACU Anesthesia Type: General Level of consciousness: awake and alert Pain management: pain level controlled Vital Signs Assessment: post-procedure vital signs reviewed and stable Respiratory status: spontaneous breathing, nonlabored ventilation, respiratory function stable and patient connected to nasal cannula oxygen Cardiovascular status: blood pressure returned to baseline and stable Postop Assessment: no apparent nausea or vomiting Anesthetic complications: no    Last Vitals:  Vitals:   12/11/17 1052 12/11/17 1059  BP: (!) 156/79 (!) 156/76  Pulse:  95  Resp:  16  Temp: (!) 36.1 C   SpO2:  97%    Last Pain:  Vitals:   12/11/17 1053  TempSrc:   PainSc: 0-No pain                 [name]

## 2017-12-12 ENCOUNTER — Encounter (HOSPITAL_COMMUNITY): Payer: Self-pay | Admitting: Radiology

## 2017-12-12 ENCOUNTER — Other Ambulatory Visit (INDEPENDENT_AMBULATORY_CARE_PROVIDER_SITE_OTHER): Payer: Self-pay | Admitting: Physician Assistant

## 2017-12-12 DIAGNOSIS — Z760 Encounter for issue of repeat prescription: Secondary | ICD-10-CM

## 2017-12-12 MED FILL — HYDROCHLOROTHIAZIDE 12.5 MG: 12.5 | 30 days supply | Qty: 30 | Fill #1

## 2017-12-12 MED FILL — CETIRIZINE HCL 10 MG TABLET: 10 | 30 days supply | Qty: 30 | Fill #11

## 2017-12-12 MED FILL — LISINOPRIL 20 MG TAB: 20 | 30 days supply | Qty: 30 | Fill #1

## 2017-12-14 ENCOUNTER — Other Ambulatory Visit (INDEPENDENT_AMBULATORY_CARE_PROVIDER_SITE_OTHER): Payer: Self-pay | Admitting: Physician Assistant

## 2017-12-14 ENCOUNTER — Other Ambulatory Visit: Payer: Self-pay | Admitting: Family Medicine

## 2017-12-14 DIAGNOSIS — Z760 Encounter for issue of repeat prescription: Secondary | ICD-10-CM

## 2017-12-14 MED FILL — TRADJENTA 5 MG TABLET: 5 | 30 days supply | Qty: 30 | Fill #1

## 2017-12-18 ENCOUNTER — Ambulatory Visit
Admission: RE | Admit: 2017-12-18 | Discharge: 2017-12-18 | Disposition: A | Discharge status: Home / Self Care | Payer: Medicaid Other | Source: Ambulatory Visit | Attending: Physician Assistant | Admitting: Physician Assistant

## 2017-12-18 DIAGNOSIS — Z1239 Encounter for other screening for malignant neoplasm of breast: Secondary | ICD-10-CM

## 2017-12-18 MED FILL — TOPIRAMATE 50 MG TABLET: 50 | 30 days supply | Qty: 60 | Fill #0

## 2017-12-19 MED FILL — LANTUS SOLOSTAR 100 UNITS/M: 100 | 24 days supply | Qty: 30 | Fill #4

## 2017-12-26 ENCOUNTER — Telehealth (INDEPENDENT_AMBULATORY_CARE_PROVIDER_SITE_OTHER): Payer: Self-pay | Admitting: Physician Assistant

## 2017-12-26 MED FILL — GABAPENTIN 300 MG CAPSULE: 300 | 30 days supply | Qty: 90 | Fill #4

## 2017-12-26 NOTE — Telephone Encounter (Signed)
Patient called in request for   cyclobenzaprine (FLEXERIL) 10 MG tablet   cetirizine (ZYRTEC) 10 MG tablet  Patient would like for medication to be sent to [facility] pharmacy  Please Advice [phone]  Thank you Emmit Pomfret

## 2017-12-26 NOTE — Telephone Encounter (Signed)
Needs visit

## 2017-12-26 NOTE — Telephone Encounter (Signed)
FWD to PCP. Deirdre Gryder S Tarik Teixeira, CMA  

## 2017-12-27 NOTE — Telephone Encounter (Signed)
Patient aware and has been scheduled for 01/28/18. Nat Christen, CMA

## 2017-12-28 ENCOUNTER — Ambulatory Visit: Payer: Medicaid Other | Admitting: Obstetrics & Gynecology

## 2017-12-28 ENCOUNTER — Encounter: Payer: Self-pay | Admitting: Obstetrics & Gynecology

## 2017-12-28 ENCOUNTER — Other Ambulatory Visit (HOSPITAL_COMMUNITY)
Admission: RE | Admit: 2017-12-28 | Discharge: 2017-12-28 | Disposition: A | Discharge status: Home / Self Care | Payer: Medicaid Other | Source: Ambulatory Visit | Attending: Obstetrics & Gynecology | Admitting: Obstetrics & Gynecology

## 2017-12-28 VITALS — BP 152/84 | HR 105 | Wt 247.0 lb

## 2017-12-28 DIAGNOSIS — R87622 Low grade squamous intraepithelial lesion on cytologic smear of vagina (LGSIL): Secondary | ICD-10-CM | POA: Diagnosis not present

## 2017-12-28 DIAGNOSIS — R87612 Low grade squamous intraepithelial lesion on cytologic smear of cervix (LGSIL): Secondary | ICD-10-CM

## 2017-12-28 MED ORDER — IMIQUIMOD 5 % EX CREA: TOPICAL_CREAM | CUTANEOUS | 3 refills | Status: DC

## 2017-12-28 MED FILL — IMIQUIMOD 5 % CREA: 5 | 4 days supply | Qty: 12 | Fill #0

## 2017-12-28 NOTE — Progress Notes (Signed)
   Subjective:    Patient ID: Catherine Pope, female    DOB: 04/13/1965, 52 y.o.   MRN: [id number]  HPI 52 yo celibate P2 here for colpo due to LGSIL of a vaginal pap smear. She also complains of a "yeast" infection.   Review of Systems Mammogram up to date Flu vaccine up to date    Objective:   Physical Exam  Breathing, conversing, and ambulating normally Morbidly obese, well hydrated Black female, no apparent distress UPT negative, consent signed, time out done Vaginal cuff prepped with acetic acid. 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 I biopsied this area. Silver nitrate achieved hemostasis. 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 She tolerated the procedure well.     Assessment & Plan:  Condyloma- aldara prescribed LGSIL pap of vaginal cuff- await pathology Come back 5 months

## 2017-12-31 MED FILL — QUETIAPINE FUMARATE 100 MG: 100 | 30 days supply | Qty: 30 | Fill #1

## 2018-01-02 ENCOUNTER — Other Ambulatory Visit (INDEPENDENT_AMBULATORY_CARE_PROVIDER_SITE_OTHER): Payer: Self-pay | Admitting: Physician Assistant

## 2018-01-02 NOTE — Telephone Encounter (Signed)
FWD to PCP. Joshawa Dubin S Charmon Thorson, CMA  

## 2018-01-14 MED FILL — GABAPENTIN 300 MG CAPSULE: 300 | 30 days supply | Qty: 30 | Fill #1

## 2018-01-14 MED FILL — IMIQUIMOD 5 % CREA: 5 | 4 days supply | Qty: 12 | Fill #1

## 2018-01-14 MED FILL — LANTUS SOLOSTAR 100 UNITS/M: 100 | 24 days supply | Qty: 30 | Fill #5

## 2018-01-15 MED FILL — HYDROCHLOROTHIAZIDE 12.5 MG: 12.5 | 30 days supply | Qty: 30 | Fill #2

## 2018-01-16 ENCOUNTER — Ambulatory Visit (AMBULATORY_SURGERY_CENTER): Payer: Self-pay

## 2018-01-16 VITALS — Ht 66.0 in | Wt 250.2 lb

## 2018-01-16 DIAGNOSIS — B977 Papillomavirus as the cause of diseases classified elsewhere: Secondary | ICD-10-CM

## 2018-01-16 DIAGNOSIS — Z1211 Encounter for screening for malignant neoplasm of colon: Secondary | ICD-10-CM

## 2018-01-16 HISTORY — DX: Papillomavirus as the cause of diseases classified elsewhere: B97.7

## 2018-01-16 MED ORDER — NA SULFATE-K SULFATE-MG SULF 17.5-3.13-1.6 GM/177ML PO SOLN: 1.0000 | Freq: Once | ORAL | 0 refills | Status: AC

## 2018-01-16 NOTE — Progress Notes (Signed)
Per pt, no allergies to soy or egg products.Pt not taking any weight loss meds or using  O2 at home.  Pt refused emmi video. 

## 2018-01-28 ENCOUNTER — Encounter (INDEPENDENT_AMBULATORY_CARE_PROVIDER_SITE_OTHER): Payer: Self-pay | Admitting: Physician Assistant

## 2018-01-28 ENCOUNTER — Ambulatory Visit (INDEPENDENT_AMBULATORY_CARE_PROVIDER_SITE_OTHER): Payer: Medicaid Other | Admitting: Physician Assistant

## 2018-01-28 ENCOUNTER — Telehealth: Payer: Self-pay | Admitting: Gastroenterology

## 2018-01-28 VITALS — BP 148/82 | HR 98 | Temp 98.0°F | Resp 18 | Ht 67.0 in | Wt 238.0 lb

## 2018-01-28 DIAGNOSIS — I10 Essential (primary) hypertension: Secondary | ICD-10-CM

## 2018-01-28 DIAGNOSIS — R51 Headache: Secondary | ICD-10-CM | POA: Diagnosis not present

## 2018-01-28 DIAGNOSIS — F418 Other specified anxiety disorders: Secondary | ICD-10-CM | POA: Diagnosis not present

## 2018-01-28 DIAGNOSIS — R519 Headache, unspecified: Secondary | ICD-10-CM

## 2018-01-28 MED ORDER — HYDROXYZINE HCL 10 MG PO TABS: 10.0000 mg | ORAL_TABLET | Freq: Two times a day (BID) | ORAL | 2 refills | Status: DC | PRN

## 2018-01-28 MED ORDER — HYDROCHLOROTHIAZIDE 25 MG PO TABS: 25.0000 mg | ORAL_TABLET | Freq: Every day | ORAL | 3 refills | Status: DC

## 2018-01-28 MED ORDER — TOPIRAMATE 50 MG PO TABS: ORAL_TABLET | ORAL | 1 refills | Status: DC

## 2018-01-28 MED ORDER — SUMATRIPTAN SUCCINATE 25 MG PO TABS: 25.0000 mg | ORAL_TABLET | Freq: Every day | ORAL | 0 refills | Status: DC

## 2018-01-28 MED FILL — SUMATRIPTAN SUCC 25 MG TAB: 25 | 30 days supply | Qty: 9 | Fill #0

## 2018-01-28 MED FILL — TOPIRAMATE 50 MG TABLET: 50 | 30 days supply | Qty: 90 | Fill #0

## 2018-01-28 MED FILL — hydrOXYzine HCL 10 MG TABS: 10 | 30 days supply | Qty: 60 | Fill #0

## 2018-01-28 NOTE — Patient Instructions (Signed)

## 2018-01-28 NOTE — Progress Notes (Signed)
Subjective:  Patient ID: Catherine Pope, female    DOB: [birth year]  Age: 52 y.o. MRN: [id number]  CC: migraine, medication refill  HPI  Catherine Pope a 52 y.o.femalewith a medical history of DM2, HTN, anxiety, depression, migraines, tobacco use disorder, sinusitis, trochanteric bursitis, lateral epicondylitis, s/p surgical decompression of c-spine, and morbid obesity presents to f/u on HTN. Last BP 162/93 mmHg two months ago. BP 148/82 mmHg today. Taking medications as directed. Does not endorse CP, palpitations, SOB, abdominal pain, swelling, presyncope, or syncope.     Pt endorses a waxing and waning headache over the past two weeks. Associated with nausea, smell hypersensitivity, floating lights, photophobia, phonophobia, and irritation. Headaches last approximately 2-3 hours and self resolve. Headache may return again in the evening or night. Has been occurring daily. Current headache is 4/10 and located on the right periorbital/temporal region. Pt also feels generalized by aches and fatigue. No close contacts with illness and no fever/chills. Tried taking aleve and advil without any relief of symptoms. Still complains of problems with allergies. Has run out of cetirizine.     Outpatient Medications Prior to Visit  Medication Sig Dispense Refill  . APPLE CIDER VINEGAR PO Take 450 mg by mouth 2 (two) times daily.    . busPIRone (BUSPAR) 15 MG tablet Take 1 tablet by mouth three times daily 90 tablet 1  . Cholecalciferol (VITAMIN D PO) Take 500 Units by mouth daily.     . clonazePAM (KLONOPIN) 0.5 MG tablet Take 1 tablet (0.5 mg total) by mouth 2 (two) times daily as needed for anxiety. 30 tablet 0  . Cyanocobalamin (VITAMIN B 12 PO) Take 1,000 mg by mouth daily.    . Diclofenac Sodium 3 % GEL Apply 1 application topically daily as needed (pain).   1  . fluticasone (FLONASE) 50 MCG/ACT nasal spray Place 2 sprays into both nostrils as needed.   1  . folic acid (FOLVITE) 950 MCG tablet  Take 400 mcg by mouth daily. Take 2 pills in am and 2 pills at night    . gabapentin (NEURONTIN) 300 MG capsule Take 1 capsule (300 mg total) by mouth 3 (three) times daily. 90 capsule 5  . glucose blood (TRUE METRIX BLOOD GLUCOSE TEST) test strip Use as instructed 100 each 12  . hydrochlorothiazide (HYDRODIURIL) 12.5 MG tablet Take 1 tablet (12.5 mg total) by mouth daily. 30 tablet 5  . imiquimod (ALDARA) 5 % cream Apply topically 3 (three) times a week. 12 each 3  . insulin glargine (LANTUS) 100 unit/mL SOPN Inject 1.3 mLs (130 Units total) into the skin at bedtime. (Patient taking differently: Inject 120 Units into the skin every morning. ) 15 pen 11  . Lancets (ONETOUCH ULTRASOFT) lancets Use as instructed 100 each 12  . lisinopril (PRINIVIL,ZESTRIL) 20 MG tablet Take 1 tablet (20 mg total) by mouth daily. 30 tablet 5  . lovastatin (MEVACOR) 20 MG tablet Take 1 tablet (20 mg total) by mouth at bedtime. 90 tablet 3  . Omega-3 Fatty Acids (FISH OIL PO) Take 360 mg by mouth. Take 2 pills at bedtime    . QUEtiapine (SEROQUEL) 100 MG tablet Take 1 tablet (100 mg total) by mouth at bedtime. 30 tablet 4  . topiramate (TOPAMAX) 50 MG tablet Take 50 mg by mouth 2 (two) times daily.    Marland Kitchen albuterol (PROVENTIL HFA;VENTOLIN HFA) 108 (90 Base) MCG/ACT inhaler Inhale 1-2 puffs into the lungs every 6 (six) hours as needed for wheezing or shortness  of breath. (Patient not taking: Reported on 12/28/2017) 1 Inhaler 0  . cetirizine (ZYRTEC) 10 MG tablet Take 1 tablet (10 mg total) by mouth daily. (Patient not taking: Reported on 12/28/2017) 30 tablet 11  . cyclobenzaprine (FLEXERIL) 10 MG tablet Take 1 tablet (10 mg total) by mouth 3 (three) times daily as needed for muscle spasms. (Patient not taking: Reported on 12/28/2017) 30 tablet 0  . HYDROcodone-acetaminophen (NORCO/VICODIN) 5-325 MG tablet Take 1 tablet by mouth every 8 (eight) hours as needed for pain.  0   No facility-administered medications prior to visit.       ROS Review of Systems  Constitutional: Positive for malaise/fatigue. Negative for chills and fever.  Eyes: Negative for blurred vision.  Respiratory: Negative for shortness of breath.   Cardiovascular: Negative for chest pain and palpitations.  Gastrointestinal: Negative for abdominal pain and nausea.  Genitourinary: Negative for dysuria and hematuria.  Musculoskeletal: Negative for joint pain and myalgias.  Skin: Negative for rash.  Neurological: Positive for headaches. Negative for tingling.  Psychiatric/Behavioral: Positive for depression. The patient is nervous/anxious.     Objective:  BP (!) 148/82 (BP Location: Right Arm, Patient Position: Sitting, Cuff Size: Large)   Pulse 98   Temp 98 F (36.7 C) (Oral)   Resp 18   Ht 5\' 7"  (1.702 m)   Wt 238 lb (108 kg)   SpO2 98%   BMI 37.28 kg/m   BP/Weight 01/28/2018 01/16/2018 9/51/8841  Systolic BP 660 - 630  Diastolic BP 82 - 84  Wt. (Lbs) 238 250.2 247  BMI 37.28 40.38 39.87      Physical Exam  Constitutional: She is oriented to person, place, and time.  Well developed, obese, NAD, polite  HENT:  Head: Normocephalic and atraumatic.  Eyes: No scleral icterus.  Neck: Normal range of motion. Neck supple. No thyromegaly present.  Cardiovascular: Normal rate, regular rhythm and normal heart sounds.  No LE edema bilaterally  Pulmonary/Chest: Effort normal and breath sounds normal.  Musculoskeletal: She exhibits no edema.  Neurological: She is alert and oriented to person, place, and time.  Skin: Skin is warm and dry. No rash noted. No erythema. No pallor.  Psychiatric: Her behavior is normal. Thought content normal.  Depressed, constricted affect  Vitals reviewed.    Assessment & Plan:    1. Essential hypertension - Not optimally controlled - Increase hydrochlorothiazide (HYDRODIURIL) 25 MG tablet; Take 1 tablet (25 mg total) by mouth daily.  Dispense: 90 tablet; Refill: 3 - CBC with Differential -  Comprehensive metabolic panel - TSH  2. Frequent headaches - Increase topiramate (TOPAMAX) 50 MG tablet; Take two pills at night and one pill 12 hours after daily.  Dispense: 90 tablet; Refill: 1 - Begin SUMAtriptan (IMITREX) 25 MG tablet; Take 1 tablet (25 mg total) by mouth daily. May take one more tablet two hours after the first. No more than two tablets per day.  Dispense: 30 tablet; Refill: 0  3. Depression with anxiety - Begin hydrOXYzine (ATARAX/VISTARIL) 10 MG tablet; Take 1 tablet (10 mg total) by mouth 2 (two) times daily as needed.  Dispense: 60 tablet; Refill: 2 - Discontinue clonazepam 0.5 mg.  - Continue Seroquel and Buspar - Pt given appointment details of her psychiatry appointment    Meds ordered this encounter  Medications  . hydrochlorothiazide (HYDRODIURIL) 25 MG tablet    Sig: Take 1 tablet (25 mg total) by mouth daily.    Dispense:  90 tablet    Refill:  3    Order Specific Question:   Supervising Provider    Answer:   Charlott Rakes [[id number]]  . topiramate (TOPAMAX) 50 MG tablet    Sig: Take two pills at night and one pill 12 hours after daily.    Dispense:  90 tablet    Refill:  1    Order Specific Question:   Supervising Provider    Answer:   Charlott Rakes [[id number]]  . SUMAtriptan (IMITREX) 25 MG tablet    Sig: Take 1 tablet (25 mg total) by mouth daily. May take one more tablet two hours after the first. No more than two tablets per day.    Dispense:  30 tablet    Refill:  0    Order Specific Question:   Supervising Provider    Answer:   Charlott Rakes [[id number]]  . hydrOXYzine (ATARAX/VISTARIL) 10 MG tablet    Sig: Take 1 tablet (10 mg total) by mouth 2 (two) times daily as needed.    Dispense:  60 tablet    Refill:  2    Order Specific Question:   Supervising Provider    Answer:   Charlott Rakes [[id number]]    Follow-up: Return in about 4 weeks (around 02/25/2018) for Headache.   Clent Demark PA

## 2018-01-28 NOTE — Telephone Encounter (Signed)
Patient called and stated she ate a biscuit and drank a cherry drink for breakfast. Colonoscopy for tomorrow 10.29.19 was canceled. Patient rescheduled to 12.6.19 and instructions were reviewed. Pt is on Medicaid. FYI of Late Cancellation

## 2018-01-29 ENCOUNTER — Encounter: Payer: Medicaid Other | Admitting: Gastroenterology

## 2018-01-29 ENCOUNTER — Other Ambulatory Visit: Payer: Self-pay | Admitting: Family Medicine

## 2018-01-29 LAB — CBC WITH DIFFERENTIAL/PLATELET
Basophils Absolute: 0 10*3/uL (ref 0.0–0.2)
Basos: 0 %
EOS (ABSOLUTE): 0.1 10*3/uL (ref 0.0–0.4)
Eos: 1 %
Hematocrit: 40.5 % (ref 34.0–46.6)
Hemoglobin: 13.6 g/dL (ref 11.1–15.9)
Immature Grans (Abs): 0 10*3/uL (ref 0.0–0.1)
Immature Granulocytes: 0 %
Lymphocytes Absolute: 3.3 10*3/uL — ABNORMAL HIGH (ref 0.7–3.1)
Lymphs: 36 %
MCH: 29.4 pg (ref 26.6–33.0)
MCHC: 33.6 g/dL (ref 31.5–35.7)
MCV: 88 fL (ref 79–97)
Monocytes Absolute: 0.7 10*3/uL (ref 0.1–0.9)
Monocytes: 7 %
Neutrophils Absolute: 5.1 10*3/uL (ref 1.4–7.0)
Neutrophils: 56 %
Platelets: 280 10*3/uL (ref 150–450)
RBC: 4.62 x10E6/uL (ref 3.77–5.28)
RDW: 13.7 % (ref 12.3–15.4)
WBC: 9.1 10*3/uL (ref 3.4–10.8)

## 2018-01-29 LAB — COMPREHENSIVE METABOLIC PANEL
ALT: 31 IU/L (ref 0–32)
AST: 37 IU/L (ref 0–40)
Albumin/Globulin Ratio: 1.4 (ref 1.2–2.2)
Albumin: 4.3 g/dL (ref 3.5–5.5)
Alkaline Phosphatase: 72 IU/L (ref 39–117)
BUN/Creatinine Ratio: 6 — ABNORMAL LOW (ref 9–23)
BUN: 5 mg/dL — ABNORMAL LOW (ref 6–24)
Bilirubin Total: 0.3 mg/dL (ref 0.0–1.2)
CO2: 22 mmol/L (ref 20–29)
Calcium: 9.4 mg/dL (ref 8.7–10.2)
Chloride: 103 mmol/L (ref 96–106)
Creatinine, Ser: 0.78 mg/dL (ref 0.57–1.00)
GFR calc Af Amer: 103 mL/min/{1.73_m2} (ref >59)
GFR calc non Af Amer: 89 mL/min/{1.73_m2} (ref >59)
Globulin, Total: 3 g/dL (ref 1.5–4.5)
Glucose: 121 mg/dL — ABNORMAL HIGH (ref 65–99)
Potassium: 3.1 mmol/L — ABNORMAL LOW (ref 3.5–5.2)
Sodium: 142 mmol/L (ref 134–144)
Total Protein: 7.3 g/dL (ref 6.0–8.5)

## 2018-01-29 LAB — TSH: TSH: 2.45 u[IU]/mL (ref 0.450–4.500)

## 2018-02-01 ENCOUNTER — Other Ambulatory Visit (INDEPENDENT_AMBULATORY_CARE_PROVIDER_SITE_OTHER): Payer: Self-pay | Admitting: Physician Assistant

## 2018-02-01 ENCOUNTER — Telehealth (INDEPENDENT_AMBULATORY_CARE_PROVIDER_SITE_OTHER): Payer: Self-pay

## 2018-02-01 DIAGNOSIS — E876 Hypokalemia: Secondary | ICD-10-CM

## 2018-02-01 MED ORDER — POTASSIUM CHLORIDE CRYS ER 20 MEQ PO TBCR: 20.0000 meq | EXTENDED_RELEASE_TABLET | Freq: Two times a day (BID) | ORAL | 0 refills | Status: DC

## 2018-02-01 MED FILL — QUETIAPINE FUMARATE 100 MG: 100 | 30 days supply | Qty: 30 | Fill #2

## 2018-02-01 MED FILL — POTASSIUM CL ER 20 MEQ TAB: 20 | 30 days supply | Qty: 60 | Fill #0

## 2018-02-01 NOTE — Telephone Encounter (Signed)
-----   Message from Clent Demark, PA-C sent at 02/01/2018 12:18 PM EDT ----- Potassium low. I have sent Potassium pills to [facility]. Rest of labs normal.

## 2018-02-01 NOTE — Telephone Encounter (Signed)
Patient is aware that potassium is low and potassium pills has been sent to [facility]. All other labs normal. Nat Christen, CMA

## 2018-02-05 ENCOUNTER — Other Ambulatory Visit: Payer: Self-pay | Admitting: Endocrinology

## 2018-02-05 MED FILL — LANTUS SOLOSTAR 100 UNITS/M: 100 | 12 days supply | Qty: 15 | Fill #0

## 2018-02-05 MED FILL — IMIQUIMOD 5 % CREA: 5 | 4 days supply | Qty: 12 | Fill #2

## 2018-02-06 ENCOUNTER — Telehealth: Payer: Self-pay

## 2018-02-06 ENCOUNTER — Other Ambulatory Visit: Payer: Self-pay | Admitting: Physician Assistant

## 2018-02-06 NOTE — Telephone Encounter (Signed)
Patient called and wants to schedule an appointment to re-establish. I have connected her to the front desk to make an appointment. Thanks!

## 2018-02-06 NOTE — Telephone Encounter (Signed)
FWD to PCP. Rook Maue S Yazmine Sorey, CMA  

## 2018-02-07 MED FILL — HYDROCHLOROTHIAZIDE 25 MG T: 25 | 90 days supply | Qty: 90 | Fill #0

## 2018-02-14 ENCOUNTER — Encounter: Payer: Self-pay | Admitting: Endocrinology

## 2018-02-14 ENCOUNTER — Ambulatory Visit (INDEPENDENT_AMBULATORY_CARE_PROVIDER_SITE_OTHER): Payer: Medicaid Other | Admitting: Endocrinology

## 2018-02-14 VITALS — BP 138/60 | HR 88 | Ht 66.0 in | Wt 245.6 lb

## 2018-02-14 DIAGNOSIS — E118 Type 2 diabetes mellitus with unspecified complications: Secondary | ICD-10-CM | POA: Diagnosis not present

## 2018-02-14 DIAGNOSIS — Z794 Long term (current) use of insulin: Secondary | ICD-10-CM | POA: Diagnosis not present

## 2018-02-14 LAB — POCT GLYCOSYLATED HEMOGLOBIN (HGB A1C): Hemoglobin A1C: 9.5 % — AB (ref 4.0–5.6)

## 2018-02-14 MED ORDER — INSULIN GLARGINE 100 UNIT/ML SOLOSTAR PEN: 140.0000 [IU] | PEN_INJECTOR | SUBCUTANEOUS | 11 refills | Status: DC

## 2018-02-14 MED FILL — TRUEPLUS PEN NDL 31GX3/16: 31G X 5 MM | 30 days supply | Qty: 100 | Fill #0

## 2018-02-14 MED FILL — TRUEPLUS PEN NDL 31GX3/16": 31G X 5 MM | 30 days supply | Qty: 100 | Fill #0

## 2018-02-14 MED FILL — LANTUS SOLOSTAR 100 UNITS/M: 100 | 21 days supply | Qty: 30 | Fill #0

## 2018-02-14 NOTE — Patient Instructions (Addendum)
check your blood sugar twice a day.  vary the time of day when you check, between before the 3 meals, and at bedtime.  also check if you have symptoms of your blood sugar being too high or too low.  please keep a record of the readings and bring it to your next appointment here (or you can bring the meter itself).  You can write it on any piece of paper.  please call us sooner if your blood sugar goes below 70, or if you have a lot of readings over 200.    Please increase the insulin to 140 units each morning.  Please come back for a follow-up appointment in 2 months.

## 2018-02-14 NOTE — Progress Notes (Signed)
Subjective:    Patient ID: Catherine Pope, female    DOB: 12/13/1965, 52 y.o.   MRN: [id number]  HPI Pt returns for f/u of diabetes mellitus: DM type: Insulin-requiring type 2 Dx'ed: 6237 Complications: none Therapy: insulin since 2018 GDM: never DKA: never Severe hypoglycemia: never Pancreatitis: never Pancreatic imaging: normal on 2005 CT.   Other: she takes qd insulin, after poor results with multiple daily injections; She gets meds at [facility].  Interval history: no cbg record, but states cbg's are in the mid-100's.  There is no trend throughout the day.  Pt says she seldom misses the insulin.  pt states she feels well in general. Past Medical History:  Diagnosis Date  . Anxiety   . Depression   . Diabetes mellitus    dx back in 2010  . History of claustrophobia   . 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 Hypertension   . Migraine   . Sinusitis     Past Surgical History:  Procedure Laterality Date  . ABDOMINAL HYSTERECTOMY     per pt , in her 20's  . ANTERIOR CERVICAL DECOMP/DISCECTOMY FUSION N/A 12/27/2016   Procedure: CERVICAL SIX-SEVEN ANTERIOR CERVICAL DECOMPRESSION/DISCECTOMY FUSION;  Surgeon: Eustace Moore, MD;  Location: [place];  Service: Neurosurgery;  Laterality: N/A;  . ANTERIOR CRUCIATE LIGAMENT REPAIR     right knee  . BREAST EXCISIONAL BIOPSY Left over 20 years ago   benign  . BREAST SURGERY     2 lumps removed at age 26, left breast/ benign  . RADIOLOGY WITH ANESTHESIA Left 12/11/2017   Procedure: MRI cervical spine and left shoulder WITH ANESTHESIA;  Surgeon: Radiologist, Medication, MD;  Location: [place];  Service: Radiology;  Laterality: Left;  . SHOULDER ARTHROSCOPY     right shoulder    Social History   Socioeconomic History  . Marital status: Single    Spouse name: Not on file  . Number of children: 2  . Years of education: 41 th  . Highest education level: Not on file  Occupational  History  . Occupation: unemployed  Social Needs  . Financial resource strain: Not on file  . Food insecurity:    Worry: Sometimes true    Inability: Sometimes true  . Transportation needs:    Medical: Not on file    Non-medical: Not on file  Tobacco Use  . Smoking status: Current Every Day Smoker    Packs/day: 0.50    Years: 15.00    Pack years: 7.50    Types: Cigarettes  . Smokeless tobacco: Never Used  . Tobacco comment: less than half pack a day.  Substance and Sexual Activity  . Alcohol use: No  . Drug use: No  . Sexual activity: Not Currently  Lifestyle  . Physical activity:    Days per week: Not on file    Minutes per session: Not on file  . Stress: Not on file  Relationships  . Social connections:    Talks on phone: Not on file    Gets together: Not on file    Attends religious service: Not on file    Active member of club or organization: Not on file    Attends meetings of clubs or organizations: Not on file    Relationship status: Not on file  . Intimate partner violence:    Fear of current or ex partner: Not on file    Emotionally abused:  Not on file    Physically abused: Not on file    Forced sexual activity: Not on file  Other Topics Concern  . Not on file  Social History Narrative   Patient lives at home with her daughters and she is unemployed. Patient has two children.   Patient has a high school education.   Right handed.    Caffeine two cups daily.    Current Outpatient Medications on File Prior to Visit  Medication Sig Dispense Refill  . APPLE CIDER VINEGAR PO Take 450 mg by mouth 2 (two) times daily.    . busPIRone (BUSPAR) 15 MG tablet TAKE 1 TABLET(15 MG) BY MOUTH THREE TIMES DAILY 90 tablet 1  . Cholecalciferol (VITAMIN D PO) Take 500 Units by mouth daily.     . Cyanocobalamin (VITAMIN B 12 PO) Take 1,000 mg by mouth daily.    . Diclofenac Sodium 3 % GEL Apply 1 application topically daily as needed (pain).   1  . fluticasone (FLONASE) 50  MCG/ACT nasal spray Place 2 sprays into both nostrils as needed.   1  . folic acid (FOLVITE) 220 MCG tablet Take 400 mcg by mouth daily. Take 2 pills in am and 2 pills at night    . gabapentin (NEURONTIN) 300 MG capsule Take 1 capsule (300 mg total) by mouth 3 (three) times daily. 90 capsule 5  . glucose blood (TRUE METRIX BLOOD GLUCOSE TEST) test strip Use as instructed 100 each 12  . hydrochlorothiazide (HYDRODIURIL) 25 MG tablet Take 1 tablet (25 mg total) by mouth daily. 90 tablet 3  . hydrOXYzine (ATARAX/VISTARIL) 10 MG tablet Take 1 tablet (10 mg total) by mouth 2 (two) times daily as needed. 60 tablet 2  . imiquimod (ALDARA) 5 % cream Apply topically 3 (three) times a week. 12 each 3  . Lancets (ONETOUCH ULTRASOFT) lancets Use as instructed 100 each 12  . lisinopril (PRINIVIL,ZESTRIL) 20 MG tablet Take 1 tablet (20 mg total) by mouth daily. 30 tablet 5  . lovastatin (MEVACOR) 20 MG tablet Take 1 tablet (20 mg total) by mouth at bedtime. 90 tablet 3  . Omega-3 Fatty Acids (FISH OIL PO) Take 360 mg by mouth. Take 2 pills at bedtime    . potassium chloride SA (K-DUR,KLOR-CON) 20 MEQ tablet Take 1 tablet (20 mEq total) by mouth 2 (two) times daily. 60 tablet 0  . QUEtiapine (SEROQUEL) 100 MG tablet Take 1 tablet (100 mg total) by mouth at bedtime. 30 tablet 4  . SUMAtriptan (IMITREX) 25 MG tablet Take 1 tablet (25 mg total) by mouth daily. May take one more tablet two hours after the first. No more than two tablets per day. (Patient taking differently: Take 25 mg by mouth daily as needed. May take one more tablet two hours after the first. No more than two tablets per day.) 30 tablet 0  . topiramate (TOPAMAX) 50 MG tablet Take two pills at night and one pill 12 hours after daily. 90 tablet 1  . albuterol (PROVENTIL HFA;VENTOLIN HFA) 108 (90 Base) MCG/ACT inhaler Inhale 1-2 puffs into the lungs every 6 (six) hours as needed for wheezing or shortness of breath. (Patient not taking: Reported on  02/14/2018) 1 Inhaler 0  . HYDROcodone-acetaminophen (NORCO/VICODIN) 5-325 MG tablet Take 1 tablet by mouth every 8 (eight) hours as needed for pain.  0   No current facility-administered medications on file prior to visit.     No Known Allergies  Family History  Problem Relation  Age of Onset  . Heart Problems Father   . Diabetes Father   . Hypertension Other   . Heart disease Other   . Stroke Sister   . Kidney disease Brother     BP 138/60 (BP Location: Right Arm, Patient Position: Sitting, Cuff Size: Large)   Pulse 88   Ht 5\' 6"  (1.676 m)   Wt 245 lb 9.6 oz (111.4 kg)   SpO2 97%   BMI 39.64 kg/m    Review of Systems She denies hypoglycemia    Objective:   Physical Exam VITAL SIGNS:  See vs page GENERAL: no distress Pulses: foot pulses are intact bilaterally.   MSK: no deformity of the feet or ankles.  CV: no edema of the legs or ankles Skin:  no ulcer on the feet or ankles.  normal color and temp on the feet and ankles Neuro: sensation is intact to touch on the feet and ankles.   Lab Results  Component Value Date   HGBA1C 9.5 (A) 02/14/2018        Assessment & Plan:  Insulin-requiring type 2 DM: she needs increased rx    Patient Instructions  check your blood sugar twice a day.  vary the time of day when you check, between before the 3 meals, and at bedtime.  also check if you have symptoms of your blood sugar being too high or too low.  please keep a record of the readings and bring it to your next appointment here (or you can bring the meter itself).  You can write it on any piece of paper.  please call us sooner if your blood sugar goes below 70, or if you have a lot of readings over 200.    Please increase the insulin to 140 units each morning.  Please come back for a follow-up appointment in 2 months.

## 2018-02-19 ENCOUNTER — Encounter: Payer: Self-pay | Admitting: Family Medicine

## 2018-02-19 ENCOUNTER — Ambulatory Visit (INDEPENDENT_AMBULATORY_CARE_PROVIDER_SITE_OTHER): Payer: Medicaid Other | Admitting: Family Medicine

## 2018-02-19 VITALS — BP 140/68 | HR 94 | Temp 98.1°F | Ht 66.0 in | Wt 242.0 lb

## 2018-02-19 DIAGNOSIS — F419 Anxiety disorder, unspecified: Secondary | ICD-10-CM | POA: Diagnosis not present

## 2018-02-19 DIAGNOSIS — E118 Type 2 diabetes mellitus with unspecified complications: Secondary | ICD-10-CM

## 2018-02-19 DIAGNOSIS — Z794 Long term (current) use of insulin: Secondary | ICD-10-CM | POA: Diagnosis not present

## 2018-02-19 DIAGNOSIS — F329 Major depressive disorder, single episode, unspecified: Secondary | ICD-10-CM | POA: Diagnosis not present

## 2018-02-19 DIAGNOSIS — I10 Essential (primary) hypertension: Secondary | ICD-10-CM | POA: Diagnosis not present

## 2018-02-19 DIAGNOSIS — F32A Depression, unspecified: Secondary | ICD-10-CM

## 2018-02-19 DIAGNOSIS — Z09 Encounter for follow-up examination after completed treatment for conditions other than malignant neoplasm: Secondary | ICD-10-CM

## 2018-02-19 LAB — POCT URINALYSIS DIP (MANUAL ENTRY)
Bilirubin, UA: NEGATIVE
Blood, UA: NEGATIVE
Glucose, UA: NEGATIVE mg/dL
Ketones, POC UA: NEGATIVE mg/dL
Leukocytes, UA: NEGATIVE
Nitrite, UA: NEGATIVE
Protein Ur, POC: NEGATIVE mg/dL
Spec Grav, UA: <=1.005 — AB (ref 1.010–1.025)
Urobilinogen, UA: 0.2 E.U./dL
pH, UA: 6 (ref 5.0–8.0)

## 2018-02-19 MED ORDER — BUSPIRONE HCL 30 MG PO TABS: 30.0000 mg | ORAL_TABLET | Freq: Two times a day (BID) | ORAL | 6 refills | Status: DC

## 2018-02-19 NOTE — Progress Notes (Signed)
Follow Up  Subjective:    Patient ID: Catherine Pope, female    DOB: 08-12-1965, 52 y.o.   MRN: [id number]   Chief Complaint  Patient presents with  . Follow-up    chronic condition   . Migraine  . Foot Pain    left   HPI  Catherine Pope is a 52 year old female with a past medical history of Sinusitis, Migraine, Hypertension, HPV, Diabetes, Depression, and Anxiety. She is here today for assessment and follow up of chronic diseases.   Current Status: Since her last office visit, she is doing well with no complaints.   Body aches and migraine headaches increasing over the last 2 and 1/2 months. She states that her anxiety has been increased lately.She continues to follow up with Endocrinologist. She is having moderate situational anxiety today. She denies suicidal ideations, homicidal ideations, or auditory hallucinations.  She denies fevers, chills, recent infections, weight loss, and night sweats. She has not had any visual changes, and falls. No chest pain, heart palpitations, cough and shortness of breath reported. No reports of GI problems such as nausea, vomiting, diarrhea, and constipation. She has no reports of blood in stools, dysuria and hematuria.  She denies pain today.   Past Medical History:  Diagnosis Date  . Anxiety   . Depression   . Diabetes mellitus    dx back in 2010  . History of claustrophobia   . 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 Hypertension   . Migraine   . Sinusitis    Family History  Problem Relation Age of Onset  . Heart Problems Father   . Diabetes Father   . Hypertension Other   . Heart disease Other   . Stroke Sister   . Kidney disease Brother    Social History   Socioeconomic History  . Marital status: Single    Spouse name: Not on file  . Number of children: 2  . Years of education: 74 th  . Highest education level: Not on file  Occupational History  . Occupation:  unemployed  Social Needs  . Financial resource strain: Not on file  . Food insecurity:    Worry: Sometimes true    Inability: Sometimes true  . Transportation needs:    Medical: Not on file    Non-medical: Not on file  Tobacco Use  . Smoking status: Current Every Day Smoker    Packs/day: 0.50    Years: 15.00    Pack years: 7.50    Types: Cigarettes  . Smokeless tobacco: Never Used  . Tobacco comment: less than half pack a day.  Substance and Sexual Activity  . Alcohol use: No  . Drug use: No  . Sexual activity: Not Currently  Lifestyle  . Physical activity:    Days per week: Not on file    Minutes per session: Not on file  . Stress: Not on file  Relationships  . Social connections:    Talks on phone: Not on file    Gets together: Not on file    Attends religious service: Not on file    Active member of club or organization: Not on file    Attends meetings of clubs or organizations: Not on file    Relationship status: Not on file  . Intimate partner violence:    Fear of current or ex partner: Not on file    Emotionally abused:  Not on file    Physically abused: Not on file    Forced sexual activity: Not on file  Other Topics Concern  . Not on file  Social History Narrative   Patient lives at home with her daughters and she is unemployed. Patient has two children.   Patient has a high school education.   Right handed.    Caffeine two cups daily.    Past Surgical History:  Procedure Laterality Date  . ABDOMINAL HYSTERECTOMY     per pt , in her 20's  . ANTERIOR CERVICAL DECOMP/DISCECTOMY FUSION N/A 12/27/2016   Procedure: CERVICAL SIX-SEVEN ANTERIOR CERVICAL DECOMPRESSION/DISCECTOMY FUSION;  Surgeon: Eustace Moore, MD;  Location: [facility];  Service: Neurosurgery;  Laterality: N/A;  . ANTERIOR CRUCIATE LIGAMENT REPAIR     right knee  . BREAST EXCISIONAL BIOPSY Left over 20 years ago   benign  . BREAST SURGERY     2 lumps removed at age 29, left breast/ benign  .  RADIOLOGY WITH ANESTHESIA Left 12/11/2017   Procedure: MRI cervical spine and left shoulder WITH ANESTHESIA;  Surgeon: Radiologist, Medication, MD;  Location: [facility];  Service: Radiology;  Laterality: Left;  . SHOULDER ARTHROSCOPY     right shoulder    Immunization History  Administered Date(s) Administered  . Influenza,inj,Quad PF,6+ Mos 01/05/2014, 12/14/2015, 12/25/2016, 11/21/2017  . Influenza-Unspecified 04/28/2011, 04/29/2012  . Pneumococcal Polysaccharide-23 02/02/2012, 10/24/2017  . Pneumococcal-Unspecified 04/03/2010  . Tdap 04/28/2011, 01/05/2014    Current Meds  Medication Sig  . Cholecalciferol (VITAMIN D PO) Take 500 Units by mouth daily.   . Cyanocobalamin (VITAMIN B 12 PO) Take 1,000 mg by mouth daily.  . Diclofenac Sodium 3 % GEL Apply 1 application topically daily as needed (pain).   . fluticasone (FLONASE) 50 MCG/ACT nasal spray Place 2 sprays into both nostrils as needed.   . folic acid (FOLVITE) 937 MCG tablet Take 400 mcg by mouth daily. Take 2 pills in am and 2 pills at night  . gabapentin (NEURONTIN) 300 MG capsule Take 1 capsule (300 mg total) by mouth 3 (three) times daily.  Marland Kitchen glucose blood (TRUE METRIX BLOOD GLUCOSE TEST) test strip Use as instructed  . hydrochlorothiazide (HYDRODIURIL) 25 MG tablet Take 1 tablet (25 mg total) by mouth daily.  . Insulin Glargine (LANTUS SOLOSTAR) 100 UNIT/ML Solostar Pen Inject 140 Units into the skin every morning. And pen needles 1/day  . Lancets (ONETOUCH ULTRASOFT) lancets Use as instructed  . lisinopril (PRINIVIL,ZESTRIL) 20 MG tablet Take 1 tablet (20 mg total) by mouth daily.  Marland Kitchen lovastatin (MEVACOR) 20 MG tablet Take 1 tablet (20 mg total) by mouth at bedtime.  . Omega-3 Fatty Acids (FISH OIL PO) Take 360 mg by mouth. Take 2 pills at bedtime  . potassium chloride SA (K-DUR,KLOR-CON) 20 MEQ tablet Take 1 tablet (20 mEq total) by mouth 2 (two) times daily.  . QUEtiapine (SEROQUEL) 100 MG tablet Take 1 tablet (100 mg total)  by mouth at bedtime.  . SUMAtriptan (IMITREX) 25 MG tablet Take 1 tablet (25 mg total) by mouth daily. May take one more tablet two hours after the first. No more than two tablets per day. (Patient taking differently: Take 25 mg by mouth daily as needed. May take one more tablet two hours after the first. No more than two tablets per day.)  . topiramate (TOPAMAX) 50 MG tablet Take two pills at night and one pill 12 hours after daily.  . [DISCONTINUED] APPLE CIDER VINEGAR PO Take 450  mg by mouth 2 (two) times daily.  . [DISCONTINUED] busPIRone (BUSPAR) 15 MG tablet TAKE 1 TABLET(15 MG) BY MOUTH THREE TIMES DAILY    No Known Allergies  BP 140/68 (BP Location: Right Arm, Patient Position: Sitting, Cuff Size: Large)   Pulse 94   Temp 98.1 F (36.7 C) (Oral)   Ht 5\' 6"  (1.676 m)   Wt 242 lb (109.8 kg)   SpO2 99%   BMI 39.06 kg/m    Review of Systems  Constitutional: Positive for fatigue.  HENT: Negative.   Eyes: Negative.   Respiratory: Negative.   Cardiovascular: Negative.   Gastrointestinal: Positive for diarrhea.  Endocrine: Negative.   Genitourinary: Negative.   Musculoskeletal: Positive for arthralgias (Generalized).  Skin: Negative.   Allergic/Immunologic: Negative.   Neurological: Positive for dizziness and headaches.  Hematological: Negative.   Psychiatric/Behavioral: Negative.        Increased anxiety   Objective:   Physical Exam  Constitutional: She is oriented to person, place, and time. She appears well-developed and well-nourished.  HENT:  Head: Normocephalic and atraumatic.  Eyes: Pupils are equal, round, and reactive to light. Conjunctivae and EOM are normal.  Neck: Normal range of motion. Neck supple.  Cardiovascular: Normal rate, regular rhythm, normal heart sounds and intact distal pulses.  Pulmonary/Chest: Effort normal and breath sounds normal.  Abdominal: Soft. Bowel sounds are normal.  Musculoskeletal: Normal range of motion.  Neurological: She is  alert and oriented to person, place, and time.  Skin: Skin is warm and dry.  Psychiatric: She has a normal mood and affect. Her behavior is normal. Judgment and thought content normal.  Nursing note and vitals reviewed.  Assessment & Plan:   1. Type 2 diabetes mellitus with complication, with long-term current use of insulin (HCC) Improved. Hgb A1c is at 9.5 today, from 13.3 on 10/24/2017. Continue Insulin as prescribed. She will continue to decrease foods/beverages high in sugars and carbs and follow Heart Healthy or DASH diet. Increase physical activity to at least 30 minutes cardio exercise daily.  - POCT urinalysis dipstick  2. Essential hypertension Antihypertensive medications are effective. Blood pressure is 140/68 today. Continue HCTZ and Lisinopril as prescribed. She will continue to decrease high sodium intake, excessive alcohol intake, increase potassium intake, smoking cessation, and increase physical activity of at least 30 minutes of cardio activity daily. She will continue to follow Heart Healthy or DASH diet.  3. Anxiety - busPIRone (BUSPAR) 30 MG tablet; Take 1 tablet (30 mg total) by mouth 2 (two) times daily.  Dispense: 60 tablet; Refill: 6  4. Depression, unspecified depression type We will increase Buspar to 30 mg BID today.  - busPIRone (BUSPAR) 30 MG tablet; Take 1 tablet (30 mg total) by mouth 2 (two) times daily.  Dispense: 60 tablet; Refill: 6  5. Follow up He will follow up in 6 months.   Meds ordered this encounter  Medications  . busPIRone (BUSPAR) 30 MG tablet    Sig: Take 1 tablet (30 mg total) by mouth 2 (two) times daily.    Dispense:  60 tablet    Refill:  [place],  MSN, [facility] [address] [place], [place] [address] [phone]

## 2018-02-20 MED FILL — LOVASTATIN 20 MG TABLET: 20 | 90 days supply | Qty: 90 | Fill #0

## 2018-02-20 MED FILL — SUMATRIPTAN SUCC 25 MG TAB: 25 | 30 days supply | Qty: 9 | Fill #1

## 2018-02-20 MED FILL — TOPIRAMATE 50 MG TABLET: 50 | 30 days supply | Qty: 90 | Fill #1

## 2018-02-20 MED FILL — busPIRone HCL 30 MG TABS: 30 | 30 days supply | Qty: 60 | Fill #0

## 2018-02-25 ENCOUNTER — Ambulatory Visit (INDEPENDENT_AMBULATORY_CARE_PROVIDER_SITE_OTHER): Payer: Medicaid Other | Admitting: Physician Assistant

## 2018-02-26 ENCOUNTER — Telehealth: Payer: Self-pay

## 2018-02-26 NOTE — Telephone Encounter (Signed)
Referral was sent to [facility] per patient for heel swelling and pain

## 2018-03-04 MED FILL — hydrOXYzine HCL 10 MG TABS: 10 | 30 days supply | Qty: 60 | Fill #1

## 2018-03-04 MED FILL — LISINOPRIL 20 MG TAB: 20 | 30 days supply | Qty: 30 | Fill #2

## 2018-03-04 MED FILL — QUETIAPINE FUMARATE 100 MG: 100 | 30 days supply | Qty: 30 | Fill #3

## 2018-03-08 ENCOUNTER — Encounter: Payer: Self-pay | Admitting: Gastroenterology

## 2018-03-08 ENCOUNTER — Ambulatory Visit (AMBULATORY_SURGERY_CENTER): Payer: Medicaid Other | Admitting: Gastroenterology

## 2018-03-08 VITALS — BP 115/64 | HR 92 | Temp 98.0°F | Resp 18 | Ht 66.0 in | Wt 242.0 lb

## 2018-03-08 DIAGNOSIS — D123 Benign neoplasm of transverse colon: Secondary | ICD-10-CM

## 2018-03-08 DIAGNOSIS — Z1211 Encounter for screening for malignant neoplasm of colon: Secondary | ICD-10-CM | POA: Diagnosis not present

## 2018-03-08 DIAGNOSIS — D122 Benign neoplasm of ascending colon: Secondary | ICD-10-CM

## 2018-03-08 DIAGNOSIS — K635 Polyp of colon: Secondary | ICD-10-CM | POA: Diagnosis not present

## 2018-03-08 MED ORDER — SODIUM CHLORIDE 0.9 % IV SOLN: 500.0000 mL | Freq: Once | INTRAVENOUS | Status: DC

## 2018-03-08 NOTE — Progress Notes (Signed)
No problems noted in the recovery room. maw 

## 2018-03-08 NOTE — Progress Notes (Signed)
Called to room to assist during endoscopic procedure.  Patient ID and intended procedure confirmed with present staff. Received instructions for my participation in the procedure from the performing physician.  

## 2018-03-08 NOTE — Patient Instructions (Signed)
YOU HAD AN ENDOSCOPIC PROCEDURE TODAY AT [facility]:   Refer to the procedure report that was given to you for any specific questions about what was found during the examination.  If the procedure report does not answer your questions, please call your gastroenterologist to clarify.  If you requested that your care partner not be given the details of your procedure findings, then the procedure report has been included in a sealed envelope for you to review at your convenience later.  YOU SHOULD EXPECT: Some feelings of bloating in the abdomen. Passage of more gas than usual.  Walking can help get rid of the air that was put into your GI tract during the procedure and reduce the bloating. If you had a lower endoscopy (such as a colonoscopy or flexible sigmoidoscopy) you may notice spotting of blood in your stool or on the toilet paper. If you underwent a bowel prep for your procedure, you may not have a normal bowel movement for a few days.  Please Note:  You might notice some irritation and congestion in your nose or some drainage.  This is from the oxygen used during your procedure.  There is no need for concern and it should clear up in a day or so.  SYMPTOMS TO REPORT IMMEDIATELY:   Following lower endoscopy (colonoscopy or flexible sigmoidoscopy):  Excessive amounts of blood in the stool  Significant tenderness or worsening of abdominal pains  Swelling of the abdomen that is new, acute  Fever of 100F or higher    For urgent or emergent issues, a gastroenterologist can be reached at any hour by calling [phone].   DIET:  We do recommend a small meal at first, but then you may proceed to your regular diet.  Drink plenty of fluids but you should avoid alcoholic beverages for 24 hours.  ACTIVITY:  You should plan to take it easy for the rest of today and you should NOT DRIVE or use heavy machinery until tomorrow (because of the sedation medicines used during the test).     FOLLOW UP: Our staff will call the number listed on your records the next business day following your procedure to check on you and address any questions or concerns that you may have regarding the information given to you following your procedure. If we do not reach you, we will leave a message.  However, if you are feeling well and you are not experiencing any problems, there is no need to return our call.  We will assume that you have returned to your regular daily activities without incident.  If any biopsies were taken you will be contacted by phone or by letter within the next 1-3 weeks.  Please call us at [phone] if you have not heard about the biopsies in 3 weeks.    SIGNATURES/CONFIDENTIALITY: You and/or your care partner have signed paperwork which will be entered into your electronic medical record.  These signatures attest to the fact that that the information above on your After Visit Summary has been reviewed and is understood.  Full responsibility of the confidentiality of this discharge information lies with you and/or your care-partner.    Handouts were given to your care partner on polyps and diverticulosis. Your blood sugar was 139 in the recovery room. You may resume your current medications today. Await biopsy results. Please call if any questions or concerns.

## 2018-03-08 NOTE — Progress Notes (Signed)
Report to RN, VSS, adequate respirations noted, no c/o pain or discomfort 

## 2018-03-08 NOTE — Op Note (Signed)
[facility] Patient Name: Catherine Pope Procedure Date: 03/08/2018 8:49 AM MRN: [id number] Endoscopist: [place]. Loletha Carrow , MD Age: 52 Referring MD:  Date of Birth: [birth year] Gender: Female Account #: [id number] Procedure:                Colonoscopy Indications:              Screening for colorectal malignant neoplasm, This                            is the patient's first colonoscopy Medicines:                Monitored Anesthesia Care Procedure:                Pre-Anesthesia Assessment:                           - Prior to the procedure, a History and Physical                            was performed, and patient medications and                            allergies were reviewed. The patient's tolerance of                            previous anesthesia was also reviewed. The risks                            and benefits of the procedure and the sedation                            options and risks were discussed with the patient.                            All questions were answered, and informed consent                            was obtained. Prior Anticoagulants: The patient has                            taken no previous anticoagulant or antiplatelet                            agents. ASA Grade Assessment: II - A patient with                            mild systemic disease. After reviewing the risks                            and benefits, the patient was deemed in                            satisfactory condition to undergo the procedure.  After obtaining informed consent, the colonoscope                            was passed under direct vision. Throughout the                            procedure, the patient's blood pressure, pulse, and                            oxygen saturations were monitored continuously. The                            Colonoscope was introduced through the anus and                            advanced to the the cecum,  identified by                            appendiceal orifice and ileocecal valve. The                            colonoscopy was performed without difficulty. The                            patient tolerated the procedure well. The quality                            of the bowel preparation was good. The ileocecal                            valve, appendiceal orifice, and rectum were                            photographed. Scope In: 8:55:39 AM Scope Out: 9:19:49 AM Scope Withdrawal Time: 0 hours 18 minutes 1 second  Total Procedure Duration: 0 hours 24 minutes 10 seconds  Findings:                 The perianal and digital rectal examinations were                            normal.                           Multiple diverticula were found in the entire colon.                           A 2 mm polyp was found in the proximal ascending                            colon. The polyp was sessile. The polyp was removed                            with a cold biopsy forceps. Resection and retrieval  were complete.                           A 4 mm polyp was found in the proximal ascending                            colon. The polyp was sessile. The polyp was removed                            with a cold snare. Resection was complete, but the                            polyp tissue was not retrieved.                           Two sessile polyps were found in the transverse                            colon. The polyps were 4 mm in size. These polyps                            were removed with a cold snare. Resection and                            retrieval were complete.                           The exam was otherwise without abnormality on                            direct and retroflexion views. Complications:            No immediate complications. Estimated Blood Loss:     Estimated blood loss was minimal. Impression:               - Diverticulosis in the entire  examined colon.                           - One 2 mm polyp in the proximal ascending colon,                            removed with a cold biopsy forceps. Resected and                            retrieved.                           - One 4 mm polyp in the proximal ascending colon,                            removed with a cold snare. Complete resection.                            Polyp tissue not retrieved.                           -  Two 4 mm polyps in the transverse colon, removed                            with a cold snare. Resected and retrieved.                           - The examination was otherwise normal on direct                            and retroflexion views. Recommendation:           - Patient has a contact number available for                            emergencies. The signs and symptoms of potential                            delayed complications were discussed with the                            patient. Return to normal activities tomorrow.                            Written discharge instructions were provided to the                            patient.                           - Resume previous diet.                           - Continue present medications.                           - Await pathology results.                           - Repeat colonoscopy is recommended for                            surveillance. The colonoscopy date will be                            determined after pathology results from today's                            exam become available for review. Irja Wheless L. Loletha Carrow, MD 03/08/2018 9:24:24 AM This report has been signed electronically.

## 2018-03-11 ENCOUNTER — Encounter: Payer: Self-pay | Admitting: Endocrinology

## 2018-03-11 ENCOUNTER — Ambulatory Visit (INDEPENDENT_AMBULATORY_CARE_PROVIDER_SITE_OTHER): Payer: Medicaid Other | Admitting: Endocrinology

## 2018-03-11 ENCOUNTER — Telehealth: Payer: Self-pay

## 2018-03-11 VITALS — BP 148/78 | HR 105 | Ht 66.0 in | Wt 242.4 lb

## 2018-03-11 DIAGNOSIS — Z794 Long term (current) use of insulin: Secondary | ICD-10-CM

## 2018-03-11 DIAGNOSIS — E1122 Type 2 diabetes mellitus with diabetic chronic kidney disease: Secondary | ICD-10-CM

## 2018-03-11 DIAGNOSIS — N184 Chronic kidney disease, stage 4 (severe): Secondary | ICD-10-CM

## 2018-03-11 NOTE — Telephone Encounter (Signed)
  Follow up Call-  Call back number 03/08/2018  Post procedure Call Back phone  # [phone]  Permission to leave phone message Yes  Some recent data might be hidden     Patient questions:  Do you have a fever, pain , or abdominal swelling? No. Pain Score  0 *  Have you tolerated food without any problems? Yes.    Have you been able to return to your normal activities? Yes.    Do you have any questions about your discharge instructions: Diet   No. Medications  No. Follow up visit  No.  Do you have questions or concerns about your Care? No.  Actions: * If pain score is 4 or above: No action needed, pain <4.

## 2018-03-11 NOTE — Progress Notes (Signed)
Subjective:    Patient ID: Catherine Pope, female    DOB: 1966/02/11, 52 y.o.   MRN: [id number]  HPI DM type: Insulin-requiring type 2 Dx'ed: 9449 Complications: polyneuropathy Therapy: insulin since 2018 GDM: never DKA: never Severe hypoglycemia: never Pancreatitis: never Pancreatic imaging: normal on 2005 CT.   Other: she takes qd insulin, after poor results with multiple daily injections; She gets meds at [facility].  Interval history: no cbg record, but states cbg's vary from 80-250.  It is in general higher as the day goes on  Pt says she never misses the insulin.  pt states she feels well in general.   Past Medical History:  Diagnosis Date  . Anxiety   . Depression   . Diabetes mellitus    dx back in 2010  . History of claustrophobia   . 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 Hypertension   . Migraine   . Sinusitis     Past Surgical History:  Procedure Laterality Date  . ABDOMINAL HYSTERECTOMY     per pt , in her 20's  . ANTERIOR CERVICAL DECOMP/DISCECTOMY FUSION N/A 12/27/2016   Procedure: CERVICAL SIX-SEVEN ANTERIOR CERVICAL DECOMPRESSION/DISCECTOMY FUSION;  Surgeon: Eustace Moore, MD;  Location: [facility];  Service: Neurosurgery;  Laterality: N/A;  . ANTERIOR CRUCIATE LIGAMENT REPAIR     right knee  . BREAST EXCISIONAL BIOPSY Left over 20 years ago   benign  . BREAST SURGERY     2 lumps removed at age 78, left breast/ benign  . RADIOLOGY WITH ANESTHESIA Left 12/11/2017   Procedure: MRI cervical spine and left shoulder WITH ANESTHESIA;  Surgeon: Radiologist, Medication, MD;  Location: [facility];  Service: Radiology;  Laterality: Left;  . SHOULDER ARTHROSCOPY     right shoulder    Social History   Socioeconomic History  . Marital status: Single    Spouse name: Not on file  . Number of children: 2  . Years of education: 69 th  . Highest education level: Not on file  Occupational History  . Occupation:  unemployed  Social Needs  . Financial resource strain: Not on file  . Food insecurity:    Worry: Sometimes true    Inability: Sometimes true  . Transportation needs:    Medical: Not on file    Non-medical: Not on file  Tobacco Use  . Smoking status: Current Every Day Smoker    Packs/day: 0.50    Years: 15.00    Pack years: 7.50    Types: Cigarettes  . Smokeless tobacco: Never Used  . Tobacco comment: less than half pack a day.  Substance and Sexual Activity  . Alcohol use: No  . Drug use: No  . Sexual activity: Not Currently  Lifestyle  . Physical activity:    Days per week: Not on file    Minutes per session: Not on file  . Stress: Not on file  Relationships  . Social connections:    Talks on phone: Not on file    Gets together: Not on file    Attends religious service: Not on file    Active member of club or organization: Not on file    Attends meetings of clubs or organizations: Not on file    Relationship status: Not on file  . Intimate partner violence:    Fear of current or ex partner: Not on file    Emotionally abused: Not on file  Physically abused: Not on file    Forced sexual activity: Not on file  Other Topics Concern  . Not on file  Social History Narrative   Patient lives at home with her daughters and she is unemployed. Patient has two children.   Patient has a high school education.   Right handed.    Caffeine two cups daily.    Current Outpatient Medications on File Prior to Visit  Medication Sig Dispense Refill  . busPIRone (BUSPAR) 30 MG tablet Take 1 tablet (30 mg total) by mouth 2 (two) times daily. 60 tablet 6  . Cholecalciferol (VITAMIN D PO) Take 500 Units by mouth daily.     . Cyanocobalamin (VITAMIN B 12 PO) Take 1,000 mg by mouth daily.    . fluticasone (FLONASE) 50 MCG/ACT nasal spray Place 2 sprays into both nostrils as needed.   1  . folic acid (FOLVITE) 595 MCG tablet Take 400 mcg by mouth daily. Take 2 pills in am and 2 pills at  night    . gabapentin (NEURONTIN) 300 MG capsule Take 1 capsule (300 mg total) by mouth 3 (three) times daily. 90 capsule 5  . glucose blood (TRUE METRIX BLOOD GLUCOSE TEST) test strip Use as instructed 100 each 12  . hydrochlorothiazide (HYDRODIURIL) 25 MG tablet Take 1 tablet (25 mg total) by mouth daily. 90 tablet 3  . hydrOXYzine (ATARAX/VISTARIL) 10 MG tablet Take 1 tablet (10 mg total) by mouth 2 (two) times daily as needed. 60 tablet 2  . imiquimod (ALDARA) 5 % cream Apply topically 3 (three) times a week. 12 each 3  . Insulin Glargine (LANTUS SOLOSTAR) 100 UNIT/ML Solostar Pen Inject 140 Units into the skin every morning. And pen needles 1/day 15 mL 11  . Lancets (ONETOUCH ULTRASOFT) lancets Use as instructed 100 each 12  . lisinopril (PRINIVIL,ZESTRIL) 20 MG tablet Take 1 tablet (20 mg total) by mouth daily. 30 tablet 5  . lovastatin (MEVACOR) 20 MG tablet Take 1 tablet (20 mg total) by mouth at bedtime. 90 tablet 3  . Omega-3 Fatty Acids (FISH OIL PO) Take 360 mg by mouth. Take 2 pills at bedtime    . QUEtiapine (SEROQUEL) 100 MG tablet Take 1 tablet (100 mg total) by mouth at bedtime. 30 tablet 4  . SUMAtriptan (IMITREX) 25 MG tablet Take 1 tablet (25 mg total) by mouth daily. May take one more tablet two hours after the first. No more than two tablets per day. (Patient taking differently: Take 25 mg by mouth daily as needed. May take one more tablet two hours after the first. No more than two tablets per day.) 30 tablet 0  . topiramate (TOPAMAX) 50 MG tablet Take two pills at night and one pill 12 hours after daily. 90 tablet 1  . albuterol (PROVENTIL HFA;VENTOLIN HFA) 108 (90 Base) MCG/ACT inhaler Inhale 1-2 puffs into the lungs every 6 (six) hours as needed for wheezing or shortness of breath. (Patient not taking: Reported on 02/14/2018) 1 Inhaler 0  . Diclofenac Sodium 3 % GEL Apply 1 application topically daily as needed (pain).   1  . potassium chloride SA (K-DUR,KLOR-CON) 20 MEQ  tablet Take 1 tablet (20 mEq total) by mouth 2 (two) times daily. (Patient not taking: Reported on 03/11/2018) 60 tablet 0   No current facility-administered medications on file prior to visit.     No Known Allergies  Family History  Problem Relation Age of Onset  . Heart Problems Father   .  Diabetes Father   . Hypertension Other   . Heart disease Other   . Stroke Sister   . Kidney disease Brother     BP (!) 148/78 (BP Location: Left Arm, Patient Position: Sitting, Cuff Size: Normal)   Pulse (!) 105   Ht 5\' 6"  (1.676 m)   Wt 242 lb 6.4 oz (110 kg)   SpO2 98%   BMI 39.12 kg/m    Review of Systems She denies hypoglycemia.      Objective:   Physical Exam VITAL SIGNS:  See vs page GENERAL: no distress Pulses: dorsalis pedis intact bilat.   MSK: no deformity of the feet CV: no leg edema Skin:  no ulcer on the feet, but there is a heavy callus at the right heel.  normal color and temp on the feet. Neuro: sensation is intact to touch on the feet, but decreased from normal   Lab Results  Component Value Date   CREATININE 0.78 01/28/2018   BUN 5 (L) 01/28/2018   NA 142 01/28/2018   K 3.1 (L) 01/28/2018   CL 103 01/28/2018   CO2 22 01/28/2018   Lab Results  Component Value Date   HGBA1C 9.5 (A) 02/14/2018       Assessment & Plan:  Insulin-requiring type 2 DM, with PN: she needs increased rx HTN: is noted today  Patient Instructions  Your blood pressure is high today.  Please see your primary care provider soon, to have it rechecked check your blood sugar twice a day.  vary the time of day when you check, between before the 3 meals, and at bedtime.  also check if you have symptoms of your blood sugar being too high or too low.  please keep a record of the readings and bring it to your next appointment here (or you can bring the meter itself).  You can write it on any piece of paper.  please call us sooner if your blood sugar goes below 70, or if you have a lot of  readings over 200.    Please continue the same insulin for now. Please come back for a follow-up appointment in 1 month.  If the a1c is still high, we may need to change to a faster-acting insulin.

## 2018-03-11 NOTE — Patient Instructions (Addendum)
Your blood pressure is high today.  Please see your primary care provider soon, to have it rechecked check your blood sugar twice a day.  vary the time of day when you check, between before the 3 meals, and at bedtime.  also check if you have symptoms of your blood sugar being too high or too low.  please keep a record of the readings and bring it to your next appointment here (or you can bring the meter itself).  You can write it on any piece of paper.  please call us sooner if your blood sugar goes below 70, or if you have a lot of readings over 200.    Please continue the same insulin for now. Please come back for a follow-up appointment in 1 month.  If the a1c is still high, we may need to change to a faster-acting insulin.

## 2018-03-14 ENCOUNTER — Ambulatory Visit (HOSPITAL_COMMUNITY): Payer: Medicaid Other | Admitting: Psychiatry

## 2018-03-14 ENCOUNTER — Encounter: Payer: Self-pay | Admitting: Gastroenterology

## 2018-03-15 MED FILL — LANTUS SOLOSTAR 100 UNITS/M: 100 | 21 days supply | Qty: 30 | Fill #1

## 2018-03-15 MED FILL — IMIQUIMOD 5 % CREA: 5 | 4 days supply | Qty: 12 | Fill #3

## 2018-03-21 ENCOUNTER — Encounter (HOSPITAL_COMMUNITY): Payer: Self-pay | Admitting: Psychiatry

## 2018-03-21 ENCOUNTER — Ambulatory Visit (INDEPENDENT_AMBULATORY_CARE_PROVIDER_SITE_OTHER): Payer: Medicaid Other | Admitting: Psychiatry

## 2018-03-21 VITALS — BP 142/80 | Ht 66.0 in | Wt 235.0 lb

## 2018-03-21 DIAGNOSIS — F411 Generalized anxiety disorder: Secondary | ICD-10-CM | POA: Diagnosis not present

## 2018-03-21 DIAGNOSIS — F331 Major depressive disorder, recurrent, moderate: Secondary | ICD-10-CM

## 2018-03-21 DIAGNOSIS — F418 Other specified anxiety disorders: Secondary | ICD-10-CM | POA: Diagnosis not present

## 2018-03-21 MED ORDER — DULOXETINE HCL 20 MG PO CPEP: ORAL_CAPSULE | ORAL | 1 refills | Status: DC

## 2018-03-21 MED ORDER — TRAZODONE HCL 100 MG PO TABS: ORAL_TABLET | ORAL | 1 refills | Status: DC

## 2018-03-21 MED ORDER — HYDROXYZINE HCL 25 MG PO TABS: 25.0000 mg | ORAL_TABLET | Freq: Two times a day (BID) | ORAL | 1 refills | Status: DC | PRN

## 2018-03-21 MED FILL — traZODone HCL 100 MG TABS: 100 | 30 days supply | Qty: 30 | Fill #0

## 2018-03-21 MED FILL — DULoxetine HCL 20 MG CPEP: 20 | 33 days supply | Qty: 60 | Fill #0

## 2018-03-21 NOTE — Progress Notes (Signed)
Psychiatric Initial Adult Assessment   Patient Identification: Catherine Pope MRN:  [id number] Date of Evaluation:  03/21/2018 Referral Source: Primary care physician.  Chief Complaint:  I had a lot of anxiety and depression.  Visit Diagnosis:    ICD-10-CM   1. GAD (generalized anxiety disorder) F41.1 hydrOXYzine (ATARAX/VISTARIL) 25 MG tablet    traZODone (DESYREL) 100 MG tablet  2. Depression with anxiety F41.8   3. MDD (major depressive disorder), recurrent episode, moderate (HCC) F33.1 DULoxetine (CYMBALTA) 20 MG capsule    History of Present Illness: Catherine Pope is a 52 year old African-American, single, unemployed female who is referred from her primary care physician for the management of depression and anxiety symptoms.  She suffers from anxiety and depression for more than 5 years but lately her symptoms started to get worse.  She told irritability, frustration, mood swing, nervousness and feeling overwhelmed.  She adopted 3 children few years ago and 26-year-old boy has a lot of behavior problems.  He does not listen and does not follow the rules.  He is not doing good at school and she is worried about him.  Patient did not get any support or help from her family member and she feels that they have abandoned her.  Patient lives with 3 adopted children, her own 82 year old daughter who has 51-year-old child and recently her nephew also moved in with his son.  Patient told there are too many people in the house and sometimes gets overwhelmed and she get very frustrated.  She admitted yelling and cursing but denies any aggression or violence.  Patient admitted sometimes feeling worthless and hopeless but denies any suicidal thoughts or homicidal thought.  She denies any paranoia, hallucination, nightmares, flashback.  She endorsed lack of motivation to do things.  She feels tired and fatigued.  Patient has multiple health issues including migraine headache, essential hypertension, chronic  back pain, neuropathy, diabetes mellitus, weight gain and hyperlipidemia.  Her physician is trying to manage her blood sugar under control.  She used to have hemoglobin A1c more than 13 but last test was much better and came out 9.5.  She admitted not doing exercise and gained weight in recent years.  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  Patient endorsed poor sleep, racing thoughts and irritability.  She is taking hydroxyzine 10 mg twice a day for anxiety, Seroquel 100 mg at bedtime.  Patient has seen [facility] in the past and prescribed Wellbutrin, Seroquel but she has been not taking Wellbutrin for a while.  Patient is willing to try medication adjustment.  Currently she is not seeing any therapist but willing to see a counselor in the future.   Associated Signs/Symptoms: Depression Symptoms:  depressed mood, anhedonia, insomnia, fatigue, feelings of worthlessness/guilt, difficulty concentrating, hopelessness, anxiety, loss of energy/fatigue, disturbed sleep, weight gain, (Hypo) Manic Symptoms:  Distractibility, Irritable Mood, Anxiety Symptoms:  Excessive Worry, Psychotic Symptoms:  No psychotic symptoms. PTSD Symptoms: Negative  Past Psychiatric History: No history of psychiatric inpatient treatment or any suicidal attempt.  Seen at [facility] in the past and prescribed quetiapine, Effexor and Wellbutrin.  Effexor cause fogginess and drowsiness.  Took Wellbutrin for a while and then ran out.  Did not see any improvement with Wellbutrin.  No history of mania, psychosis or any hallucination.  Previous Psychotropic Medications: Yes   Substance Abuse History in the last 12 months:  No.  Consequences of Substance Abuse: Negative  Past Medical History:  Past Medical History:  Diagnosis Date  . Anxiety   . Depression   . Diabetes mellitus    dx back in 2010  . History of claustrophobia   . 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 Hypertension   . Migraine   . Sinusitis     Past Surgical History:  Procedure Laterality Date  . ABDOMINAL HYSTERECTOMY     per pt , in her 20's  . ANTERIOR CERVICAL DECOMP/DISCECTOMY FUSION N/A 12/27/2016   Procedure: CERVICAL SIX-SEVEN ANTERIOR CERVICAL DECOMPRESSION/DISCECTOMY FUSION;  Surgeon: Eustace Moore, MD;  Location: [facility];  Service: Neurosurgery;  Laterality: N/A;  . ANTERIOR CRUCIATE LIGAMENT REPAIR     right knee  . BREAST EXCISIONAL BIOPSY Left over 20 years ago   benign  . BREAST SURGERY     2 lumps removed at age 43, left breast/ benign  . RADIOLOGY WITH ANESTHESIA Left 12/11/2017   Procedure: MRI cervical spine and left shoulder WITH ANESTHESIA;  Surgeon: Radiologist, Medication, MD;  Location: [facility];  Service: Radiology;  Laterality: Left;  . SHOULDER ARTHROSCOPY     right shoulder    Family Psychiatric History: Reviewed.  Family History:  Family History  Problem Relation Age of Onset  . Heart Problems Father   . Diabetes Father   . Hypertension Other   . Heart disease Other   . Stroke Sister   . Kidney disease Brother     Social History:   Social History   Socioeconomic History  . Marital status: Single    Spouse name: Not on file  . Number of children: 5  . Years of education: 62 th  . Highest education level: Not on file  Occupational History  . Occupation: unemployed  Social Needs  . Financial resource strain: Very hard  . Food insecurity:    Worry: Sometimes true    Inability: Sometimes true  . Transportation needs:    Medical: No    Non-medical: No  Tobacco Use  . Smoking status: Current Every Day Smoker    Packs/day: 0.50    Years: 15.00    Pack years: 7.50    Types: Cigarettes  . Smokeless tobacco: Never Used  . Tobacco comment: less than half pack a day.  Substance and Sexual Activity  . Alcohol use: No  . Drug use: No  . Sexual activity:  Not Currently  Lifestyle  . Physical activity:    Days per week: 0 days    Minutes per session: 0 min  . Stress: Very much  Relationships  . Social connections:    Talks on phone: More than three times a week    Gets together: Never    Attends religious service: Never    Active member of club or organization: No    Attends meetings of clubs or organizations: Never    Relationship status: Never married  Other Topics Concern  . Not on file  Social History Narrative   Patient lives at home with her daughters and she is unemployed. Patient has two children.   Patient has a high school education.   Right handed.    Caffeine two cups daily.    Additional Social History: Patient born and raised in [place].  Patient never married.  She has 2 daughter who are 73 and 69 years old.  She left her children's father because he did not work and  never been supportive.  Patient's father died in 36.  She adopted 3 children.  She quit working in 2015.  She has extensive family including her mother, brother and sister but no one helped her.  Patient lives with 3 adopted children, 64 year old daughter with her 90-year-old child, her nephew and his son.  Allergies:  No Known Allergies  Metabolic Disorder Labs: Recent Results (from the past 2160 hour(s))  CBC with Differential     Status: Abnormal   Collection Time: 01/28/18 10:17 AM  Result Value Ref Range   WBC 9.1 3.4 - 10.8 x10E3/uL   RBC 4.62 3.77 - 5.28 x10E6/uL   Hemoglobin 13.6 11.1 - 15.9 g/dL   Hematocrit 40.5 34.0 - 46.6 %   MCV 88 79 - 97 fL   MCH 29.4 26.6 - 33.0 pg   MCHC 33.6 31.5 - 35.7 g/dL   RDW 13.7 12.3 - 15.4 %   Platelets 280 150 - 450 x10E3/uL   Neutrophils 56 Not Estab. %   Lymphs 36 Not Estab. %   Monocytes 7 Not Estab. %   Eos 1 Not Estab. %   Basos 0 Not Estab. %   Neutrophils Absolute 5.1 1.4 - 7.0 x10E3/uL   Lymphocytes Absolute 3.3 (H) 0.7 - 3.1 x10E3/uL   Monocytes Absolute 0.7 0.1 - 0.9 x10E3/uL   EOS  (ABSOLUTE) 0.1 0.0 - 0.4 x10E3/uL   Basophils Absolute 0.0 0.0 - 0.2 x10E3/uL   Immature Granulocytes 0 Not Estab. %   Immature Grans (Abs) 0.0 0.0 - 0.1 x10E3/uL  Comprehensive metabolic panel     Status: Abnormal   Collection Time: 01/28/18 10:17 AM  Result Value Ref Range   Glucose 121 (H) 65 - 99 mg/dL   BUN 5 (L) 6 - 24 mg/dL   Creatinine, Ser 0.78 0.57 - 1.00 mg/dL   GFR calc non Af Amer 89 >59 mL/min/1.73   GFR calc Af Amer 103 >59 mL/min/1.73   BUN/Creatinine Ratio 6 (L) 9 - 23   Sodium 142 134 - 144 mmol/L   Potassium 3.1 (L) 3.5 - 5.2 mmol/L   Chloride 103 96 - 106 mmol/L   CO2 22 20 - 29 mmol/L   Calcium 9.4 8.7 - 10.2 mg/dL   Total Protein 7.3 6.0 - 8.5 g/dL   Albumin 4.3 3.5 - 5.5 g/dL   Globulin, Total 3.0 1.5 - 4.5 g/dL   Albumin/Globulin Ratio 1.4 1.2 - 2.2   Bilirubin Total 0.3 0.0 - 1.2 mg/dL   Alkaline Phosphatase 72 39 - 117 IU/L   AST 37 0 - 40 IU/L   ALT 31 0 - 32 IU/L  TSH     Status: None   Collection Time: 01/28/18 10:17 AM  Result Value Ref Range   TSH 2.450 0.450 - 4.500 uIU/mL  POCT HgB A1C     Status: Abnormal   Collection Time: 02/14/18 10:23 AM  Result Value Ref Range   Hemoglobin A1C 9.5 (A) 4.0 - 5.6 %   HbA1c POC (<> result, manual entry)     HbA1c, POC (prediabetic range)     HbA1c, POC (controlled diabetic range)    POCT urinalysis dipstick     Status: Abnormal   Collection Time: 02/19/18 11:05 AM  Result Value Ref Range   Color, UA yellow yellow   Clarity, UA clear clear   Glucose, UA negative negative mg/dL   Bilirubin, UA negative negative   Ketones, POC UA negative negative mg/dL   Spec Grav, UA <=1.005 (A)  1.010 - 1.025   Blood, UA negative negative   pH, UA 6.0 5.0 - 8.0   Protein Ur, POC negative negative mg/dL   Urobilinogen, UA 0.2 0.2 or 1.0 E.U./dL   Nitrite, UA Negative Negative   Leukocytes, UA Negative Negative   Lab Results  Component Value Date   HGBA1C 9.5 (A) 02/14/2018   MPG 309.18 12/18/2016   MPG 217  08/20/2015   No results found for: PROLACTIN Lab Results  Component Value Date   CHOL 143 08/02/2017   TRIG 112 08/02/2017   HDL 34 (L) 08/02/2017   CHOLHDL 4.2 08/02/2017   VLDL 21 07/25/2016   LDLCALC 87 08/02/2017   LDLCALC 99 07/25/2016   Lab Results  Component Value Date   TSH 2.450 01/28/2018    Therapeutic Level Labs: No results found for: LITHIUM No results found for: CBMZ No results found for: VALPROATE  Current Medications: Current Outpatient Medications  Medication Sig Dispense Refill  . albuterol (PROVENTIL HFA;VENTOLIN HFA) 108 (90 Base) MCG/ACT inhaler Inhale 1-2 puffs into the lungs every 6 (six) hours as needed for wheezing or shortness of breath. 1 Inhaler 0  . busPIRone (BUSPAR) 30 MG tablet Take 1 tablet (30 mg total) by mouth 2 (two) times daily. 60 tablet 6  . Cholecalciferol (VITAMIN D PO) Take 500 Units by mouth daily.     . Cyanocobalamin (VITAMIN B 12 PO) Take 1,000 mg by mouth daily.    . Diclofenac Sodium 3 % GEL Apply 1 application topically daily as needed (pain).   1  . fluticasone (FLONASE) 50 MCG/ACT nasal spray Place 2 sprays into both nostrils as needed.   1  . folic acid (FOLVITE) 885 MCG tablet Take 400 mcg by mouth daily. Take 2 pills in am and 2 pills at night    . gabapentin (NEURONTIN) 300 MG capsule Take 1 capsule (300 mg total) by mouth 3 (three) times daily. 90 capsule 5  . glucose blood (TRUE METRIX BLOOD GLUCOSE TEST) test strip Use as instructed 100 each 12  . hydrochlorothiazide (HYDRODIURIL) 25 MG tablet Take 1 tablet (25 mg total) by mouth daily. 90 tablet 3  . hydrOXYzine (ATARAX/VISTARIL) 10 MG tablet Take 1 tablet (10 mg total) by mouth 2 (two) times daily as needed. 60 tablet 2  . Insulin Glargine (LANTUS SOLOSTAR) 100 UNIT/ML Solostar Pen Inject 140 Units into the skin every morning. And pen needles 1/day 15 mL 11  . Lancets (ONETOUCH ULTRASOFT) lancets Use as instructed 100 each 12  . lisinopril (PRINIVIL,ZESTRIL) 20 MG  tablet Take 1 tablet (20 mg total) by mouth daily. 30 tablet 5  . lovastatin (MEVACOR) 20 MG tablet Take 1 tablet (20 mg total) by mouth at bedtime. 90 tablet 3  . Omega-3 Fatty Acids (FISH OIL PO) Take 360 mg by mouth. Take 2 pills at bedtime    . QUEtiapine (SEROQUEL) 100 MG tablet Take 1 tablet (100 mg total) by mouth at bedtime. 30 tablet 4  . SUMAtriptan (IMITREX) 25 MG tablet Take 1 tablet (25 mg total) by mouth daily. May take one more tablet two hours after the first. No more than two tablets per day. (Patient taking differently: Take 25 mg by mouth daily as needed. May take one more tablet two hours after the first. No more than two tablets per day.) 30 tablet 0  . imiquimod (ALDARA) 5 % cream Apply topically 3 (three) times a week. (Patient not taking: Reported on 03/21/2018) 12 each 3  No current facility-administered medications for this visit.     Musculoskeletal: Strength & Muscle Tone: decreased Gait & Station: Difficulty walking due to foot pain. Patient leans: Right and Has boot on his right foot  Psychiatric Specialty Exam: Review of Systems  Constitutional: Positive for malaise/fatigue.  Musculoskeletal: Positive for back pain and joint pain.  Skin: Negative.   Neurological: Positive for tingling and headaches.  Psychiatric/Behavioral: Positive for depression. The patient is nervous/anxious and has insomnia.     Blood pressure (!) 142/80, height 5\' 6"  (1.676 m), weight 235 lb (106.6 kg).Body mass index is 37.93 kg/m.  General Appearance: Casual  Eye Contact:  Fair  Speech:  Slow  Volume:  Decreased  Mood:  Anxious, Depressed and Dysphoric  Affect:  Constricted and Depressed  Thought Process:  Goal Directed  Orientation:  Full (Time, Place, and Person)  Thought Content:  Rumination  Suicidal Thoughts:  No  Homicidal Thoughts:  No  Memory:  Immediate;   Fair Recent;   Fair Remote;   Good  Judgement:  Good  Insight:  Present  Psychomotor Activity:  Decreased   Concentration:  Concentration: Fair and Attention Span: Fair  Recall:  Good  Fund of Knowledge:Good  Language: Good  Akathisia:  No  Handed:  Right  AIMS (if indicated):  not done  Assets:  Communication Skills Desire for Improvement Housing  ADL's:  Intact  Cognition: WNL  Sleep:  Fair   Screenings: GAD-7     Office Visit from 01/28/2018 in [facility] Office Visit from 11/21/2017 in [facility] Office Visit from 11/01/2017 in [facility] Office Visit from 10/24/2017 in [facility] Office Visit from 09/12/2017 in [facility]  Total GAD-7 Score  10  14  8  4  5     PHQ2-9     Office Visit from 02/19/2018 in [facility] Office Visit from 01/28/2018 in [facility] Office Visit from 11/21/2017 in [facility] Office Visit from 11/01/2017 in [facility] Office Visit from 10/24/2017 in [facility]  PHQ-2 Total Score  1  2  3  2   0  PHQ-9 Total Score  -  10  20  7   0      Assessment and Plan: Major depressive disorder, recurrent.  Generalized anxiety disorder.  I reviewed her notes, psychosocial stressors, current lab and recent medication.  She is taking Seroquel 100 mg at bedtime.  It is unclear why she is taking Seroquel since she has no history of mania and psychosis.  I talked that Seroquel may cause worsening of blood sugar and weight gain.  She agreed to discontinue Seroquel.  We will try trazodone 50 mg to 100 mg for the.  I also recommended to increase hydroxyzine from 10 mg to 25 mg twice a day to help with anxiety.  Given the history of diabetes and neuropathy I recommended to try Cymbalta 20 mg daily for 1 week and then 40 mg every day.  Discussed medication side effects especially in the beginning can cause tremors shakes and headaches.  I do believe she should see a  therapist for coping skills.  She has a lot of stress from her living situation.  We will schedule appointment to see a therapist in this office.  Discussed safety concerns at any time having active suicidal thoughts or homicidal  thought that she need to call 911 or go to local emergency room.  Follow-up in 4 to 6 weeks.    Kathlee Nations, MD 12/19/201911:27 AM

## 2018-03-21 NOTE — Patient Instructions (Signed)
Take 1/2 Quetiapine for 1 week and than stop.

## 2018-03-28 ENCOUNTER — Other Ambulatory Visit: Payer: Self-pay | Admitting: Internal Medicine

## 2018-03-28 MED FILL — hydrOXYzine HCL 25 MG TABS: 25 | 30 days supply | Qty: 60 | Fill #0

## 2018-03-29 MED FILL — busPIRone HCL 30 MG TABS: 30 | 30 days supply | Qty: 60 | Fill #1

## 2018-03-29 MED FILL — hydrOXYzine HCL 25 MG TABS: 25 | 30 days supply | Qty: 60 | Fill #1

## 2018-04-01 ENCOUNTER — Other Ambulatory Visit: Payer: Self-pay | Admitting: Family Medicine

## 2018-04-01 ENCOUNTER — Telehealth: Payer: Self-pay

## 2018-04-01 ENCOUNTER — Other Ambulatory Visit: Payer: Self-pay

## 2018-04-01 DIAGNOSIS — M545 Low back pain, unspecified: Secondary | ICD-10-CM

## 2018-04-01 DIAGNOSIS — Z760 Encounter for issue of repeat prescription: Secondary | ICD-10-CM

## 2018-04-01 MED ORDER — CYCLOBENZAPRINE HCL 10 MG PO TABS: 10.0000 mg | ORAL_TABLET | Freq: Three times a day (TID) | ORAL | 0 refills | Status: DC | PRN

## 2018-04-01 NOTE — Telephone Encounter (Signed)
Patient would like to have a another script for Cyclobenzaprine for sciatica pain.

## 2018-04-02 ENCOUNTER — Other Ambulatory Visit (INDEPENDENT_AMBULATORY_CARE_PROVIDER_SITE_OTHER): Payer: Self-pay | Admitting: Physician Assistant

## 2018-04-02 DIAGNOSIS — R51 Headache: Principal | ICD-10-CM

## 2018-04-02 DIAGNOSIS — R519 Headache, unspecified: Secondary | ICD-10-CM

## 2018-04-02 MED FILL — LISINOPRIL 20 MG TAB: 20 | 30 days supply | Qty: 30 | Fill #3

## 2018-04-02 MED FILL — LANTUS SOLOSTAR 100 UNITS/M: 100 | 21 days supply | Qty: 30 | Fill #2

## 2018-04-02 MED FILL — SUMATRIPTAN SUCC 25 MG TAB: 25 | 30 days supply | Qty: 9 | Fill #2

## 2018-04-02 MED FILL — QUETIAPINE FUMARATE 100 MG: 100 | 30 days supply | Qty: 30 | Fill #4

## 2018-04-02 MED FILL — CYCLOBENZAPRINE 10 MG TAB: 10 | 10 days supply | Qty: 30 | Fill #0

## 2018-04-02 NOTE — Telephone Encounter (Signed)
FWD to PCP. Halford Goetzke S Jeanetta Alonzo, CMA  

## 2018-04-02 NOTE — Telephone Encounter (Signed)
Patient notified

## 2018-04-05 ENCOUNTER — Encounter: Payer: Self-pay | Admitting: Family Medicine

## 2018-04-05 ENCOUNTER — Ambulatory Visit (INDEPENDENT_AMBULATORY_CARE_PROVIDER_SITE_OTHER): Payer: Medicaid Other | Admitting: Family Medicine

## 2018-04-05 VITALS — BP 124/62 | HR 92 | Temp 98.0°F | Ht 66.0 in | Wt 241.0 lb

## 2018-04-05 DIAGNOSIS — Z09 Encounter for follow-up examination after completed treatment for conditions other than malignant neoplasm: Secondary | ICD-10-CM

## 2018-04-05 DIAGNOSIS — E1122 Type 2 diabetes mellitus with diabetic chronic kidney disease: Secondary | ICD-10-CM

## 2018-04-05 DIAGNOSIS — Z6838 Body mass index (BMI) 38.0-38.9, adult: Secondary | ICD-10-CM

## 2018-04-05 DIAGNOSIS — M545 Low back pain, unspecified: Secondary | ICD-10-CM

## 2018-04-05 DIAGNOSIS — Z794 Long term (current) use of insulin: Secondary | ICD-10-CM

## 2018-04-05 DIAGNOSIS — E66812 Obesity, class 2: Secondary | ICD-10-CM

## 2018-04-05 DIAGNOSIS — G894 Chronic pain syndrome: Secondary | ICD-10-CM | POA: Diagnosis not present

## 2018-04-05 DIAGNOSIS — N184 Chronic kidney disease, stage 4 (severe): Secondary | ICD-10-CM

## 2018-04-05 MED ORDER — MELOXICAM 15 MG PO TABS: 15.0000 mg | ORAL_TABLET | Freq: Every day | ORAL | 3 refills | Status: DC

## 2018-04-05 NOTE — Patient Instructions (Addendum)
Meloxicam tablets What is this medicine? MELOXICAM (mel OX i cam) is a non-steroidal anti-inflammatory drug (NSAID). It is used to reduce swelling and to treat pain. It may be used for osteoarthritis, rheumatoid arthritis, or juvenile rheumatoid arthritis. This medicine may be used for other purposes; ask your health care provider or pharmacist if you have questions. COMMON BRAND NAME(S): Mobic What should I tell my health care provider before I take this medicine? They need to know if you have any of these conditions: -bleeding disorders -cigarette smoker -coronary artery bypass graft (CABG) surgery within the past 2 weeks -drink more than 3 alcohol-containing drinks per day -heart disease -high blood pressure -history of stomach bleeding -kidney disease -liver disease -lung or breathing disease, like asthma -stomach or intestine problems -an unusual or allergic reaction to meloxicam, aspirin, other NSAIDs, other medicines, foods, dyes, or preservatives -pregnant or trying to get pregnant -breast-feeding How should I use this medicine? Take this medicine by mouth with a full glass of water. Follow the directions on the prescription label. You can take it with or without food. If it upsets your stomach, take it with food. Take your medicine at regular intervals. Do not take it more often than directed. Do not stop taking except on your doctor's advice. A special MedGuide will be given to you by the pharmacist with each prescription and refill. Be sure to read this information carefully each time. Talk to your pediatrician regarding the use of this medicine in children. While this drug may be prescribed for selected conditions, precautions do apply. Patients over 14 years old may have a stronger reaction and need a smaller dose. Overdosage: If you think you have taken too much of this medicine contact a poison control center or emergency room at once. NOTE: This medicine is only for  you. Do not share this medicine with others. What if I miss a dose? If you miss a dose, take it as soon as you can. If it is almost time for your next dose, take only that dose. Do not take double or extra doses. What may interact with this medicine? Do not take this medicine with any of the following medications: -cidofovir -ketorolac This medicine may also interact with the following medications: -aspirin and aspirin-like medicines -certain medicines for blood pressure, heart disease, irregular heart beat -certain medicines for depression, anxiety, or psychotic disturbances -certain medicines that treat or prevent blood clots like warfarin, enoxaparin, dalteparin, apixaban, dabigatran, rivaroxaban -cyclosporine -diuretics -methotrexate -other NSAIDs, medicines for pain and inflammation, like ibuprofen and naproxen -pemetrexed This list may not describe all possible interactions. Give your health care provider a list of all the medicines, herbs, non-prescription drugs, or dietary supplements you use. Also tell them if you smoke, drink alcohol, or use illegal drugs. Some items may interact with your medicine. What should I watch for while using this medicine? Tell your doctor or healthcare professional if your symptoms do not start to get better or if they get worse. Do not take other medicines that contain aspirin, ibuprofen, or naproxen with this medicine. Side effects such as stomach upset, nausea, or ulcers may be more likely to occur. Many medicines available without a prescription should not be taken with this medicine. This medicine can cause ulcers and bleeding in the stomach and intestines at any time during treatment. This can happen with no warning and may cause death. There is increased risk with taking this medicine for a long time. Smoking, drinking alcohol, older age,  and poor health can also increase risks. Call your doctor right away if you have stomach pain or blood in your  vomit or stool. This medicine does not prevent heart attack or stroke. In fact, this medicine may increase the chance of a heart attack or stroke. The chance may increase with longer use of this medicine and in people who have heart disease. If you take aspirin to prevent heart attack or stroke, talk with your doctor or health care professional. What side effects may I notice from receiving this medicine? Side effects that you should report to your doctor or health care professional as soon as possible: -allergic reactions like skin rash, itching or hives, swelling of the face, lips, or tongue -nausea, vomiting -signs and symptoms of a blood clot such as breathing problems; changes in vision; chest pain; severe, sudden headache; pain, swelling, warmth in the leg; trouble speaking; sudden numbness or weakness of the face, arm, or leg -signs and symptoms of bleeding such as bloody or black, tarry stools; red or dark-brown urine; spitting up blood or brown material that looks like coffee grounds; red spots on the skin; unusual bruising or bleeding from the eye, gums, or nose -signs and symptoms of liver injury like dark yellow or brown urine; general ill feeling or flu-like symptoms; light-colored stools; loss of appetite; nausea; right upper belly pain; unusually weak or tired; yellowing of the eyes or skin -signs and symptoms of stroke like changes in vision; confusion; trouble speaking or understanding; severe headaches; sudden numbness or weakness of the face, arm, or leg; trouble walking; dizziness; loss of balance or coordination Side effects that usually do not require medical attention (report to your doctor or health care professional if they continue or are bothersome): -constipation -diarrhea -gas This list may not describe all possible side effects. Call your doctor for medical advice about side effects. You may report side effects to FDA at 1-800-FDA-1088. Where should I keep my  medicine? Keep out of the reach of children. Store at room temperature between 15 and 30 degrees C (59 and 86 degrees F). Throw away any unused medicine after the expiration date. NOTE: This sheet is a summary. It may not cover all possible information. If you have questions about this medicine, talk to your doctor, pharmacist, or health care provider.  2019 Elsevier/Gold Standard (2017-07-20 11:22:56)      Chronic Pain, Adult Chronic pain is a type of pain that lasts or keeps coming back (recurs) for at least six months. You may have chronic headaches, abdominal pain, or body pain. Chronic pain may be related to an illness, such as fibromyalgia or complex regional pain syndrome. Sometimes the cause of chronic pain is not known. Chronic pain can make it hard for you to do daily activities. If not treated, chronic pain can lead to other health problems, including anxiety and depression. Treatment depends on the cause and severity of your pain. You may need to work with a pain specialist to come up with a treatment plan. The plan may include medicine, counseling, and physical therapy. Many people benefit from a combination of two or more types of treatment to control their pain. Follow these instructions at home: Lifestyle  Consider keeping a pain diary to share with your health care providers.  Consider talking with a mental health care provider (psychologist) about how to cope with chronic pain.  Consider joining a chronic pain support group.  Try to control or lower your stress levels. Talk to  your health care provider about strategies to do this. General instructions   Take over-the-counter and prescription medicines only as told by your health care provider.  Follow your treatment plan as told by your health care provider. This may include: ? Gentle, regular exercise. ? Eating a healthy diet that includes foods such as vegetables, fruits, fish, and lean meats. ? Cognitive or  behavioral therapy. ? Working with a Community education officer. ? Meditation or yoga. ? Acupuncture or massage therapy. ? Aroma, color, light, or sound therapy. ? Local electrical stimulation. ? Shots (injections) of numbing or pain-relieving medicines into the spine or the area of pain.  Check your pain level as told by your health care provider. Ask your health care provider if you should use a pain scale.  Learn as much as you can about how to manage your chronic pain. Ask your health care provider if an intensive pain rehabilitation program or a chronic pain specialist would be helpful.  Keep all follow-up visits as told by your health care provider. This is important. Contact a health care provider if:  Your pain gets worse.  You have new pain.  You have trouble sleeping.  You have trouble doing your normal activities.  Your pain is not controlled with treatment.  Your have side effects from pain medicine.  You feel weak. Get help right away if:  You lose feeling or have numbness in your body.  You lose control of bowel or bladder function.  Your pain suddenly gets much worse.  You develop shaking or chills.  You develop confusion.  You develop chest pain.  You have trouble breathing or shortness of breath.  You pass out.  You have thoughts about hurting yourself or others. This information is not intended to replace advice given to you by your health care provider. Make sure you discuss any questions you have with your health care provider. Document Released: 12/10/2001 Document Revised: 11/18/2015 Document Reviewed: 09/07/2015 Elsevier Interactive Patient Education  2019 [employer].

## 2018-04-05 NOTE — Progress Notes (Signed)
Sick Visit  Subjective:    Patient ID: Catherine Pope, female    DOB: 1965-06-07, 53 y.o.   MRN: [id number]   Chief Complaint  Patient presents with  . leg weakness    HPI Catherine Pope is a 53 year old female with a past medical history of Sinusitis, Migraine, Hypertension, HPV, Diabetes, Depression, and Anxiety. She is here today for a sick visit.    Current Status: Since her last office visit, she has been having bilateral leg and back pain sweakness, worse in right legs. This has been happening for a few weeks, and has been worsening since. She has chronic lower back pain with sciatica. She reports that she she has been treated with injections, which are no longer effective. She has also had treatment for chronic pain via Physical Therapy that was also not effective in relieving her pain. She states that Flexeril is effective, but Diclofenac is ineffective for pain relief.   She denies fevers, chills, fatigue, recent infections, weight loss, and night sweats. She has not had any headaches, visual changes, dizziness, and falls. No chest pain, heart palpitations, cough and shortness of breath reported. No reports of GI problems such as nausea, vomiting, diarrhea, and constipation. She has no reports of blood in stools, dysuria and hematuria. No depression or anxiety reported.   Review of Systems  Constitutional: Negative.   HENT: Negative.   Eyes: Negative.   Respiratory: Negative.   Cardiovascular: Negative.   Gastrointestinal: Negative.   Genitourinary: Negative.   Musculoskeletal: Positive for arthralgias (Generalized.) and back pain (chronic).  Skin: Negative.   Allergic/Immunologic: Negative.   Hematological: Negative.   Psychiatric/Behavioral: Negative.    Objective:   Physical Exam Vitals signs and nursing note reviewed.  Constitutional:      Appearance: Normal appearance. She is obese.  HENT:     Head: Normocephalic and atraumatic.     Right Ear: External ear normal.      Left Ear: External ear normal.     Nose: Nose normal.     Mouth/Throat:     Mouth: Mucous membranes are moist.     Pharynx: Oropharynx is clear.  Eyes:     Conjunctiva/sclera: Conjunctivae normal.  Neck:     Musculoskeletal: Normal range of motion and neck supple.  Cardiovascular:     Rate and Rhythm: Normal rate.     Pulses: Normal pulses.     Heart sounds: Normal heart sounds.  Abdominal:     General: Bowel sounds are normal. There is distension (Obese).  Musculoskeletal: Normal range of motion.  Skin:    General: Skin is warm and dry.     Capillary Refill: Capillary refill takes less than 2 seconds.  Neurological:     General: No focal deficit present.     Mental Status: She is alert and oriented to person, place, and time.  Psychiatric:        Mood and Affect: Mood normal.        Behavior: Behavior normal.        Thought Content: Thought content normal.        Judgment: Judgment normal.    Assessment & Plan:   1. Chronic pain syndrome We will initiate Mobic today.  - meloxicam (MOBIC) 15 MG tablet; Take 1 tablet (15 mg total) by mouth daily.  Dispense: 30 tablet; Refill: 3 - Ambulatory referral to Pain Clinic  2. Low back pain, unspecified back pain laterality, unspecified chronicity, unspecified whether sciatica present  3.  Class 2 severe obesity due to excess calories with serious comorbidity and body mass index (BMI) of 38.0 to 38.9 in adult [facility]) Body mass index is 38.9 kg/m.  Goal BMI  is <30. Encouraged efforts to reduce weight include engaging in physical activity as tolerated with goal of 150 minutes per week. Improve dietary choices and eat a meal regimen consistent with a Mediterranean or DASH diet. Reduce simple carbohydrates. Do not skip meals and eat healthy snacks throughout the day to avoid over-eating at dinner. Set a goal weight loss that is achievable for you.  4. Type 2 diabetes mellitus with stage 4 chronic kidney disease, with long-term current  use of insulin (HCC) Hgb A1c at 9.5 on 02/14/2018, from 13.3 on 10/24/2017. She will continue antidiabetic medications as prescribed.  She will continue to decrease foods/beverages high in sugars and carbs and follow Heart Healthy or DASH diet. Increase physical activity to at least 30 minutes cardio exercise daily.   5. Follow up She will keep previously scheduled follow up appointment.   Meds ordered this encounter  Medications  . meloxicam (MOBIC) 15 MG tablet    Sig: Take 1 tablet (15 mg total) by mouth daily.    Dispense:  30 tablet    Refill:  [place],  MSN, FNP-C [facility] [address] [place], [place] [address] [phone]

## 2018-04-08 ENCOUNTER — Other Ambulatory Visit: Payer: Self-pay | Admitting: Physician Assistant

## 2018-04-09 ENCOUNTER — Other Ambulatory Visit: Payer: Self-pay | Admitting: Physician Assistant

## 2018-04-15 ENCOUNTER — Ambulatory Visit (INDEPENDENT_AMBULATORY_CARE_PROVIDER_SITE_OTHER): Payer: Medicaid Other | Admitting: Licensed Clinical Social Worker

## 2018-04-15 DIAGNOSIS — F418 Other specified anxiety disorders: Secondary | ICD-10-CM

## 2018-04-15 NOTE — Progress Notes (Signed)
Comprehensive Clinical Assessment (CCA) Note  04/15/2018 Catherine Pope [id number]  Visit Diagnosis:      ICD-10-CM   1. 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  (See scanned document in Chart Review)  CCA Part Two A  Intake/Chief Complaint:  CCA Intake With Chief Complaint CCA Part Two Date: 04/15/18 Chief Complaint/Presenting Problem: Multiple health issues, "family drama" and overall depression. Client report depression lingering longer than usuall, as well as anxiety Patients Currently Reported Symptoms/Problems: sleeping less "my brain won't turn off" not working due to health issues, fighting with disabilities; adopted 3 kids 3 years ago with lack of family support Collateral Involvement: MAR Individual's Preferences: therapy Individual's Abilities: able to take care of children Initial Clinical Notes/Concerns: 2015 lost job and father passed away, started with panic attacks,   Mental Health Symptoms Depression:  Depression: Change in energy/activity, Fatigue, Sleep (too much or little), Difficulty Concentrating, Irritability, Hopelessness, Worthlessness(no SI, "feels overwhleming all the time")  Mania:  Mania: N/A  Anxiety:   Anxiety: Difficulty concentrating, Sleep, Worrying, Restlessness, Irritability("a few panic attacks")  Psychosis:  Psychosis: Delusions(clt reports "feeling like people are watching me, 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")  Trauma:     Obsessions:  Obsessions: N/A  Compulsions:  Compulsions: N/A  Inattention:  Inattention: N/A  Hyperactivity/Impulsivity:  Hyperactivity/Impulsivity: N/A  Oppositional/Defiant Behaviors:  Oppositional/Defiant Behaviors: N/A  Borderline Personality:  Emotional Irregularity: N/A  Other Mood/Personality Symptoms:      Mental Status Exam Appearance and self-care  Stature:  Stature: Average  Weight:  Weight: Overweight  Clothing:  Clothing: Casual   Grooming:  Grooming: Normal  Cosmetic use:  Cosmetic Use: Age appropriate  Posture/gait:  Posture/Gait: Normal  Motor activity:  Motor Activity: Not Remarkable  Sensorium  Attention:  Attention: Normal  Concentration:  Concentration: Anxiety interferes  Orientation:  Orientation: X5  Recall/memory:  Recall/Memory: Normal  Affect and Mood  Affect:  Affect: Appropriate  Mood:  Mood: Depressed, Anxious  Relating  Eye contact:  Eye Contact: Normal  Facial expression:  Facial Expression: Responsive  Attitude toward examiner:  Attitude Toward Examiner: Cooperative  Thought and Language  Speech flow: Speech Flow: Normal  Thought content:  Thought Content: Appropriate to mood and circumstances  Preoccupation:  Preoccupations: Other (Comment)(worried people are following)  Hallucinations:  Hallucinations: Other (Comment)  Organization:     Transport planner of Knowledge:  Fund of Knowledge: Average  Intelligence:  Intelligence: Average  Abstraction:  Abstraction: Normal  Judgement:  Judgement: Normal  Reality Testing:  Reality Testing: Realistic  Insight:  Insight: Fair  Decision Making:  Decision Making: Normal  Social Functioning  Social Maturity:  Social Maturity: Isolates, Responsible  Social Judgement:  Social Judgement: Normal  Stress  Stressors:  Stressors: Family conflict, Grief/losses, Illness, Work(questioning taking on 3 children;  wanting to be better mother due to current isolation)  Coping Ability:  Coping Ability: Research officer, political party Deficits:     Supports:      Family and Psychosocial History: Family history Marital status: Single Are you sexually active?: No Does patient have children?: Yes How many children?: 5 How is patient's relationship with their children?: 2 biological 3 adopted children; bio children adult; client close with children; oldest daughter still at home  Childhood History:  Childhood History By whom was/is the patient raised?: Both  parents(parents separate at 24; poor relationship with father "he used  to deny use") Description of patient's relationship with caregiver when they were a child: appropriate Patient's description of current relationship with people who raised him/her: okay with mother, some disagreements How were you disciplined when you got in trouble as a child/adolescent?: appropiate Does patient have siblings?: Yes Number of Siblings: 4 Description of patient's current relationship with siblings: 1 sibling passed away; gets along with brother Did patient suffer any verbal/emotional/physical/sexual abuse as a child?: No Did patient suffer from severe childhood neglect?: No Has patient ever been sexually abused/assaulted/raped as an adolescent or adult?: No Was the patient ever a victim of a crime or a disaster?: No Witnessed domestic violence?: No Has patient been effected by domestic violence as an adult?: No  CCA Part Two B  Employment/Work Situation: Employment / Work Copywriter, advertising Employment situation: Unemployed(attempting to get disability for physical health) Patient's job has been impacted by current illness: No 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 Where was the patient employed at that time?: 9 years [employer] Did You Receive Any Psychiatric Treatment/Services While in the [employer]?: No Are There Guns or Other Weapons in [place]?: No Are These Psychologist, educational?: No  Education: Education Did Teacher, adult education From [employer]?: Yes Did Physicist, medical?: [employer] college) Did Heritage manager?: No Did You Have An Individualized Education Program (IIEP): No Did You Have Any Difficulty At [employer]?: No  Religion: Religion/Spirituality Are You A Religious Person?: Yes How Might This Affect Treatment?: finds good support  Leisure/Recreation: Leisure / Recreation Leisure and Hobbies: "not much now" previously spending time  with children, shopping, out to movies, bowling  Exercise/Diet: Exercise/Diet Do You Exercise?: No Have You Gained or Lost A Significant Amount of Weight in the Past Six Months?: No Do You Follow a Special Diet?: No Do You Have Any Trouble Sleeping?: Yes  CCA Part Two C  Alcohol/Drug Use: Alcohol / Drug Use History of alcohol / drug use?: No history of alcohol / drug abuse      Client denies    CCA Part Three  ASAM's:  Six Dimensions of Multidimensional Assessment  Dimension 1:  Acute Intoxication and/or Withdrawal Potential:     Dimension 2:  Biomedical Conditions and Complications:     Dimension 3:  Emotional, Behavioral, or Cognitive Conditions and Complications:     Dimension 4:  Readiness to Change:     Dimension 5:  Relapse, Continued use, or Continued Problem Potential:     Dimension 6:  Recovery/Living Environment:      Substance use Disorder (SUD)    Social Function:  Social Functioning Social Maturity: Isolates, Responsible Social Judgement: Normal  Stress:  Stress Stressors: Family conflict, Grief/losses, Illness, Work(questioning taking on 3 children;  wanting to be better mother due to current isolation) Coping Ability: Exhausted Patient Takes Medications The Way The Doctor Instructed?: Yes Priority Risk: Low Acuity  Risk Assessment- Self-Harm Potential: Risk Assessment For Self-Harm Potential Thoughts of Self-Harm: No current thoughts Method: No plan Availability of Means: No access/NA  Risk Assessment -Dangerous to Others Potential: Risk Assessment For Dangerous to Others Potential Method: No Plan  DSM5 Diagnoses: Patient Active Problem List   Diagnosis Date Noted  . LGSIL on Pap smear of cervix 12/28/2017  . 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 Hoarseness 04/30/2017  . Laryngopharyngeal reflux (LPR) 04/30/2017  . Perennial allergic rhinitis 04/30/2017  . Chronic pansinusitis 04/30/2017  . Cervical vertebral fusion 12/27/2016  .  Essential  hypertension 03/23/2016  . Laryngitis, acute 03/23/2016  . Neuropathy 03/23/2016  . Tobacco dependence 03/23/2016  . Diabetes mellitus ([place]) 11/26/2013  . Morbid obesity ([place]) 11/26/2013  . Migraine without aura 11/26/2013  . Other and unspecified hyperlipidemia 11/26/2013  . Low back pain 03/23/2013    Patient Centered Plan: Patient is on the following Treatment Plan(s):  Anxiety and Depression  Recommendations for Services/Supports/Treatments: Recommendations for Services/Supports/Treatments Recommendations For Services/Supports/Treatments: Individual Therapy  Treatment Plan Summary: "I want to start to feel better mentally, stronger" Client will decrease symptoms of depression and limit anxiety attacks to no more than 1 time per week    Referrals to Alternative Service(s): Referred to Alternative Service(s):   Place:   Date:   Time:    Referred to Alternative Service(s):   Place:   Date:   Time:    Referred to Alternative Service(s):   Place:   Date:   Time:    Referred to Alternative Service(s):   Place:   Date:   Time:     Olegario Messier, LCSW

## 2018-04-16 ENCOUNTER — Other Ambulatory Visit: Payer: Self-pay | Admitting: Family Medicine

## 2018-04-16 DIAGNOSIS — M545 Low back pain, unspecified: Secondary | ICD-10-CM

## 2018-04-16 DIAGNOSIS — Z760 Encounter for issue of repeat prescription: Secondary | ICD-10-CM

## 2018-04-17 ENCOUNTER — Ambulatory Visit (INDEPENDENT_AMBULATORY_CARE_PROVIDER_SITE_OTHER): Payer: Medicaid Other | Admitting: Endocrinology

## 2018-04-17 ENCOUNTER — Encounter: Payer: Self-pay | Admitting: Endocrinology

## 2018-04-17 VITALS — BP 128/80 | HR 98 | Ht 66.0 in | Wt 242.6 lb

## 2018-04-17 DIAGNOSIS — E1122 Type 2 diabetes mellitus with diabetic chronic kidney disease: Secondary | ICD-10-CM

## 2018-04-17 DIAGNOSIS — N184 Chronic kidney disease, stage 4 (severe): Secondary | ICD-10-CM

## 2018-04-17 DIAGNOSIS — Z794 Long term (current) use of insulin: Secondary | ICD-10-CM | POA: Diagnosis not present

## 2018-04-17 LAB — POCT GLYCOSYLATED HEMOGLOBIN (HGB A1C): Hemoglobin A1C: 7.8 % — AB (ref 4.0–5.6)

## 2018-04-17 NOTE — Patient Instructions (Addendum)
check your blood sugar twice a day.  vary the time of day when you check, between before the 3 meals, and at bedtime.  also check if you have symptoms of your blood sugar being too high or too low.  please keep a record of the readings and bring it to your next appointment here (or you can bring the meter itself).  You can write it on any piece of paper.  please call us sooner if your blood sugar goes below 70, or if you have a lot of readings over 200.  Please continue the same insulin Please come back for a follow-up appointment in 3 months.    

## 2018-04-17 NOTE — Progress Notes (Signed)
Subjective:    Patient ID: Catherine Pope, female    DOB: [birth year], 53 y.o.   MRN: [id number]  HPI DM type: Insulin-requiring type 2 Dx'ed: 4098 Complications: polyneuropathy Therapy: insulin since 2018 GDM: never DKA: never Severe hypoglycemia: never Pancreatitis: never Pancreatic imaging: normal on 2005 CT.   Other: she takes qd insulin, after poor results with multiple daily injections; She gets meds at [facility].  Interval history: no cbg record, but states cbg's are in the 100's.  It is in general higher as the day goes on.  Pt says she never misses the insulin.  pt states she feels well in general.  Past Medical History:  Diagnosis Date  . Anxiety   . Depression   . Diabetes mellitus    dx back in 2010  . History of claustrophobia   . 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 Hypertension   . Migraine   . Sinusitis     Past Surgical History:  Procedure Laterality Date  . ABDOMINAL HYSTERECTOMY     per pt , in her 20's  . ANTERIOR CERVICAL DECOMP/DISCECTOMY FUSION N/A 12/27/2016   Procedure: CERVICAL SIX-SEVEN ANTERIOR CERVICAL DECOMPRESSION/DISCECTOMY FUSION;  Surgeon: Eustace Moore, MD;  Location: [facility];  Service: Neurosurgery;  Laterality: N/A;  . ANTERIOR CRUCIATE LIGAMENT REPAIR     right knee  . BREAST EXCISIONAL BIOPSY Left over 20 years ago   benign  . BREAST SURGERY     2 lumps removed at age 3, left breast/ benign  . RADIOLOGY WITH ANESTHESIA Left 12/11/2017   Procedure: MRI cervical spine and left shoulder WITH ANESTHESIA;  Surgeon: Radiologist, Medication, MD;  Location: [facility];  Service: Radiology;  Laterality: Left;  . SHOULDER ARTHROSCOPY     right shoulder    Social History   Socioeconomic History  . Marital status: Single    Spouse name: Not on file  . Number of children: 5  . Years of education: 76 th  . Highest education level: Not on file  Occupational History  . Occupation:  unemployed  Social Needs  . Financial resource strain: Very hard  . Food insecurity:    Worry: Sometimes true    Inability: Sometimes true  . Transportation needs:    Medical: No    Non-medical: No  Tobacco Use  . Smoking status: Current Every Day Smoker    Packs/day: 0.50    Years: 15.00    Pack years: 7.50    Types: Cigarettes  . Smokeless tobacco: Never Used  . Tobacco comment: less than half pack a day.  Substance and Sexual Activity  . Alcohol use: No  . Drug use: No  . Sexual activity: Not Currently  Lifestyle  . Physical activity:    Days per week: 0 days    Minutes per session: 0 min  . Stress: Very much  Relationships  . Social connections:    Talks on phone: More than three times a week    Gets together: Never    Attends religious service: Never    Active member of club or organization: No    Attends meetings of clubs or organizations: Never    Relationship status: Never married  . Intimate partner violence:    Fear of current or ex partner: No    Emotionally abused: No    Physically abused: No    Forced sexual activity: No  Other Topics Concern  .  Not on file  Social History Narrative   Patient lives at home with her daughters and she is unemployed. Patient has two children.   Patient has a high school education.   Right handed.    Caffeine two cups daily.    Current Outpatient Medications on File Prior to Visit  Medication Sig Dispense Refill  . albuterol (PROVENTIL HFA;VENTOLIN HFA) 108 (90 Base) MCG/ACT inhaler Inhale 1-2 puffs into the lungs every 6 (six) hours as needed for wheezing or shortness of breath. 1 Inhaler 0  . busPIRone (BUSPAR) 30 MG tablet Take 1 tablet (30 mg total) by mouth 2 (two) times daily. 60 tablet 6  . Cholecalciferol (VITAMIN D PO) Take 500 Units by mouth daily.     . Cyanocobalamin (VITAMIN B 12 PO) Take 1,000 mg by mouth daily.    . Diclofenac Sodium 3 % GEL Apply 1 application topically daily as needed (pain).   1  .  DULoxetine (CYMBALTA) 20 MG capsule Take one capsule daily for one week and than twice a day 60 capsule 1  . fluticasone (FLONASE) 50 MCG/ACT nasal spray Place 2 sprays into both nostrils as needed.   1  . folic acid (FOLVITE) 979 MCG tablet Take 400 mcg by mouth daily. Take 2 pills in am and 2 pills at night    . gabapentin (NEURONTIN) 300 MG capsule Take 1 capsule (300 mg total) by mouth 3 (three) times daily. 90 capsule 5  . glucose blood (TRUE METRIX BLOOD GLUCOSE TEST) test strip Use as instructed 100 each 12  . hydrochlorothiazide (HYDRODIURIL) 25 MG tablet Take 1 tablet (25 mg total) by mouth daily. 90 tablet 3  . hydrOXYzine (ATARAX/VISTARIL) 25 MG tablet Take 1 tablet (25 mg total) by mouth 2 (two) times daily as needed. 60 tablet 1  . imiquimod (ALDARA) 5 % cream Apply topically 3 (three) times a week. 12 each 3  . Insulin Glargine (LANTUS SOLOSTAR) 100 UNIT/ML Solostar Pen Inject 140 Units into the skin every morning. And pen needles 1/day 15 mL 11  . Lancets (ONETOUCH ULTRASOFT) lancets Use as instructed 100 each 12  . lisinopril (PRINIVIL,ZESTRIL) 20 MG tablet Take 1 tablet (20 mg total) by mouth daily. 30 tablet 5  . lovastatin (MEVACOR) 20 MG tablet Take 1 tablet (20 mg total) by mouth at bedtime. 90 tablet 3  . meloxicam (MOBIC) 15 MG tablet Take 1 tablet (15 mg total) by mouth daily. 30 tablet 3  . Omega-3 Fatty Acids (FISH OIL PO) Take 360 mg by mouth. Take 2 pills at bedtime    . QUEtiapine (SEROQUEL) 100 MG tablet Take 1 tablet (100 mg total) by mouth at bedtime. 30 tablet 4  . SUMAtriptan (IMITREX) 25 MG tablet Take 1 tablet (25 mg total) by mouth daily. May take one more tablet two hours after the first. No more than two tablets per day. (Patient taking differently: Take 25 mg by mouth daily as needed. May take one more tablet two hours after the first. No more than two tablets per day.) 30 tablet 0  . topiramate (TOPAMAX) 50 MG tablet TAKE 2 TABLETS AT NIGHT AND 1 TABLET 12  HOURS AFTER DAILY. 90 tablet 1  . traZODone (DESYREL) 100 MG tablet Take 1/2 to one tab as needed for insomnia 30 tablet 1   No current facility-administered medications on file prior to visit.     No Known Allergies  Family History  Problem Relation Age of Onset  . Heart Problems  Father   . Diabetes Father   . Hypertension Other   . Heart disease Other   . Stroke Sister   . Kidney disease Brother     BP 128/80 (BP Location: Left Arm, Patient Position: Sitting, Cuff Size: Large)   Pulse 98   Ht 5\' 6"  (1.676 m)   Wt 242 lb 9.6 oz (110 kg)   SpO2 97%   BMI 39.16 kg/m    Review of Systems She denies hypoglycemia    Objective:   Physical Exam VITAL SIGNS:  See vs page GENERAL: no distress Pulses: dorsalis pedis intact bilat.   MSK: no deformity of the feet CV: no leg edema Skin:  no ulcer on the feet.  normal color and temp on the feet. Neuro: sensation is intact to touch on the feet.   A1c=7.8%     Assessment & Plan:  Insulin-requiring type 2 DM, with PN: this is the best control this pt should aim for, given this regimen, which does match insulin to her changing needs throughout the day.   Patient Instructions  check your blood sugar twice a day.  vary the time of day when you check, between before the 3 meals, and at bedtime.  also check if you have symptoms of your blood sugar being too high or too low.  please keep a record of the readings and bring it to your next appointment here (or you can bring the meter itself).  You can write it on any piece of paper.  please call us sooner if your blood sugar goes below 70, or if you have a lot of readings over 200.    Please continue the same insulin. Please come back for a follow-up appointment in 3 months.

## 2018-04-18 ENCOUNTER — Telehealth: Payer: Self-pay

## 2018-04-18 ENCOUNTER — Other Ambulatory Visit: Payer: Self-pay | Admitting: Family Medicine

## 2018-04-18 DIAGNOSIS — M545 Low back pain, unspecified: Secondary | ICD-10-CM

## 2018-04-18 DIAGNOSIS — Z760 Encounter for issue of repeat prescription: Secondary | ICD-10-CM

## 2018-04-19 ENCOUNTER — Other Ambulatory Visit: Payer: Self-pay | Admitting: Family Medicine

## 2018-04-19 DIAGNOSIS — M545 Low back pain, unspecified: Secondary | ICD-10-CM

## 2018-04-19 DIAGNOSIS — Z760 Encounter for issue of repeat prescription: Secondary | ICD-10-CM

## 2018-04-19 MED ORDER — CYCLOBENZAPRINE HCL 10 MG PO TABS: 10.0000 mg | ORAL_TABLET | Freq: Three times a day (TID) | ORAL | 0 refills | Status: DC | PRN

## 2018-04-19 MED FILL — CYCLOBENZAPRINE 10 MG TAB: 10 | 10 days supply | Qty: 30 | Fill #0

## 2018-04-19 NOTE — Telephone Encounter (Signed)
Patient notified and would like to know if paperwork has been filled out yet

## 2018-04-19 NOTE — Telephone Encounter (Signed)
Patient needs a refill on Cyclobenzaprine.

## 2018-04-22 MED FILL — traZODone HCL 100 MG TABS: 100 | 30 days supply | Qty: 30 | Fill #1

## 2018-04-22 MED FILL — DULoxetine HCL 20 MG CPEP: 20 | 33 days supply | Qty: 60 | Fill #1

## 2018-04-22 MED FILL — LANTUS SOLOSTAR 100 UNITS/M: 100 | 21 days supply | Qty: 30 | Fill #3

## 2018-04-22 MED FILL — busPIRone HCL 30 MG TABS: 30 | 30 days supply | Qty: 60 | Fill #2

## 2018-04-26 MED FILL — LISINOPRIL 20 MG TAB: 20 | 30 days supply | Qty: 30 | Fill #4

## 2018-04-26 MED FILL — SUMATRIPTAN SUCC 25 MG TAB: 25 | 10 days supply | Qty: 3 | Fill #3

## 2018-04-26 MED FILL — TOPIRAMATE 50 MG TABLET: 50 | 30 days supply | Qty: 90 | Fill #1

## 2018-04-29 ENCOUNTER — Encounter: Payer: Self-pay | Admitting: Family Medicine

## 2018-04-29 ENCOUNTER — Ambulatory Visit (INDEPENDENT_AMBULATORY_CARE_PROVIDER_SITE_OTHER): Payer: Medicaid Other | Admitting: Family Medicine

## 2018-04-29 VITALS — BP 140/72 | HR 90 | Temp 98.4°F | Ht 66.0 in | Wt 247.0 lb

## 2018-04-29 DIAGNOSIS — Z09 Encounter for follow-up examination after completed treatment for conditions other than malignant neoplasm: Secondary | ICD-10-CM | POA: Diagnosis not present

## 2018-04-29 NOTE — Progress Notes (Signed)
Established Patient Office Visit  Subjective:  Patient ID: Catherine Pope, female    DOB: [birth year]  Age: 53 y.o. MRN: [id number]  CC:  Chief Complaint  Patient presents with  . Paperwork    disability     HPI Catherine Pope is a 53 year old female for review of paperwork. She is currently pursuing disability through a local attorney.   Past Medical History:  Diagnosis Date  . Anxiety   . Depression   . Diabetes mellitus    dx back in 2010  . History of claustrophobia   . 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 Hypertension   . Migraine   . Sinusitis     Past Surgical History:  Procedure Laterality Date  . ABDOMINAL HYSTERECTOMY     per pt , in her 20's  . ANTERIOR CERVICAL DECOMP/DISCECTOMY FUSION N/A 12/27/2016   Procedure: CERVICAL SIX-SEVEN ANTERIOR CERVICAL DECOMPRESSION/DISCECTOMY FUSION;  Surgeon: Eustace Moore, MD;  Location: [facility];  Service: Neurosurgery;  Laterality: N/A;  . ANTERIOR CRUCIATE LIGAMENT REPAIR     right knee  . BREAST EXCISIONAL BIOPSY Left over 20 years ago   benign  . BREAST SURGERY     2 lumps removed at age 53, left breast/ benign  . RADIOLOGY WITH ANESTHESIA Left 12/11/2017   Procedure: MRI cervical spine and left shoulder WITH ANESTHESIA;  Surgeon: Radiologist, Medication, MD;  Location: [facility];  Service: Radiology;  Laterality: Left;  . SHOULDER ARTHROSCOPY     right shoulder    Family History  Problem Relation Age of Onset  . Heart Problems Father   . Diabetes Father   . Hypertension Other   . Heart disease Other   . Stroke Sister   . Kidney disease Brother     Social History   Socioeconomic History  . Marital status: Single    Spouse name: Not on file  . Number of children: 5  . Years of education: 3 th  . Highest education level: Not on file  Occupational History  . Occupation: unemployed  Social Needs  . Financial resource strain: Very hard  . Food  insecurity:    Worry: Sometimes true    Inability: Sometimes true  . Transportation needs:    Medical: No    Non-medical: No  Tobacco Use  . Smoking status: Current Every Day Smoker    Packs/day: 0.50    Years: 15.00    Pack years: 7.50    Types: Cigarettes  . Smokeless tobacco: Never Used  . Tobacco comment: less than half pack a day.  Substance and Sexual Activity  . Alcohol use: No  . Drug use: No  . Sexual activity: Not Currently  Lifestyle  . Physical activity:    Days per week: 0 days    Minutes per session: 0 min  . Stress: Very much  Relationships  . Social connections:    Talks on phone: More than three times a week    Gets together: Never    Attends religious service: Never    Active member of club or organization: No    Attends meetings of clubs or organizations: Never    Relationship status: Never married  . Intimate partner violence:    Fear of current or ex partner: No    Emotionally abused: No    Physically abused: No    Forced sexual activity: No  Other Topics Concern  .  Not on file  Social History Narrative   Patient lives at home with her daughters and she is unemployed. Patient has two children.   Patient has a high school education.   Right handed.    Caffeine two cups daily.    Outpatient Medications Prior to Visit  Medication Sig Dispense Refill  . albuterol (PROVENTIL HFA;VENTOLIN HFA) 108 (90 Base) MCG/ACT inhaler Inhale 1-2 puffs into the lungs every 6 (six) hours as needed for wheezing or shortness of breath. 1 Inhaler 0  . busPIRone (BUSPAR) 30 MG tablet Take 1 tablet (30 mg total) by mouth 2 (two) times daily. 60 tablet 6  . Cholecalciferol (VITAMIN D PO) Take 500 Units by mouth daily.     . Cyanocobalamin (VITAMIN B 12 PO) Take 1,000 mg by mouth daily.    . cyclobenzaprine (FLEXERIL) 10 MG tablet Take 1 tablet (10 mg total) by mouth 3 (three) times daily as needed for muscle spasms. 30 tablet 0  . Diclofenac Sodium 3 % GEL Apply 1  application topically daily as needed (pain).   1  . DULoxetine (CYMBALTA) 20 MG capsule Take one capsule daily for one week and than twice a day 60 capsule 1  . fluticasone (FLONASE) 50 MCG/ACT nasal spray Place 2 sprays into both nostrils as needed.   1  . folic acid (FOLVITE) 130 MCG tablet Take 400 mcg by mouth daily. Take 2 pills in am and 2 pills at night    . gabapentin (NEURONTIN) 300 MG capsule Take 1 capsule (300 mg total) by mouth 3 (three) times daily. 90 capsule 5  . glucose blood (TRUE METRIX BLOOD GLUCOSE TEST) test strip Use as instructed 100 each 12  . hydrochlorothiazide (HYDRODIURIL) 25 MG tablet Take 1 tablet (25 mg total) by mouth daily. 90 tablet 3  . hydrOXYzine (ATARAX/VISTARIL) 25 MG tablet Take 1 tablet (25 mg total) by mouth 2 (two) times daily as needed. 60 tablet 1  . imiquimod (ALDARA) 5 % cream Apply topically 3 (three) times a week. 12 each 3  . Insulin Glargine (LANTUS SOLOSTAR) 100 UNIT/ML Solostar Pen Inject 140 Units into the skin every morning. And pen needles 1/day 15 mL 11  . Lancets (ONETOUCH ULTRASOFT) lancets Use as instructed 100 each 12  . lisinopril (PRINIVIL,ZESTRIL) 20 MG tablet Take 1 tablet (20 mg total) by mouth daily. 30 tablet 5  . lovastatin (MEVACOR) 20 MG tablet Take 1 tablet (20 mg total) by mouth at bedtime. 90 tablet 3  . meloxicam (MOBIC) 15 MG tablet Take 1 tablet (15 mg total) by mouth daily. 30 tablet 3  . Omega-3 Fatty Acids (FISH OIL PO) Take 360 mg by mouth. Take 2 pills at bedtime    . QUEtiapine (SEROQUEL) 100 MG tablet Take 1 tablet (100 mg total) by mouth at bedtime. 30 tablet 4  . SUMAtriptan (IMITREX) 25 MG tablet Take 1 tablet (25 mg total) by mouth daily. May take one more tablet two hours after the first. No more than two tablets per day. (Patient taking differently: Take 25 mg by mouth daily as needed. May take one more tablet two hours after the first. No more than two tablets per day.) 30 tablet 0  . topiramate (TOPAMAX) 50  MG tablet TAKE 2 TABLETS AT NIGHT AND 1 TABLET 12 HOURS AFTER DAILY. 90 tablet 1  . traZODone (DESYREL) 100 MG tablet Take 1/2 to one tab as needed for insomnia 30 tablet 1   No facility-administered medications prior to  visit.     No Known Allergies  ROS Review of Systems    Objective:    Physical Exam  BP 140/72 (BP Location: Left Arm, Patient Position: Sitting, Cuff Size: Large)   Pulse 90   Temp 98.4 F (36.9 C) (Oral)   Ht 5\' 6"  (1.676 m)   Wt 247 lb (112 kg)   SpO2 99%   BMI 39.87 kg/m  Wt Readings from Last 3 Encounters:  04/29/18 247 lb (112 kg)  04/17/18 242 lb 9.6 oz (110 kg)  04/05/18 241 lb (109.3 kg)     Health Maintenance Due  Topic Date Due  . OPHTHALMOLOGY EXAM  05/22/1977    There are no preventive care reminders to display for this patient.  Lab Results  Component Value Date   TSH 2.450 01/28/2018   Lab Results  Component Value Date   WBC 9.1 01/28/2018   HGB 13.6 01/28/2018   HCT 40.5 01/28/2018   MCV 88 01/28/2018   PLT 280 01/28/2018   Lab Results  Component Value Date   NA 142 01/28/2018   K 3.1 (L) 01/28/2018   CO2 22 01/28/2018   GLUCOSE 121 (H) 01/28/2018   BUN 5 (L) 01/28/2018   CREATININE 0.78 01/28/2018   BILITOT 0.3 01/28/2018   ALKPHOS 72 01/28/2018   AST 37 01/28/2018   ALT 31 01/28/2018   PROT 7.3 01/28/2018   ALBUMIN 4.3 01/28/2018   CALCIUM 9.4 01/28/2018   ANIONGAP 12 12/11/2017   Lab Results  Component Value Date   CHOL 143 08/02/2017   Lab Results  Component Value Date   HDL 34 (L) 08/02/2017   Lab Results  Component Value Date   LDLCALC 87 08/02/2017   Lab Results  Component Value Date   TRIG 112 08/02/2017   Lab Results  Component Value Date   CHOLHDL 4.2 08/02/2017   Lab Results  Component Value Date   HGBA1C 7.8 (A) 04/17/2018   Assessment & Plan:   Patient presented paperwork for 'employed' person. She has been unemployed for over 4 years now. Patient is encouraged to present  additional paperwork to be evaluated and completed by our office in the future. Patient verbalized understanding.   Problem List Items Addressed This Visit    None      No orders of the defined types were placed in this encounter.   Follow-up: No follow-ups on file.    Azzie Glatter, FNP

## 2018-04-30 ENCOUNTER — Ambulatory Visit (INDEPENDENT_AMBULATORY_CARE_PROVIDER_SITE_OTHER): Payer: Medicaid Other | Admitting: Licensed Clinical Social Worker

## 2018-04-30 DIAGNOSIS — F418 Other specified anxiety disorders: Secondary | ICD-10-CM | POA: Diagnosis not present
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
## 2018-05-07 MED FILL — HYDROCHLOROTHIAZIDE 25 MG T: 25 | 90 days supply | Qty: 90 | Fill #1

## 2018-05-07 MED FILL — LANTUS SOLOSTAR 100 UNITS/M: 100 | 21 days supply | Qty: 30 | Fill #4

## 2018-05-10 ENCOUNTER — Ambulatory Visit (INDEPENDENT_AMBULATORY_CARE_PROVIDER_SITE_OTHER): Payer: Medicaid Other | Admitting: Psychiatry

## 2018-05-10 DIAGNOSIS — F331 Major depressive disorder, recurrent, moderate: Secondary | ICD-10-CM

## 2018-05-10 DIAGNOSIS — F411 Generalized anxiety disorder: Secondary | ICD-10-CM | POA: Diagnosis not present

## 2018-05-10 MED ORDER — HYDROXYZINE HCL 25 MG PO TABS: 25.0000 mg | ORAL_TABLET | Freq: Two times a day (BID) | ORAL | 1 refills | Status: DC | PRN

## 2018-05-10 MED ORDER — TRAZODONE HCL 100 MG PO TABS: ORAL_TABLET | ORAL | 1 refills | Status: DC

## 2018-05-10 MED ORDER — DULOXETINE HCL 60 MG PO CPEP: ORAL_CAPSULE | ORAL | 1 refills | Status: DC

## 2018-05-10 MED FILL — hydrOXYzine HCL 25 MG TABS: 25 | 30 days supply | Qty: 60 | Fill #0

## 2018-05-10 MED FILL — DULoxetine HCL 60 MG CPEP: 60 | 30 days supply | Qty: 30 | Fill #0
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
## 2018-05-14 ENCOUNTER — Ambulatory Visit (INDEPENDENT_AMBULATORY_CARE_PROVIDER_SITE_OTHER): Payer: Medicaid Other | Admitting: Licensed Clinical Social Worker

## 2018-05-14 DIAGNOSIS — F411 Generalized anxiety disorder: Secondary | ICD-10-CM

## 2018-05-14 DIAGNOSIS — F331 Major depressive disorder, recurrent, moderate: Secondary | ICD-10-CM

## 2018-05-14 NOTE — Progress Notes (Signed)
   THERAPIST PROGRESS NOTE  Session Time: 12:05-12:50  Participation Level: Active  Behavioral Response: CasualAlertDysphoric and Irritable  Type of Therapy: Individual Therapy  Treatment Goals addressed: Coping  Interventions: CBT and Supportive  Summary: KARELI HOSSAIN is a 53 y.o. female who presents with major depressive disorder and generalized anxiety disorder. Client shared about continued family dysfunction and struggles maintaining boundaries with family members, particularly her mother. Client processed with clinician timelines for changes in family dynamics both with her biological children and her mother. Clinician challenged client solutions to implement boundaries by moving away and stopping contact with family members, to which client was receptive. Client identified a change in relationship with her mother started when client started interacting with her nephews children. Client identified this is when relationship with her daughter who lives with her changed as well. Client plan is to meet with daughter and create a more specific move out date and plan. Client states she does not believe her mother is looking to support her, but to be in 'in her business.'  Suicidal/Homicidal: Nowithout intent/plan  Therapist Response: Clinician met with client, assessing for SI/HI/psychosis and overall level of functioning. Clinician praised client stopping discussions when she felt frustrated to avoid additional communication problems with family members who disagreed with her parenting style. Clinician praised client for explaining to younger family members, using I-statements, reasons for being upset. Clinician encouraged client to ask follow up questions in some situations with her family to learn the intent of family member discussions, including if they were looking to support her but were unsure how.  Plan: Return again in 2 weeks.  Diagnosis: Axis I: Generalized Anxiety  Disorder    Olegario Messier, LCSW 05/14/2018

## 2018-05-15 ENCOUNTER — Telehealth: Payer: Self-pay

## 2018-05-15 NOTE — Telephone Encounter (Signed)
Patient has been notified that per provider she is not suppose to fill the form out that the patient is suppose to fill it out. Patient states that she is suppose to fill out some of it and the provider the other part. Patient states that she will come pick paperwork up and take it somewhere else.

## 2018-05-24 ENCOUNTER — Other Ambulatory Visit: Payer: Self-pay | Admitting: Family Medicine

## 2018-05-24 DIAGNOSIS — Z760 Encounter for issue of repeat prescription: Secondary | ICD-10-CM

## 2018-05-24 DIAGNOSIS — M545 Low back pain, unspecified: Secondary | ICD-10-CM

## 2018-05-24 MED FILL — LOVASTATIN 20 MG TABLET: 20 | 90 days supply | Qty: 90 | Fill #1

## 2018-05-24 MED FILL — LISINOPRIL 20 MG TAB: 20 | 30 days supply | Qty: 30 | Fill #5

## 2018-05-24 MED FILL — traZODone HCL 100 MG TABS: 100 | 30 days supply | Qty: 30 | Fill #0

## 2018-05-28 ENCOUNTER — Ambulatory Visit (INDEPENDENT_AMBULATORY_CARE_PROVIDER_SITE_OTHER): Payer: Medicaid Other | Admitting: Licensed Clinical Social Worker

## 2018-05-28 DIAGNOSIS — F331 Major depressive disorder, recurrent, moderate: Secondary | ICD-10-CM | POA: Diagnosis not present

## 2018-05-28 NOTE — Progress Notes (Signed)
   THERAPIST PROGRESS NOTE  Session Time: 12:05pm-12:50pm  Participation Level: Active  Behavioral Response: CasualAlertAnxious  Type of Therapy: Individual Therapy  Treatment Goals addressed: Coping  Interventions: CBT, Solution Focused and Supportive  Summary: Catherine Pope is a 53 y.o. female who presents with symptoms of anxiety and depression. Client verbalizesd frustration with family dynamics continuing.  Client stated she did not used skills due to her level of being upset. Client is open to feedback on the importance of self care. Client does show progress toward goal as evidence by identifying unhelpful behaviors after interactions. Client agrees that when she is depressed she often does not identify anything as positive during the day, or dismisses positive events as unimportant. Client agrees to try and find one moment of joy or gratitude more days that not over the next 2 weeks.  Suicidal/Homicidal: Nowithout intent/plan  Therapist Response: Clinician checks in with client, assessing for SI/HI/psychosis and overall level of functioning. Clinician inquires about recent stressors and use of previous skills learned. Clinician processed with client modeling self care. Clinician praised client for keeping therapy appointments as an activity for herself. Clinician encouraged healthy eating and sleeping habits in addition to taking medications as prescribed to manage physical health. Clinician and client processed why daughters behaviors are so 'frustrating' and discussed their relationship with client compared to clients relationship with her mother. Clinician and client discussed the importance of feeling respected in a relationship. Clinician challenged client to identify one positive thing to be thankful for or that happened each day, reminding client about filtering out positive events when depressed.  Plan: Return again in 2 weeks.  Diagnosis: Axis I: Generalized Anxiety  Disorder, Major Depressive Disorder with anxious features        Olegario Messier, LCSW 05/28/2018

## 2018-06-04 MED FILL — TOPIRAMATE 50 MG TABLET: 50 | 30 days supply | Qty: 60 | Fill #1

## 2018-06-04 MED FILL — DULoxetine HCL 60 MG CPEP: 60 | 30 days supply | Qty: 30 | Fill #1

## 2018-06-04 MED FILL — hydrOXYzine HCL 25 MG TABS: 25 | 30 days supply | Qty: 60 | Fill #1

## 2018-06-10 ENCOUNTER — Other Ambulatory Visit: Payer: Self-pay

## 2018-06-10 DIAGNOSIS — R51 Headache: Principal | ICD-10-CM

## 2018-06-10 DIAGNOSIS — R519 Headache, unspecified: Secondary | ICD-10-CM

## 2018-06-10 MED ORDER — SUMATRIPTAN SUCCINATE 25 MG PO TABS: 25.0000 mg | ORAL_TABLET | Freq: Every day | ORAL | 1 refills | Status: DC

## 2018-06-10 MED FILL — SUMATRIPTAN SUCC 25 MG TAB: 25 | 30 days supply | Qty: 9 | Fill #0

## 2018-06-10 NOTE — Telephone Encounter (Signed)
Medication sent to pharmacy  

## 2018-06-11 ENCOUNTER — Ambulatory Visit (INDEPENDENT_AMBULATORY_CARE_PROVIDER_SITE_OTHER): Payer: Medicaid Other | Admitting: Licensed Clinical Social Worker

## 2018-06-11 DIAGNOSIS — F418 Other specified anxiety disorders: Secondary | ICD-10-CM

## 2018-06-11 NOTE — Progress Notes (Signed)
   THERAPIST PROGRESS NOTE  Session Time: 12:05pm-12:50pm  Participation Level: Active  Behavioral Response: CasualAlertAnxious  Type of Therapy: Individual Therapy  Treatment Goals addressed: Coping  Interventions: CBT, Motivational Interviewing and Supportive  Summary: Catherine Pope is a 53 y.o. female who presents face to face with Generalized anxiety disorder and Major depressive disorder. Client identified several stressors in her life, including upcoming surgery, family, decisions about home schooling. Client identified seeing asking for help as a weakness in herself but not in others. Client shared she belives this started when her daughter was young and she asked her mother for help and it was declined harshly.    Suicidal/Homicidal: Nowithout intent/plan  Therapist Response: Clinician met with client, assessing for SI/HI/psychosis and overall level of functioning. Clinician inquired about changes in recent stressors and client response. Clinician developed discrepancies with client related to views of weakness and asking for help in in self verses others. Clinician praised client on identifying moments where she felt weak due to asking for help and how this has effected her parenting style.  Plan: Return again in 2-3 weeks recovery from surgery.  Diagnosis: Axis I: Generalized Anxiety Disorder    Olegario Messier, LCSW 06/11/2018

## 2018-06-14 MED FILL — LANTUS SOLOSTAR 100 UNITS/M: 100 | 21 days supply | Qty: 30 | Fill #5

## 2018-06-19 ENCOUNTER — Other Ambulatory Visit: Payer: Self-pay

## 2018-06-24 MED FILL — traZODone HCL 100 MG TABS: 100 | 30 days supply | Qty: 30 | Fill #1

## 2018-06-25 ENCOUNTER — Ambulatory Visit (HOSPITAL_COMMUNITY): Payer: Medicaid Other | Admitting: Licensed Clinical Social Worker

## 2018-07-01 ENCOUNTER — Other Ambulatory Visit: Payer: Self-pay | Admitting: Pharmacist

## 2018-07-01 DIAGNOSIS — I10 Essential (primary) hypertension: Secondary | ICD-10-CM

## 2018-07-01 NOTE — Telephone Encounter (Signed)
Patient's pharmacy requesting refills for lisinopril. I cannot refill as PCP is not under my protocol. Will route to PCP for approval.

## 2018-07-02 MED ORDER — LISINOPRIL 20 MG PO TABS: 20.0000 mg | ORAL_TABLET | Freq: Every day | ORAL | 2 refills | Status: DC

## 2018-07-02 MED FILL — BASAGLAR 100 UNIT/ML KWIKPE: 100 | 25 days supply | Qty: 30 | Fill #0

## 2018-07-02 MED FILL — TOPIRAMATE 50 MG TABLET: 50 | 60 days supply | Qty: 120 | Fill #2

## 2018-07-03 MED FILL — LISINOPRIL 20 MG TABLET: 20 | 90 days supply | Qty: 90 | Fill #0

## 2018-07-10 ENCOUNTER — Other Ambulatory Visit: Payer: Self-pay

## 2018-07-10 ENCOUNTER — Ambulatory Visit (INDEPENDENT_AMBULATORY_CARE_PROVIDER_SITE_OTHER): Payer: Medicaid Other | Admitting: Psychiatry

## 2018-07-10 DIAGNOSIS — F331 Major depressive disorder, recurrent, moderate: Secondary | ICD-10-CM | POA: Diagnosis not present

## 2018-07-10 DIAGNOSIS — F411 Generalized anxiety disorder: Secondary | ICD-10-CM | POA: Diagnosis not present

## 2018-07-10 MED ORDER — HYDROXYZINE HCL 25 MG PO TABS: 25.0000 mg | ORAL_TABLET | Freq: Three times a day (TID) | ORAL | 1 refills | Status: DC | PRN

## 2018-07-10 MED ORDER — DULOXETINE HCL 60 MG PO CPEP: ORAL_CAPSULE | ORAL | 1 refills | Status: DC

## 2018-07-10 NOTE — Progress Notes (Signed)
Virtual Visit via Telephone Note  I connected with Catherine Pope on 07/10/18 at 10:40 AM EDT by telephone and verified that I am speaking with the correct person using two identifiers.   I discussed the limitations, risks, security and privacy concerns of performing an evaluation and management service by telephone and the availability of in person appointments. I also discussed with the patient that there may be a patient responsible charge related to this service. The patient expressed understanding and agreed to proceed.   History of Present Illness: Patient is evaluated through phone session.  She told that recently increased anxiety and nervousness due to pandemic coronavirus.  She is concerned about the situation as she takes a lot of medication and had multiple health issues.  She lives with her daughter, and adopted children.  On her last visit we increase Cymbalta which helped her depression and she is no longer having suicidal thoughts or any feeling of hopelessness.  She does not reported any agitation or any crying spells.  However she is concerned about anxiety and sometimes her heart rate go up when she tried to sleep at night.  Patient denies any paranoia, hallucination or any suicidal thoughts.  She is not drinking or using any illegal substances.    Past Psychiatric History: Reviewed. No H/O inpatient or any suicidal attempt.  Seen at [facility] and given Seroquel, Effexor, BuSpar and Wellbutrin.  Effexor cause grogginess, Seroquel cause weight gain increased blood sugar.  Did not took Wellbutrin consistently. No H/O mania or psychosis.  Observations/Objective: Limited mental status examination done on the phone.  Patient describes her mood anxious.  Her speech is soft, clear and coherent.  There were no flight of ideas or loose association.  Her thought process clear and logical.  She ruminates about her anxiety but denies any delusions, paranoia or any active or passive suicidal  thoughts or homicidal thought.  Her attention and concentration is fair.  She is alert and oriented x3.  Her cognition is intact.  Her fund of knowledge is fair.  Her insight judgment is okay.  Assessment and Plan: Major depressive disorder, recurrent.  Generalized anxiety disorder.  I review her current medication.  She is taking gabapentin, pain medication, Topamax, muscle relaxant along with trazodone, hydroxyzine and Cymbalta.  We discussed polypharmacy in detail.  I recommended that she should stop the trazodone since already taking to any medication at bedtime.  I recommend to try hydroxyzine from twice a day to 3 times a day to help with anxiety.  Continue Cymbalta 60 mg daily.  Encouraged to continue therapy with [facility] for [facility]BT.  Discussed medication side effects and benefits.  Discussed safety concern that anytime having active suicidal thoughts or homicidal thought then she need to call 911 or go to local emergency room.  Follow-up in 2 months.  Follow Up Instructions:    I discussed the assessment and treatment plan with the patient. The patient was provided an opportunity to ask questions and all were answered. The patient agreed with the plan and demonstrated an understanding of the instructions.   The patient was advised to call back or seek an in-person evaluation if the symptoms worsen or if the condition fails to improve as anticipated.  I provided 30 minutes of non-face-to-face time during this encounter.   Kathlee Nations, MD

## 2018-07-17 ENCOUNTER — Encounter: Payer: Self-pay | Admitting: Endocrinology

## 2018-07-18 ENCOUNTER — Other Ambulatory Visit: Payer: Self-pay

## 2018-07-18 ENCOUNTER — Ambulatory Visit (INDEPENDENT_AMBULATORY_CARE_PROVIDER_SITE_OTHER): Payer: Medicaid Other | Admitting: Endocrinology

## 2018-07-18 DIAGNOSIS — N184 Chronic kidney disease, stage 4 (severe): Secondary | ICD-10-CM

## 2018-07-18 DIAGNOSIS — Z794 Long term (current) use of insulin: Secondary | ICD-10-CM

## 2018-07-18 DIAGNOSIS — E1122 Type 2 diabetes mellitus with diabetic chronic kidney disease: Secondary | ICD-10-CM

## 2018-07-18 MED ORDER — INSULIN GLARGINE 100 UNIT/ML SOLOSTAR PEN: 150.0000 [IU] | PEN_INJECTOR | SUBCUTANEOUS | 11 refills | Status: DC

## 2018-07-18 NOTE — Progress Notes (Addendum)
Subjective:    Patient ID: Catherine Pope, female    DOB: 05/14/1965, 53 y.o.   MRN: [id number]  HPI  telehealth visit today via doxy video visit.  Alternatives to telehealth are presented to this patient, and the patient agrees to the telehealth visit. Pt is advised of the cost of the visit, and agrees to this, also.   Patient is at home, and I am at the office.   Pt returns for f/u of DM DM type: Insulin-requiring type 2 Dx'ed: 8938 Complications: polyneuropathy Therapy: insulin since 2018 GDM: never DKA: never Severe hypoglycemia: never.   Pancreatitis: never Pancreatic imaging: normal on 2005 CT.   Other: she takes qd insulin, after poor results with multiple daily injections.   Interval history: no cbg record, but states cbg's are in the mid-100's.  It is in general slightly higher as the day goes on.  She has lost a few lbs, since dental procedure.  Pt says she never misses the insulin.  pt states she feels well in general.   Past Medical History:  Diagnosis Date  . Anxiety   . Depression   . Diabetes mellitus    dx back in 2010  . History of claustrophobia   . 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 Hypertension   . Migraine   . Sinusitis     Past Surgical History:  Procedure Laterality Date  . ABDOMINAL HYSTERECTOMY     per pt , in her 20's  . ANTERIOR CERVICAL DECOMP/DISCECTOMY FUSION N/A 12/27/2016   Procedure: CERVICAL SIX-SEVEN ANTERIOR CERVICAL DECOMPRESSION/DISCECTOMY FUSION;  Surgeon: Eustace Moore, MD;  Location: [facility];  Service: Neurosurgery;  Laterality: N/A;  . ANTERIOR CRUCIATE LIGAMENT REPAIR     right knee  . BREAST EXCISIONAL BIOPSY Left over 20 years ago   benign  . BREAST SURGERY     2 lumps removed at age 6, left breast/ benign  . RADIOLOGY WITH ANESTHESIA Left 12/11/2017   Procedure: MRI cervical spine and left shoulder WITH ANESTHESIA;  Surgeon: Radiologist, Medication, MD;   Location: [facility];  Service: Radiology;  Laterality: Left;  . SHOULDER ARTHROSCOPY     right shoulder    Social History   Socioeconomic History  . Marital status: Single    Spouse name: Not on file  . Number of children: 5  . Years of education: 58 th  . Highest education level: Not on file  Occupational History  . Occupation: unemployed  Social Needs  . Financial resource strain: Very hard  . Food insecurity:    Worry: Sometimes true    Inability: Sometimes true  . Transportation needs:    Medical: No    Non-medical: No  Tobacco Use  . Smoking status: Current Every Day Smoker    Packs/day: 0.50    Years: 15.00    Pack years: 7.50    Types: Cigarettes  . Smokeless tobacco: Never Used  . Tobacco comment: less than half pack a day.  Substance and Sexual Activity  . Alcohol use: No  . Drug use: No  . Sexual activity: Not Currently  Lifestyle  . Physical activity:    Days per week: 0 days    Minutes per session: 0 min  . Stress: Very much  Relationships  . Social connections:    Talks on phone: More than three times a week    Gets together: Never    Attends  religious service: Never    Active member of club or organization: No    Attends meetings of clubs or organizations: Never    Relationship status: Never married  . Intimate partner violence:    Fear of current or ex partner: No    Emotionally abused: No    Physically abused: No    Forced sexual activity: No  Other Topics Concern  . Not on file  Social History Narrative   Patient lives at home with her daughters and she is unemployed. Patient has two children.   Patient has a high school education.   Right handed.    Caffeine two cups daily.    Current Outpatient Medications on File Prior to Visit  Medication Sig Dispense Refill  . albuterol (PROVENTIL HFA;VENTOLIN HFA) 108 (90 Base) MCG/ACT inhaler Inhale 1-2 puffs into the lungs every 6 (six) hours as needed for wheezing or shortness of breath. 1 Inhaler  0  . Cholecalciferol (VITAMIN D PO) Take 500 Units by mouth daily.     . Cyanocobalamin (VITAMIN B 12 PO) Take 1,000 mg by mouth daily.    . cyclobenzaprine (FLEXERIL) 10 MG tablet Take 1 tablet (10 mg total) by mouth 3 (three) times daily as needed for muscle spasms. 30 tablet 0  . Diclofenac Sodium 3 % GEL Apply 1 application topically daily as needed (pain).   1  . DULoxetine (CYMBALTA) 60 MG capsule Take one capsule daily 30 capsule 1  . fluticasone (FLONASE) 50 MCG/ACT nasal spray Place 2 sprays into both nostrils as needed.   1  . folic acid (FOLVITE) 518 MCG tablet Take 400 mcg by mouth daily. Take 2 pills in am and 2 pills at night    . gabapentin (NEURONTIN) 300 MG capsule Take 1 capsule (300 mg total) by mouth 3 (three) times daily. 90 capsule 5  . glucose blood (TRUE METRIX BLOOD GLUCOSE TEST) test strip Use as instructed 100 each 12  . hydrochlorothiazide (HYDRODIURIL) 25 MG tablet Take 1 tablet (25 mg total) by mouth daily. 90 tablet 3  . hydrOXYzine (ATARAX/VISTARIL) 25 MG tablet Take 1 tablet (25 mg total) by mouth 3 (three) times daily as needed. 90 tablet 1  . imiquimod (ALDARA) 5 % cream Apply topically 3 (three) times a week. 12 each 3  . Lancets (ONETOUCH ULTRASOFT) lancets Use as instructed 100 each 12  . lisinopril (PRINIVIL,ZESTRIL) 20 MG tablet Take 1 tablet (20 mg total) by mouth daily. 30 tablet 2  . lovastatin (MEVACOR) 20 MG tablet Take 1 tablet (20 mg total) by mouth at bedtime. 90 tablet 3  . meloxicam (MOBIC) 15 MG tablet Take 1 tablet (15 mg total) by mouth daily. 30 tablet 3  . Omega-3 Fatty Acids (FISH OIL PO) Take 360 mg by mouth. Take 2 pills at bedtime    . oxyCODONE (OXY IR/ROXICODONE) 5 MG immediate release tablet TAKE 1 (ONE) TABLET THREE TIMES DAILY, AS NEEDED    . SUMAtriptan (IMITREX) 25 MG tablet Take 1 tablet (25 mg total) by mouth daily. May take one more tablet two hours after the first. No more than two tablets per day. 30 tablet 1  . topiramate  (TOPAMAX) 50 MG tablet TAKE 2 TABLETS AT NIGHT AND 1 TABLET 12 HOURS AFTER DAILY. 90 tablet 1   No current facility-administered medications on file prior to visit.     No Known Allergies  Family History  Problem Relation Age of Onset  . Heart Problems Father   .  Diabetes Father   . Hypertension Other   . Heart disease Other   . Stroke Sister   . Kidney disease Brother      Review of Systems She denies recent hypoglycemia.      Objective:   Physical Exam  Lab Results  Component Value Date   CREATININE 0.78 01/28/2018   BUN 5 (L) 01/28/2018   NA 142 01/28/2018   K 3.1 (L) 01/28/2018   CL 103 01/28/2018   CO2 22 01/28/2018       Assessment & Plan:  Insulin-requiring type 2 DM, with PN: she needs increased rx. She declines a1c  Patient Instructions  check your blood sugar twice a day.  vary the time of day when you check, between before the 3 meals, and at bedtime.  also check if you have symptoms of your blood sugar being too high or too low.  please keep a record of the readings and bring it to your next appointment here (or you can bring the meter itself).  You can write it on any piece of paper.  please call us sooner if your blood sugar goes below 70, or if you have a lot of readings over 200.    Please increase the insulin to 150 units each morning.   Please come back for a follow-up appointment in 2-3 months.

## 2018-07-18 NOTE — Patient Instructions (Addendum)
check your blood sugar twice a day.  vary the time of day when you check, between before the 3 meals, and at bedtime.  also check if you have symptoms of your blood sugar being too high or too low.  please keep a record of the readings and bring it to your next appointment here (or you can bring the meter itself).  You can write it on any piece of paper.  please call us sooner if your blood sugar goes below 70, or if you have a lot of readings over 200.    Please increase the insulin to 150 units each morning.   Please come back for a follow-up appointment in 2-3 months.

## 2018-07-31 ENCOUNTER — Other Ambulatory Visit: Payer: Self-pay | Admitting: Family Medicine

## 2018-07-31 DIAGNOSIS — G894 Chronic pain syndrome: Secondary | ICD-10-CM

## 2018-08-01 ENCOUNTER — Other Ambulatory Visit: Payer: Self-pay

## 2018-08-01 ENCOUNTER — Ambulatory Visit (INDEPENDENT_AMBULATORY_CARE_PROVIDER_SITE_OTHER): Payer: Medicaid Other | Admitting: Licensed Clinical Social Worker

## 2018-08-01 DIAGNOSIS — F411 Generalized anxiety disorder: Secondary | ICD-10-CM

## 2018-08-01 DIAGNOSIS — F331 Major depressive disorder, recurrent, moderate: Secondary | ICD-10-CM

## 2018-08-01 NOTE — Progress Notes (Signed)
Virtual Visit via Telephone Note  I connected with Catherine Pope on 08/01/18 at  2:00 PM EDT by telephone and verified that I am speaking with the correct person using two identifiers. Client unable to use video feature due to reported trouble with internet connectivity.   I discussed the limitations, risks, security and privacy concerns of performing an evaluation and management service by telephone and the availability of in person appointments. I also discussed with the patient that there may be a patient responsible charge related to this service. The patient expressed understanding and agreed to proceed.   I discussed the assessment and treatment plan with the patient. The patient was provided an opportunity to ask questions and all were answered. The patient agreed with the plan and demonstrated an understanding of the instructions.   The patient was advised to call back or seek an in-person evaluation if the symptoms worsen or if the condition fails to improve as anticipated.  I provided 45 minutes of non-face-to-face time during this encounter.   Olegario Messier, LCSW    THERAPIST PROGRESS NOTE  Session Time: 2pm-2:46pm  Participation Level: Active  Behavioral Response: NAAlertAnxious and Depressed  Type of Therapy: Individual Therapy  Treatment Goals addressed: Anxiety and Coping  Interventions: CBT, Motivational Interviewing and Supportive  Summary: Catherine Pope is a 53 y.o. female who presents with increasing intensity of anxiety and depression. Client verbalized increase in depressive symptoms, grief related to the death of her aunt and taking care of her mother, poor sleep, and poor appreciate resulting in weight loss and not taking medication consistently. Client processed feelings of grief and clinician. Client agreed that having no time for herself and people always needing something could be increasing her depressive symptpoms as she has not had time for self  care, and additionally being tired from being anxious all of the time related to the pandemic. Client denies SI/HI/psychosis.   Suicidal/Homicidal: Nowithout intent/plan  Therapist Response: Client checked in with client, assessing for SI/HI/psychosis and overall level of functioning. Clinician actively listened to client, validating and processing feelings of depression and grief.   Plan: Return again in 1-2 weeks.  Diagnosis: Axis I: Generalized Anxiety Disorder and Major Depression, Recurrent severe     Olegario Messier, LCSW 08/01/2018

## 2018-08-08 ENCOUNTER — Other Ambulatory Visit: Payer: Self-pay

## 2018-08-08 ENCOUNTER — Ambulatory Visit (INDEPENDENT_AMBULATORY_CARE_PROVIDER_SITE_OTHER): Payer: Medicaid Other | Admitting: Licensed Clinical Social Worker

## 2018-08-08 DIAGNOSIS — F411 Generalized anxiety disorder: Secondary | ICD-10-CM

## 2018-08-08 DIAGNOSIS — F331 Major depressive disorder, recurrent, moderate: Secondary | ICD-10-CM

## 2018-08-08 NOTE — Progress Notes (Signed)
Virtual Visit via Telephone Note  I connected with ZALAYAH PIZZUTO on 08/08/18 at 12:00 PM EDT by telephone and verified that I am speaking with the correct person using two identifiers.   I discussed the limitations, risks, security and privacy concerns of performing an evaluation and management service by telephone and the availability of in person appointments. I also discussed with the patient that there may be a patient responsible charge related to this service. The patient expressed understanding and agreed to proceed.  I discussed the assessment and treatment plan with the patient. The patient was provided an opportunity to ask questions and all were answered. The patient agreed with the plan and demonstrated an understanding of the instructions.   The patient was advised to call back or seek an in-person evaluation if the symptoms worsen or if the condition fails to improve as anticipated.  I provided 45 minutes of non-face-to-face time during this encounter.   Olegario Messier, LCSW    THERAPIST PROGRESS NOTE  Session Time: 12:05pm-12:50pm  Participation Level: Active  Behavioral Response: NAAlertAnxious and Depressed  Type of Therapy: Individual Therapy  Treatment Goals addressed: Coping  Interventions: CBT and Supportive  Summary: SHAYLAH MCGHIE is a 53 y.o. female who presents with reported symptoms of increased anxiety with ruminating thoughts, and depressive symptoms including low motivation and poor sleep. Client shared stressors of not having time for self care, being around the children all the time with limited support, and concerns about family dynamics when receiving help. Client also verbalized concerns about comments her son has made. Client reports she was able to address some of the thoughts with prayer and did allow her daughter to help when daughter noticed client was 'not herself.' Client reports this surprised her, but she was thankful. Client plans to  pick a small project she can complete to gain a feeling of control, and practice self compassion.   Suicidal/Homicidal: Nowithout intent/plan  Therapist Response: Clinician met with client via phone due to devices with videos not being available at her home due to home schooling. Clinician inquired about recent stressors and accepting of help. Clinician encouraged client to make self care moments mandatory. Clinician actively listened and validated client feelings of being overwhelmed with being around the children all of the time as well as worry bout the state opening up' and client not feeling herself or the children will be safe out in the community.  Plan: Return again in 2 weeks.  Diagnosis: Axis I: Generalized Anxiety Disorder and Major Depression, Recurrent severe        Olegario Messier, LCSW 08/08/2018

## 2018-08-20 ENCOUNTER — Other Ambulatory Visit: Payer: Self-pay

## 2018-08-20 ENCOUNTER — Encounter: Payer: Self-pay | Admitting: Family Medicine

## 2018-08-20 ENCOUNTER — Ambulatory Visit (INDEPENDENT_AMBULATORY_CARE_PROVIDER_SITE_OTHER): Payer: Medicaid Other | Admitting: Family Medicine

## 2018-08-20 ENCOUNTER — Telehealth: Payer: Self-pay | Admitting: Family Medicine

## 2018-08-20 DIAGNOSIS — G894 Chronic pain syndrome: Secondary | ICD-10-CM | POA: Diagnosis not present

## 2018-08-20 DIAGNOSIS — N184 Chronic kidney disease, stage 4 (severe): Secondary | ICD-10-CM

## 2018-08-20 DIAGNOSIS — E1122 Type 2 diabetes mellitus with diabetic chronic kidney disease: Secondary | ICD-10-CM | POA: Diagnosis not present

## 2018-08-20 DIAGNOSIS — Z794 Long term (current) use of insulin: Secondary | ICD-10-CM

## 2018-08-20 DIAGNOSIS — I10 Essential (primary) hypertension: Secondary | ICD-10-CM | POA: Diagnosis not present

## 2018-08-20 DIAGNOSIS — E66812 Obesity, class 2: Secondary | ICD-10-CM

## 2018-08-20 DIAGNOSIS — J302 Other seasonal allergic rhinitis: Secondary | ICD-10-CM

## 2018-08-20 DIAGNOSIS — Z09 Encounter for follow-up examination after completed treatment for conditions other than malignant neoplasm: Secondary | ICD-10-CM

## 2018-08-20 DIAGNOSIS — Z6838 Body mass index (BMI) 38.0-38.9, adult: Secondary | ICD-10-CM

## 2018-08-20 MED ORDER — FLUTICASONE PROPIONATE 50 MCG/ACT NA SUSP: 2.0000 | NASAL | 6 refills | Status: DC | PRN

## 2018-08-20 MED ORDER — LISINOPRIL 20 MG PO TABS: 20.0000 mg | ORAL_TABLET | Freq: Every day | ORAL | 6 refills | Status: DC

## 2018-08-20 MED ORDER — HYDROCHLOROTHIAZIDE 25 MG PO TABS: 25.0000 mg | ORAL_TABLET | Freq: Every day | ORAL | 6 refills | Status: DC

## 2018-08-20 MED FILL — FLUTICASONE PROP 50 MCG SPR: 50 | 30 days supply | Qty: 16 | Fill #0

## 2018-08-20 MED FILL — HYDROCHLOROTHIAZIDE 25 MG T: 25 | 90 days supply | Qty: 90 | Fill #0

## 2018-08-20 NOTE — Progress Notes (Signed)
Virtual Visit via Telephone Note  I connected with Catherine Pope on 08/20/18 at 11:00 AM EDT by telephone and verified that I am speaking with the correct person using two identifiers.   I discussed the limitations, risks, security and privacy concerns of performing an evaluation and management service by telephone and the availability of in person appointments. I also discussed with the patient that there may be a patient responsible charge related to this service. The patient expressed understanding and agreed to proceed.   History of Present Illness:  Past Medical History:  Diagnosis Date  . Anxiety   . Depression   . Diabetes mellitus    dx back in 2010  . History of claustrophobia   . 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 Hypertension   . Migraine   . Seasonal allergies   . Sinusitis     Current Outpatient Medications on File Prior to Visit  Medication Sig Dispense Refill  . albuterol (PROVENTIL HFA;VENTOLIN HFA) 108 (90 Base) MCG/ACT inhaler Inhale 1-2 puffs into the lungs every 6 (six) hours as needed for wheezing or shortness of breath. 1 Inhaler 0  . Cholecalciferol (VITAMIN D PO) Take 500 Units by mouth daily.     . Cyanocobalamin (VITAMIN B 12 PO) Take 1,000 mg by mouth daily.    . cyclobenzaprine (FLEXERIL) 10 MG tablet Take 1 tablet (10 mg total) by mouth 3 (three) times daily as needed for muscle spasms. 30 tablet 0  . Diclofenac Sodium 3 % GEL Apply 1 application topically daily as needed (pain).   1  . DULoxetine (CYMBALTA) 60 MG capsule Take one capsule daily 30 capsule 1  . folic acid (FOLVITE) 619 MCG tablet Take 400 mcg by mouth daily. Take 2 pills in am and 2 pills at night    . gabapentin (NEURONTIN) 300 MG capsule Take 1 capsule (300 mg total) by mouth 3 (three) times daily. 90 capsule 5  . glucose blood (TRUE METRIX BLOOD GLUCOSE TEST) test strip Use as instructed 100 each 12  . hydrOXYzine  (ATARAX/VISTARIL) 25 MG tablet Take 1 tablet (25 mg total) by mouth 3 (three) times daily as needed. 90 tablet 1  . Insulin Glargine (LANTUS SOLOSTAR) 100 UNIT/ML Solostar Pen Inject 150 Units into the skin every morning. And pen needles 1/day (Patient taking differently: Inject 120 Units into the skin every morning. And pen needles 1/day) 20 pen 11  . Lancets (ONETOUCH ULTRASOFT) lancets Use as instructed 100 each 12  . lovastatin (MEVACOR) 20 MG tablet Take 1 tablet (20 mg total) by mouth at bedtime. 90 tablet 3  . meloxicam (MOBIC) 15 MG tablet Take 1 tablet (15 mg total) by mouth daily. 30 tablet 3  . Omega-3 Fatty Acids (FISH OIL PO) Take 360 mg by mouth. Take 2 pills at bedtime    . oxyCODONE (OXY IR/ROXICODONE) 5 MG immediate release tablet TAKE 1 (ONE) TABLET THREE TIMES DAILY, AS NEEDED    . SUMAtriptan (IMITREX) 25 MG tablet Take 1 tablet (25 mg total) by mouth daily. May take one more tablet two hours after the first. No more than two tablets per day. 30 tablet 1  . topiramate (TOPAMAX) 50 MG tablet TAKE 2 TABLETS AT NIGHT AND 1 TABLET 12 HOURS AFTER DAILY. 90 tablet 1   No current facility-administered medications on file prior to visit.     Current Status: Since her last office visit, she  is doing well with no complaints. She denies visual changes, chest pain, cough, shortness of breath, heart palpitations, and falls. She has occasional headaches and dizziness with position changes. Denies severe headaches, confusion, seizures, double vision, and blurred vision, nausea and vomiting. Her most recent normal range of preprandial blood glucose levels have been between 110-130. She has seen low range of 76 and high of 200 since his last office visit. She denies fatigue, frequent urination, blurred vision, excessive hunger, excessive thirst, weight gain, weight loss, and poor wound healing. She continues to check her feet regularly. She states that she has [facility]. She  is at [facility] on [facility]. Her anxiety it mild today. She denies suicidal ideations, homicidal ideations, or auditory hallucinations.   She denies fevers, chills, recent infections, weight loss, and night sweats. No reports of GI problems such as diarrhea, and constipation. She has no reports of blood in stools, dysuria and hematuria. She denies pain today.   Observations/Objective:  Telephone Virtual Visit   Assessment and Plan:  1. Essential hypertension She will continue to decrease high sodium intake, excessive alcohol intake, increase potassium intake, smoking cessation, and increase physical activity of at least 30 minutes of cardio activity daily. She will continue to follow Heart Healthy or DASH diet. - hydrochlorothiazide (HYDRODIURIL) 25 MG tablet; Take 1 tablet (25 mg total) by mouth daily.  Dispense: 90 tablet; Refill: 6 - lisinopril (ZESTRIL) 20 MG tablet; Take 1 tablet (20 mg total) by mouth daily.  Dispense: 30 tablet; Refill: 6  2. Chronic pain syndrome Continue to follow up with Pain Clinic as needed.   3. Type 2 diabetes mellitus with stage 4 chronic kidney disease, with long-term current use of insulin ([place]) She will continue to decrease foods/beverages high in sugars and carbs and follow Heart Healthy or DASH diet. Increase physical activity to at least 30 minutes cardio exercise daily.   4. Class 2 severe obesity due to excess calories with serious comorbidity and body mass index (BMI) of 38.0 to 38.9 in adult (HCC) Goal BMI  is <30. Encouraged efforts to reduce weight include engaging in physical activity as tolerated with goal of 150 minutes per week. Improve dietary choices and eat a meal regimen consistent with a Mediterranean or DASH diet. Reduce simple carbohydrates. Do not skip meals and eat healthy snacks throughout the day to avoid over-eating at dinner. Set a goal weight loss that is achievable for you.  5. Seasonal allergies - fluticasone  (FLONASE) 50 MCG/ACT nasal spray; Place 2 sprays into both nostrils as needed.  Dispense: 9.9 g; Refill: 6  Meds ordered this encounter  Medications  . fluticasone (FLONASE) 50 MCG/ACT nasal spray    Sig: Place 2 sprays into both nostrils as needed.    Dispense:  9.9 g    Refill:  6  . hydrochlorothiazide (HYDRODIURIL) 25 MG tablet    Sig: Take 1 tablet (25 mg total) by mouth daily.    Dispense:  90 tablet    Refill:  6  . lisinopril (ZESTRIL) 20 MG tablet    Sig: Take 1 tablet (20 mg total) by mouth daily.    Dispense:  30 tablet    Refill:  6    No orders of the defined types were placed in this encounter.   Referral Orders  No referral(s) requested today    Kathe Becton,  MSN, FNP-C Patient [facility] [facility]  [address] [place], [place] [address] [phone]  Follow Up Instructions:  She will follow up in 6 months.     I discussed the assessment and treatment plan with the patient. The patient was provided an opportunity to ask questions and all were answered. The patient agreed with the plan and demonstrated an understanding of the instructions.   The patient was advised to call back or seek an in-person evaluation if the symptoms worsen or if the condition fails to improve as anticipated.  I provided 20 minutes of non-face-to-face time during this encounter.   Azzie Glatter, FNP

## 2018-08-20 NOTE — Telephone Encounter (Signed)
Message left on voicemail for Telephone Visit scheduled for today at 11:00. Patient to contact office.

## 2018-08-21 ENCOUNTER — Other Ambulatory Visit: Payer: Self-pay

## 2018-08-21 ENCOUNTER — Ambulatory Visit (INDEPENDENT_AMBULATORY_CARE_PROVIDER_SITE_OTHER): Payer: Medicaid Other | Admitting: Licensed Clinical Social Worker

## 2018-08-21 DIAGNOSIS — F331 Major depressive disorder, recurrent, moderate: Secondary | ICD-10-CM

## 2018-08-21 NOTE — Progress Notes (Signed)
Virtual Visit via Telephone Note  I connected with Catherine Pope on 08/21/18 at 12:00 PM EDT by telephone and verified that I am speaking with the correct person using two identifiers.   I discussed the limitations, risks, security and privacy concerns of performing an evaluation and management service by telephone and the availability of in person appointments. I also discussed with the patient that there may be a patient responsible charge related to this service. The patient expressed understanding and agreed to proceed.   I discussed the assessment and treatment plan with the patient. The patient was provided an opportunity to ask questions and all were answered. The patient agreed with the plan and demonstrated an understanding of the instructions.   The patient was advised to call back or seek an in-person evaluation if the symptoms worsen or if the condition fails to improve as anticipated.  I provided 45 minutes of non-face-to-face time during this encounter.   Olegario Messier, LCSW    THERAPIST PROGRESS NOTE  Session Time: 12:10-12:55pm  Participation Level: Active  Behavioral Response: NAAlertEuthymic and Irritable  Type of Therapy: Individual Therapy  Treatment Goals addressed: Coping  Interventions: CBT, Motivational Interviewing and Supportive  Summary: Catherine Pope is a 53 y.o. female who presents with symptoms of depression, anxiety, and irritability. Client reports some improvement in depression and anxiety symptoms as evidence by less isolation and 'exploding' when upset or feeling things are out of control. Client verbalizes increased irritability with a son who continues to be disrespectful and client becomes frustrated when he cannot explain himself. Client notes they will be working on this is his individual therapy. Client provided examples of when she has been able to ask for help from others, despite this being 'out of my comfort zone.' Client shows  progress toward regulating emotions by not focusing on perfection of everything, and requesting others help by making sure to talk to her when she is calm and help stop the conversation if she raises her voice.   Suicidal/Homicidal: Nowithout intent/plan  Therapist Response: Clinician met with client via telehealth to observe social distancing and health and safety measures due to pandemic. Clinician assessed for SI/HI/psychosis. Clinician inquired about changes in stressors and skills attempted. Clinician praised client for use of boundary setting and assertive communication skills. Clinician highlighted client's use of distress tolerance skills and encouraged client to give herself credit for using them, and show self compassion when it did not turn out how she planned. Clinician and client reviewed progress toward client goals and discussed continuing needs.  Plan: Return again in 1-2 weeks.  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

## 2018-08-27 ENCOUNTER — Ambulatory Visit (HOSPITAL_COMMUNITY): Payer: Medicaid Other | Admitting: Licensed Clinical Social Worker

## 2018-09-01 ENCOUNTER — Other Ambulatory Visit (HOSPITAL_COMMUNITY): Payer: Self-pay | Admitting: Psychiatry

## 2018-09-01 DIAGNOSIS — F411 Generalized anxiety disorder: Secondary | ICD-10-CM

## 2018-09-04 ENCOUNTER — Other Ambulatory Visit: Payer: Self-pay | Admitting: Family Medicine

## 2018-09-04 DIAGNOSIS — G894 Chronic pain syndrome: Secondary | ICD-10-CM

## 2018-09-04 MED FILL — LOVASTATIN 20 MG TABS: 20 | 30 days supply | Qty: 30 | Fill #2

## 2018-09-05 ENCOUNTER — Other Ambulatory Visit: Payer: Self-pay | Admitting: Pharmacist

## 2018-09-05 DIAGNOSIS — R519 Headache, unspecified: Secondary | ICD-10-CM

## 2018-09-06 MED ORDER — TOPIRAMATE 50 MG PO TABS: ORAL_TABLET | ORAL | 1 refills | Status: DC

## 2018-09-06 MED FILL — TOPIRAMATE 50 MG TABLET: 50 | 30 days supply | Qty: 90 | Fill #0

## 2018-09-07 ENCOUNTER — Other Ambulatory Visit (HOSPITAL_COMMUNITY): Payer: Self-pay | Admitting: Psychiatry

## 2018-09-07 DIAGNOSIS — F411 Generalized anxiety disorder: Secondary | ICD-10-CM

## 2018-09-07 DIAGNOSIS — F331 Major depressive disorder, recurrent, moderate: Secondary | ICD-10-CM

## 2018-09-09 ENCOUNTER — Ambulatory Visit (INDEPENDENT_AMBULATORY_CARE_PROVIDER_SITE_OTHER): Payer: Medicaid Other | Admitting: Psychiatry

## 2018-09-09 ENCOUNTER — Encounter (HOSPITAL_COMMUNITY): Payer: Self-pay | Admitting: Psychiatry

## 2018-09-09 ENCOUNTER — Other Ambulatory Visit: Payer: Self-pay

## 2018-09-09 DIAGNOSIS — F411 Generalized anxiety disorder: Secondary | ICD-10-CM

## 2018-09-09 DIAGNOSIS — F331 Major depressive disorder, recurrent, moderate: Secondary | ICD-10-CM | POA: Diagnosis not present

## 2018-09-09 MED ORDER — HYDROXYZINE HCL 25 MG PO TABS: 25.0000 mg | ORAL_TABLET | Freq: Four times a day (QID) | ORAL | 1 refills | Status: DC | PRN

## 2018-09-09 MED ORDER — DULOXETINE HCL 60 MG PO CPEP: ORAL_CAPSULE | ORAL | 1 refills | Status: DC

## 2018-09-09 MED FILL — hydrOXYzine HCL 25 MG TABS: 25 | 30 days supply | Qty: 120 | Fill #0

## 2018-09-09 MED FILL — DULoxetine HCL 60 MG CPEP: 60 | 30 days supply | Qty: 30 | Fill #0

## 2018-09-09 NOTE — Progress Notes (Signed)
Virtual Visit via Telephone Note  I connected with Catherine Pope on 09/09/18 at  9:20 AM EDT by telephone and verified that I am speaking with the correct person using two identifiers.   I discussed the limitations, risks, security and privacy concerns of performing an evaluation and management service by telephone and the availability of in person appointments. I also discussed with the patient that there may be a patient responsible charge related to this service. The patient expressed understanding and agreed to proceed.   History of Present Illness: Patient was evaluated through phone session.  Despite increasing hydroxyzine she is still experiencing anxiety and nervousness.  She feels nervous due to COVID-19 as she does not leave the house.  She lives with her daughter and 3 adopted children.  However she feels the Cymbalta helping her depression and irritability.  She denies any crying spells, mood swings, feeling of hopelessness or worthlessness.  She is sleeping at least 7 hours and denies any major panic attack however she like to go up on hydroxyzine to help her anxiety.  She is getting therapy from [place] which is working very well.  She has no tremors, shakes or any EPS.  She denies any psychosis or hallucination.  Her energy level is fair.  She had a good support from her siblings who lives around her.  Patient denies drinking or using any illegal substances.  Patient has diabetes and her next blood work is coming up in few weeks.  Her last hemoglobin A1c is 7.5 which is improved from the previous reading.  Patient is still taking multiple medication for her chronic health issues.    Past Psychiatric History:Reviewed. No H/Oinpatient or any suicidal attempt. Seen at [facility] and given Seroquel, Effexor, BuSpar and Wellbutrin. Effexor cause grogginess, Seroquel cause weight gain increased blood sugar. Did not took Wellbutrin consistently. No H/Omania or psychosis.   Psychiatric  Specialty Exam: Physical Exam  ROS  There were no vitals taken for this visit.There is no height or weight on file to calculate BMI.  General Appearance: NA  Eye Contact:  NA  Speech:  Clear and Coherent and Normal Rate  Volume:  Normal  Mood:  Anxious  Affect:  NA  Thought Process:  Goal Directed  Orientation:  Full (Time, Place, and Person)  Thought Content:  Logical  Suicidal Thoughts:  No  Homicidal Thoughts:  No  Memory:  Immediate;   Good Recent;   Good Remote;   Good  Judgement:  Good  Insight:  Good  Psychomotor Activity:  NA  Concentration:  Concentration: Good and Attention Span: Good  Recall:  Good  Fund of Knowledge:  Good  Language:  Good  Akathisia:  No  Handed:  Right  AIMS (if indicated):     Assets:  Communication Skills Desire for Improvement Housing Resilience Social Support  ADL's:  Intact  Cognition:  WNL  Sleep:   7 hrs      Assessment and Plan: Major depressive disorder, recurrent.  Generalized anxiety disorder.   I reviewed her current medication.  She is taking Topamax, muscle relaxant, narcotic pain medication and gabapentin.  We discussed polypharmacy.  Patient like to try higher dose of hydroxyzine since she feels some time is not helping her anxiety.  We will try hydroxyzine 25 mg 4 times a day however reminded that sometimes these medicine can make her sleepy and groggy and she need to be careful and only take it the fourth 1 as needed.  She  agreed with the plan.  I will continue Cymbalta 60 mg daily.  She has no tremors shakes or any EPS.  Encouraged to continue therapy with [facility] for CBT.  Recommended to call us back if she has any question or any concern.  Follow-up in 2 months with  Follow Up Instructions:    I discussed the assessment and treatment plan with the patient. The patient was provided an opportunity to ask questions and all were answered. The patient agreed with the plan and demonstrated an understanding of the  instructions.   The patient was advised to call back or seek an in-person evaluation if the symptoms worsen or if the condition fails to improve as anticipated.  I provided 20 minutes of non-face-to-face time during this encounter.   Kathlee Nations, MD

## 2018-09-19 ENCOUNTER — Ambulatory Visit (HOSPITAL_COMMUNITY): Payer: Medicaid Other | Admitting: Licensed Clinical Social Worker

## 2018-09-23 ENCOUNTER — Ambulatory Visit (INDEPENDENT_AMBULATORY_CARE_PROVIDER_SITE_OTHER): Payer: Medicaid Other | Admitting: Licensed Clinical Social Worker

## 2018-09-23 ENCOUNTER — Other Ambulatory Visit: Payer: Self-pay

## 2018-09-23 DIAGNOSIS — F331 Major depressive disorder, recurrent, moderate: Secondary | ICD-10-CM

## 2018-09-23 DIAGNOSIS — F411 Generalized anxiety disorder: Secondary | ICD-10-CM | POA: Diagnosis not present

## 2018-09-26 ENCOUNTER — Other Ambulatory Visit: Payer: Self-pay

## 2018-09-30 ENCOUNTER — Other Ambulatory Visit: Payer: Self-pay

## 2018-09-30 ENCOUNTER — Ambulatory Visit (INDEPENDENT_AMBULATORY_CARE_PROVIDER_SITE_OTHER): Payer: Medicaid Other | Admitting: Licensed Clinical Social Worker

## 2018-09-30 ENCOUNTER — Ambulatory Visit: Payer: Self-pay | Admitting: Endocrinology

## 2018-09-30 DIAGNOSIS — F411 Generalized anxiety disorder: Secondary | ICD-10-CM | POA: Diagnosis not present

## 2018-09-30 DIAGNOSIS — F331 Major depressive disorder, recurrent, moderate: Secondary | ICD-10-CM | POA: Diagnosis not present

## 2018-09-30 NOTE — Progress Notes (Signed)
Virtual Visit via Telephone Note  I connected with Catherine Pope on 09/30/18 at  9:00 AM EDT by telephone and verified that I am speaking with the correct person using two identifiers.   I discussed the limitations, risks, security and privacy concerns of performing an evaluation and management service by telephone and the availability of in person appointments. I also discussed with the patient that there may be a patient responsible charge related to this service. The patient expressed understanding and agreed to proceed.  I discussed the assessment and treatment plan with the patient. The patient was provided an opportunity to ask questions and all were answered. The patient agreed with the plan and demonstrated an understanding of the instructions.   The patient was advised to call back or seek an in-person evaluation if the symptoms worsen or if the condition fails to improve as anticipated.  I provided 45 minutes of non-face-to-face time during this encounter.   Olegario Messier, LCSW    THERAPIST PROGRESS NOTE  Session Time: 9:05am-9:51am  Participation Level: Active  Behavioral Response: NAAlertAnxious and Dysphoric  Type of Therapy: Individual Therapy  Treatment Goals addressed: Coping  Interventions: CBT, Motivational Interviewing and Supportive  Summary: Catherine Pope is a 53 y.o. female who presents with frustration related to family behaviors. Client verbalizes understanding of need to not take on the problems of others as her own, and can provide examples of when she did not. Client continues to struggle with feelings related to things out of control which she would like to have control.   Suicidal/Homicidal: Nowithout intent/plan  Therapist Response: Clinician assessed client for SI/HI/psychosis and overall level of wellbeing. Clinician inquired about recent changes in stressors and available supports client is utilizing. Clinician challenges client to focus on  what can be controlled and utilize Wise mind rather than focusing on emotions and lack of control.Clinician validated client wanting 'the best' for her children and the discomfort of having to watch them make decisions with which she does not agree.  Plan: Return again in 1-2 weeks.  Diagnosis: Axis I: Generalized Anxiety Disorder and Major Depressive Disorder       Olegario Messier, LCSW 09/30/2018

## 2018-10-01 NOTE — Progress Notes (Signed)
Virtual Visit via Telephone Note  I connected with Catherine Pope on 09/23/2018 at 10:00 AM EDT by telephone and verified that I am speaking with the correct person using two identifiers.   I discussed the limitations, risks, security and privacy concerns of performing an evaluation and management service by telephone and the availability of in person appointments. I also discussed with the patient that there may be a patient responsible charge related to this service. The patient expressed understanding and agreed to proceed.  I discussed the assessment and treatment plan with the patient. The patient was provided an opportunity to ask questions and all were answered. The patient agreed with the plan and demonstrated an understanding of the instructions.   The patient was advised to call back or seek an in-person evaluation if the symptoms worsen or if the condition fails to improve as anticipated.  I provided 45 minutes of non-face-to-face time during this encounter.   Olegario Messier, LCSW    THERAPIST PROGRESS NOTE  Session Time: 48:27MB-86:75QG  Participation Level: Active  Behavioral Response: NAAlertAnxious  Type of Therapy: Individual Therapy  Treatment Goals addressed: Coping  Interventions: CBT and Supportive  Summary: Catherine Pope is a 53 y.o. female who presents with anxiety and depression. Client reports she is more able to accept help from others when previously she was worried about judgment. Client shared some family discord with parenting all of the small children in the family and different parenting styles. Client identified accepting help while being sick and trusting family members to help take care of the children, which surprised her.   Suicidal/Homicidal: Nowithout intent/plan  Therapist Response: Clinician checked in with client, assessing for SI/HI/psychosis and overall level of functioning. Clinician inquired about changes in health concerns and  client anxiety related to any changes. Clinician processed with client thoughts and feelings related to giving up control due to being unable to take care of children on her own.  Plan: Return again in 1-2 weeks.  Diagnosis: Axis I: Generalized Anxiety Disorder and Major Depression, Recurrent severe    Olegario Messier, LCSW 09/23/2018

## 2018-10-07 ENCOUNTER — Ambulatory Visit (HOSPITAL_COMMUNITY): Payer: Medicaid Other | Admitting: Licensed Clinical Social Worker

## 2018-10-07 ENCOUNTER — Other Ambulatory Visit: Payer: Self-pay

## 2018-10-07 DIAGNOSIS — F411 Generalized anxiety disorder: Secondary | ICD-10-CM

## 2018-10-08 MED FILL — LOVASTATIN 20 MG TABS: 20 | 30 days supply | Qty: 30 | Fill #3

## 2018-10-08 MED FILL — FLUTICASONE PROP 50 MCG SPR: 50 | 30 days supply | Qty: 16 | Fill #1

## 2018-10-08 MED FILL — DULoxetine HCL 60 MG CPEP: 60 | 30 days supply | Qty: 30 | Fill #1

## 2018-10-08 MED FILL — hydrOXYzine HCL 25 MG TABS: 25 | 30 days supply | Qty: 120 | Fill #1

## 2018-10-11 ENCOUNTER — Other Ambulatory Visit: Payer: Self-pay

## 2018-10-14 ENCOUNTER — Other Ambulatory Visit: Payer: Self-pay

## 2018-10-14 ENCOUNTER — Ambulatory Visit (INDEPENDENT_AMBULATORY_CARE_PROVIDER_SITE_OTHER): Payer: Medicaid Other | Admitting: Licensed Clinical Social Worker

## 2018-10-14 DIAGNOSIS — F411 Generalized anxiety disorder: Secondary | ICD-10-CM

## 2018-10-14 DIAGNOSIS — F331 Major depressive disorder, recurrent, moderate: Secondary | ICD-10-CM

## 2018-10-14 NOTE — Progress Notes (Signed)
Unable to reach client. Voicemail left.

## 2018-10-15 ENCOUNTER — Ambulatory Visit (INDEPENDENT_AMBULATORY_CARE_PROVIDER_SITE_OTHER): Payer: Medicaid Other | Admitting: Endocrinology

## 2018-10-15 ENCOUNTER — Encounter: Payer: Self-pay | Admitting: Endocrinology

## 2018-10-15 ENCOUNTER — Other Ambulatory Visit: Payer: Self-pay

## 2018-10-15 VITALS — BP 148/70 | HR 108 | Ht 66.0 in | Wt 226.6 lb

## 2018-10-15 DIAGNOSIS — E1165 Type 2 diabetes mellitus with hyperglycemia: Secondary | ICD-10-CM | POA: Insufficient documentation

## 2018-10-15 DIAGNOSIS — N184 Chronic kidney disease, stage 4 (severe): Secondary | ICD-10-CM | POA: Diagnosis not present

## 2018-10-15 DIAGNOSIS — E1122 Type 2 diabetes mellitus with diabetic chronic kidney disease: Secondary | ICD-10-CM

## 2018-10-15 DIAGNOSIS — Z794 Long term (current) use of insulin: Secondary | ICD-10-CM

## 2018-10-15 DIAGNOSIS — E1142 Type 2 diabetes mellitus with diabetic polyneuropathy: Secondary | ICD-10-CM | POA: Diagnosis not present

## 2018-10-15 DIAGNOSIS — E119 Type 2 diabetes mellitus without complications: Secondary | ICD-10-CM | POA: Insufficient documentation

## 2018-10-15 LAB — POCT GLYCOSYLATED HEMOGLOBIN (HGB A1C): Hemoglobin A1C: 7.9 % — AB (ref 4.0–5.6)

## 2018-10-15 MED ORDER — LANTUS SOLOSTAR 100 UNIT/ML ~~LOC~~ SOPN: 155.0000 [IU] | PEN_INJECTOR | SUBCUTANEOUS | 11 refills | Status: DC

## 2018-10-15 NOTE — Progress Notes (Signed)
Subjective:    Patient ID: Catherine Pope, female    DOB: 1965/11/12, 53 y.o.   MRN: [id number]  HPI Pt returns for f/u of DM DM type: Insulin-requiring type 2 Dx'ed: 2585 Complications: polyneuropathy Therapy: insulin since 2018 GDM: never DKA: never Severe hypoglycemia: never.   Pancreatitis: never Pancreatic imaging: normal on 2005 CT.   Other: she takes QD insulin, after poor results with multiple daily injections.   Interval history: no cbg record, but states cbg's vary from 69-160.  It is in general lowest in the afternoon.  Pt says she never misses the insulin.  pt states she feels well in general.  She takes 150 units qd.  Pt says she was started on Ozempic, 1 mg q week.   Past Medical History:  Diagnosis Date  . Anxiety   . Depression   . Diabetes mellitus    dx back in 2010  . History of claustrophobia   . 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 Hypertension   . Migraine   . Seasonal allergies   . Sinusitis     Past Surgical History:  Procedure Laterality Date  . ABDOMINAL HYSTERECTOMY     per pt , in her 20's  . ANTERIOR CERVICAL DECOMP/DISCECTOMY FUSION N/A 12/27/2016   Procedure: CERVICAL SIX-SEVEN ANTERIOR CERVICAL DECOMPRESSION/DISCECTOMY FUSION;  Surgeon: Eustace Moore, MD;  Location: [place];  Service: Neurosurgery;  Laterality: N/A;  . ANTERIOR CRUCIATE LIGAMENT REPAIR     right knee  . BREAST EXCISIONAL BIOPSY Left over 20 years ago   benign  . BREAST SURGERY     2 lumps removed at age 52, left breast/ benign  . RADIOLOGY WITH ANESTHESIA Left 12/11/2017   Procedure: MRI cervical spine and left shoulder WITH ANESTHESIA;  Surgeon: Radiologist, Medication, MD;  Location: [place];  Service: Radiology;  Laterality: Left;  . SHOULDER ARTHROSCOPY     right shoulder    Social History   Socioeconomic History  . Marital status: Single    Spouse name: Not on file  . Number of children: 5  . Years of  education: 49 th  . Highest education level: Not on file  Occupational History  . Occupation: unemployed  Social Needs  . Financial resource strain: Very hard  . Food insecurity    Worry: Sometimes true    Inability: Sometimes true  . Transportation needs    Medical: No    Non-medical: No  Tobacco Use  . Smoking status: Current Every Day Smoker    Packs/day: 0.50    Years: 15.00    Pack years: 7.50    Types: Cigarettes  . Smokeless tobacco: Never Used  . Tobacco comment: less than half pack a day.  Substance and Sexual Activity  . Alcohol use: No  . Drug use: No  . Sexual activity: Not Currently  Lifestyle  . Physical activity    Days per week: 0 days    Minutes per session: 0 min  . Stress: Very much  Relationships  . Social Herbalist on phone: More than three times a week    Gets together: Never    Attends religious service: Never    Active member of club or organization: No    Attends meetings of clubs or organizations: Never    Relationship status: Never married  . Intimate partner violence    Fear of current or ex partner:  No    Emotionally abused: No    Physically abused: No    Forced sexual activity: No  Other Topics Concern  . Not on file  Social History Narrative   Patient lives at home with her daughters and she is unemployed. Patient has two children.   Patient has a high school education.   Right handed.    Caffeine two cups daily.    Current Outpatient Medications on File Prior to Visit  Medication Sig Dispense Refill  . albuterol (PROVENTIL HFA;VENTOLIN HFA) 108 (90 Base) MCG/ACT inhaler Inhale 1-2 puffs into the lungs every 6 (six) hours as needed for wheezing or shortness of breath. 1 Inhaler 0  . allopurinol (ZYLOPRIM) 100 MG tablet Take 100 mg by mouth daily.    . Cholecalciferol (VITAMIN D PO) Take 500 Units by mouth daily.     . Cyanocobalamin (VITAMIN B 12 PO) Take 1,000 mg by mouth daily.    . cyclobenzaprine (FLEXERIL) 10 MG  tablet Take 1 tablet (10 mg total) by mouth 3 (three) times daily as needed for muscle spasms. 30 tablet 0  . Diclofenac Sodium 3 % GEL Apply 1 application topically daily as needed (pain).   1  . DULoxetine (CYMBALTA) 60 MG capsule Take one capsule daily 30 capsule 1  . fluticasone (FLONASE) 50 MCG/ACT nasal spray Place 2 sprays into both nostrils as needed. 9.9 g 6  . folic acid (FOLVITE) 659 MCG tablet Take 400 mcg by mouth daily. Take 2 pills in am and 2 pills at night    . gabapentin (NEURONTIN) 300 MG capsule Take 1 capsule (300 mg total) by mouth 3 (three) times daily. 90 capsule 5  . glucose blood (TRUE METRIX BLOOD GLUCOSE TEST) test strip Use as instructed 100 each 12  . hydrochlorothiazide (HYDRODIURIL) 25 MG tablet Take 1 tablet (25 mg total) by mouth daily. 90 tablet 6  . hydrOXYzine (ATARAX/VISTARIL) 25 MG tablet Take 1 tablet (25 mg total) by mouth 4 (four) times daily as needed for anxiety. 120 tablet 1  . Lancets (ONETOUCH ULTRASOFT) lancets Use as instructed 100 each 12  . lisinopril (ZESTRIL) 20 MG tablet Take 1 tablet (20 mg total) by mouth daily. 30 tablet 6  . lovastatin (MEVACOR) 20 MG tablet Take 1 tablet (20 mg total) by mouth at bedtime. 90 tablet 3  . meloxicam (MOBIC) 15 MG tablet Take 1 tablet (15 mg total) by mouth daily. 30 tablet 3  . Omega-3 Fatty Acids (FISH OIL PO) Take 360 mg by mouth. Take 2 pills at bedtime    . oxyCODONE (OXY IR/ROXICODONE) 5 MG immediate release tablet TAKE 1 (ONE) TABLET THREE TIMES DAILY, AS NEEDED    . SUMAtriptan (IMITREX) 25 MG tablet Take 1 tablet (25 mg total) by mouth daily. May take one more tablet two hours after the first. No more than two tablets per day. 30 tablet 1  . topiramate (TOPAMAX) 50 MG tablet TAKE 2 TABLETS AT NIGHT AND 1 TABLET 12 HOURS AFTER DAILY. 90 tablet 1   No current facility-administered medications on file prior to visit.     No Known Allergies  Family History  Problem Relation Age of Onset  . Heart  Problems Father   . Diabetes Father   . Hypertension Other   . Heart disease Other   . Stroke Sister   . Kidney disease Brother     BP (!) 148/70 (BP Location: Right Arm, Patient Position: Sitting, Cuff Size: Large)   Pulse Marland Kitchen)  108   Ht 5\' 6"  (1.676 m)   Wt 226 lb 9.6 oz (102.8 kg)   SpO2 94%   BMI 36.57 kg/m   Review of Systems Denies LOC.      Objective:   Physical Exam VITAL SIGNS:  See vs page GENERAL: no distress Pulses: dorsalis pedis intact bilat.   MSK: no deformity of the feet CV: no leg edema Skin:  no ulcer on the feet.  normal color and temp on the feet. Neuro: sensation is intact to touch on the feet.  Lab Results  Component Value Date   HGBA1C 7.9 (A) 10/15/2018        Assessment & Plan:  Insulin-requiring type 2 DM, with PN: worse.  Hypoglycemia: this limits aggressiveness of glycemic control.  Therefore, we'll have to increase insulin slowly  Patient Instructions  check your blood sugar twice a day.  vary the time of day when you check, between before the 3 meals, and at bedtime.  also check if you have symptoms of your blood sugar being too high or too low.  please keep a record of the readings and bring it to your next appointment here (or you can bring the meter itself).  You can write it on any piece of paper.  please call us sooner if your blood sugar goes below 70, or if you have a lot of readings over 200.    Please increase the insulin to 155 units each morning.   Please come back for a follow-up appointment in 3 months.

## 2018-10-15 NOTE — Patient Instructions (Addendum)
check your blood sugar twice a day.  vary the time of day when you check, between before the 3 meals, and at bedtime.  also check if you have symptoms of your blood sugar being too high or too low.  please keep a record of the readings and bring it to your next appointment here (or you can bring the meter itself).  You can write it on any piece of paper.  please call us sooner if your blood sugar goes below 70, or if you have a lot of readings over 200.    Please increase the insulin to 155 units each morning.   Please come back for a follow-up appointment in 3 months.

## 2018-10-16 MED ORDER — OZEMPIC (1 MG/DOSE) 2 MG/1.5ML ~~LOC~~ SOPN: 1.0000 mg | PEN_INJECTOR | SUBCUTANEOUS | 11 refills | Status: DC

## 2018-10-18 NOTE — Progress Notes (Signed)
Virtual Visit via Telephone Note  I connected with Catherine Pope on 10/14/2018 at 10:00 AM EDT by telephone and verified that I am speaking with the correct person using two identifiers.   I discussed the limitations, risks, security and privacy concerns of performing an evaluation and management service by telephone and the availability of in person appointments. I also discussed with the patient that there may be a patient responsible charge related to this service. The patient expressed understanding and agreed to proceed.  I discussed the assessment and treatment plan with the patient. The patient was provided an opportunity to ask questions and all were answered. The patient agreed with the plan and demonstrated an understanding of the instructions.   The patient was advised to call back or seek an in-person evaluation if the symptoms worsen or if the condition fails to improve as anticipated.  I provided 45 minutes of non-face-to-face time during this encounter.   Olegario Messier, LCSW    THERAPIST PROGRESS NOTE  Session Time: 10am-10:45am  Participation Level: Active  Behavioral Response: NAAlertAnxious and Depressed  Type of Therapy: Individual Therapy  Treatment Goals addressed: Coping  Interventions: CBT, Motivational Interviewing and Supportive  Summary: Catherine Pope is a 53 y.o. female who presents with symptoms of depression and anxiety with goal of management of symptoms. Client shared recent stressors of family dynamics and related thoughts and feelings, linking these with core values she previously identified. Client acknowledges she talked about her family 'a lot' this day to avoid questions related to her own thoughts, feelings and responses. Client notes maintaining boundaries with her daughter who will need to moe out at the end of the month. Client verbalized continued frustration of being forced into the role of caregiver for other children in the family  without be asked or receiving help in return. Client is concerned about increasing depressive symptoms and desire to isolate, which she has previously decreased. Client agrees to take time for herself daily and allow herself to go and be in her home, not in her bedroom, for one hour twice a week.   Suicidal/Homicidal: Nowithout intent/plan  Therapist Response: Clinician checked in with client, assessing for SI/HI/psychosis and overall level of functioning. Clinician processed with client feelings of disrespect from family members and effect on her behaviors. Clinician developed discrepancies between client desires and behaviors which are counter to her initial goals.   Plan: Return again in 1-2 weeks.  Diagnosis: Axis I: Generalized Anxiety Disorder and Major Depression, Recurrent severe    Olegario Messier, LCSW 10/14/2018

## 2018-10-21 ENCOUNTER — Ambulatory Visit (INDEPENDENT_AMBULATORY_CARE_PROVIDER_SITE_OTHER): Payer: Medicaid Other | Admitting: Licensed Clinical Social Worker

## 2018-10-21 DIAGNOSIS — F411 Generalized anxiety disorder: Secondary | ICD-10-CM | POA: Diagnosis not present

## 2018-10-21 DIAGNOSIS — F331 Major depressive disorder, recurrent, moderate: Secondary | ICD-10-CM | POA: Diagnosis not present

## 2018-10-25 NOTE — Progress Notes (Signed)
Virtual Visit via Telephone Note  I connected with Catherine Pope on 10/21/2018 at 10:00 AM EDT by telephone and verified that I am speaking with the correct person using two identifiers.   I discussed the limitations, risks, security and privacy concerns of performing an evaluation and management service by telephone and the availability of in person appointments. I also discussed with the patient that there may be a patient responsible charge related to this service. The patient expressed understanding and agreed to proceed.  I discussed the assessment and treatment plan with the patient. The patient was provided an opportunity to ask questions and all were answered. The patient agreed with the plan and demonstrated an understanding of the instructions.   The patient was advised to call back or seek an in-person evaluation if the symptoms worsen or if the condition fails to improve as anticipated.  I provided 45 minutes of non-face-to-face time during this encounter.   Olegario Messier, LCSW    THERAPIST PROGRESS NOTE  Session Time: 10am-10:45am  Participation Level: Active  Behavioral Response: NAAlertAnxious  Type of Therapy: Individual Therapy  Treatment Goals addressed: Anxiety, Coping and Diagnosis: Major Depressive Disorder with Anxious features  Interventions: CBT, DBT and Supportive  Summary: Catherine Pope is a 53 y.o. female who presents with generalized anxiety and increased symptoms of depression. Client shares struggles with family members and their judgements. Client identifies thoughts and feelings related to incident. Client processes link between comments, thoughts, and her view of self compared to other family members and their levels of disrespect.Client worked with clinician to focus on how and what mindfulness skills rather than the focus on the 'why' behind people's behaviors.   Suicidal/Homicidal: Nowithout intent/plan  Therapist Response: Clinician  assesses for SI/HI/psychosis and overall level of functioning. Clinician actively listens, providing summarizing statements validating responses. Clinician encourages client to continue identifying 'non surface' thoughts behind frustrated feelings, linking with core values and personal needs. Clinician praises client plan to state and maintain boundaries for watching others' children to maintain her health and sanity.  Plan: Return again in 1-2 weeks.  Diagnosis: Axis I: Generalized Anxiety Disorder and Major Depression, Recurrent severe        Olegario Messier, LCSW 10/21/18

## 2018-10-28 ENCOUNTER — Other Ambulatory Visit: Payer: Self-pay

## 2018-10-28 ENCOUNTER — Ambulatory Visit (HOSPITAL_COMMUNITY): Payer: Medicaid Other | Admitting: Licensed Clinical Social Worker

## 2018-10-28 ENCOUNTER — Telehealth (HOSPITAL_COMMUNITY): Payer: Self-pay | Admitting: Licensed Clinical Social Worker

## 2018-10-28 MED FILL — LISINOPRIL 20 MG TABLET: 20 | 30 days supply | Qty: 30 | Fill #0

## 2018-10-31 ENCOUNTER — Ambulatory Visit (INDEPENDENT_AMBULATORY_CARE_PROVIDER_SITE_OTHER): Payer: Medicaid Other | Admitting: Licensed Clinical Social Worker

## 2018-10-31 ENCOUNTER — Other Ambulatory Visit: Payer: Self-pay

## 2018-10-31 DIAGNOSIS — F331 Major depressive disorder, recurrent, moderate: Secondary | ICD-10-CM

## 2018-10-31 DIAGNOSIS — F411 Generalized anxiety disorder: Secondary | ICD-10-CM

## 2018-10-31 NOTE — Progress Notes (Signed)
Virtual Visit via Telephone Note  I connected with Catherine Pope on 10/31/18 at 11:00 AM EDT by telephone and verified that I am speaking with the correct person using two identifiers.   I discussed the limitations, risks, security and privacy concerns of performing an evaluation and management service by telephone and the availability of in person appointments. I also discussed with the patient that there may be a patient responsible charge related to this service. The patient expressed understanding and agreed to proceed.  I discussed the assessment and treatment plan with the patient. The patient was provided an opportunity to ask questions and all were answered. The patient agreed with the plan and demonstrated an understanding of the instructions.   The patient was advised to call back or seek an in-person evaluation if the symptoms worsen or if the condition fails to improve as anticipated.  I provided *45 minutes of non-face-to-face time during this encounter.   Olegario Messier, LCSW    THERAPIST PROGRESS NOTE  Session Time: 11am-11:45am  Participation Level: Active  Behavioral Response: NAAlertAnxious, Depressed and Irritable  Type of Therapy: Individual Therapy  Treatment Goals addressed: Anxiety, Coping and Diagnosis: Increase use of healthy coping skills to at least 1 time per day at least 4 days per week  Interventions: CBT, Motivational Interviewing and Supportive  Summary: Catherine Pope is a 53 y.o. female who presents with symptoms of anxiety and depression. Client presents on tele-health via phone to maintain social distancing and health and safety protocols. Client verbalizes being upset with herself for feeling like she allowed others to take advantage of her financially and identifies feeing emotionally drained. Client reports she does not want to isolate herself from the family has that has previously not been positive for her mental health. Client processes  thoughts and feelings related to discord with family and results from changing dynamics. Client denies SI/HI and reports improved depression symptoms from previous talk earlier in the week noting 'I couldn't tell my up from down.'   Suicidal/Homicidal: Nowithout intent/plan  Therapist Response: Clinician met with client via telehealth. Clinician assessed for SI/HI/psychosis and overall level of functioning. Clinician processed with client thoughts and feelings related to changing family dynamics and loss of finances she worked to build while feeling used by others.  Plan: Return again in 1-2 weeks.  Diagnosis: Axis I: Generalized Anxiety Disorder and Major Depression, Recurrent severe        Olegario Messier, LCSW 10/31/2018

## 2018-11-04 ENCOUNTER — Ambulatory Visit (HOSPITAL_COMMUNITY): Payer: Medicaid Other | Admitting: Licensed Clinical Social Worker

## 2018-11-04 ENCOUNTER — Other Ambulatory Visit: Payer: Self-pay

## 2018-11-04 DIAGNOSIS — F411 Generalized anxiety disorder: Secondary | ICD-10-CM

## 2018-11-04 DIAGNOSIS — F331 Major depressive disorder, recurrent, moderate: Secondary | ICD-10-CM

## 2018-11-07 NOTE — Progress Notes (Signed)
Catherine Pope is a 53 y.o. female patient scheduled for individual therapy. Clinician was able to connect to client via phone for telehealth however client was unable to complete visit due to family emergency. Client will reschedule. Client denies SI/HI/psychosis.      Olegario Messier, LCSW

## 2018-11-11 ENCOUNTER — Ambulatory Visit (INDEPENDENT_AMBULATORY_CARE_PROVIDER_SITE_OTHER): Payer: Medicaid Other | Admitting: Psychiatry

## 2018-11-11 ENCOUNTER — Encounter (HOSPITAL_COMMUNITY): Payer: Self-pay | Admitting: Psychiatry

## 2018-11-11 ENCOUNTER — Other Ambulatory Visit: Payer: Self-pay

## 2018-11-11 DIAGNOSIS — F411 Generalized anxiety disorder: Secondary | ICD-10-CM

## 2018-11-11 DIAGNOSIS — F331 Major depressive disorder, recurrent, moderate: Secondary | ICD-10-CM | POA: Diagnosis not present

## 2018-11-11 MED ORDER — HYDROXYZINE HCL 25 MG PO TABS: 25.0000 mg | ORAL_TABLET | Freq: Four times a day (QID) | ORAL | 2 refills | Status: DC | PRN

## 2018-11-11 MED ORDER — DULOXETINE HCL 60 MG PO CPEP: ORAL_CAPSULE | ORAL | 2 refills | Status: DC

## 2018-11-11 MED FILL — hydrOXYzine HCL 25 MG TABS: 25 | 90 days supply | Qty: 360 | Fill #0

## 2018-11-11 MED FILL — TOPIRAMATE 50 MG TABLET: 50 | 30 days supply | Qty: 90 | Fill #1

## 2018-11-11 MED FILL — SUMATRIPTAN SUCC 25 MG TAB: 25 | 30 days supply | Qty: 9 | Fill #1

## 2018-11-11 MED FILL — DULoxetine HCL 60 MG CPEP: 60 | 90 days supply | Qty: 90 | Fill #0

## 2018-11-11 NOTE — Progress Notes (Signed)
Virtual Visit via Telephone Note  I connected with Catherine Pope on 11/11/18 at 10:00 AM EDT by telephone and verified that I am speaking with the correct person using two identifiers.   I discussed the limitations, risks, security and privacy concerns of performing an evaluation and management service by telephone and the availability of in person appointments. I also discussed with the patient that there may be a patient responsible charge related to this service. The patient expressed understanding and agreed to proceed.   History of Present Illness: Patient was evaluated through phone session.  On her last visit we increase hydroxyzine 25 mg 4 times a day.  She is taking it but still sometimes she feels medicine does not help as much as she feel very anxious, tearfulness.  However she does not want to change her medication because she is already taking too much medication.  She is in therapy with Carissa.  Recently she had a blood work and her hemoglobin A1c is 7.9.  She is tolerating her medication reported no tremors shakes or any EPS.  Since taking the hydroxyzine she does not have any major panic attack.  She is able to sleep better up to 7 hours.  She feels anxious and worried about the future but denies any suicidal thoughts or homicidal thought.  She denies any drinking or using any illegal substances.  She lives with her daughter and 3 adopted children.  She gets some help from her siblings who lives close by.  Patient has multiple health issues and she is taking multiple medication.  She denies any paranoia or any hallucination.  She is trying to lose weight and she noticed last 10 pounds in past few months.    Past Psychiatric History:Reviewed. No H/Oinpatient or any suicidal attempt. Seen at [facility] and given Seroquel, Effexor, BuSpar and Wellbutrin. Effexor cause grogginess, Seroquel cause weight gain increased blood sugar. Did not took Wellbutrin consistently. No H/Omania or  psychosis.  Recent Results (from the past 2160 hour(s))  POCT HgB A1C     Status: Abnormal   Collection Time: 10/15/18 10:43 AM  Result Value Ref Range   Hemoglobin A1C 7.9 (A) 4.0 - 5.6 %   HbA1c POC (<> result, manual entry)     HbA1c, POC (prediabetic range)     HbA1c, POC (controlled diabetic range)       Psychiatric Specialty Exam: Physical Exam  ROS  There were no vitals taken for this visit.There is no height or weight on file to calculate BMI.  General Appearance: NA  Eye Contact:  NA  Speech:  Clear and Coherent and Slow  Volume:  Normal  Mood:  Dysphoric  Affect:  NA  Thought Process:  Goal Directed  Orientation:  Full (Time, Place, and Person)  Thought Content:  Logical  Suicidal Thoughts:  No  Homicidal Thoughts:  No  Memory:  Immediate;   Good Recent;   Good Remote;   Good  Judgement:  Good  Insight:  Good  Psychomotor Activity:  NA  Concentration:  Concentration: Fair and Attention Span: Fair  Recall:  Good  Fund of Knowledge:  Good  Language:  Good  Akathisia:  No  Handed:  Right  AIMS (if indicated):     Assets:  Communication Skills Desire for Improvement Housing Resilience  ADL's:  Intact  Cognition:  WNL  Sleep:   fair      Assessment and Plan: Major depressive disorder, recurrent.  Generalized anxiety disorder.  Reassurance given.  Encouraged to continue therapy with Carissa.  I reviewed her medication and blood work results.  Her last hemoglobin A1c 7.9.  She is taking Topamax, muscle relaxant and pain medication.  Discussed polypharmacy.  Patient is not interested to increase Cymbalta or add any other medication since she is only taking a lot of medication.  Continue Cymbalta 60 mg daily and hydroxyzine 25 mg to take up to 4 times a day to help with anxiety.  Patient admitted sometimes she does not take the hydroxyzine and encouraged to remain compliant for better efficacy.  Recommended to call us back if she has any question or any concern.   Follow-up in 3 months.  Follow Up Instructions:    I discussed the assessment and treatment plan with the patient. The patient was provided an opportunity to ask questions and all were answered. The patient agreed with the plan and demonstrated an understanding of the instructions.   The patient was advised to call back or seek an in-person evaluation if the symptoms worsen or if the condition fails to improve as anticipated.  I provided 20 minutes of non-face-to-face time during this encounter.   Kathlee Nations, MD

## 2018-11-18 MED FILL — FLUTICASONE PROP 50 MCG SPR: 50 | 30 days supply | Qty: 16 | Fill #2

## 2018-11-19 ENCOUNTER — Other Ambulatory Visit: Payer: Self-pay | Admitting: Pharmacist

## 2018-11-19 DIAGNOSIS — E785 Hyperlipidemia, unspecified: Secondary | ICD-10-CM

## 2018-11-19 NOTE — Telephone Encounter (Signed)
Pt last seen by Lanelle Bal 08/20/18. [facility] pharmacy requesting lovastatin rx - will send request to [facility] as she is outside of my protocol.

## 2018-11-20 ENCOUNTER — Other Ambulatory Visit: Payer: Self-pay

## 2018-11-20 ENCOUNTER — Other Ambulatory Visit: Payer: Self-pay | Admitting: Family Medicine

## 2018-11-20 ENCOUNTER — Ambulatory Visit (INDEPENDENT_AMBULATORY_CARE_PROVIDER_SITE_OTHER): Payer: Medicaid Other | Admitting: Licensed Clinical Social Worker

## 2018-11-20 DIAGNOSIS — F331 Major depressive disorder, recurrent, moderate: Secondary | ICD-10-CM | POA: Diagnosis not present

## 2018-11-20 DIAGNOSIS — F411 Generalized anxiety disorder: Secondary | ICD-10-CM

## 2018-11-20 DIAGNOSIS — E785 Hyperlipidemia, unspecified: Secondary | ICD-10-CM

## 2018-11-20 MED ORDER — LOVASTATIN 20 MG PO TABS: 20.0000 mg | ORAL_TABLET | Freq: Every day | ORAL | 1 refills | Status: DC

## 2018-11-20 MED FILL — LOVASTATIN 20 MG TABS: 20 | 90 days supply | Qty: 90 | Fill #0

## 2018-11-20 NOTE — Progress Notes (Signed)
Virtual Visit via Telephone Note  I connected with Catherine Pope on 11/20/18 at  9:00 AM EDT by telephone and verified that I am speaking with the correct person using two identifiers.   I discussed the limitations, risks, security and privacy concerns of performing an evaluation and management service by telephone and the availability of in person appointments. I also discussed with the patient that there may be a patient responsible charge related to this service. The patient expressed understanding and agreed to proceed.    I discussed the assessment and treatment plan with the patient. The patient was provided an opportunity to ask questions and all were answered. The patient agreed with the plan and demonstrated an understanding of the instructions.   The patient was advised to call back or seek an in-person evaluation if the symptoms worsen or if the condition fails to improve as anticipated.  I provided 45 minutes of non-face-to-face time during this encounter.   Olegario Messier, LCSW    THERAPIST PROGRESS NOTE  Session Time: 9am-9:45am  Participation Level: Active  Behavioral Response: CasualAlertAnxious and Depressed  Type of Therapy: Individual Therapy  Treatment Goals addressed: Coping  Interventions: CBT, Motivational Interviewing and Supportive  Summary: Catherine Pope is a 53 y.o. female who presents with increased symptoms of anxiety and depression including lack of motivation, changes in appetite and sleep, low mood more days than not. Client denies active or passive suicidal thoughts but does often want to 'run away' with her children to avoid judgement and interaction with family. Client notes concerns with how her doctor is responding to her health concerns which she sees as improving. Client shares changes in family dynamics and feeling others are not listening to her and 'telling lies' despite her trying to assist other family members with taking care of  children.   Suicidal/Homicidal: Nowithout intent/plan  Therapist Response: Clinician met with client via telehealth, assessing for SI/HI/psychosis and overall level of functioning. Clinical inquired about changes in family stressors. Clinician and group members discussed levels of depression and anxiety which client verbalized as increased. Clinician actively listened to client, validating feelings and concerns with feeling used and dismissed by her family. Client would like to meet weekly and would benefit from weekly therapy. Clinician provided with crisis resources for use when clinician out of office.  Plan: Return again in 2 weeks.  Diagnosis: Axis I: Generalized Anxiety Disorder and Major Depression, Recurrent severe      Olegario Messier, LCSW 11/20/2018

## 2018-12-03 MED FILL — LISINOPRIL 20 MG TABLET: 20 | 30 days supply | Qty: 30 | Fill #1

## 2018-12-03 MED FILL — HYDROCHLOROTHIAZIDE 25 MG T: 25 | 90 days supply | Qty: 90 | Fill #1

## 2018-12-24 ENCOUNTER — Ambulatory Visit (INDEPENDENT_AMBULATORY_CARE_PROVIDER_SITE_OTHER): Payer: Medicaid Other | Admitting: Licensed Clinical Social Worker

## 2018-12-24 ENCOUNTER — Other Ambulatory Visit: Payer: Self-pay

## 2018-12-24 DIAGNOSIS — F411 Generalized anxiety disorder: Secondary | ICD-10-CM | POA: Diagnosis not present

## 2018-12-24 DIAGNOSIS — F331 Major depressive disorder, recurrent, moderate: Secondary | ICD-10-CM | POA: Diagnosis not present

## 2018-12-24 NOTE — Progress Notes (Signed)
Virtual Visit via Telephone Note  I connected with Catherine Pope on 12/24/18 at  9:00 AM EDT by telephone and verified that I am speaking with the correct person using two identifiers.   I discussed the limitations, risks, security and privacy concerns of performing an evaluation and management service by telephone and the availability of in person appointments. I also discussed with the patient that there may be a patient responsible charge related to this service. The patient expressed understanding and agreed to proceed.   I discussed the assessment and treatment plan with the patient. The patient was provided an opportunity to ask questions and all were answered. The patient agreed with the plan and demonstrated an understanding of the instructions.   The patient was advised to call back or seek an in-person evaluation if the symptoms worsen or if the condition fails to improve as anticipated.  I provided 45 minutes of non-face-to-face time during this encounter.   Olegario Messier, LCSW    THERAPIST PROGRESS NOTE  Session Time: 9am-9:45am  Participation Level: Active  Behavioral Response: NAAlertAnxious and Dysphoric  Type of Therapy: Individual Therapy  Treatment Goals addressed: Anxiety, Coping and Diagnosis: Increase use of healthy coping skills and assertive communication to manage symptoms of anxiety at least 1xdaily at least 4xweekly  Interventions: CBT, Motivational Interviewing and Supportive  Summary: Catherine Pope is a 53 y.o. female who presents with generalized anxiety. Client shared incident with children which she was having self blaming thoughts and doubt about other decisions made. Client stated she enrolled children in therapy based on incident in the home and concern for future behaviors. Client processed with clinician thoughts and feelings related to 'missing something' as a parent in relation to children's lack of honesty. Clinician shared frustration  with family wanting to control her decision making with children despite feeling a lack of support when requested.  Suicidal/Homicidal: Nowithout intent/plan  Therapist Response: Clinician met with client via telehealth due to pandemic and safety precautions. Clinician inquired about changes in stressors and family dynamics. Clinician validated client thoughts and feelings related to incident with children. Clinician inquired about safety and reports made, including gathering contact for PCP and new child therapist. Clinician praised client for enforcing boundaries with mother, maintaining use of assertive communication skills.  Plan: Return again in 1 weeks.  Diagnosis: Axis I: Generalized Anxiety Disorder and Major Depression, Recurrent severe      Olegario Messier, LCSW 12/24/2018

## 2018-12-27 MED FILL — LISINOPRIL 20 MG TABLET: 20 | 30 days supply | Qty: 30 | Fill #2

## 2018-12-27 MED FILL — FLUTICASONE PROP 50 MCG SPR: 50 | 30 days supply | Qty: 16 | Fill #3

## 2018-12-31 ENCOUNTER — Ambulatory Visit (INDEPENDENT_AMBULATORY_CARE_PROVIDER_SITE_OTHER): Payer: Medicaid Other | Admitting: Licensed Clinical Social Worker

## 2018-12-31 ENCOUNTER — Other Ambulatory Visit: Payer: Self-pay

## 2018-12-31 DIAGNOSIS — F411 Generalized anxiety disorder: Secondary | ICD-10-CM | POA: Diagnosis not present

## 2018-12-31 DIAGNOSIS — F331 Major depressive disorder, recurrent, moderate: Secondary | ICD-10-CM

## 2018-12-31 NOTE — Progress Notes (Signed)
Virtual Visit via Telephone Note  I connected with Catherine Pope on 12/31/18 at 10:00 AM EDT by telephone and verified that I am speaking with the correct person using two identifiers.   I discussed the limitations, risks, security and privacy concerns of performing an evaluation and management service by telephone and the availability of in person appointments. I also discussed with the patient that there may be a patient responsible charge related to this service. The patient expressed understanding and agreed to proceed.  I discussed the assessment and treatment plan with the patient. The patient was provided an opportunity to ask questions and all were answered. The patient agreed with the plan and demonstrated an understanding of the instructions.   The patient was advised to call back or seek an in-person evaluation if the symptoms worsen or if the condition fails to improve as anticipated.  I provided 45 minutes of non-face-to-face time during this encounter.   Olegario Messier, LCSW    THERAPIST PROGRESS NOTE  Session Time: 10am-10:45am  Participation Level: Active  Behavioral Response: NAAlertAnxious and Dysphoric  Type of Therapy: Individual Therapy  Treatment Goals addressed: Coping  Interventions: CBT, Motivational Interviewing and Supportive  Summary: Catherine Pope is a 53 y.o. female who presents with symptoms of anxiety and depression. Client shared family interaction from the past week. Client identified set boundaries and was able to identify behaviors which were and were not in her control. Client processed frustration with boundaries not being respected by family members and attempted to problem solve ways to strengthen boundaries.  Suicidal/Homicidal: Nowithout intent/plan  Therapist Response: Clinician spoke with client assessing for SI/HI/psychosis and substance use. Clinician and client processed recent family events when family members undermined  previously set boundaries. Client praised client for verbalizing boundaries and attempting to hold others accountable.  Plan: Return again in 1-2 weeks.  Diagnosis: Axis I: Generalized Anxiety Disorder    Olegario Messier, LCSW 12/31/2018

## 2019-01-06 ENCOUNTER — Ambulatory Visit (INDEPENDENT_AMBULATORY_CARE_PROVIDER_SITE_OTHER): Payer: Medicaid Other | Admitting: Licensed Clinical Social Worker

## 2019-01-06 ENCOUNTER — Other Ambulatory Visit: Payer: Self-pay

## 2019-01-06 DIAGNOSIS — F411 Generalized anxiety disorder: Secondary | ICD-10-CM

## 2019-01-06 DIAGNOSIS — F331 Major depressive disorder, recurrent, moderate: Secondary | ICD-10-CM

## 2019-01-13 ENCOUNTER — Ambulatory Visit (HOSPITAL_COMMUNITY): Payer: Medicaid Other | Admitting: Licensed Clinical Social Worker

## 2019-01-13 ENCOUNTER — Other Ambulatory Visit: Payer: Self-pay

## 2019-01-13 ENCOUNTER — Telehealth (HOSPITAL_COMMUNITY): Payer: Self-pay | Admitting: Licensed Clinical Social Worker

## 2019-01-13 DIAGNOSIS — F411 Generalized anxiety disorder: Secondary | ICD-10-CM

## 2019-01-13 NOTE — Progress Notes (Signed)
  Virtual Visit via Telephone Note  I connected with Catherine Pope on 01/13/19 at 10:00 AM EDT by telephone and verified that I am speaking with the correct person using two identifiers.   I discussed the limitations, risks, security and privacy concerns of performing an evaluation and management service by telephone and the availability of in person appointments. I also discussed with the patient that there may be a patient responsible charge related to this service. The patient expressed understanding and agreed to proceed.  I discussed the assessment and treatment plan with the patient. The patient was provided an opportunity to ask questions and all were answered. The patient agreed with the plan and demonstrated an understanding of the instructions.   The patient was advised to call back or seek an in-person evaluation if the symptoms worsen or if the condition fails to improve as anticipated.  I provided 45 minutes of non-face-to-face time during this encounter.   Olegario Messier, LCSW   THERAPIST PROGRESS NOTE  Session Time: 10am-10:45am  Participation Level: Active  Behavioral Response: NAAlertAnxious and Irritable  Type of Therapy: Individual Therapy  Treatment Goals addressed: Anxiety and Coping  Interventions: CBT, Motivational Interviewing and Supportive  Summary: Catherine Pope is a 53 y.o. female who presents with symptoms of anxiety and depression. Client shares continued worry about family's influence on her mood and lack of support for caring for the children. Client processed desire to move away from family however lacking financial resources. Client is receptive to speaking with housing authority to see options for support. Client verbalized concern about possible DSS involvement and how this would impact the family. Client engaged in developing discrepancies between feeling she is not good enough and listing all the ways she takes care of the children and home.  Client processed with clinician ongoing guilt related to not 'catching' inappropriate behaviors.  Suicidal/Homicidal: Nowithout intent/plan  Therapist Response: Clinician checked in with client, assessing for SI/HI/psychosis and overall level of functioning. Clinician developed discrepancies with clients doubts of being a good caregiver vs list ways children are taken care of. Clinician provided psycho-educational information on how she could be included in trauma therapy with her children. Clinician processed feelings of guilt with client working on identifying and challenging unhelpful thoughts.  Plan: Return again in 1-2 weeks.  Diagnosis: Axis I: Generalized Anxiety Disorder      Olegario Messier, LCSW 01/06/2019

## 2019-01-15 ENCOUNTER — Other Ambulatory Visit: Payer: Self-pay

## 2019-01-15 NOTE — Progress Notes (Signed)
Client unable to meet for scheduled appointment due to children being sick. Client agrees to reschedule. Client denies SI/HI/psychosis.

## 2019-01-17 ENCOUNTER — Encounter: Payer: Self-pay | Admitting: Endocrinology

## 2019-01-17 ENCOUNTER — Other Ambulatory Visit: Payer: Self-pay

## 2019-01-17 ENCOUNTER — Ambulatory Visit (INDEPENDENT_AMBULATORY_CARE_PROVIDER_SITE_OTHER): Payer: Medicaid Other | Admitting: Endocrinology

## 2019-01-17 VITALS — BP 132/80 | HR 105 | Ht 66.0 in | Wt 208.4 lb

## 2019-01-17 DIAGNOSIS — N184 Chronic kidney disease, stage 4 (severe): Secondary | ICD-10-CM

## 2019-01-17 DIAGNOSIS — Z794 Long term (current) use of insulin: Secondary | ICD-10-CM | POA: Diagnosis not present

## 2019-01-17 DIAGNOSIS — E1122 Type 2 diabetes mellitus with diabetic chronic kidney disease: Secondary | ICD-10-CM | POA: Diagnosis not present

## 2019-01-17 LAB — POCT GLYCOSYLATED HEMOGLOBIN (HGB A1C): Hemoglobin A1C: 7.8 % — AB (ref 4.0–5.6)

## 2019-01-17 NOTE — Patient Instructions (Signed)
check your blood sugar twice a day.  vary the time of day when you check, between before the 3 meals, and at bedtime.  also check if you have symptoms of your blood sugar being too high or too low.  please keep a record of the readings and bring it to your next appointment here (or you can bring the meter itself).  You can write it on any piece of paper.  please call us sooner if your blood sugar goes below 70, or if you have a lot of readings over 200.    Please increase the insulin to 155 units each morning.   Please come back for a follow-up appointment in 3 months.   

## 2019-01-17 NOTE — Progress Notes (Signed)
Subjective:    Patient ID: Catherine Pope, female    DOB: 1966/01/02, 53 y.o.   MRN: [id number]  HPI Pt returns for f/u of DM DM type: Insulin-requiring type 2 Dx'ed: 5621 Complications: polyneuropathy Therapy: insulin since 2018, and Ozempic GDM: never DKA: never Severe hypoglycemia: never.   Pancreatitis: never Pancreatic imaging: normal on 2005 CT.   Other: she takes QD insulin, after poor results with multiple daily injections.   Interval history: no cbg record, but states cbg's vary from 75-185. There is no trend throughout the day.  Pt says she never misses the insulin.  pt states she feels well in general.   Past Medical History:  Diagnosis Date  . Anxiety   . Depression   . Diabetes mellitus    dx back in 2010  . History of claustrophobia   . 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 Hypertension   . Migraine   . Seasonal allergies   . Sinusitis     Past Surgical History:  Procedure Laterality Date  . ABDOMINAL HYSTERECTOMY     per pt , in her 20's  . ANTERIOR CERVICAL DECOMP/DISCECTOMY FUSION N/A 12/27/2016   Procedure: CERVICAL SIX-SEVEN ANTERIOR CERVICAL DECOMPRESSION/DISCECTOMY FUSION;  Surgeon: Eustace Moore, MD;  Location: [facility];  Service: Neurosurgery;  Laterality: N/A;  . ANTERIOR CRUCIATE LIGAMENT REPAIR     right knee  . BREAST EXCISIONAL BIOPSY Left over 20 years ago   benign  . BREAST SURGERY     2 lumps removed at age 76, left breast/ benign  . RADIOLOGY WITH ANESTHESIA Left 12/11/2017   Procedure: MRI cervical spine and left shoulder WITH ANESTHESIA;  Surgeon: Radiologist, Medication, MD;  Location: [facility];  Service: Radiology;  Laterality: Left;  . SHOULDER ARTHROSCOPY     right shoulder    Social History   Socioeconomic History  . Marital status: Single    Spouse name: Not on file  . Number of children: 5  . Years of education: 75 th  . Highest education level: Not on file   Occupational History  . Occupation: unemployed  Social Needs  . Financial resource strain: Very hard  . Food insecurity    Worry: Sometimes true    Inability: Sometimes true  . Transportation needs    Medical: No    Non-medical: No  Tobacco Use  . Smoking status: Current Every Day Smoker    Packs/day: 0.50    Years: 15.00    Pack years: 7.50    Types: Cigarettes  . Smokeless tobacco: Never Used  . Tobacco comment: less than half pack a day.  Substance and Sexual Activity  . Alcohol use: No  . Drug use: No  . Sexual activity: Not Currently  Lifestyle  . Physical activity    Days per week: 0 days    Minutes per session: 0 min  . Stress: Very much  Relationships  . Social Herbalist on phone: More than three times a week    Gets together: Never    Attends religious service: Never    Active member of club or organization: No    Attends meetings of clubs or organizations: Never    Relationship status: Never married  . Intimate partner violence    Fear of current or ex partner: No    Emotionally abused: No    Physically abused: No    Forced sexual  activity: No  Other Topics Concern  . Not on file  Social History Narrative   Patient lives at home with her daughters and she is unemployed. Patient has two children.   Patient has a high school education.   Right handed.    Caffeine two cups daily.    Current Outpatient Medications on File Prior to Visit  Medication Sig Dispense Refill  . albuterol (PROVENTIL HFA;VENTOLIN HFA) 108 (90 Base) MCG/ACT inhaler Inhale 1-2 puffs into the lungs every 6 (six) hours as needed for wheezing or shortness of breath. 1 Inhaler 0  . allopurinol (ZYLOPRIM) 100 MG tablet Take 100 mg by mouth daily.    . Cholecalciferol (VITAMIN D PO) Take 500 Units by mouth daily.     . Cyanocobalamin (VITAMIN B 12 PO) Take 1,000 mg by mouth daily.    . cyclobenzaprine (FLEXERIL) 10 MG tablet Take 1 tablet (10 mg total) by mouth 3 (three) times  daily as needed for muscle spasms. 30 tablet 0  . Diclofenac Sodium 3 % GEL Apply 1 application topically daily as needed (pain).   1  . DULoxetine (CYMBALTA) 60 MG capsule Take one capsule daily 30 capsule 2  . fluticasone (FLONASE) 50 MCG/ACT nasal spray Place 2 sprays into both nostrils as needed. 9.9 g 6  . folic acid (FOLVITE) 644 MCG tablet Take 400 mcg by mouth daily. Take 2 pills in am and 2 pills at night    . gabapentin (NEURONTIN) 300 MG capsule Take 1 capsule (300 mg total) by mouth 3 (three) times daily. 90 capsule 5  . glucose blood (TRUE METRIX BLOOD GLUCOSE TEST) test strip Use as instructed 100 each 12  . hydrochlorothiazide (HYDRODIURIL) 25 MG tablet Take 1 tablet (25 mg total) by mouth daily. 90 tablet 6  . hydrOXYzine (ATARAX/VISTARIL) 25 MG tablet Take 1 tablet (25 mg total) by mouth 4 (four) times daily as needed for anxiety. 120 tablet 2  . Insulin Glargine (LANTUS SOLOSTAR) 100 UNIT/ML Solostar Pen Inject 155 Units into the skin every morning. And pen needles 1/day 20 pen 11  . Lancets (ONETOUCH ULTRASOFT) lancets Use as instructed 100 each 12  . lisinopril (ZESTRIL) 20 MG tablet Take 1 tablet (20 mg total) by mouth daily. 30 tablet 6  . lovastatin (MEVACOR) 20 MG tablet Take 1 tablet (20 mg total) by mouth at bedtime. 90 tablet 1  . meloxicam (MOBIC) 15 MG tablet Take 1 tablet (15 mg total) by mouth daily. 30 tablet 3  . Omega-3 Fatty Acids (FISH OIL PO) Take 360 mg by mouth. Take 2 pills at bedtime    . oxyCODONE (OXY IR/ROXICODONE) 5 MG immediate release tablet TAKE 1 (ONE) TABLET THREE TIMES DAILY, AS NEEDED    . Semaglutide, 1 MG/DOSE, (OZEMPIC, 1 MG/DOSE,) 2 MG/1.5ML SOPN Inject 1 mg into the skin once a week. 1 pen 11  . SUMAtriptan (IMITREX) 25 MG tablet Take 1 tablet (25 mg total) by mouth daily. May take one more tablet two hours after the first. No more than two tablets per day. 30 tablet 1  . topiramate (TOPAMAX) 50 MG tablet TAKE 2 TABLETS AT NIGHT AND 1 TABLET  12 HOURS AFTER DAILY. 90 tablet 1   No current facility-administered medications on file prior to visit.     No Known Allergies  Family History  Problem Relation Age of Onset  . Heart Problems Father   . Diabetes Father   . Hypertension Other   . Heart disease Other   .  Stroke Sister   . Kidney disease Brother     BP 132/80 (BP Location: Right Arm, Patient Position: Sitting, Cuff Size: Large)   Pulse (!) 105   Ht 5\' 6"  (1.676 m)   Wt 208 lb 6.4 oz (94.5 kg)   SpO2 99%   BMI 33.64 kg/m    Review of Systems She denies hypoglycemia.      Objective:   Physical Exam VITAL SIGNS:  See vs page GENERAL: no distress Pulses: dorsalis pedis intact bilat.   MSK: no deformity of the feet CV: no leg edema Skin:  no ulcer on the feet.  normal color and temp on the feet. Neuro: sensation is intact to touch on the feet   Lab Results  Component Value Date   HGBA1C 7.8 (A) 01/17/2019       Assessment & Plan:  Insulin-requiring type 2 DM, with PN: she needs increased rx.     Patient Instructions  check your blood sugar twice a day.  vary the time of day when you check, between before the 3 meals, and at bedtime.  also check if you have symptoms of your blood sugar being too high or too low.  please keep a record of the readings and bring it to your next appointment here (or you can bring the meter itself).  You can write it on any piece of paper.  please call us sooner if your blood sugar goes below 70, or if you have a lot of readings over 200.    Please increase the insulin to 155 units each morning.   Please come back for a follow-up appointment in 3 months.

## 2019-01-20 ENCOUNTER — Other Ambulatory Visit: Payer: Self-pay

## 2019-01-20 ENCOUNTER — Ambulatory Visit (INDEPENDENT_AMBULATORY_CARE_PROVIDER_SITE_OTHER): Payer: Medicaid Other | Admitting: Licensed Clinical Social Worker

## 2019-01-20 DIAGNOSIS — F411 Generalized anxiety disorder: Secondary | ICD-10-CM | POA: Diagnosis not present

## 2019-01-20 DIAGNOSIS — F331 Major depressive disorder, recurrent, moderate: Secondary | ICD-10-CM
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
## 2019-01-28 ENCOUNTER — Ambulatory Visit (HOSPITAL_COMMUNITY): Payer: Medicaid Other | Admitting: Licensed Clinical Social Worker

## 2019-01-28 ENCOUNTER — Telehealth (HOSPITAL_COMMUNITY): Payer: Self-pay | Admitting: Licensed Clinical Social Worker

## 2019-01-28 ENCOUNTER — Other Ambulatory Visit: Payer: Self-pay

## 2019-02-07 MED FILL — LISINOPRIL 20 MG TABLET: 20 | 30 days supply | Qty: 30 | Fill #3

## 2019-02-11 ENCOUNTER — Ambulatory Visit (INDEPENDENT_AMBULATORY_CARE_PROVIDER_SITE_OTHER): Payer: Medicaid Other | Admitting: Psychiatry

## 2019-02-11 ENCOUNTER — Other Ambulatory Visit: Payer: Self-pay

## 2019-02-11 ENCOUNTER — Encounter (HOSPITAL_COMMUNITY): Payer: Self-pay | Admitting: Psychiatry

## 2019-02-11 DIAGNOSIS — F331 Major depressive disorder, recurrent, moderate: Secondary | ICD-10-CM

## 2019-02-11 DIAGNOSIS — F411 Generalized anxiety disorder: Secondary | ICD-10-CM | POA: Diagnosis not present

## 2019-02-11 MED ORDER — DULOXETINE HCL 60 MG PO CPEP: ORAL_CAPSULE | ORAL | 2 refills | Status: DC

## 2019-02-11 MED ORDER — HYDROXYZINE HCL 25 MG PO TABS: 25.0000 mg | ORAL_TABLET | Freq: Three times a day (TID) | ORAL | 2 refills | Status: DC

## 2019-02-11 MED FILL — DULoxetine HCL 60 MG CPEP: 60 | 30 days supply | Qty: 30 | Fill #0

## 2019-02-11 MED FILL — hydrOXYzine HCL 25 MG TABS: 25 | 30 days supply | Qty: 90 | Fill #0
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
## 2019-02-21 ENCOUNTER — Ambulatory Visit: Payer: Self-pay | Admitting: Family Medicine

## 2019-02-25 MED FILL — LOVASTATIN 20 MG TABS: 20 | 90 days supply | Qty: 90 | Fill #1

## 2019-03-12 MED FILL — HYDROCHLOROTHIAZIDE 25 MG T: 25 | 90 days supply | Qty: 90 | Fill #2

## 2019-03-12 MED FILL — hydrOXYzine HCL 25 MG TABS: 25 | 30 days supply | Qty: 90 | Fill #1

## 2019-03-12 MED FILL — LISINOPRIL 20 MG TABLET: 20 | 30 days supply | Qty: 30 | Fill #4

## 2019-03-13 ENCOUNTER — Other Ambulatory Visit: Payer: Self-pay

## 2019-03-13 ENCOUNTER — Ambulatory Visit (INDEPENDENT_AMBULATORY_CARE_PROVIDER_SITE_OTHER): Payer: Medicaid Other | Admitting: Licensed Clinical Social Worker

## 2019-03-13 DIAGNOSIS — F331 Major depressive disorder, recurrent, moderate: Secondary | ICD-10-CM | POA: Diagnosis not present

## 2019-03-13 DIAGNOSIS — F411 Generalized anxiety disorder: Secondary | ICD-10-CM
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
## 2019-03-14 ENCOUNTER — Ambulatory Visit (HOSPITAL_COMMUNITY): Payer: Medicaid Other | Admitting: Licensed Clinical Social Worker

## 2019-03-21 ENCOUNTER — Ambulatory Visit (INDEPENDENT_AMBULATORY_CARE_PROVIDER_SITE_OTHER): Payer: Medicaid Other | Admitting: Licensed Clinical Social Worker

## 2019-03-21 ENCOUNTER — Other Ambulatory Visit: Payer: Self-pay

## 2019-03-21 DIAGNOSIS — F411 Generalized anxiety disorder: Secondary | ICD-10-CM

## 2019-03-21 DIAGNOSIS — F331 Major depressive disorder, recurrent, moderate: Secondary | ICD-10-CM | POA: Diagnosis not present

## 2019-03-21 NOTE — Progress Notes (Signed)
Virtual Visit via Telephone Note  I connected with Catherine Pope on 03/21/19 at  9:00 AM EST by telephone and verified that I am speaking with the correct person using two identifiers.   I discussed the limitations, risks, security and privacy concerns of performing an evaluation and management service by telephone and the availability of in person appointments. I also discussed with the patient that there may be a patient responsible charge related to this service. The patient expressed understanding and agreed to proceed.  I discussed the assessment and treatment plan with the patient. The patient was provided an opportunity to ask questions and all were answered. The patient agreed with the plan and demonstrated an understanding of the instructions.   The patient was advised to call back or seek an in-person evaluation if the symptoms worsen or if the condition fails to improve as anticipated.  I provided 45 minutes of non-face-to-face time during this encounter.   Olegario Messier, LCSW    THERAPIST PROGRESS NOTE  Session Time: 905am-9:50am  Participation Level: Active  Behavioral Response: NAAlertAnxious and Dysphoric  Type of Therapy: Individual Therapy  Treatment Goals addressed: Coping and Diagnosis: Increase use of healthy coping skills to at least one time per day at least 5 days per week to manage mood.  Interventions: CBT and Supportive  Summary: Catherine Pope is a 53 y.o. female who presents with symptoms of anxiety and depression. Client processed feelings of lack of support from family and overall feeling of overwhelmed. Client reports being emotionally drained addressing kids appointments and family members not understanding. Client has identified she would like to move away from family to decrease their impact on her emotions and the children. Client states overall she is not able to keep up her diet and self care as well as she previously was however is trying to  be mindful of eating and being on a regular sleep routine. Client is able to identify what she does and does not have control over and is working on focusing one individual task at a time.   Suicidal/Homicidal: Nowithout intent/plan  Therapist Response: Clinician checked in, assessing for SI/HI/psychosis and overall level of functioning. Clinician processes with client radical acceptance related to lack of family support, as previously was effective. Clinician and client discussed options for housing support as client has identified change in location as a priority to address stress. Clinician actively listened to client, providing summarizing and reflecting statements. Clinician praised client identification of feelings and willingness to have difficult conversations with family about her boundaries rather than avoid conflict.  Plan: Return again in 1 weeks.  Diagnosis: Axis I: Generalized Anxiety Disorder and Major Depression, Recurrent severe     Olegario Messier, LCSW 03/21/2019

## 2019-03-26 ENCOUNTER — Ambulatory Visit (INDEPENDENT_AMBULATORY_CARE_PROVIDER_SITE_OTHER): Payer: Medicaid Other | Admitting: Licensed Clinical Social Worker

## 2019-03-26 ENCOUNTER — Other Ambulatory Visit: Payer: Self-pay

## 2019-03-26 DIAGNOSIS — F418 Other specified anxiety disorders: Secondary | ICD-10-CM

## 2019-03-26 NOTE — Progress Notes (Signed)
Virtual Visit via Telephone Note  I connected with Catherine Pope on 03/26/2019 at  9:00 AM EST by telephone and verified that I am speaking with the correct person using two identifiers.   I discussed the limitations, risks, security and privacy concerns of performing an evaluation and management service by telephone and the availability of in person appointments. I also discussed with the patient that there may be a patient responsible charge related to this service. The patient expressed understanding and agreed to proceed.  I discussed the assessment and treatment plan with the patient. The patient was provided an opportunity to ask questions and all were answered. The patient agreed with the plan and demonstrated an understanding of the instructions.   The patient was advised to call back or seek an in-person evaluation if the symptoms worsen or if the condition fails to improve as anticipated.  I provided 45 minutes of non-face-to-face time during this encounter.    A , LCSW    THERAPIST PROGRESS NOTE  Session Time: 9:10am-9:55am  Participation Level: Active  Behavioral Response: NAAlertDysphoric  Type of Therapy: Individual Therapy  Treatment Goals addressed: Coping  Interventions: CBT and Supportive  Summary: Catherine Pope is a 53 y.o. female who presents with symptoms of depression and anxiety. Client shared recent interactions with mother and boundaries maintained. Client shared disappointment in slight weight gain as she had previously identified that as something she was doing for herself. Client shows progress toward goals AEB verbalizing self compassion in response to self critical thoughts.   Suicidal/Homicidal: Nowithout intent/plan  Therapist Response: Clinician met with client, assessing for SI/HI/psychosis and overall level of functioning. Clinician processed with client use of self compassion for unmet goals. Clinician praised client for finding  personal balance of helping mother and not engaging more than necessary. Clinician and client discussed possible options for housing assistance.   Plan: Return again in 1-2 weeks.  Diagnosis: Axis I: Generalized Anxiety Disorder and Major Depression, Recurrent severe        A , LCSW 03/26/2019  

## 2019-04-03 ENCOUNTER — Telehealth (HOSPITAL_COMMUNITY): Payer: Self-pay | Admitting: Licensed Clinical Social Worker

## 2019-04-03 ENCOUNTER — Ambulatory Visit (HOSPITAL_COMMUNITY): Payer: Medicaid Other | Admitting: Licensed Clinical Social Worker

## 2019-04-03 ENCOUNTER — Other Ambulatory Visit: Payer: Self-pay

## 2019-04-04 HISTORY — PX: DENTAL SURGERY: SHX609

## 2019-04-08 ENCOUNTER — Ambulatory Visit (INDEPENDENT_AMBULATORY_CARE_PROVIDER_SITE_OTHER): Payer: Medicaid Other | Admitting: Licensed Clinical Social Worker

## 2019-04-08 ENCOUNTER — Other Ambulatory Visit: Payer: Self-pay

## 2019-04-08 DIAGNOSIS — F411 Generalized anxiety disorder: Secondary | ICD-10-CM

## 2019-04-08 DIAGNOSIS — F331 Major depressive disorder, recurrent, moderate: Secondary | ICD-10-CM | POA: Diagnosis not present

## 2019-04-08 MED FILL — hydrOXYzine HCL 25 MG TABS: 25 | 30 days supply | Qty: 90 | Fill #2

## 2019-04-08 MED FILL — LISINOPRIL 20 MG TABLET: 20 | 30 days supply | Qty: 30 | Fill #5

## 2019-04-08 NOTE — Progress Notes (Signed)
Virtual Visit via Telephone Note  I connected with Catherine Pope on 04/08/19 at  9:00 AM EST by telephone and verified that I am speaking with the correct person using two identifiers.   I discussed the limitations, risks, security and privacy concerns of performing an evaluation and management service by telephone and the availability of in person appointments. I also discussed with the patient that there may be a patient responsible charge related to this service. The patient expressed understanding and agreed to proceed.  I discussed the assessment and treatment plan with the patient. The patient was provided an opportunity to ask questions and all were answered. The patient agreed with the plan and demonstrated an understanding of the instructions.   The patient was advised to call back or seek an in-person evaluation if the symptoms worsen or if the condition fails to improve as anticipated.  I provided 30 minutes of non-face-to-face time during this encounter.    A , LCSW    THERAPIST PROGRESS NOTE  Session Time: 9am-9:30am  Participation Level: Active  Behavioral Response: NAAlertEuthymic  Type of Therapy: Individual Therapy  Treatment Goals addressed: Anxiety and Coping  Interventions: CBT  Summary: Catherine Pope is a 53 y.o. female who presents with symptoms of depression and anxiety. Client reports improvement in symptoms. Client notes still some isolating but is able to leave her room and take care of the home and children's needs. Client notes still some trouble sleeping and is often up late. Client reports she is being more mindful of appropriate eating and hopes to have lost weight at her next doctor appointment, which leads to positive self image and feelings of accomplishment. Client continues to make herself 'less accessible to family members to avoid feeling used. Client denies SI/HI/psychosis.  Suicidal/Homicidal: Nowithout  intent/plan  Therapist Response: Clinician met with client, assessing for SI/HI/psychosis and overall level of functioning. Clinician processed with client changes in holidays and family she made herself available to. Clinician and client discussed the positive changes in returning to care of personal health. Clinician provided supportive, summarizing statements and praise for maintaining boundaries with family.  Plan: Return again in 2-3 weeks.  Diagnosis: Axis I: Generalized Anxiety Disorder and Major Depression, Recurrent severe        A , LCSW 04/08/2019  

## 2019-04-09 MED FILL — DULoxetine HCL 60 MG CPEP: 60 | 60 days supply | Qty: 60 | Fill #1

## 2019-04-18 ENCOUNTER — Other Ambulatory Visit: Payer: Self-pay

## 2019-04-22 ENCOUNTER — Ambulatory Visit: Payer: Medicaid Other | Admitting: Endocrinology

## 2019-04-24 ENCOUNTER — Other Ambulatory Visit: Payer: Self-pay

## 2019-04-24 ENCOUNTER — Ambulatory Visit (INDEPENDENT_AMBULATORY_CARE_PROVIDER_SITE_OTHER): Payer: Medicaid Other | Admitting: Licensed Clinical Social Worker

## 2019-04-24 DIAGNOSIS — F411 Generalized anxiety disorder: Secondary | ICD-10-CM | POA: Diagnosis not present

## 2019-04-24 DIAGNOSIS — F331 Major depressive disorder, recurrent, moderate: Secondary | ICD-10-CM

## 2019-04-24 NOTE — Progress Notes (Signed)
Virtual Visit via Telephone Note  I connected with Catherine Pope on 04/24/19 at 11:00 AM EST by telephone and verified that I am speaking with the correct person using two identifiers.   I discussed the limitations, risks, security and privacy concerns of performing an evaluation and management service by telephone and the availability of in person appointments. I also discussed with the patient that there may be a patient responsible charge related to this service. The patient expressed understanding and agreed to proceed.  I discussed the assessment and treatment plan with the patient. The patient was provided an opportunity to ask questions and all were answered. The patient agreed with the plan and demonstrated an understanding of the instructions.   The patient was advised to call back or seek an in-person evaluation if the symptoms worsen or if the condition fails to improve as anticipated.  I provided 45 minutes of non-face-to-face time during this encounter.   Olegario Messier, LCSW    THERAPIST PROGRESS NOTE  Session Time: 11am-11:45  Participation Level: Active  Behavioral Response: NAAlertEuthymic  Type of Therapy: Individual Therapy  Treatment Goals addressed: Anxiety and Coping  Interventions: CBT  Summary: Catherine Pope is a 54 y.o. female who presents with symptoms of mild depression and generalized anxiety. Client reports being covid positive and recovering still while trying to take care of her children. Client processes recent interactions with family members and response to asking for help. Client reports being proud of willingness to ask for help despite poor response. Client shares thoughts and feelings related to gaining intimacy in relationships. Client processes with clinician core values and deal breakers for healthy relationships   Suicidal/Homicidal: Nowithout intent/plan  Therapist Response: Clinician checked in with client, assessing for  SI/HI/psychosis and overall level of functioning. Clinician provided active listening and summarizing statements validating client thoughts and feelings. Clinician discussed behavior/attachment patterns learned growing up, and recurring incidents throughout lifetime which could reinforce beliefs. Clinician discussed core values and the role played in discussion making. Clinician encouraged client to journal traits she does not want in a partner and thoughts behind concern about that particular behavior. Clinician and client worked through one example in session relating to independence.   Plan: Return again in 2 weeks.  Diagnosis: Axis I: Generalized Anxiety Disorder and major depressive disorder, mild       Olegario Messier, LCSW 04/24/2019

## 2019-05-08 ENCOUNTER — Other Ambulatory Visit: Payer: Self-pay

## 2019-05-08 ENCOUNTER — Ambulatory Visit (INDEPENDENT_AMBULATORY_CARE_PROVIDER_SITE_OTHER): Payer: Medicaid Other | Admitting: Licensed Clinical Social Worker

## 2019-05-08 DIAGNOSIS — F331 Major depressive disorder, recurrent, moderate: Secondary | ICD-10-CM

## 2019-05-08 DIAGNOSIS — F411 Generalized anxiety disorder: Secondary | ICD-10-CM

## 2019-05-08 NOTE — Progress Notes (Signed)
Virtual Visit via Telephone Note  I connected with Catherine Pope on 05/08/19 at 11:00 AM EST by telephone and verified that I am speaking with the correct person using two identifiers.   I discussed the limitations, risks, security and privacy concerns of performing an evaluation and management service by telephone and the availability of in person appointments. I also discussed with the patient that there may be a patient responsible charge related to this service. The patient expressed understanding and agreed to proceed.   I discussed the assessment and treatment plan with the patient. The patient was provided an opportunity to ask questions and all were answered. The patient agreed with the plan and demonstrated an understanding of the instructions.   The patient was advised to call back or seek an in-person evaluation if the symptoms worsen or if the condition fails to improve as anticipated.  I provided 45 minutes of non-face-to-face time during this encounter.   Olegario Messier, LCSW    THERAPIST PROGRESS NOTE  Session Time: 11am-11:45am  Participation Level: Active  Behavioral Response: NAAlertAnxious, Dysphoric and Irritable  Type of Therapy: Individual Therapy  Treatment Goals addressed: Anxiety, Coping and Diagnosis: Increase use of assertive communication skills to built and maintain boundaries at least 1xdaily at least 5days weekly  Interventions: CBT and Supportive  Summary: Catherine Pope is a 54 y.o. female who presents with symptoms of anxiety and depression. Client reports what is working well is she is trying to be the person I want to be' and what is not working well is family members lying, getting arrested, and client having to take care of other children with no support. Client states she had decreased 'stern-ness' of her boundaries as long as people were honest and trying, however feels she needs to re-impliment and maintain more strict boundaries  again.  Suicidal/Homicidal: Nowithout intent/plan  Therapist Response: Clinician met with client via telehealth. Client was in her home at the time of appointment. Clinician inquired about care of self which developed discrepancies with client on health being a priority vs behaviors not supporting healthy lifestyle. Clinician followed up with client 'deal breakers' for relationships from previous session and processed how these can be applied to non-romantic relationships.  Plan: Return again in 1-2 weeks.  Diagnosis: Axis I: Generalized Anxiety Disorder      Olegario Messier, LCSW 05/08/2019

## 2019-05-09 MED FILL — LISINOPRIL 20 MG TABLET: 20 | 30 days supply | Qty: 30 | Fill #6

## 2019-05-09 MED FILL — FLUTICASONE PROP 50 MCG SPR: 50 | 30 days supply | Qty: 16 | Fill #4

## 2019-05-13 ENCOUNTER — Encounter (HOSPITAL_COMMUNITY): Payer: Self-pay | Admitting: Psychiatry

## 2019-05-13 ENCOUNTER — Ambulatory Visit (INDEPENDENT_AMBULATORY_CARE_PROVIDER_SITE_OTHER): Payer: Medicaid Other | Admitting: Psychiatry

## 2019-05-13 ENCOUNTER — Other Ambulatory Visit: Payer: Self-pay

## 2019-05-13 DIAGNOSIS — F411 Generalized anxiety disorder: Secondary | ICD-10-CM

## 2019-05-13 DIAGNOSIS — F331 Major depressive disorder, recurrent, moderate: Secondary | ICD-10-CM

## 2019-05-13 MED ORDER — DULOXETINE HCL 60 MG PO CPEP: ORAL_CAPSULE | ORAL | 2 refills | Status: DC

## 2019-05-13 MED ORDER — HYDROXYZINE HCL 25 MG PO TABS: 25.0000 mg | ORAL_TABLET | Freq: Three times a day (TID) | ORAL | 2 refills | Status: DC

## 2019-05-13 MED FILL — hydrOXYzine HCL 25 MG TABS: 25 | 30 days supply | Qty: 90 | Fill #0
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
## 2019-05-15 ENCOUNTER — Telehealth (HOSPITAL_COMMUNITY): Payer: Self-pay | Admitting: Licensed Clinical Social Worker

## 2019-05-15 ENCOUNTER — Ambulatory Visit (INDEPENDENT_AMBULATORY_CARE_PROVIDER_SITE_OTHER): Payer: Self-pay | Admitting: Licensed Clinical Social Worker

## 2019-05-15 ENCOUNTER — Other Ambulatory Visit: Payer: Self-pay

## 2019-05-15 DIAGNOSIS — F331 Major depressive disorder, recurrent, moderate: Secondary | ICD-10-CM

## 2019-05-21 ENCOUNTER — Ambulatory Visit: Payer: Medicaid Other | Admitting: Endocrinology

## 2019-05-22 ENCOUNTER — Institutional Professional Consult (permissible substitution): Payer: Medicaid Other | Admitting: Neurology

## 2019-05-25 NOTE — Telephone Encounter (Signed)
Client can re-scheduled.

## 2019-06-02 ENCOUNTER — Other Ambulatory Visit: Payer: Self-pay

## 2019-06-02 ENCOUNTER — Other Ambulatory Visit: Payer: Self-pay | Admitting: Family Medicine

## 2019-06-02 DIAGNOSIS — E785 Hyperlipidemia, unspecified: Secondary | ICD-10-CM

## 2019-06-02 MED FILL — LOVASTATIN 20 MG TABS: 20 | 90 days supply | Qty: 90 | Fill #0

## 2019-06-03 ENCOUNTER — Telehealth: Payer: Self-pay | Admitting: Neurology

## 2019-06-03 ENCOUNTER — Encounter: Payer: Self-pay | Admitting: Neurology

## 2019-06-03 ENCOUNTER — Ambulatory Visit: Payer: Medicaid Other | Admitting: Neurology

## 2019-06-03 VITALS — BP 138/82 | HR 84 | Temp 97.2°F | Ht 66.0 in | Wt 211.5 lb

## 2019-06-03 DIAGNOSIS — M5416 Radiculopathy, lumbar region: Secondary | ICD-10-CM

## 2019-06-03 NOTE — Progress Notes (Signed)
Client no show this appointment.

## 2019-06-03 NOTE — Progress Notes (Signed)
PATIENT: Catherine Pope DOB: 10/29/1965  Chief Complaint  Patient presents with  . Consult for back pain    Reports low back pain radiating down her right leg to her ankle. It worsens with prolonged periods of sitting or standing. She is taking oxycodone, tizanidine, duloxetine and gabapentin for both back and neck pain.  Marland Kitchen PCP    Simona Huh, NP     HISTORICAL  Catherine Pope is a 54 year old female, seen in request by her primary care nurse practitioner Simona Huh for evaluation of low back pain, radiating pain to right lower extremity, initial evaluation was on June 03, 2019.  I have reviewed and summarized the referring note from the referring physician.  She has history of insulin-dependent diabetes for more than 10 years, hyperlipidemia, hypertension,  She presented with chronic low back pain since 2014, gradually getting worse especially since 2019, she reported almost constant right-sided low back pain, radiated to right hip, posterior thigh and, then to the bottom of right foot,  Over the years, she was treated with Cymbalta 60 mg, gabapentin 300 mg 2 tablets 3 times a day without helping her symptoms much, previously she was also treated with meloxicam  She also reported a history of cervical decompression surgery for chronic neck pain, and bilateral shoulder pain in 2019, surgery did help her neck pain, but she still has constant neck pressure, soreness  She has mild unsteady gait, contributed to her low back pain, also has central obesity  I personally reviewed MRI of lumbar spine in March 2019: L4-5, mild broad disc bulging, mild bilateral facet arthropathy, 5x1, broad-based disc bulging, eccentric towards the right, mild right foraminal stenosis MRI of cervical spine September 2019, C6-7 ACDF without residual stenosis  REVIEW OF SYSTEMS: Full 14 system review of systems performed and notable only for as above All other review of systems were negative.   ALLERGIES: No Known Allergies  HOME MEDICATIONS: Current Outpatient Medications  Medication Sig Dispense Refill  . albuterol (PROVENTIL HFA;VENTOLIN HFA) 108 (90 Base) MCG/ACT inhaler Inhale 1-2 puffs into the lungs every 6 (six) hours as needed for wheezing or shortness of breath. 1 Inhaler 0  . allopurinol (ZYLOPRIM) 100 MG tablet Take 100 mg by mouth daily.    . Cholecalciferol (VITAMIN D PO) Take 500 Units by mouth daily.     . Cyanocobalamin (VITAMIN B 12 PO) Take 1,000 mg by mouth daily.    . DULoxetine (CYMBALTA) 60 MG capsule Take one capsule daily 30 capsule 2  . fluticasone (FLONASE) 50 MCG/ACT nasal spray Place 2 sprays into both nostrils as needed. 9.9 g 6  . gabapentin (NEURONTIN) 600 MG tablet Take 600 mg by mouth 3 (three) times daily.    Marland Kitchen glucose blood (TRUE METRIX BLOOD GLUCOSE TEST) test strip Use as instructed 100 each 12  . hydrochlorothiazide (HYDRODIURIL) 25 MG tablet Take 1 tablet (25 mg total) by mouth daily. 90 tablet 6  . hydrOXYzine (ATARAX/VISTARIL) 25 MG tablet Take 1 tablet (25 mg total) by mouth 3 (three) times daily. 90 tablet 2  . Insulin Glargine (LANTUS SOLOSTAR) 100 UNIT/ML Solostar Pen Inject 155 Units into the skin every morning. And pen needles 1/day 20 pen 11  . Lancets (ONETOUCH ULTRASOFT) lancets Use as instructed 100 each 12  . lisinopril (ZESTRIL) 20 MG tablet Take 1 tablet (20 mg total) by mouth daily. 30 tablet 6  . lovastatin (MEVACOR) 20 MG tablet TAKE 1 TABLET (20 MG TOTAL) BY MOUTH AT  BEDTIME. 90 tablet 1  . Oxycodone HCl 10 MG TABS Take 10 mg by mouth in the morning, at noon, and at bedtime.    . Semaglutide, 1 MG/DOSE, (OZEMPIC, 1 MG/DOSE,) 2 MG/1.5ML SOPN Inject 1 mg into the skin once a week. 1 pen 11  . TIZANIDINE HCL PO Take 1 tablet by mouth in the morning, at noon, and at bedtime.    . topiramate (TOPAMAX) 50 MG tablet TAKE 2 TABLETS AT NIGHT AND 1 TABLET 12 HOURS AFTER DAILY. 90 tablet 1   No current facility-administered  medications for this visit.    PAST MEDICAL HISTORY: Past Medical History:  Diagnosis Date  . Anxiety   . Back pain   . Depression   . Diabetes mellitus    dx back in 2010  . History of claustrophobia   . 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 Hypertension   . Migraine   . Neck pain   . Seasonal allergies   . Sinusitis     PAST SURGICAL HISTORY: Past Surgical History:  Procedure Laterality Date  . ABDOMINAL HYSTERECTOMY     per pt , in her 20's  . ANTERIOR CERVICAL DECOMP/DISCECTOMY FUSION N/A 12/27/2016   Procedure: CERVICAL SIX-SEVEN ANTERIOR CERVICAL DECOMPRESSION/DISCECTOMY FUSION;  Surgeon: Eustace Moore, MD;  Location: [facility];  Service: Neurosurgery;  Laterality: N/A;  . ANTERIOR CRUCIATE LIGAMENT REPAIR     right knee  . BREAST EXCISIONAL BIOPSY Left over 20 years ago   benign  . BREAST SURGERY     2 lumps removed at age 61, left breast/ benign  . RADIOLOGY WITH ANESTHESIA Left 12/11/2017   Procedure: MRI cervical spine and left shoulder WITH ANESTHESIA;  Surgeon: Radiologist, Medication, MD;  Location: [facility];  Service: Radiology;  Laterality: Left;  . SHOULDER ARTHROSCOPY     right shoulder    FAMILY HISTORY: Family History  Problem Relation Age of Onset  . Hypertension Mother   . Heart Problems Father   . Diabetes Father   . Hypertension Other   . Heart disease Other   . Stroke Sister   . Kidney disease Brother     SOCIAL HISTORY: Social History   Socioeconomic History  . Marital status: Single    Spouse name: Not on file  . Number of children: 5  . Years of education: 43 th  . Highest education level: Not on file  Occupational History  . Occupation: unemployed  Tobacco Use  . Smoking status: Current Every Day Smoker    Packs/day: 0.50    Years: 15.00    Pack years: 7.50    Types: Cigarettes  . Smokeless tobacco: Never Used  . Tobacco comment: less than half pack a day.  Substance  and Sexual Activity  . Alcohol use: No  . Drug use: No  . Sexual activity: Not Currently  Other Topics Concern  . Not on file  Social History Narrative   Lives at home with children (2 biological, 3 adopted).   Right handed.    Caffeine: three cups daily.   Social Determinants of Health   Financial Resource Strain:   . Difficulty of Paying Living Expenses: Not on file  Food Insecurity:   . Worried About Charity fundraiser in the Last Year: Not on file  . Ran Out of Food in the Last Year: Not on file  Transportation Needs:   . Lack of Transportation (  Medical): Not on file  . Lack of Transportation (Non-Medical): Not on file  Physical Activity:   . Days of Exercise per Week: Not on file  . Minutes of Exercise per Session: Not on file  Stress:   . Feeling of Stress : Not on file  Social Connections:   . Frequency of Communication with Friends and Family: Not on file  . Frequency of Social Gatherings with Friends and Family: Not on file  . Attends Religious Services: Not on file  . Active Member of Clubs or Organizations: Not on file  . Attends Archivist Meetings: Not on file  . Marital Status: Not on file  Intimate Partner Violence:   . Fear of Current or Ex-Partner: Not on file  . Emotionally Abused: Not on file  . Physically Abused: Not on file  . Sexually Abused: Not on file     PHYSICAL EXAM   Vitals:   06/03/19 0836  BP: 138/82  Pulse: 84  Temp: (!) 97.2 F (36.2 C)  Weight: 211 lb 8 oz (95.9 kg)  Height: 5\' 6"  (1.676 m)    Not recorded      Body mass index is 34.14 kg/m.  PHYSICAL EXAMNIATION:  Gen: NAD, conversant, well nourised, well groomed                     Cardiovascular: Regular rate rhythm, no peripheral edema, warm, nontender. Eyes: Conjunctivae clear without exudates or hemorrhage Neck: Supple, no carotid bruits. Pulmonary: Clear to auscultation bilaterally   NEUROLOGICAL EXAM:  MENTAL STATUS: Speech:    Speech is  normal; fluent and spontaneous with normal comprehension.  Cognition:     Orientation to time, place and person     Normal recent and remote memory     Normal Attention span and concentration     Normal Language, naming, repeating,spontaneous speech     Fund of knowledge   CRANIAL NERVES: CN II: Visual fields are full to confrontation. Pupils are round equal and briskly reactive to light. CN III, IV, VI: extraocular movement are normal. No ptosis. CN V: Facial sensation is intact to light touch CN VII: Face is symmetric with normal eye closure  CN VIII: Hearing is normal to causal conversation. CN IX, X: Phonation is normal. CN XI: Head turning and shoulder shrug are intact  MOTOR: There is no pronator drift of out-stretched arms. Muscle bulk and tone are normal. Muscle strength is normal.  REFLEXES: Reflexes are 2+ and symmetric at the biceps, triceps, knees, and absent at ankles. Plantar responses are flexor.  SENSORY: Length dependent decreased to light touch, pinprick to ankle level, and preserved vibratory sensation are intact in fingers and toes.  COORDINATION: There is no trunk or limb dysmetria noted.  GAIT/STANCE: She can get up from seated position arms crossed, has central obesity, steady gait, mild difficulty perform tiptoe, heel and tandem walking, Romberg is absent.   DIAGNOSTIC DATA (LABS, IMAGING, TESTING) - I reviewed patient records, labs, notes, testing and imaging myself where available.   ASSESSMENT AND PLAN  Catherine Pope is a 54 y.o. female   Chronic right low back pain, radiating pain to right lower extremity, Evidence of peripheral neuropathy  Most consistent with right lumbar radiculopathy, with superimposed diabetic peripheral neuropathy  MRI of lumbar spine  EMG nerve conduction study  Refer her to physical therapy  Continue Cymbalta 60 mg daily, gabapentin 600 mg 3 times a day   Marcial Pacas, M.D. Ph.D.  [facility]  [address] [place], [place] [address] Ph: [phone] Fax: [phone]  CC: Simona Huh, NP

## 2019-06-03 NOTE — Telephone Encounter (Signed)
Medicaid order sent to [facility]. They will obtain the auth and reach out to the patient to schedule.  

## 2019-06-04 ENCOUNTER — Ambulatory Visit: Payer: Medicaid Other | Admitting: Endocrinology

## 2019-06-06 ENCOUNTER — Other Ambulatory Visit: Payer: Self-pay

## 2019-06-06 ENCOUNTER — Ambulatory Visit: Payer: Medicaid Other | Admitting: Endocrinology

## 2019-06-06 ENCOUNTER — Encounter: Payer: Self-pay | Admitting: Endocrinology

## 2019-06-06 VITALS — BP 104/62 | HR 92 | Ht 66.0 in | Wt 214.0 lb

## 2019-06-06 DIAGNOSIS — N184 Chronic kidney disease, stage 4 (severe): Secondary | ICD-10-CM | POA: Diagnosis not present

## 2019-06-06 DIAGNOSIS — E1122 Type 2 diabetes mellitus with diabetic chronic kidney disease: Secondary | ICD-10-CM

## 2019-06-06 DIAGNOSIS — Z794 Long term (current) use of insulin: Secondary | ICD-10-CM | POA: Diagnosis not present

## 2019-06-06 LAB — POCT GLYCOSYLATED HEMOGLOBIN (HGB A1C): Hemoglobin A1C: 7.2 % — AB (ref 4.0–5.6)

## 2019-06-06 NOTE — Progress Notes (Signed)
Subjective:    Patient ID: Catherine Pope, female    DOB: 15-Jan-1966, 54 y.o.   MRN: [id number]  HPI Pt returns for f/u of DM DM type: Insulin-requiring type 2 Dx'ed: 2595 Complications: polyneuropathy Therapy: insulin since 2018, and Ozempic GDM: never DKA: never Severe hypoglycemia: never.   Pancreatitis: never Pancreatic imaging: normal on 2005 CT.   Other: she takes QD insulin, after poor results with multiple daily injections.   Interval history: no cbg record, but states cbg's vary from 68-179. Pt says she never misses the insulin.  pt states she feels well in general.  She recently had coronavirus, but she did not receive steroids.   Past Medical History:  Diagnosis Date  . Anxiety   . Back pain   . Depression   . Diabetes mellitus    dx back in 2010  . History of claustrophobia   . 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 Hypertension   . Migraine   . Neck pain   . Seasonal allergies   . Sinusitis     Past Surgical History:  Procedure Laterality Date  . ABDOMINAL HYSTERECTOMY     per pt , in her 20's  . ANTERIOR CERVICAL DECOMP/DISCECTOMY FUSION N/A 12/27/2016   Procedure: CERVICAL SIX-SEVEN ANTERIOR CERVICAL DECOMPRESSION/DISCECTOMY FUSION;  Surgeon: Eustace Moore, MD;  Location: [facility];  Service: Neurosurgery;  Laterality: N/A;  . ANTERIOR CRUCIATE LIGAMENT REPAIR     right knee  . BREAST EXCISIONAL BIOPSY Left over 20 years ago   benign  . BREAST SURGERY     2 lumps removed at age 77, left breast/ benign  . RADIOLOGY WITH ANESTHESIA Left 12/11/2017   Procedure: MRI cervical spine and left shoulder WITH ANESTHESIA;  Surgeon: Radiologist, Medication, MD;  Location: [facility];  Service: Radiology;  Laterality: Left;  . SHOULDER ARTHROSCOPY     right shoulder    Social History   Socioeconomic History  . Marital status: Single    Spouse name: Not on file  . Number of children: 5  . Years of education:  35 th  . Highest education level: Not on file  Occupational History  . Occupation: unemployed  Tobacco Use  . Smoking status: Current Every Day Smoker    Packs/day: 0.50    Years: 15.00    Pack years: 7.50    Types: Cigarettes  . Smokeless tobacco: Never Used  . Tobacco comment: less than half pack a day.  Substance and Sexual Activity  . Alcohol use: No  . Drug use: No  . Sexual activity: Not Currently  Other Topics Concern  . Not on file  Social History Narrative   Lives at home with children (2 biological, 3 adopted).   Right handed.    Caffeine: three cups daily.   Social Determinants of Health   Financial Resource Strain:   . Difficulty of Paying Living Expenses: Not on file  Food Insecurity:   . Worried About Charity fundraiser in the Last Year: Not on file  . Ran Out of Food in the Last Year: Not on file  Transportation Needs:   . Lack of Transportation (Medical): Not on file  . Lack of Transportation (Non-Medical): Not on file  Physical Activity:   . Days of Exercise per Week: Not on file  . Minutes of Exercise per Session: Not on file  Stress:   . Feeling of Stress :  Not on file  Social Connections:   . Frequency of Communication with Friends and Family: Not on file  . Frequency of Social Gatherings with Friends and Family: Not on file  . Attends Religious Services: Not on file  . Active Member of Clubs or Organizations: Not on file  . Attends Archivist Meetings: Not on file  . Marital Status: Not on file  Intimate Partner Violence:   . Fear of Current or Ex-Partner: Not on file  . Emotionally Abused: Not on file  . Physically Abused: Not on file  . Sexually Abused: Not on file    Current Outpatient Medications on File Prior to Visit  Medication Sig Dispense Refill  . albuterol (PROVENTIL HFA;VENTOLIN HFA) 108 (90 Base) MCG/ACT inhaler Inhale 1-2 puffs into the lungs every 6 (six) hours as needed for wheezing or shortness of breath. 1 Inhaler  0  . allopurinol (ZYLOPRIM) 100 MG tablet Take 100 mg by mouth daily.    . Cholecalciferol (VITAMIN D PO) Take 500 Units by mouth daily.     . Cyanocobalamin (VITAMIN B 12 PO) Take 1,000 mg by mouth daily.    . DULoxetine (CYMBALTA) 60 MG capsule Take one capsule daily 30 capsule 2  . fluticasone (FLONASE) 50 MCG/ACT nasal spray Place 2 sprays into both nostrils as needed. 9.9 g 6  . gabapentin (NEURONTIN) 600 MG tablet Take 600 mg by mouth 3 (three) times daily.    Marland Kitchen glucose blood (TRUE METRIX BLOOD GLUCOSE TEST) test strip Use as instructed 100 each 12  . hydrochlorothiazide (HYDRODIURIL) 25 MG tablet Take 1 tablet (25 mg total) by mouth daily. 90 tablet 6  . hydrOXYzine (ATARAX/VISTARIL) 25 MG tablet Take 1 tablet (25 mg total) by mouth 3 (three) times daily. 90 tablet 2  . Insulin Glargine (LANTUS SOLOSTAR) 100 UNIT/ML Solostar Pen Inject 155 Units into the skin every morning. And pen needles 1/day 20 pen 11  . Lancets (ONETOUCH ULTRASOFT) lancets Use as instructed 100 each 12  . lisinopril (ZESTRIL) 20 MG tablet Take 1 tablet (20 mg total) by mouth daily. 30 tablet 6  . lovastatin (MEVACOR) 20 MG tablet TAKE 1 TABLET (20 MG TOTAL) BY MOUTH AT BEDTIME. 90 tablet 1  . Oxycodone HCl 10 MG TABS Take 10 mg by mouth in the morning, at noon, and at bedtime.    . Semaglutide, 1 MG/DOSE, (OZEMPIC, 1 MG/DOSE,) 2 MG/1.5ML SOPN Inject 1 mg into the skin once a week. 1 pen 11  . TIZANIDINE HCL PO Take 1 tablet by mouth in the morning, at noon, and at bedtime.    . topiramate (TOPAMAX) 50 MG tablet TAKE 2 TABLETS AT NIGHT AND 1 TABLET 12 HOURS AFTER DAILY. 90 tablet 1   No current facility-administered medications on file prior to visit.    No Known Allergies  Family History  Problem Relation Age of Onset  . Hypertension Mother   . Heart Problems Father   . Diabetes Father   . Hypertension Other   . Heart disease Other   . Stroke Sister   . Kidney disease Brother     BP 104/62 (BP Location:  Left Arm, Patient Position: Sitting, Cuff Size: Large)   Pulse 92   Ht 5\' 6"  (1.676 m)   Wt 214 lb (97.1 kg)   SpO2 96%   BMI 34.54 kg/m    Review of Systems She denies LOC    Objective:   Physical Exam VITAL SIGNS:  See vs page  GENERAL: no distress Pulses: dorsalis pedis intact bilat.   MSK: no deformity of the feet CV: no leg edema Skin:  no ulcer on the feet.  normal color and temp on the feet. Neuro: sensation is intact to touch on the feet  Lab Results  Component Value Date   HGBA1C 7.2 (A) 06/06/2019   Lab Results  Component Value Date   CREATININE 0.78 01/28/2018   BUN 5 (L) 01/28/2018   NA 142 01/28/2018   K 3.1 (L) 01/28/2018   CL 103 01/28/2018   CO2 22 01/28/2018       Assessment & Plan:  Insulin-requiring type 2 DM, with PN: this is the best control this pt should aim for, given this regimen, which does match insulin to her changing needs throughout the day Hypoglycemia: this also limits aggressiveness of glycemic control.   Patient Instructions  check your blood sugar twice a day.  vary the time of day when you check, between before the 3 meals, and at bedtime.  also check if you have symptoms of your blood sugar being too high or too low.  please keep a record of the readings and bring it to your next appointment here (or you can bring the meter itself).  You can write it on any piece of paper.  please call us sooner if your blood sugar goes below 70, or if you have a lot of readings over 200.    Please continue the same medications.   You should consider adding a pill such as "[place]." please let us know.  If we added this, we would need to reduce the insulin, and stop the HCTZ. Please come back for a follow-up appointment in 3 months.

## 2019-06-06 NOTE — Patient Instructions (Addendum)
check your blood sugar twice a day.  vary the time of day when you check, between before the 3 meals, and at bedtime.  also check if you have symptoms of your blood sugar being too high or too low.  please keep a record of the readings and bring it to your next appointment here (or you can bring the meter itself).  You can write it on any piece of paper.  please call us sooner if your blood sugar goes below 70, or if you have a lot of readings over 200.    Please continue the same medications.   You should consider adding a pill such as "[place]." please let us know.  If we added this, we would need to reduce the insulin, and stop the HCTZ. Please come back for a follow-up appointment in 3 months.

## 2019-06-09 MED FILL — HYDROCHLOROTHIAZIDE 25 MG T: 25 | 90 days supply | Qty: 90 | Fill #3

## 2019-06-09 MED FILL — hydrOXYzine HCL 25 MG TABS: 25 | 30 days supply | Qty: 90 | Fill #1

## 2019-06-09 MED FILL — FLUTICASONE PROP 50 MCG SPR: 50 | 30 days supply | Qty: 16 | Fill #5

## 2019-06-13 IMAGING — MR MR LUMBAR SPINE W/O CM
4 of 5 series · 18 of 48 positions shown · non-contrast
Comparison: None.

CLINICAL DATA: Low back pain with sciatica for 2 years.

EXAM:
MRI LUMBAR SPINE WITHOUT CONTRAST
TECHNIQUE: Multiplanar, multisequence MR imaging of the lumbar spine was
performed. No intravenous contrast was administered.

[Series 6: T2 · sagittal · 4.0mm · 0.73mm/px · 6 of 13 slices shown (1 of 2)]
[im 1/13]
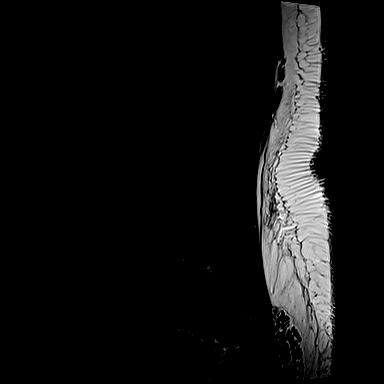
[im 3/13]
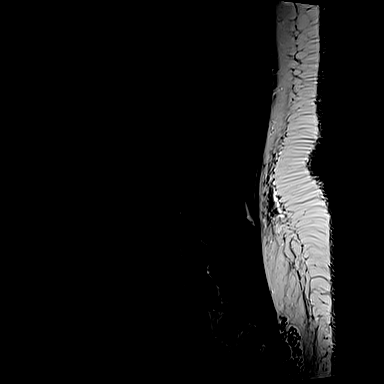
[im 5/13]
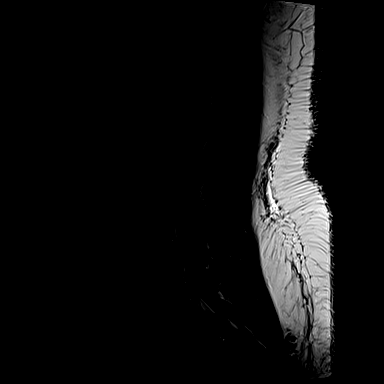
[im 8/13]
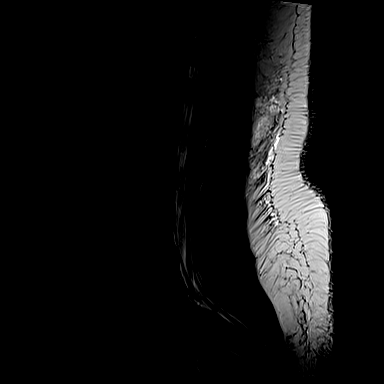
[im 10/13]
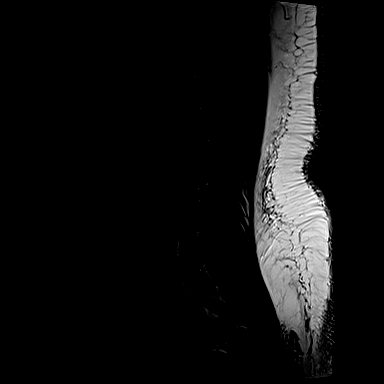
[im 13/13]
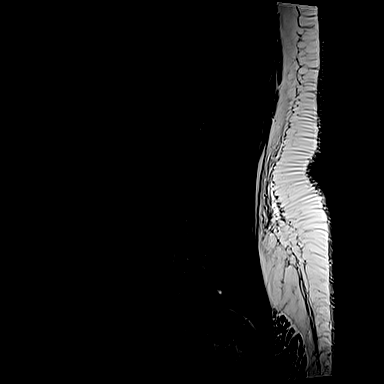

[Series 11: T1 · axial · 4.0mm · 0.28mm/px · z∈[+1,+163]mm · 3 of 41 slices shown (1 of 2)]
[im 6/41]
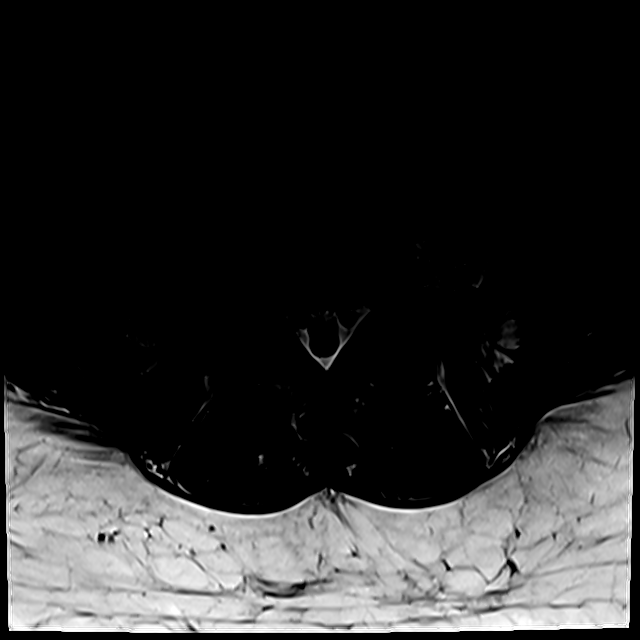
[im 22/41]
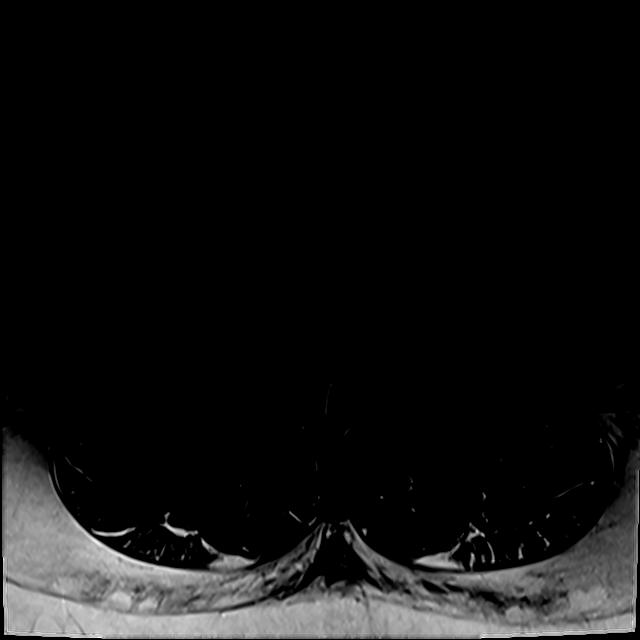
[im 35/41]
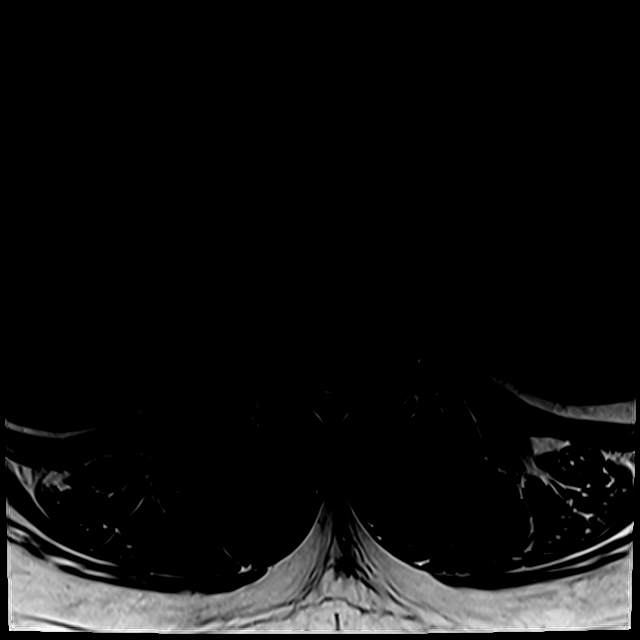

[Series 14: T2 · axial · 4.0mm · 0.28mm/px · z∈[-14,+163]mm · 6 of 41 slices shown (2 of 2)]
[im 3/41]
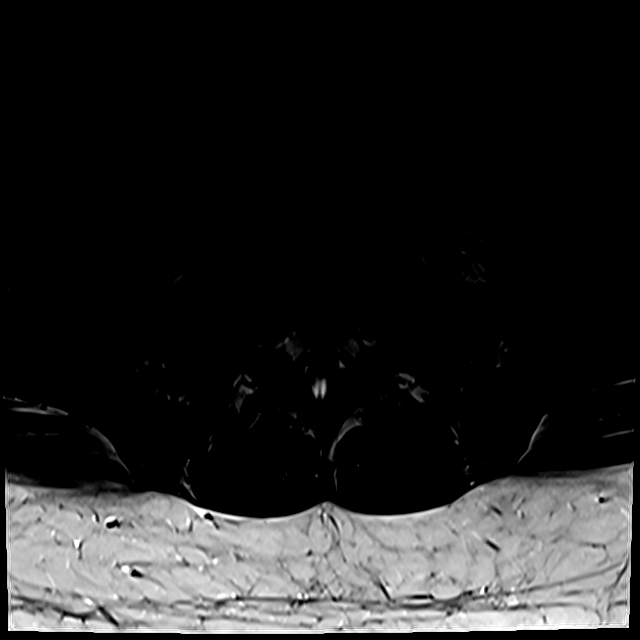
[im 6/41]
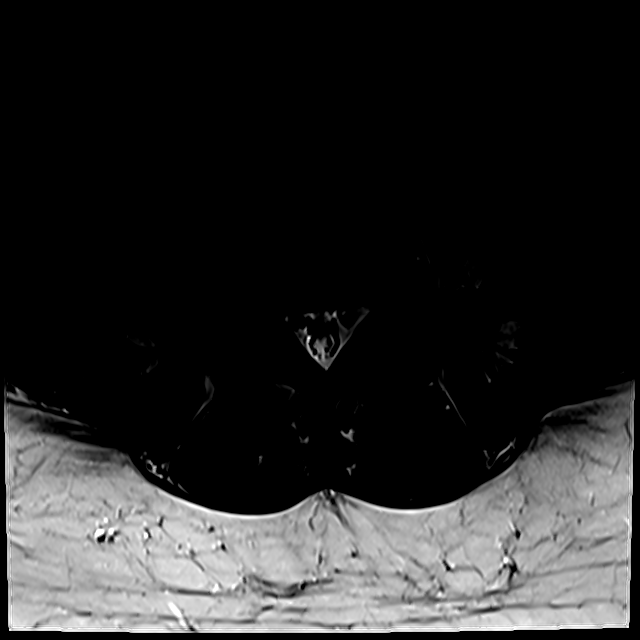
[im 9/41]
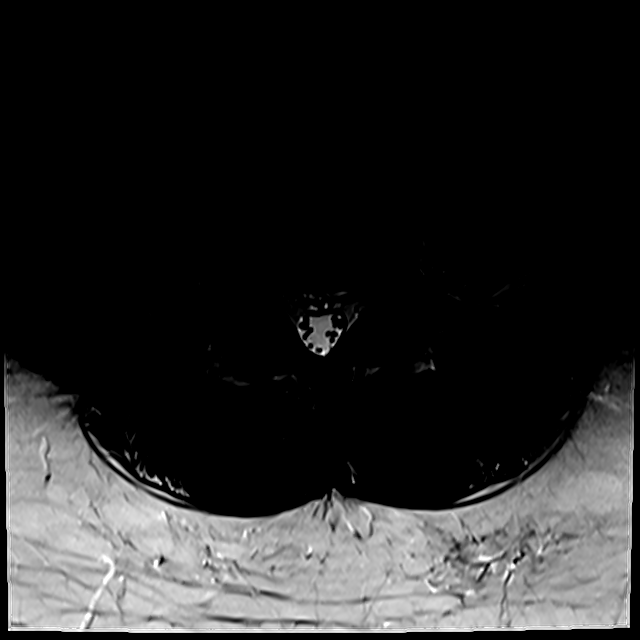
[im 14/41]
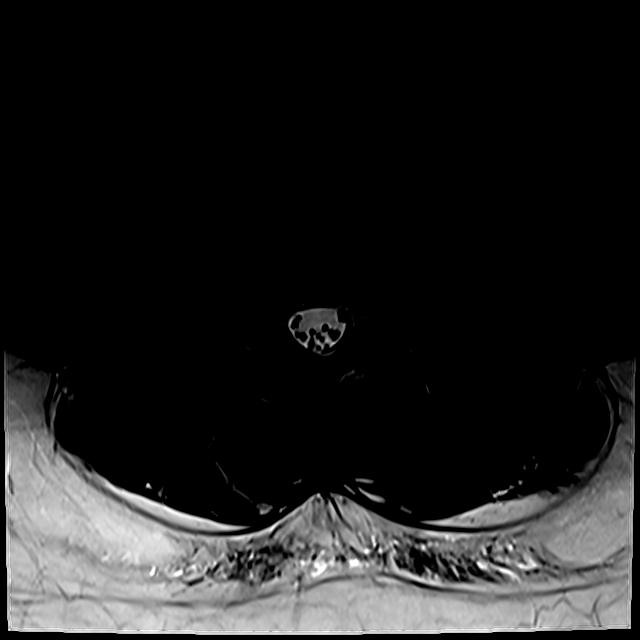
[im 22/41]
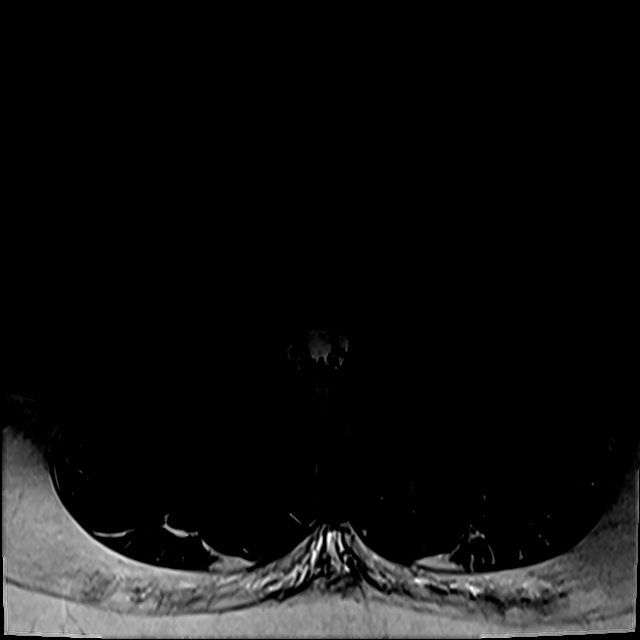
[im 35/41]
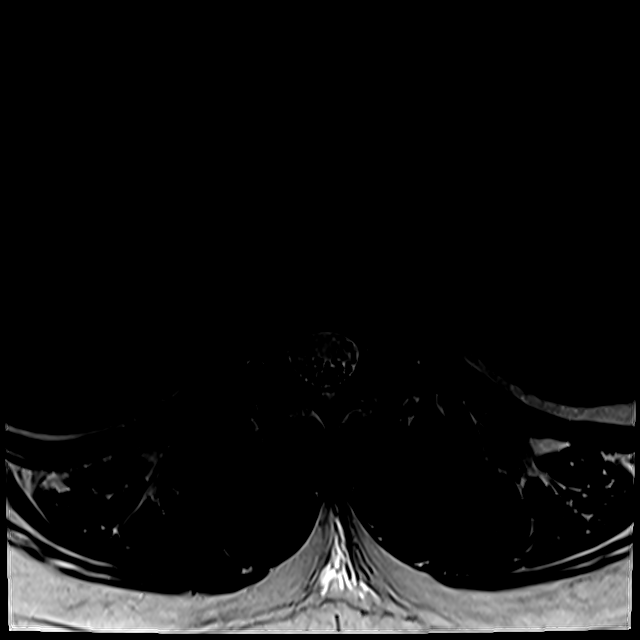

[Series 15: T1 · sagittal · 4.0mm · 0.73mm/px · 3 of 13 slices shown (2 of 2)]
[im 1/13]
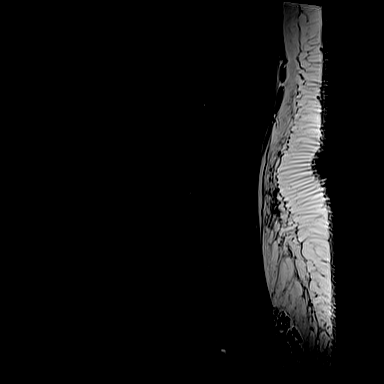
[im 7/13]
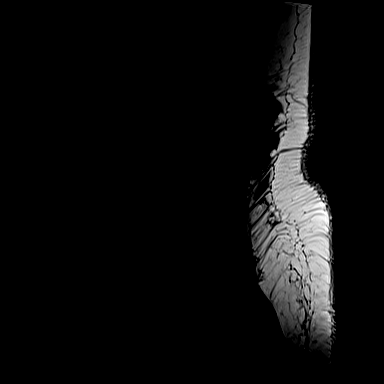
[im 13/13]
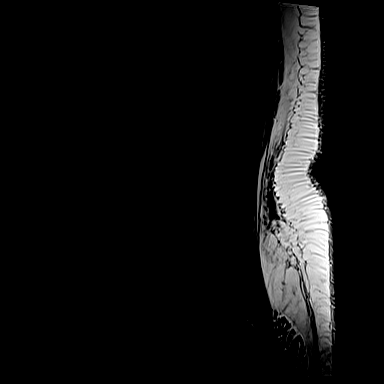

[18 of 48 positions shown; findings below may reference images not displayed]

FINDINGS: Segmentation:  Standard.

Alignment:  Physiologic.

Vertebrae:  No fracture, evidence of discitis, or bone lesion.

Conus medullaris and cauda equina: Conus extends to the T12 level.
Conus and cauda equina appear normal.

Paraspinal and other soft tissues: No acute paraspinal abnormality.

Disc levels:

Disc spaces: Disc desiccation at L3-4, L4-5 and L5-S1. No
significant disc height loss.

T12-L1: No significant disc bulge. No evidence of neural foraminal
stenosis. No central canal stenosis.

L1-L2: No significant disc bulge. No evidence of neural foraminal
stenosis. No central canal stenosis.

L2-L3: No significant disc bulge. No evidence of neural foraminal
stenosis. No central canal stenosis.

L3-L4: Mild broad-based disc bulge. No evidence of neural foraminal
stenosis. No central canal stenosis.

L4-L5: Mild broad-based disc bulge. Mild bilateral facet
arthropathy. No evidence of neural foraminal stenosis. No central
canal stenosis.

L5-S1: Broad-based disc bulge eccentric towards right. Mild right
foraminal stenosis. No left foraminal stenosis. No central canal
stenosis.
IMPRESSION: 1. At L4-5 there is a mild broad-based disc bulge and mild bilateral
facet arthropathy.
2. At L5-S1 there is a broad-based disc bulge eccentric towards the
right. Mild right foraminal stenosis.

## 2019-06-13 MED FILL — DULoxetine HCL 60 MG CPEP: 60 | 30 days supply | Qty: 30 | Fill #0

## 2019-06-19 ENCOUNTER — Institutional Professional Consult (permissible substitution): Payer: Medicaid Other | Admitting: Neurology

## 2019-07-01 ENCOUNTER — Other Ambulatory Visit: Payer: Medicaid Other

## 2019-07-02 ENCOUNTER — Ambulatory Visit (INDEPENDENT_AMBULATORY_CARE_PROVIDER_SITE_OTHER): Payer: Medicaid Other | Admitting: Neurology

## 2019-07-02 ENCOUNTER — Encounter: Payer: Medicaid Other | Admitting: Neurology

## 2019-07-02 ENCOUNTER — Other Ambulatory Visit: Payer: Self-pay

## 2019-07-02 DIAGNOSIS — M5416 Radiculopathy, lumbar region: Secondary | ICD-10-CM | POA: Diagnosis not present

## 2019-07-02 DIAGNOSIS — Z0289 Encounter for other administrative examinations: Secondary | ICD-10-CM

## 2019-07-02 NOTE — Telephone Encounter (Signed)
Dr. Claudette Head will be calling to complete the peer-to-peer review. The telephone call was scheduled on 07/03/19 at 11:30am.

## 2019-07-02 NOTE — Procedures (Signed)
Full Name: Catherine Pope Gender: Female MRN #: [id number] Date of Birth: [birth year]    Visit Date: 07/02/2019 08:57 Age: 54 Years Examining Physician: Marcial Pacas, MD  Referring Physician: Marcial Pacas, MD History: 54 years old female presented with chronic low back pain, radiating pain to right lower extremity  Summary of the tests:  Nerve conduction study:  Right sural, superficial peroneal sensory responses were normal.  Left sural and superficial peroneal sensory responses showed mildly prolonged peak latency with mildly decreased snap amplitude. Bilateral tibial, peroneal to EDB motor responses were normal.  Electromyography: Selected needle examinations of bilateral lower extremity muscles and bilateral lumbosacral paraspinal muscles were normal   Conclusion: This is essentially a normal study.  There is no evidence of right lumbosacral radiculopathy, large fiber peripheral neuropathy.  The mild abnormality at left lower extremity sensory responses could due to cold limb temperature and suboptimal technical difficulties.    ------------------------------- Marcial Pacas, M.D. PhD  [facility] [address], [place], [place] [address] Tel: [phone] Fax: [phone]         [facility]    Nerve / Sites Muscle Latency Ref. Amplitude Ref. Rel Amp Segments Distance Velocity Ref. Area    ms ms mV mV %  cm m/s m/s mVms  R Peroneal - EDB     Ankle EDB 5.3 ?6.5 6.1 ?2.0 100 Ankle - EDB 9   21.8     Fib head EDB 12.8  5.5  90.2 Fib head - Ankle 33 44 ?44 21.1     Pop fossa EDB 15.1  5.3  95.7 Pop fossa - Fib head 10 44 ?44 20.8         Pop fossa - Ankle      L Peroneal - EDB     Ankle EDB 5.9 ?6.5 5.6 ?2.0 100 Ankle - EDB 9   19.9     Fib head EDB 13.4  5.2  93.9 Fib head - Ankle 33 44 ?44 19.0     Pop fossa EDB 15.7  5.0  95.8 Pop fossa - Fib head 10 44 ?44 18.2         Pop fossa - Ankle      R Tibial - AH     Ankle AH 4.6 ?5.8 10.1 ?4.0 100  Ankle - AH 9   22.6     Pop fossa AH 14.8  7.1  70 Pop fossa - Ankle 42 41 ?41 21.5  L Tibial - AH     Ankle AH 4.9 ?5.8 9.5 ?4.0 100 Ankle - AH 9   26.5     Pop fossa AH 15.1  7.2  75.8 Pop fossa - Ankle 42 41 ?41 25.9             SNC    Nerve / Sites Rec. Site Peak Lat Ref.  Amp Ref. Segments Distance    ms ms V V  cm  R Sural - Ankle (Calf)     Calf Ankle 4.1 ?4.4 7 ?6 Calf - Ankle 14  L Sural - Ankle (Calf)     Calf Ankle 4.9 ?4.4 4 ?6 Calf - Ankle 14  R Superficial peroneal - Ankle     Lat leg Ankle 4.4 ?4.4 6 ?6 Lat leg - Ankle 14  L Superficial peroneal - Ankle     Lat leg Ankle 5.1 ?4.4 4 ?6 Lat leg - Ankle 14  F  Wave    Nerve F Lat Ref.   ms ms  R Tibial - AH 55.4 ?56.0  L Tibial - AH 61.7 ?56.0         EMG Summary Table    Spontaneous MUAP Recruitment  Muscle IA Fib PSW Fasc Other Amp Dur. Poly Pattern  R. Tibialis anterior Normal None None None _______ Normal Normal Normal Normal  R. Tibialis posterior Normal None None None _______ Normal Normal Normal Normal  R. Peroneus longus Normal None None None _______ Normal Normal Normal Normal  R. Gastrocnemius (Medial head) Normal None None None _______ Normal Normal Normal Normal  R. Vastus lateralis Normal None None None _______ Normal Normal Normal Normal  L. Tibialis anterior Normal None None None _______ Normal Normal Normal Normal  L. Tibialis posterior Normal None None None _______ Normal Normal Normal Normal  L. Peroneus longus Normal None None None _______ Normal Normal Normal Normal  L. Gastrocnemius (Medial head) Normal None None None _______ Normal Normal Normal Normal  L. Vastus lateralis Normal None None None _______ Normal Normal Normal Normal  R. Lumbar paraspinals (low) Normal None None None _______ Normal Normal Normal Normal  R. Lumbar paraspinals (mid) Normal None None None _______ Normal Normal Normal Normal  L. Lumbar paraspinals (low) Normal None None None _______ Normal Normal Normal  Normal  L. Lumbar paraspinals (mid) Normal None None None _______ Normal Normal Normal Normal

## 2019-07-02 NOTE — Telephone Encounter (Signed)
Please connect me peer to peer

## 2019-07-02 NOTE — Telephone Encounter (Signed)
Medicaid did not approve the MRI  Based on [employer] [facility] Guidelines Section(s): SP 6.1 Lower Extremity Pain with Neurological Features (Radiculopathy, Radiculitis, or Plexopathy and Neuropathy) with or without Low Back (Lumbar Spine) Pain and 1.0 General Guidelines, we cannot approve this request. Your records show that you are having lower back pain that travels to your hip and/or leg. The reason this request cannot be approved is because: 1. One of the following must be met. -Follow up contact (in person, by phone, by mail, or by messaging) with your doctor confirmed a failed recent (within three months) six week trial of doctor prescribed treatment. Supported treatments include (but are not limited to) medications for swelling or pain, physical therapy, and/or oral or injected steroids. -You have a sign or symptom that suggests a serious underlying condition for which a trial of treatment is not needed.  There is an option to do a peer to peer the phone number is [phone] option 4 and the case number is [id number]. It does have to be scheduled by 07/08/19.

## 2019-07-03 NOTE — Telephone Encounter (Signed)
MRI lumbar   [id number]

## 2019-07-03 NOTE — Telephone Encounter (Signed)
Noted, thank you

## 2019-07-05 ENCOUNTER — Other Ambulatory Visit: Payer: Medicaid Other

## 2019-07-07 ENCOUNTER — Other Ambulatory Visit: Payer: Self-pay | Admitting: Neurology

## 2019-07-07 DIAGNOSIS — M5416 Radiculopathy, lumbar region: Secondary | ICD-10-CM

## 2019-07-07 MED FILL — FLUTICASONE PROP 50 MCG SPR: 50 | 30 days supply | Qty: 16 | Fill #6

## 2019-07-07 MED FILL — hydrOXYzine HCL 25 MG TABS: 25 | 30 days supply | Qty: 90 | Fill #2

## 2019-07-17 MED FILL — DULoxetine HCL 60 MG CPEP: 60 | 30 days supply | Qty: 30 | Fill #1

## 2019-07-31 ENCOUNTER — Other Ambulatory Visit: Payer: Self-pay | Admitting: Family Medicine

## 2019-07-31 DIAGNOSIS — I10 Essential (primary) hypertension: Secondary | ICD-10-CM

## 2019-08-02 ENCOUNTER — Inpatient Hospital Stay: Admission: RE | Admit: 2019-08-02 | Payer: Self-pay | Source: Ambulatory Visit

## 2019-08-06 ENCOUNTER — Telehealth (INDEPENDENT_AMBULATORY_CARE_PROVIDER_SITE_OTHER): Payer: Medicaid Other | Admitting: Psychiatry

## 2019-08-06 ENCOUNTER — Encounter (HOSPITAL_COMMUNITY): Payer: Self-pay | Admitting: Psychiatry

## 2019-08-06 ENCOUNTER — Other Ambulatory Visit: Payer: Self-pay

## 2019-08-06 DIAGNOSIS — F411 Generalized anxiety disorder: Secondary | ICD-10-CM | POA: Diagnosis not present

## 2019-08-06 DIAGNOSIS — F331 Major depressive disorder, recurrent, moderate: Secondary | ICD-10-CM

## 2019-08-06 MED ORDER — HYDROXYZINE HCL 25 MG PO TABS: 25.0000 mg | ORAL_TABLET | Freq: Three times a day (TID) | ORAL | 2 refills | Status: DC

## 2019-08-06 MED ORDER — DULOXETINE HCL 60 MG PO CPEP: ORAL_CAPSULE | ORAL | 2 refills | Status: DC

## 2019-08-06 MED FILL — hydrOXYzine HCL 25 MG TABS: 25 | 30 days supply | Qty: 90 | Fill #0

## 2019-08-06 NOTE — Progress Notes (Signed)
Virtual Visit via Telephone Note  I connected with Catherine Pope on 08/06/19 at  9:40 AM EDT by telephone and verified that I am speaking with the correct person using two identifiers.   I discussed the limitations, risks, security and privacy concerns of performing an evaluation and management service by telephone and the availability of in person appointments. I also discussed with the patient that there may be a patient responsible charge related to this service. The patient expressed understanding and agreed to proceed.   History of Present Illness: Patient is evaluated by phone session.  She admitted having panic attacks few weeks ago when she had her taco weaknesses are increasing.  She also felt she may have symptoms and she called the ambulance but she was given reassurance and asked to take her anxiety medicine and she calmed down.  She is having back pain but is recently flared up and now she is seeing neurology and hoping this thing resolved.  Patient reported even though social services case is closed but we are still investigating Tylenol or station with her adopted children.  She also mentioned may be having some court dates in the future and that also needed for anxiety.  She lives with her biological daughter and 3 adopted children.  Recently she had a blood work and her hemoglobin A1c is slightly better.  She has no tremors or shakes or any EPS.  She stopped therapy with Ramond Craver because she felt she broke the trust when she provide information to the social services without her consent.  She like to see a different therapist.  She does not want her information to be disclosed unless given the consent.   Past Psychiatric History:Reviewed. No H/Oinpatient or any suicidal attempt. Seen at [facility] and given Seroquel, Effexor, BuSpar and Wellbutrin. Effexor cause grogginess, Seroquel cause weight gain increased blood sugar. Did not took Wellbutrin consistently. No H/Omania or  psychosis.  Recent Results (from the past 2160 hour(s))  POCT HgB A1C     Status: Abnormal   Collection Time: 06/06/19 11:02 AM  Result Value Ref Range   Hemoglobin A1C 7.2 (A) 4.0 - 5.6 %   HbA1c POC (<> result, manual entry)     HbA1c, POC (prediabetic range)     HbA1c, POC (controlled diabetic range)        Psychiatric Specialty Exam: Physical Exam  Review of Systems  Musculoskeletal: Positive for back pain.    There were no vitals taken for this visit.There is no height or weight on file to calculate BMI.  General Appearance: NA  Eye Contact:  NA  Speech:  Slow  Volume:  Decreased  Mood:  Anxious  Affect:  NA  Thought Process:  Descriptions of Associations: Intact  Orientation:  Full (Time, Place, and Person)  Thought Content:  Rumination  Suicidal Thoughts:  No  Homicidal Thoughts:  No  Memory:  Immediate;   Good Recent;   Good Remote;   Good  Judgement:  Fair  Insight:  Fair  Psychomotor Activity:  NA  Concentration:  Concentration: Fair and Attention Span: Fair  Recall:  Good  Fund of Knowledge:  Good  Language:  Good  Akathisia:  No  Handed:  Right  AIMS (if indicated):     Assets:  Communication Skills Desire for Improvement Housing Resilience  ADL's:  Intact  Cognition:  WNL  Sleep:   fair      Assessment and Plan: Major depressive disorder, recurrent.  Generalized anxiety disorder.  Patient does not feel comfortable with [facility] after she release and permission to social services without her consent.  She like to try a different therapist.  Reassurance given.  Review of her blood work her hemoglobin A1c is better.  Encouraged to take the medicine as prescribed.  She feels better when she takes the medicine.  I will continue 25 mg hydroxyzine in the morning and 50 at bedtime and Cymbalta 60 mg daily.  I also encouraged to keep appointment with neurology for chronic back pain.  She is hoping to have testing to resolve this issue.  I recommend to call  us back if she is any question or any concern.  We will provide therapist name and encouraged to call them to schedule appointment.  Follow-up in 3 months.  Follow Up Instructions:    I discussed the assessment and treatment plan with the patient. The patient was provided an opportunity to ask questions and all were answered. The patient agreed with the plan and demonstrated an understanding of the instructions.   The patient was advised to call back or seek an in-person evaluation if the symptoms worsen or if the condition fails to improve as anticipated.  I provided 20 minutes of non-face-to-face time during this encounter.   Catherine Nations, MD

## 2019-08-27 MED FILL — LOVASTATIN 20 MG TABS: 20 | 90 days supply | Qty: 90 | Fill #1

## 2019-09-02 MED FILL — hydrOXYzine HCL 25 MG TABS: 25 | 30 days supply | Qty: 90 | Fill #1

## 2019-09-03 MED FILL — DULoxetine HCL 60 MG CPEP: 60 | 30 days supply | Qty: 30 | Fill #0

## 2019-09-05 ENCOUNTER — Ambulatory Visit: Payer: Medicaid Other | Admitting: Endocrinology

## 2019-10-02 MED FILL — DULoxetine HCL 60 MG CPEP: 60 | 30 days supply | Qty: 30 | Fill #1

## 2019-10-02 MED FILL — hydrOXYzine HCL 25 MG TABS: 25 | 30 days supply | Qty: 90 | Fill #2

## 2019-11-03 MED FILL — DULoxetine HCL 60 MG CPEP: 60 | 30 days supply | Qty: 30 | Fill #2

## 2019-11-06 ENCOUNTER — Telehealth (INDEPENDENT_AMBULATORY_CARE_PROVIDER_SITE_OTHER): Payer: Medicaid Other | Admitting: Psychiatry

## 2019-11-06 ENCOUNTER — Encounter (HOSPITAL_COMMUNITY): Payer: Self-pay | Admitting: Psychiatry

## 2019-11-06 ENCOUNTER — Other Ambulatory Visit: Payer: Self-pay

## 2019-11-06 ENCOUNTER — Other Ambulatory Visit (HOSPITAL_COMMUNITY): Payer: Self-pay | Admitting: Psychiatry

## 2019-11-06 DIAGNOSIS — F411 Generalized anxiety disorder: Secondary | ICD-10-CM | POA: Diagnosis not present

## 2019-11-06 DIAGNOSIS — F331 Major depressive disorder, recurrent, moderate: Secondary | ICD-10-CM

## 2019-11-06 MED ORDER — DULOXETINE HCL 60 MG PO CPEP: ORAL_CAPSULE | ORAL | 2 refills | Status: DC

## 2019-11-06 MED ORDER — HYDROXYZINE HCL 25 MG PO TABS: 25.0000 mg | ORAL_TABLET | Freq: Three times a day (TID) | ORAL | 2 refills | Status: DC

## 2019-11-06 MED FILL — hydrOXYzine HCL 25 MG TABS: 25 | 30 days supply | Qty: 90 | Fill #0

## 2019-11-06 NOTE — Progress Notes (Signed)
Virtual Visit via Telephone Note  I connected with Catherine Pope on 11/06/19 at  9:00 AM EDT by telephone and verified that I am speaking with the correct person using two identifiers.  Location: Patient: home Provider: home office   I discussed the limitations, risks, security and privacy concerns of performing an evaluation and management service by telephone and the availability of in person appointments. I also discussed with the patient that there may be a patient responsible charge related to this service. The patient expressed understanding and agreed to proceed.   History of Present Illness: Patient is evaluated by phone session.  She is taking her medication and denies any side effects.  She is relieved that investigation about her adopted children is over now.  Social services has closed the case.  She has no more court dates.  She still struggles sometimes with insomnia but otherwise she feels the medicine helping.  She has a rather anxiety but denies any crying spells or any feeling of hopelessness or worthlessness.  She has back pain.  She lives with her biological daughter and 3 adopted children.  She is trying to lose weight and since the last visit she has lost 10 pounds.  She is hoping next hemoglobin A1c will be much better.  She has no tremors, shakes or any EPS.  She is not able to get appointment with therapy as she is still awaiting as therapist booked.  She is taking pain medicine and gabapentin from other provider.  She is taking hydroxyzine 25 mg in the morning which helps anxiety and 50 mg at bedtime.   Past Psychiatric History:Reviewed. No H/Oinpatient or any suicidal attempt. Seen at [facility] and given Seroquel, Effexor, BuSpar and Wellbutrin. Effexor cause grogginess, Seroquel cause weight gain increased blood sugar. Did not took Wellbutrin consistently. No H/Omania or psychosis.  Psychiatric Specialty Exam: Physical Exam  Review of Systems  Musculoskeletal:  Positive for back pain.    Weight 198 lb (89.8 kg).There is no height or weight on file to calculate BMI.  General Appearance: NA  Eye Contact:  NA  Speech:  Clear and Coherent and Slow  Volume:  Normal  Mood:  Euthymic  Affect:  NA  Thought Process:  Goal Directed  Orientation:  Full (Time, Place, and Person)  Thought Content:  Logical  Suicidal Thoughts:  No  Homicidal Thoughts:  No  Memory:  Immediate;   Good Recent;   Fair Remote;   Good  Judgement:  Intact  Insight:  Present  Psychomotor Activity:  NA  Concentration:  Concentration: Fair and Attention Span: Fair  Recall:  Good  Fund of Knowledge:  Good  Language:  Good  Akathisia:  No  Handed:  Right  AIMS (if indicated):     Assets:  Communication Skills Desire for Improvement Housing Resilience Social Support  ADL's:  Intact  Cognition:  WNL  Sleep:   fair      Assessment and Plan: Major depressive disorder, recurrent.  Generalized anxiety disorder.  Patient is doing okay other than occasional insomnia.  Sometimes she feels the hydroxyzine 50 mg at bedtime does not help sleep but does help anxiety.  I review her medication.  She is taking pain medication, gabapentin.  I recommend she should try over-the-counter melatonin to help insomnia rather than prescription medicine as she is taking multiple meds.  She agreed with the plan.  I do believe she need to see a therapist and we will provide some names for outpatient  therapist.  She agreed with the plan.  Encouraged to continue appointment with physician and continue healthy lifestyle and watching her caloric intake.  She had lost weight since the last visit.  Continue Cymbalta 60 mg daily and hydroxyzine 25 mg in the morning and 50 at bedtime.  Recommended to call us back if she is any question or any concern.  Follow-up in 3 months.  Follow Up Instructions:    I discussed the assessment and treatment plan with the patient. The patient was provided an opportunity  to ask questions and all were answered. The patient agreed with the plan and demonstrated an understanding of the instructions.   The patient was advised to call back or seek an in-person evaluation if the symptoms worsen or if the condition fails to improve as anticipated.  I provided 15 minutes of non-face-to-face time during this encounter.   Kathlee Nations, MD

## 2019-11-19 ENCOUNTER — Other Ambulatory Visit: Payer: Self-pay | Admitting: Nurse Practitioner

## 2019-11-19 DIAGNOSIS — Z1231 Encounter for screening mammogram for malignant neoplasm of breast: Secondary | ICD-10-CM

## 2019-11-24 ENCOUNTER — Other Ambulatory Visit: Payer: Self-pay

## 2019-11-24 ENCOUNTER — Ambulatory Visit
Admission: RE | Admit: 2019-11-24 | Discharge: 2019-11-24 | Disposition: A | Discharge status: Home / Self Care | Payer: Medicaid Other | Source: Ambulatory Visit | Attending: Nurse Practitioner | Admitting: Nurse Practitioner

## 2019-11-24 DIAGNOSIS — Z1231 Encounter for screening mammogram for malignant neoplasm of breast: Secondary | ICD-10-CM

## 2019-12-03 MED FILL — hydrOXYzine HCL 25 MG TABS: 25 | 30 days supply | Qty: 90 | Fill #1

## 2019-12-03 MED FILL — DULoxetine HCL 60 MG CPEP: 60 | 30 days supply | Qty: 30 | Fill #0

## 2019-12-19 ENCOUNTER — Ambulatory Visit: Payer: Medicaid Other | Admitting: Endocrinology

## 2019-12-30 MED FILL — DULoxetine HCL 60 MG CPEP: 60 | 30 days supply | Qty: 30 | Fill #1

## 2019-12-30 MED FILL — hydrOXYzine HCL 25 MG TABS: 25 | 30 days supply | Qty: 90 | Fill #2

## 2020-01-23 MED FILL — DULoxetine HCL 60 MG CPEP: 60 | 30 days supply | Qty: 30 | Fill #2

## 2020-02-03 ENCOUNTER — Other Ambulatory Visit: Payer: Self-pay

## 2020-02-03 ENCOUNTER — Telehealth (INDEPENDENT_AMBULATORY_CARE_PROVIDER_SITE_OTHER): Payer: Medicaid Other | Admitting: Psychiatry

## 2020-02-03 ENCOUNTER — Other Ambulatory Visit (HOSPITAL_COMMUNITY): Payer: Self-pay | Admitting: Psychiatry

## 2020-02-03 ENCOUNTER — Encounter (HOSPITAL_COMMUNITY): Payer: Self-pay | Admitting: Psychiatry

## 2020-02-03 DIAGNOSIS — F411 Generalized anxiety disorder: Secondary | ICD-10-CM

## 2020-02-03 DIAGNOSIS — F331 Major depressive disorder, recurrent, moderate: Secondary | ICD-10-CM

## 2020-02-03 MED ORDER — DULOXETINE HCL 60 MG PO CPEP: ORAL_CAPSULE | ORAL | 2 refills | Status: DC

## 2020-02-03 MED ORDER — HYDROXYZINE HCL 25 MG PO TABS: 25.0000 mg | ORAL_TABLET | Freq: Three times a day (TID) | ORAL | 2 refills | Status: DC

## 2020-02-03 MED FILL — hydrOXYzine HCL 25 MG TABS: 25 | 30 days supply | Qty: 90 | Fill #0

## 2020-02-03 NOTE — Progress Notes (Signed)
Virtual Visit via Telephone Note  I connected with Catherine Pope on 02/03/20 at 10:40 AM EDT by telephone and verified that I am speaking with the correct person using two identifiers.  Location: Patient: Home Provider: Home office   I discussed the limitations, risks, security and privacy concerns of performing an evaluation and management service by telephone and the availability of in person appointments. I also discussed with the patient that there may be a patient responsible charge related to this service. The patient expressed understanding and agreed to proceed.   History of Present Illness: Patient is evaluated by phone session.  She is taking the medication as prescribed.  She is sleeping better since taking 2 hydroxyzine at bedtime.  She is still taking 1 hydroxyzine during the day.  She is not as anxious and depressed.  She lives with her 3 adopted children and her biological daughter.  She does not have a lot of support but she is resilience.  She takes her children to therapy because she feels they still have a lot of issues.  She denies any crying spells or any feeling of hopelessness or worthlessness.  She is taking pain medication and gabapentin from other provider.  She has no tremors shakes or any rash.  She like to keep her current medication.  Patient is unemployed and taking care of 3 adopted children and her biological daughter.  Patient denies drinking or using any illegal substances.  She is trying to lose weight and she had lost few pounds since the last visit.  She also pleased that her hemoglobin A1c also improved and last reading was 7.8.  She sees her primary care physician Dr. Simona Huh.   Past Psychiatric History: No H/Oinpatient or any suicidal attempt. Seen at [facility] and given Seroquel, Effexor, BuSpar and Wellbutrin. Effexor cause grogginess, Seroquel cause weight gain increased blood sugar. Did not took Wellbutrin consistently. No H/Omania or  psychosis.  Psychiatric Specialty Exam: Physical Exam  Review of Systems  Weight 187 lb (84.8 kg).There is no height or weight on file to calculate BMI.  General Appearance: NA  Eye Contact:  NA  Speech:  Slow  Volume:  Decreased  Mood:  Euthymic  Affect:  NA  Thought Process:  Descriptions of Associations: Intact  Orientation:  Full (Time, Place, and Person)  Thought Content:  WDL  Suicidal Thoughts:  No  Homicidal Thoughts:  No  Memory:  Immediate;   Good Recent;   Fair Remote;   Fair  Judgement:  Intact  Insight:  Present  Psychomotor Activity:  NA  Concentration:  Concentration: Fair and Attention Span: Fair  Recall:  Good  Fund of Knowledge:  Good  Language:  Good  Akathisia:  No  Handed:  Right  AIMS (if indicated):     Assets:  Communication Skills Desire for Improvement Housing Transportation  ADL's:  Intact  Cognition:  WNL  Sleep:   ok      Assessment and Plan: Major depressive disorder, recurrent.  Generalized anxiety disorder.  Patient doing better since taking the hydroxyzine 50 mg at bedtime and she also takes 20 5 in the morning to help with anxiety.  She does not want to change the medication.  We will continue Cymbalta 60 mg daily.  She is also prescribed gabapentin and pain medicine which she takes from other provider.  At this time patient does not feel she need to see a therapist as she is busy taking care of her children for  therapy.  Discussed medication side effects and benefits.  Recommended to call us back if she is any question or any concern.  Follow-up in 3 months.  Follow Up Instructions:    I discussed the assessment and treatment plan with the patient. The patient was provided an opportunity to ask questions and all were answered. The patient agreed with the plan and demonstrated an understanding of the instructions.   The patient was advised to call back or seek an in-person evaluation if the symptoms worsen or if the condition fails  to improve as anticipated.  I provided 17 minutes of non-face-to-face time during this encounter.   Kathlee Nations, MD

## 2020-02-06 ENCOUNTER — Telehealth (HOSPITAL_COMMUNITY): Payer: Medicaid Other | Admitting: Psychiatry

## 2020-03-08 MED FILL — hydrOXYzine HCL 25 MG TABS: 25 | 30 days supply | Qty: 90 | Fill #1

## 2020-03-08 MED FILL — DULoxetine HCL 60 MG CPEP: 60 | 30 days supply | Qty: 30 | Fill #0

## 2020-04-13 MED FILL — DULoxetine HCL 60 MG CPEP: 60 | 30 days supply | Qty: 30 | Fill #1

## 2020-04-13 MED FILL — hydrOXYzine HCL 25 MG TABS: 25 | 30 days supply | Qty: 90 | Fill #2

## 2020-05-04 ENCOUNTER — Other Ambulatory Visit: Payer: Self-pay

## 2020-05-04 ENCOUNTER — Other Ambulatory Visit (HOSPITAL_COMMUNITY): Payer: Self-pay | Admitting: Psychiatry

## 2020-05-04 ENCOUNTER — Telehealth (INDEPENDENT_AMBULATORY_CARE_PROVIDER_SITE_OTHER): Payer: Medicaid Other | Admitting: Psychiatry

## 2020-05-04 ENCOUNTER — Encounter (HOSPITAL_COMMUNITY): Payer: Self-pay | Admitting: Psychiatry

## 2020-05-04 VITALS — Wt 189.0 lb

## 2020-05-04 DIAGNOSIS — F331 Major depressive disorder, recurrent, moderate: Secondary | ICD-10-CM | POA: Diagnosis not present

## 2020-05-04 DIAGNOSIS — F419 Anxiety disorder, unspecified: Secondary | ICD-10-CM | POA: Diagnosis not present

## 2020-05-04 MED ORDER — TRAZODONE HCL 50 MG PO TABS: 50.0000 mg | ORAL_TABLET | Freq: Every evening | ORAL | 1 refills | Status: DC | PRN

## 2020-05-04 MED ORDER — HYDROXYZINE HCL 25 MG PO TABS: 25.0000 mg | ORAL_TABLET | Freq: Three times a day (TID) | ORAL | 2 refills | Status: DC

## 2020-05-04 MED ORDER — DULOXETINE HCL 60 MG PO CPEP: ORAL_CAPSULE | ORAL | 2 refills | Status: DC

## 2020-05-04 MED FILL — traZODone HCL 50 MG TABS: 50 | 30 days supply | Qty: 30 | Fill #0

## 2020-05-04 NOTE — Progress Notes (Signed)
Virtual Visit via Telephone Note  I connected with Catherine Pope on 05/04/20 at 10:40 AM EST by telephone and verified that I am speaking with the correct person using two identifiers.  Location: Patient: Home Provider: Home Office   I discussed the limitations, risks, security and privacy concerns of performing an evaluation and management service by telephone and the availability of in person appointments. I also discussed with the patient that there may be a patient responsible charge related to this service. The patient expressed understanding and agreed to proceed.   History of Present Illness: Patient is evaluated by phone session.  She is on the phone by herself.  She endorsed lately not sleeping very well and keep waking up every 2 hours.  [facility] with [facility] Bobette Mo.  She is hoping therapy will help her anxiety.  Lately she is concerned about court cases about her adopted children.  Patient has given adoption but there are some old cases which are still not resolved.  She denies any suicidal thoughts but admitted some time feeling overwhelmed.  She reported her biggest issue is lack of sleep.  She denies drinking or using any illegal substances.  She also applied for disability and waiting to hear back.  Patient has 3 adopted children and her own biological daughter.  Her primary care physician is Dr. Simona Huh.  She has no tremors, shakes or any EPS.  She is taking hydroxyzine 25 mg 3 times a day to help with anxiety.  Patient told hydroxyzine does not help for her sleep and wondering if she can try something else.   Past Psychiatric History: No H/Oinpatient or any suicidal attempt. Seen at [facility] and given Seroquel, Effexor, BuSpar and Wellbutrin. Effexor cause grogginess, Seroquel cause weight gain increased blood sugar. Did not took Wellbutrin consistently. No H/Omania or psychosis.  Psychiatric Specialty Exam: Physical Exam  Review of  Systems  Weight 189 lb (85.7 kg).There is no height or weight on file to calculate BMI.  General Appearance: NA  Eye Contact:  NA  Speech:  Slow  Volume:  Decreased  Mood:  Dysphoric  Affect:  NA  Thought Process:  Goal Directed  Orientation:  Full (Time, Place, and Person)  Thought Content:  Logical  Suicidal Thoughts:  No  Homicidal Thoughts:  No  Memory:  Immediate;   Good Recent;   Good Remote;   Fair  Judgement:  Fair  Insight:  Present  Psychomotor Activity:  NA  Concentration:  Concentration: Fair and Attention Span: Fair  Recall:  Good  Fund of Knowledge:  Good  Language:  Good  Akathisia:  No  Handed:  Right  AIMS (if indicated):     Assets:  Communication Skills Desire for Improvement Housing Transportation  ADL's:  Intact  Cognition:  WNL  Sleep:   fair      Assessment and Plan: Major depressive disorder, recurrent.  Anxiety.  Encouraged to keep appointment with [facility] at [facility].  Recommend to try melatonin and if that did not help then she can try low-dose trazodone as needed for insomnia.  Patient agreed with the plan.  We will continue hydroxyzine 25 mg up to 3 times a day, Cymbalta 60 mg daily and trazodone as needed 50 mg for insomnia.  Recommended to call us back if is any question or any concern.  Follow-up in 3 months.  Follow Up Instructions:    I discussed the assessment and treatment plan with the patient.  The patient was provided an opportunity to ask questions and all were answered. The patient agreed with the plan and demonstrated an understanding of the instructions.   The patient was advised to call back or seek an in-person evaluation if the symptoms worsen or if the condition fails to improve as anticipated.  I provided 13 minutes of non-face-to-face time during this encounter.   Kathlee Nations, MD

## 2020-05-10 MED FILL — DULoxetine HCL 60 MG CPEP: 60 | 30 days supply | Qty: 30 | Fill #2

## 2020-06-02 MED FILL — traZODone HCL 50 MG TABS: 50 | 30 days supply | Qty: 30 | Fill #1

## 2020-06-02 MED FILL — hydrOXYzine HCL 25 MG TABS: 25 | 90 days supply | Qty: 270 | Fill #0

## 2020-06-02 MED FILL — DULoxetine HCL 60 MG CPEP: 60 | 90 days supply | Qty: 90 | Fill #0

## 2020-06-17 ENCOUNTER — Ambulatory Visit: Payer: Medicaid Other | Admitting: Physical Therapy

## 2020-07-03 ENCOUNTER — Other Ambulatory Visit: Payer: Self-pay

## 2020-08-02 ENCOUNTER — Telehealth (HOSPITAL_COMMUNITY): Payer: Medicaid Other | Admitting: Psychiatry

## 2020-08-06 ENCOUNTER — Other Ambulatory Visit: Payer: Self-pay

## 2020-08-06 ENCOUNTER — Telehealth (HOSPITAL_COMMUNITY): Payer: Medicaid Other | Admitting: Psychiatry

## 2020-08-18 ENCOUNTER — Other Ambulatory Visit: Payer: Self-pay

## 2020-08-18 ENCOUNTER — Telehealth (INDEPENDENT_AMBULATORY_CARE_PROVIDER_SITE_OTHER): Payer: Medicaid Other | Admitting: Psychiatry

## 2020-08-18 ENCOUNTER — Telehealth (HOSPITAL_COMMUNITY): Payer: Medicaid Other | Admitting: Psychiatry

## 2020-08-18 ENCOUNTER — Encounter (HOSPITAL_COMMUNITY): Payer: Self-pay | Admitting: Psychiatry

## 2020-08-18 DIAGNOSIS — F419 Anxiety disorder, unspecified: Secondary | ICD-10-CM

## 2020-08-18 DIAGNOSIS — F331 Major depressive disorder, recurrent, moderate: Secondary | ICD-10-CM

## 2020-08-18 MED ORDER — TRAZODONE HCL 100 MG PO TABS
100.0000 mg | ORAL_TABLET | Freq: Every evening | ORAL | 1 refills | Status: DC | PRN
  Filled 2020-08-18: qty 30, 30d supply, fill #0

## 2020-08-18 MED ORDER — HYDROXYZINE HCL 25 MG PO TABS
ORAL_TABLET | Freq: Three times a day (TID) | ORAL | 2 refills | Status: DC
  Filled 2020-08-18: qty 90, 30d supply, fill #0

## 2020-08-18 MED ORDER — DULOXETINE HCL 30 MG PO CPEP
90.0000 mg | ORAL_CAPSULE | Freq: Every day | ORAL | 2 refills | Status: DC
  Filled 2020-08-18: qty 90, 30d supply, fill #0
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
## 2020-08-19 ENCOUNTER — Other Ambulatory Visit: Payer: Self-pay

## 2020-08-24 ENCOUNTER — Other Ambulatory Visit: Payer: Self-pay

## 2020-10-18 ENCOUNTER — Other Ambulatory Visit (HOSPITAL_COMMUNITY): Payer: Self-pay | Admitting: Gastroenterology

## 2020-10-18 DIAGNOSIS — R112 Nausea with vomiting, unspecified: Secondary | ICD-10-CM

## 2020-10-28 ENCOUNTER — Encounter (HOSPITAL_COMMUNITY)
Admission: RE | Admit: 2020-10-28 | Discharge: 2020-10-28 | Disposition: A | Discharge status: Home / Self Care | Payer: Medicaid Other | Source: Ambulatory Visit | Attending: Gastroenterology | Admitting: Gastroenterology

## 2020-10-28 ENCOUNTER — Other Ambulatory Visit: Payer: Self-pay

## 2020-10-28 DIAGNOSIS — R112 Nausea with vomiting, unspecified: Secondary | ICD-10-CM | POA: Diagnosis present

## 2020-10-28 MED ORDER — TECHNETIUM TC 99M MEBROFENIN IV KIT
5.5000 | PACK | Freq: Once | INTRAVENOUS | Status: AC | PRN
  Administered 2020-10-28: 5.5 via INTRAVENOUS

## 2020-11-22 ENCOUNTER — Other Ambulatory Visit: Payer: Self-pay

## 2020-11-22 ENCOUNTER — Encounter (HOSPITAL_COMMUNITY): Payer: Self-pay | Admitting: Psychiatry

## 2020-11-22 ENCOUNTER — Telehealth (INDEPENDENT_AMBULATORY_CARE_PROVIDER_SITE_OTHER): Payer: Medicaid Other | Admitting: Psychiatry

## 2020-11-22 DIAGNOSIS — F331 Major depressive disorder, recurrent, moderate: Secondary | ICD-10-CM | POA: Diagnosis not present

## 2020-11-22 DIAGNOSIS — F419 Anxiety disorder, unspecified: Secondary | ICD-10-CM | POA: Diagnosis not present

## 2020-11-22 MED ORDER — TRAZODONE HCL 100 MG PO TABS
100.0000 mg | ORAL_TABLET | Freq: Every evening | ORAL | 1 refills | Status: DC | PRN
  Filled 2020-11-22: qty 30, 30d supply, fill #0

## 2020-11-22 MED ORDER — DULOXETINE HCL 30 MG PO CPEP
90.0000 mg | ORAL_CAPSULE | Freq: Every day | ORAL | 2 refills | Status: DC
  Filled 2020-11-22: qty 90, 30d supply, fill #0
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
## 2020-11-24 ENCOUNTER — Other Ambulatory Visit: Payer: Self-pay

## 2020-11-25 ENCOUNTER — Other Ambulatory Visit: Payer: Self-pay

## 2020-12-15 ENCOUNTER — Encounter (HOSPITAL_COMMUNITY): Payer: Self-pay | Admitting: Medical Oncology

## 2020-12-15 ENCOUNTER — Other Ambulatory Visit: Payer: Self-pay

## 2020-12-15 ENCOUNTER — Ambulatory Visit (HOSPITAL_COMMUNITY)
Admission: EM | Admit: 2020-12-15 | Discharge: 2020-12-15 | Disposition: A | Discharge status: Other Acute Inpatient Hospital | Payer: Medicaid Other

## 2020-12-15 ENCOUNTER — Encounter (HOSPITAL_BASED_OUTPATIENT_CLINIC_OR_DEPARTMENT_OTHER): Payer: Self-pay | Admitting: Emergency Medicine

## 2020-12-15 ENCOUNTER — Emergency Department (HOSPITAL_BASED_OUTPATIENT_CLINIC_OR_DEPARTMENT_OTHER)
Admission: EM | Admit: 2020-12-15 | Discharge: 2020-12-15 | Disposition: A | Discharge status: Home / Self Care | Payer: Medicaid Other | Source: Home / Self Care | Attending: Emergency Medicine | Admitting: Emergency Medicine

## 2020-12-15 ENCOUNTER — Emergency Department (HOSPITAL_BASED_OUTPATIENT_CLINIC_OR_DEPARTMENT_OTHER): Payer: Medicaid Other

## 2020-12-15 ENCOUNTER — Emergency Department (HOSPITAL_COMMUNITY): Payer: Medicaid Other

## 2020-12-15 ENCOUNTER — Emergency Department (HOSPITAL_COMMUNITY)
Admission: EM | Admit: 2020-12-15 | Discharge: 2020-12-15 | Discharge status: Against Medical Advice | Payer: Medicaid Other | Attending: Emergency Medicine | Admitting: Emergency Medicine

## 2020-12-15 DIAGNOSIS — R109 Unspecified abdominal pain: Secondary | ICD-10-CM | POA: Insufficient documentation

## 2020-12-15 DIAGNOSIS — F17210 Nicotine dependence, cigarettes, uncomplicated: Secondary | ICD-10-CM | POA: Insufficient documentation

## 2020-12-15 DIAGNOSIS — R000 Tachycardia, unspecified: Secondary | ICD-10-CM | POA: Diagnosis not present

## 2020-12-15 DIAGNOSIS — I160 Hypertensive urgency: Secondary | ICD-10-CM | POA: Diagnosis not present

## 2020-12-15 DIAGNOSIS — R112 Nausea with vomiting, unspecified: Secondary | ICD-10-CM | POA: Insufficient documentation

## 2020-12-15 DIAGNOSIS — B9689 Other specified bacterial agents as the cause of diseases classified elsewhere: Secondary | ICD-10-CM | POA: Insufficient documentation

## 2020-12-15 DIAGNOSIS — I10 Essential (primary) hypertension: Secondary | ICD-10-CM | POA: Insufficient documentation

## 2020-12-15 DIAGNOSIS — E119 Type 2 diabetes mellitus without complications: Secondary | ICD-10-CM | POA: Insufficient documentation

## 2020-12-15 DIAGNOSIS — R0682 Tachypnea, not elsewhere classified: Secondary | ICD-10-CM | POA: Insufficient documentation

## 2020-12-15 DIAGNOSIS — Z794 Long term (current) use of insulin: Secondary | ICD-10-CM | POA: Insufficient documentation

## 2020-12-15 DIAGNOSIS — K5792 Diverticulitis of intestine, part unspecified, without perforation or abscess without bleeding: Secondary | ICD-10-CM | POA: Insufficient documentation

## 2020-12-15 DIAGNOSIS — R1031 Right lower quadrant pain: Secondary | ICD-10-CM | POA: Diagnosis not present

## 2020-12-15 DIAGNOSIS — R197 Diarrhea, unspecified: Secondary | ICD-10-CM | POA: Diagnosis not present

## 2020-12-15 DIAGNOSIS — Z79899 Other long term (current) drug therapy: Secondary | ICD-10-CM | POA: Insufficient documentation

## 2020-12-15 DIAGNOSIS — Z5321 Procedure and treatment not carried out due to patient leaving prior to being seen by health care provider: Secondary | ICD-10-CM | POA: Insufficient documentation

## 2020-12-15 LAB — CBC WITH DIFFERENTIAL/PLATELET
Abs Immature Granulocytes: 0.12 10*3/uL — ABNORMAL HIGH (ref 0.00–0.07)
Basophils Absolute: 0.1 10*3/uL (ref 0.0–0.1)
Basophils Relative: 0 %
Eosinophils Absolute: 0 10*3/uL (ref 0.0–0.5)
Eosinophils Relative: 0 %
HCT: 43.9 % (ref 36.0–46.0)
Hemoglobin: 14.9 g/dL (ref 12.0–15.0)
Immature Granulocytes: 1 %
Lymphocytes Relative: 18 %
Lymphs Abs: 3.6 10*3/uL (ref 0.7–4.0)
MCH: 27.7 pg (ref 26.0–34.0)
MCHC: 33.9 g/dL (ref 30.0–36.0)
MCV: 81.6 fL (ref 80.0–100.0)
Monocytes Absolute: 1.6 10*3/uL — ABNORMAL HIGH (ref 0.1–1.0)
Monocytes Relative: 8 %
Neutro Abs: 14.9 10*3/uL — ABNORMAL HIGH (ref 1.7–7.7)
Neutrophils Relative %: 73 %
Platelets: 374 10*3/uL (ref 150–400)
RBC: 5.38 MIL/uL — ABNORMAL HIGH (ref 3.87–5.11)
RDW: 13.1 % (ref 11.5–15.5)
WBC: 20.3 10*3/uL — ABNORMAL HIGH (ref 4.0–10.5)
nRBC: 0 % (ref 0.0–0.2)

## 2020-12-15 LAB — LIPASE, BLOOD
Lipase: 32 U/L (ref 11–51)
Lipase: 35 U/L (ref 11–51)

## 2020-12-15 LAB — CBC
HCT: 42.8 % (ref 36.0–46.0)
Hemoglobin: 15.3 g/dL — ABNORMAL HIGH (ref 12.0–15.0)
MCH: 28.3 pg (ref 26.0–34.0)
MCHC: 35.7 g/dL (ref 30.0–36.0)
MCV: 79.1 fL — ABNORMAL LOW (ref 80.0–100.0)
Platelets: 393 10*3/uL (ref 150–400)
RBC: 5.41 MIL/uL — ABNORMAL HIGH (ref 3.87–5.11)
RDW: 13.3 % (ref 11.5–15.5)
WBC: 19.7 10*3/uL — ABNORMAL HIGH (ref 4.0–10.5)
nRBC: 0 % (ref 0.0–0.2)

## 2020-12-15 LAB — COMPREHENSIVE METABOLIC PANEL
ALT: 16 U/L (ref 0–44)
ALT: 21 U/L (ref 0–44)
AST: 25 U/L (ref 15–41)
AST: 31 U/L (ref 15–41)
Albumin: 4.4 g/dL (ref 3.5–5.0)
Albumin: 4.7 g/dL (ref 3.5–5.0)
Alkaline Phosphatase: 77 U/L (ref 38–126)
Alkaline Phosphatase: 77 U/L (ref 38–126)
Anion gap: 15 (ref 5–15)
Anion gap: 13 (ref 5–15)
BUN: 11 mg/dL (ref 6–20)
BUN: 14 mg/dL (ref 6–20)
CO2: 18 mmol/L — ABNORMAL LOW (ref 22–32)
CO2: 21 mmol/L — ABNORMAL LOW (ref 22–32)
Calcium: 10.2 mg/dL (ref 8.9–10.3)
Calcium: 9.9 mg/dL (ref 8.9–10.3)
Chloride: 100 mmol/L (ref 98–111)
Chloride: 99 mmol/L (ref 98–111)
Creatinine, Ser: 0.77 mg/dL (ref 0.44–1.00)
Creatinine, Ser: 0.8 mg/dL (ref 0.44–1.00)
GFR, Estimated: >60 mL/min (ref >60)
GFR, Estimated: >60 mL/min (ref >60)
Glucose, Bld: 166 mg/dL — ABNORMAL HIGH (ref 70–99)
Glucose, Bld: 181 mg/dL — ABNORMAL HIGH (ref 70–99)
Potassium: 3.2 mmol/L — ABNORMAL LOW (ref 3.5–5.1)
Potassium: 3.2 mmol/L — ABNORMAL LOW (ref 3.5–5.1)
Sodium: 133 mmol/L — ABNORMAL LOW (ref 135–145)
Sodium: 133 mmol/L — ABNORMAL LOW (ref 135–145)
Total Bilirubin: 0.8 mg/dL (ref 0.3–1.2)
Total Bilirubin: 0.7 mg/dL (ref 0.3–1.2)
Total Protein: 8.6 g/dL — ABNORMAL HIGH (ref 6.5–8.1)
Total Protein: 9.1 g/dL — ABNORMAL HIGH (ref 6.5–8.1)

## 2020-12-15 LAB — I-STAT BETA HCG BLOOD, ED (MC, WL, AP ONLY): I-stat hCG, quantitative: <5 m[IU]/mL (ref <5)

## 2020-12-15 LAB — URINALYSIS, ROUTINE W REFLEX MICROSCOPIC
Bilirubin Urine: NEGATIVE
Glucose, UA: NEGATIVE mg/dL
Ketones, ur: 80 mg/dL — AB
Leukocytes,Ua: NEGATIVE
Nitrite: NEGATIVE
Protein, ur: 100 mg/dL — AB
Specific Gravity, Urine: 1.03 (ref 1.005–1.030)
pH: 6.5 (ref 5.0–8.0)

## 2020-12-15 LAB — I-STAT VENOUS BLOOD GAS, ED
Acid-Base Excess: 3 mmol/L — ABNORMAL HIGH (ref 0.0–2.0)
Bicarbonate: 23.8 mmol/L (ref 20.0–28.0)
Calcium, Ion: 1.11 mmol/L — ABNORMAL LOW (ref 1.15–1.40)
HCT: 46 % (ref 36.0–46.0)
Hemoglobin: 15.6 g/dL — ABNORMAL HIGH (ref 12.0–15.0)
O2 Saturation: 99 %
Potassium: 3.2 mmol/L — ABNORMAL LOW (ref 3.5–5.1)
Sodium: 135 mmol/L (ref 135–145)
TCO2: 25 mmol/L (ref 22–32)
pCO2, Ven: 25.6 mmHg — ABNORMAL LOW (ref 44.0–60.0)
pH, Ven: 7.576 — ABNORMAL HIGH (ref 7.250–7.430)
pO2, Ven: 99 mmHg — ABNORMAL HIGH (ref 32.0–45.0)

## 2020-12-15 LAB — URINALYSIS, MICROSCOPIC (REFLEX)

## 2020-12-15 LAB — LACTIC ACID, PLASMA: Lactic Acid, Venous: 1.9 mmol/L (ref 0.5–1.9)

## 2020-12-15 LAB — PREGNANCY, URINE: Preg Test, Ur: NEGATIVE

## 2020-12-15 MED ORDER — METOCLOPRAMIDE HCL 5 MG/ML IJ SOLN
10.0000 mg | Freq: Once | INTRAMUSCULAR | Status: AC
  Administered 2020-12-15: 10 mg via INTRAVENOUS
  Filled 2020-12-15: qty 2

## 2020-12-15 MED ORDER — HYDROMORPHONE HCL 1 MG/ML IJ SOLN
1.0000 mg | Freq: Once | INTRAMUSCULAR | Status: AC
Start: 2020-12-15 — End: 2020-12-15
  Administered 2020-12-15: 1 mg via INTRAVENOUS
  Filled 2020-12-15: qty 1

## 2020-12-15 MED ORDER — ONDANSETRON HCL 4 MG/2ML IJ SOLN
4.0000 mg | Freq: Once | INTRAMUSCULAR | Status: AC | PRN
  Administered 2020-12-15: 4 mg via INTRAVENOUS

## 2020-12-15 MED ORDER — LACTATED RINGERS IV BOLUS
1000.0000 mL | Freq: Once | INTRAVENOUS | Status: AC
  Administered 2020-12-15: 1000 mL via INTRAVENOUS

## 2020-12-15 MED ORDER — ONDANSETRON HCL 4 MG/2ML IJ SOLN
INTRAMUSCULAR | Status: AC
  Administered 2020-12-15: 4 mg via INTRAVENOUS
  Filled 2020-12-15: qty 2

## 2020-12-15 MED ORDER — IOHEXOL 350 MG/ML SOLN
85.0000 mL | Freq: Once | INTRAVENOUS | Status: AC | PRN
  Administered 2020-12-15: 85 mL via INTRAVENOUS

## 2020-12-15 MED ORDER — ONDANSETRON 4 MG PO TBDP: 4.0000 mg | ORAL_TABLET | Freq: Once | ORAL | Status: DC

## 2020-12-15 MED ORDER — AMOXICILLIN-POT CLAVULANATE 875-125 MG PO TABS: 1.0000 | ORAL_TABLET | Freq: Two times a day (BID) | ORAL | 0 refills | Status: AC

## 2020-12-15 MED ORDER — AMOXICILLIN-POT CLAVULANATE 875-125 MG PO TABS: 1.0000 | ORAL_TABLET | Freq: Three times a day (TID) | ORAL | 0 refills | Status: DC

## 2020-12-15 MED ORDER — ONDANSETRON 4 MG PO TBDP: 4.0000 mg | ORAL_TABLET | Freq: Three times a day (TID) | ORAL | 0 refills | Status: DC | PRN

## 2020-12-15 NOTE — ED Notes (Signed)
Pt left AMA due to wait time  

## 2020-12-15 NOTE — Discharge Instructions (Signed)
You have diverticulitis.  There is a prescription attached for antibiotics.  Take as prescribed.  There is an additional prescription attached for Zofran.  This treats nausea.  Take this only as needed.  Start with clear liquids and broths only.  Increase the variety of foods you eat slowly to avoid worsening of symptoms.  If you develop any worsening symptoms at home, please return to the emergency department.  This is a condition that some people do need to be hospitalized for.

## 2020-12-15 NOTE — ED Notes (Signed)
[facility] called, and they told us they do not have any truck available, and contact EMS.   EMS called, cardiac monitor requested.   ED charge nurse notified pt will go by EMS.

## 2020-12-15 NOTE — ED Triage Notes (Signed)
Pt reports right lower quadrant abdominal pain and diarrhea since last night; vomited 3 times this morning. Reports EMS checked on her today and blood pressure was 240/100. Pt reports she has not being able to take her blood pressure for the past 2 days.

## 2020-12-15 NOTE — ED Provider Notes (Signed)
She isEmergency Medicine Provider Triage Evaluation Note  Catherine Pope , a 55 y.o. female  was evaluated in triage.  Pt complains of 2 days of lower abdominal pain seems a worse on the left side than the right nausea vomiting diarrhea she denies any bloody emesis or diarrhea.  States that she is counts episodes of diarrhea and vomited 3-4 times already today.  Denies any chest pain or shortness of breath lightheadedness or dizziness.  She is unvaccinated against COVID-19. She has not been able to her home medications due to nausea.  She is on Ozempic for glycemic control.   Review of Systems  Positive: Nausea, vomiting, diarrhea Negative: Fever  Physical Exam  BP (!) 171/115 (BP Location: Right Arm)   Pulse (!) 101   Temp 99.2 F (37.3 C) (Oral)   Resp 20   SpO2 100%  Gen:   Awake, quite anxious, with redirection taking deep breaths and breathing more slowly however prior to redirection patient quite anxious and moving around in bed frequently. Resp:  Normal effort, mild tachypnea MSK:   Moves extremities without difficulty  Other:  Abdomen is soft not particularly tender.  No guarding or rebound. Some very mild tenderness palpation left lower quadrant.  No CVA tenderness.  Medical Decision Making  Medically screening exam initiated at 11:43 AM.  Appropriate orders placed.  Vauda Salvucci was informed that the remainder of the evaluation will be completed by another provider, this initial triage assessment does not replace that evaluation, and the importance of remaining in the ED until their evaluation is complete.  Patient's pain seems somewhat out of proportion to my examination. She seems to be having GI symptoms consisting of nausea vomiting diarrhea abdominal pain Will check VBG to ensure that she is not acidotic and lactic in the setting of very low suspicion for ischemic colitis.  Overall doubt DKA, ischemic colitis, ovarian torsion or perforation.  Her symptoms  been ongoing for 2 days and she has had nausea vomiting and diarrhea which makes the above differential less likely.  Patient will be priority to move back to major care.   Pati Gallo [place], [place] 12/15/20 1147    Elnora Morrison, MD 12/16/20 1123

## 2020-12-15 NOTE — ED Provider Notes (Signed)
[facility] Provider Note   CSN: [id number] Arrival date & time: 12/15/20  1711     History Chief Complaint  Patient presents with   Abdominal Pain    Catherine Pope is a 55 y.o. female.   Abdominal Pain Associated symptoms: diarrhea, nausea and vomiting   Associated symptoms: no chest pain, no chills, no cough, no dysuria, no fever, no hematuria, no shortness of breath, no sore throat, no vaginal bleeding and no vaginal discharge   Patient presents for abdominal pain and diarrhea that has been worsening over the past 3 days.  She denies any history of similar abdominal pain.  Medical history is notable for DM and HTN.  Abdominal pain is maximal in the left lower quadrant.  She has had nausea and vomiting.  She denies fevers or chills.  She denies urinary or vaginal symptoms.  Earlier today, she was seen in urgent care and advised to go to the ED.  She reported to [facility], ED, during which time, she was able to undergo lab work.  She left due to the long wait times.  Currently, she endorses continued severe left lower quadrant abdominal pain and nausea.    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   per pt tested a month ago/ no results yet   Hypertension    Migraine    Neck pain    Seasonal allergies    Sinusitis     Patient Active Problem List   Diagnosis Date Noted   Right lumbar radiculopathy 06/03/2019   Diabetes ([place]) 10/15/2018   LGSIL on Pap smear of cervix 12/28/2017   Lumbosacral disc herniation 09/12/2017   Hoarseness 04/30/2017   Laryngopharyngeal reflux (LPR) 04/30/2017   Perennial allergic rhinitis 04/30/2017   Chronic pansinusitis 04/30/2017   Cervical vertebral fusion 12/27/2016   Essential hypertension 03/23/2016   Laryngitis, acute 03/23/2016   Neuropathy  03/23/2016   Tobacco dependence 03/23/2016   Morbid obesity ([place]) 11/26/2013   Migraine without aura 11/26/2013   Other and unspecified hyperlipidemia 11/26/2013   Low back pain 03/23/2013    Past Surgical History:  Procedure Laterality Date   ABDOMINAL HYSTERECTOMY     per pt , in her 25's   ANTERIOR CERVICAL DECOMP/DISCECTOMY FUSION N/A 12/27/2016   Procedure: CERVICAL SIX-SEVEN ANTERIOR CERVICAL DECOMPRESSION/DISCECTOMY FUSION;  Surgeon: Eustace Moore, MD;  Location: [place];  Service: Neurosurgery;  Laterality: N/A;   ANTERIOR CRUCIATE LIGAMENT REPAIR     right knee   BREAST EXCISIONAL BIOPSY Left over 20 years ago   benign   BREAST SURGERY     2 lumps removed at age 60, left breast/ benign   RADIOLOGY WITH ANESTHESIA Left 12/11/2017   Procedure: MRI cervical spine and left shoulder WITH ANESTHESIA;  Surgeon: Radiologist, Medication, MD;  Location: [place];  Service: Radiology;  Laterality: Left;   SHOULDER ARTHROSCOPY     right shoulder     OB History     Gravida  2   Para  2   Term  2   Preterm      AB      Living  2      SAB      IAB      Ectopic  Multiple      Live Births  2           Family History  Problem Relation Age of Onset   Hypertension Mother    Heart Problems Father    Diabetes Father    Hypertension Other    Heart disease Other    Stroke Sister    Kidney disease Brother     Social History   Tobacco Use   Smoking status: Every Day    Packs/day: 0.50    Years: 15.00    Pack years: 7.50    Types: Cigarettes   Smokeless tobacco: Never   Tobacco comments:    less than half pack a day.  Vaping Use   Vaping Use: Never used  Substance Use Topics   Alcohol use: No   Drug use: No    Home Medications Prior to Admission medications   Medication Sig Start Date End Date Taking? Authorizing Provider  ondansetron (ZOFRAN ODT) 4 MG disintegrating tablet Take 1 tablet (4 mg total) by mouth every 8 (eight) hours as needed for up  to 12 doses for nausea or vomiting. 12/15/20  Yes Godfrey Pick, MD  albuterol (PROVENTIL HFA;VENTOLIN HFA) 108 (90 Base) MCG/ACT inhaler Inhale 1-2 puffs into the lungs every 6 (six) hours as needed for wheezing or shortness of breath. 08/15/17   Wieters, Hallie C, PA-C  allopurinol (ZYLOPRIM) 100 MG tablet Take 100 mg by mouth daily. 07/31/18   [provider]  amoxicillin-clavulanate (AUGMENTIN) 875-125 MG tablet Take 1 tablet by mouth 2 (two) times daily for 5 days. 12/15/20 12/20/20  Godfrey Pick, MD  Cholecalciferol (VITAMIN D PO) Take 500 Units by mouth daily.     [provider]  Cyanocobalamin (VITAMIN B 12 PO) Take 1,000 mg by mouth daily.    [provider]  DULoxetine (CYMBALTA) 30 MG capsule Take 3 capsules (90 mg total) by mouth daily. 11/22/20 11/22/21  Arfeen, Arlyce Harman, MD  fluticasone (FLONASE) 50 MCG/ACT nasal spray Place 2 sprays into both nostrils as needed. 08/20/18   Azzie Glatter, FNP  gabapentin (NEURONTIN) 600 MG tablet Take 600 mg by mouth 3 (three) times daily.    [provider]  glucose blood (TRUE METRIX BLOOD GLUCOSE TEST) test strip Use as instructed 03/23/16   Dorena Dew, FNP  hydrochlorothiazide (HYDRODIURIL) 25 MG tablet Take 1 tablet (25 mg total) by mouth daily. 08/20/18   Azzie Glatter, FNP  hydrOXYzine (ATARAX/VISTARIL) 25 MG tablet TAKE 1 TABLET (25 MG TOTAL) BY MOUTH 3 (THREE) TIMES DAILY. Patient not taking: Reported on 11/22/2020 08/18/20 08/18/21  Kathlee Nations, MD  Insulin Glargine (LANTUS SOLOSTAR) 100 UNIT/ML Solostar Pen Inject 155 Units into the skin every morning. And pen needles 1/day 10/15/18   Renato Shin, MD  Lancets [facility] ULTRASOFT) lancets Use as instructed 06/22/14   Ernestina Patches, MD  lisinopril (ZESTRIL) 20 MG tablet Take 1 tablet (20 mg total) by mouth daily. 08/20/18   Azzie Glatter, FNP  lovastatin (MEVACOR) 20 MG tablet TAKE 1 TABLET (20 MG TOTAL) BY MOUTH AT BEDTIME. 06/02/19   Azzie Glatter,  FNP  Oxycodone HCl 10 MG TABS Take 10 mg by mouth in the morning, at noon, and at bedtime.    [provider]  Semaglutide, 1 MG/DOSE, (OZEMPIC, 1 MG/DOSE,) 2 MG/1.5ML SOPN Inject 1 mg into the skin once a week. 10/16/18   Renato Shin, MD  TIZANIDINE HCL PO Take 1 tablet by mouth in the  morning, at noon, and at bedtime.    [provider]  topiramate (TOPAMAX) 50 MG tablet TAKE 2 TABLETS AT NIGHT AND 1 TABLET 12 HOURS AFTER DAILY. Patient not taking: Reported on 11/22/2020 09/06/18   Azzie Glatter, FNP  traZODone (DESYREL) 100 MG tablet Take 1 tablet (100 mg total) by mouth at bedtime as needed for sleep. 11/22/20 11/22/21  Arfeen, Arlyce Harman, MD    Allergies    Patient has no known allergies.  Review of Systems   Review of Systems  Constitutional:  Positive for appetite change. Negative for chills and fever.  HENT:  Negative for ear pain and sore throat.   Eyes:  Negative for pain and visual disturbance.  Respiratory:  Negative for cough, chest tightness and shortness of breath.   Cardiovascular:  Negative for chest pain and palpitations.  Gastrointestinal:  Positive for abdominal pain, diarrhea, nausea and vomiting.  Genitourinary:  Negative for dysuria, flank pain, hematuria, pelvic pain, vaginal bleeding and vaginal discharge.  Musculoskeletal:  Negative for arthralgias and back pain.  Skin:  Negative for color change and rash.  Neurological:  Negative for dizziness, seizures, syncope, weakness, light-headedness, numbness and headaches.  Hematological:  Does not bruise/bleed easily.  All other systems reviewed and are negative.  Physical Exam Updated Vital Signs BP (!) 157/75 (BP Location: Right Arm)   Pulse 88   Temp 98.5 F (36.9 C) (Oral)   Resp 16   Ht 5\' 6"  (1.676 m)   Wt 86 kg   SpO2 100%   BMI 30.60 kg/m   Physical Exam Vitals and nursing note reviewed.  Constitutional:      General: She is not in acute distress.    Appearance: She is  well-developed. She is not ill-appearing, toxic-appearing or diaphoretic.  HENT:     Head: Normocephalic and atraumatic.  Eyes:     Conjunctiva/sclera: Conjunctivae normal.  Cardiovascular:     Rate and Rhythm: Regular rhythm. Tachycardia present.     Heart sounds: No murmur heard. Pulmonary:     Effort: Pulmonary effort is normal. Tachypnea present. No respiratory distress.     Breath sounds: Normal breath sounds. No wheezing or rales.  Chest:     Chest wall: No tenderness.  Abdominal:     Palpations: Abdomen is soft.     Tenderness: There is abdominal tenderness in the left lower quadrant. There is no right CVA tenderness, left CVA tenderness, guarding or rebound.  Musculoskeletal:     Cervical back: Neck supple.  Skin:    General: Skin is warm and dry.     Coloration: Skin is not jaundiced or pale.  Neurological:     General: No focal deficit present.     Mental Status: She is alert and oriented to person, place, and time.     Cranial Nerves: No cranial nerve deficit.     Motor: No weakness.  Psychiatric:        Mood and Affect: Mood is anxious.        Behavior: Behavior normal.    ED Results / Procedures / Treatments   Labs (all labs ordered are listed, but only abnormal results are displayed) Labs Reviewed  COMPREHENSIVE METABOLIC PANEL - Abnormal; Notable for the following components:      Result Value   Sodium 133 (*)    Potassium 3.2 (*)    CO2 21 (*)    Glucose, Bld 181 (*)    Total Protein 9.1 (*)    All  other components within normal limits  CBC - Abnormal; Notable for the following components:   WBC 19.7 (*)    RBC 5.41 (*)    Hemoglobin 15.3 (*)    MCV 79.1 (*)    All other components within normal limits  URINALYSIS, ROUTINE W REFLEX MICROSCOPIC - Abnormal; Notable for the following components:   APPearance CLOUDY (*)    Hgb urine dipstick LARGE (*)    Ketones, ur 80 (*)    Protein, ur 100 (*)    All other components within normal limits   URINALYSIS, MICROSCOPIC (REFLEX) - Abnormal; Notable for the following components:   Bacteria, UA MANY (*)    All other components within normal limits  LIPASE, BLOOD  PREGNANCY, URINE    EKG None  Radiology DG Chest 2 View  Result Date: 12/15/2020 CLINICAL DATA:  Abdominal pain shortness of breath EXAM: CHEST - 2 VIEW COMPARISON:  Chest radiograph 08/15/2017 FINDINGS: The cardiomediastinal silhouette is stable. The lungs are well inflated. There is no focal consolidation or pulmonary edema. There is no pleural effusion or pneumothorax. Widening of the right AC joint is unchanged since 2018. There is no acute osseous abnormality. Cervical spine fusion hardware is again noted. IMPRESSION: No radiographic evidence of acute cardiopulmonary process. Electronically Signed   By: Valetta Mole M.D.   On: 12/15/2020 12:24   CT ABDOMEN PELVIS W CONTRAST  Result Date: 12/15/2020 CLINICAL DATA:  Nausea and vomiting EXAM: CT ABDOMEN AND PELVIS WITH CONTRAST TECHNIQUE: Multidetector CT imaging of the abdomen and pelvis was performed using the standard protocol following bolus administration of intravenous contrast. CONTRAST:  98mL OMNIPAQUE IOHEXOL 350 MG/ML SOLN COMPARISON:  CT abdomen and pelvis dated October 25, 2003 FINDINGS: Lower chest: No acute abnormality. Cystic lesion of the left lower lobe, likely a pneumatocele. Hepatobiliary: No focal liver abnormality is seen. No gallstones, gallbladder wall thickening, or biliary dilatation. Pancreas: Unremarkable. No pancreatic ductal dilatation or surrounding inflammatory changes. Spleen: Normal in size without focal abnormality. Adrenals/Urinary Tract: Adrenal glands are unremarkable. Kidneys are normal, without renal calculi, focal lesion, or hydronephrosis. Bladder is decompressed. Stomach/Bowel: Stomach is within normal limits. Appendix appears normal. Wall thickening of the sigmoid colon with inflamed diverticula. No evidence of obstruction.  Vascular/Lymphatic: Aortic atherosclerosis. No enlarged abdominal or pelvic lymph nodes. Reproductive: Status post hysterectomy. No adnexal masses. Other: No abdominal wall hernia or abnormality. No abdominopelvic ascites. Musculoskeletal: No acute or significant osseous findings. IMPRESSION: Findings compatible with acute uncomplicated diverticulitis. Aortic Atherosclerosis (ICD10-I70.0). Electronically Signed   By: Yetta Glassman M.D.   On: 12/15/2020 19:50    Procedures Procedures   Medications Ordered in ED Medications  ondansetron (ZOFRAN) injection 4 mg (4 mg Intravenous Given 12/15/20 1737)  lactated ringers bolus 1,000 mL (0 mLs Intravenous Stopped 12/15/20 2055)  HYDROmorphone (DILAUDID) injection 1 mg (1 mg Intravenous Given 12/15/20 1947)  metoCLOPramide (REGLAN) injection 10 mg (10 mg Intravenous Given 12/15/20 1945)  iohexol (OMNIPAQUE) 350 MG/ML injection 85 mL (85 mLs Intravenous Contrast Given 12/15/20 1941)    ED Course  I have reviewed the triage vital signs and the nursing notes.  Pertinent labs & imaging results that were available during my care of the patient were reviewed by me and considered in my medical decision making (see chart for details).    MDM Rules/Calculators/A&P                           Patient presents for worsening  left lower quadrant abdominal pain over the past 3 days.  She has additionally had diarrhea, nausea, and vomiting.  She is afebrile upon arrival.  On initial assessment, patient appears uncomfortable.  She has tachypnea which is likely attributable to the pain.  Lungs are clear to auscultation.  SPO2 is normal on room air.  Exam is notable for left lower quadrant tenderness.  She did receive Zofran prior to arrival.  She endorses continued nausea.  Lab work, obtained prior to being bedded in the ED, is notable for leukocytosis of 19.7.  Urinalysis shows bacteria but no pyuria.  She does have ketonuria consistent with decreased p.o. intake and  vomiting. IV fluids, Dilaudid, and Reglan were given.  CT scan of abdomen pelvis was ordered.  Results showed uncomplicated diverticulitis.  On reassessment, patient's pain and nausea were resolved.  She was able to tolerate p.o. intake.  Given her leukocytosis, patient was offered hospital admission for management of her diverticulitis.  Patient stated that she would prefer to undergo trial of outpatient therapy.  Augmentin was prescribed.  As needed Zofran was prescribed.  Patient was encouraged to return to the ED at any time if she does develop any worsening pain or persistent symptoms.  She was discharged in stable condition.   Final Clinical Impression(s) / ED Diagnoses Final diagnoses:  Diverticulitis    Rx / DC Orders ED Discharge Orders          Ordered    amoxicillin-clavulanate (AUGMENTIN) 875-125 MG tablet  Every 8 hours,   Status:  Discontinued        12/15/20 2135    ondansetron (ZOFRAN ODT) 4 MG disintegrating tablet  Every 8 hours PRN        12/15/20 2138    amoxicillin-clavulanate (AUGMENTIN) 875-125 MG tablet  2 times daily        12/15/20 2138             Godfrey Pick, MD 12/16/20 1326

## 2020-12-15 NOTE — ED Triage Notes (Signed)
Reports lower abdominal pain for the last two days.  Was at [facility] this morning but left due to the wait.

## 2020-12-15 NOTE — ED Notes (Signed)
ED Provider at bedside. 

## 2020-12-15 NOTE — ED Triage Notes (Signed)
Patient BIB [facility] from UC for evaluation of lower abdominal pain x2 days. Patient reports emesis and nausea, difficulty taking medications due to nausea and vomiting. Denies other complaints.

## 2020-12-15 NOTE — ED Notes (Signed)
IV placement attempted x2 unable to obtain IV placement.

## 2020-12-15 NOTE — ED Provider Notes (Addendum)
[place]    CSN: [id number] Arrival date & time: 12/15/20  1020      History   Chief Complaint Chief Complaint  Patient presents with   Abdominal Pain   Diarrhea    HPI Catherine Pope is a 55 y.o. female.   HPI  Abdominal Pain: Patient presents with her daughter.  Patient is in a great deal of distress.  She states that for the past few hours she has had significant abdominal pain.  Abdominal pain is located in her right lower quadrant to suprapubic area.  She has had diarrhea and vomiting.  She is not able to hold down any fluids or liquids.  She did check her sugar this morning which was 195.  She reports that she called EMS to take her to the hospital however she was told by them according to patient that it would take too long for her to wait in the emergency room and so they have referred her to urgent care.  According to patient she is postmenopausal.  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   per pt tested a month ago/ no results yet   Hypertension    Migraine    Neck pain    Seasonal allergies    Sinusitis     Patient Active Problem List   Diagnosis Date Noted   Right lumbar radiculopathy 06/03/2019   Diabetes ([place]) 10/15/2018   LGSIL on Pap smear of cervix 12/28/2017   Lumbosacral disc herniation 09/12/2017   Hoarseness 04/30/2017   Laryngopharyngeal reflux (LPR) 04/30/2017   Perennial allergic rhinitis 04/30/2017   Chronic pansinusitis 04/30/2017   Cervical vertebral fusion 12/27/2016   Essential hypertension 03/23/2016   Laryngitis, acute 03/23/2016   Neuropathy 03/23/2016   Tobacco dependence 03/23/2016   Morbid obesity ([place]) 11/26/2013   Migraine without aura 11/26/2013   Other and unspecified hyperlipidemia 11/26/2013   Low back pain 03/23/2013    Past Surgical History:   Procedure Laterality Date   ABDOMINAL HYSTERECTOMY     per pt , in her 35's   ANTERIOR CERVICAL DECOMP/DISCECTOMY FUSION N/A 12/27/2016   Procedure: CERVICAL SIX-SEVEN ANTERIOR CERVICAL DECOMPRESSION/DISCECTOMY FUSION;  Surgeon: Eustace Moore, MD;  Location: [place];  Service: Neurosurgery;  Laterality: N/A;   ANTERIOR CRUCIATE LIGAMENT REPAIR     right knee   BREAST EXCISIONAL BIOPSY Left over 20 years ago   benign   BREAST SURGERY     2 lumps removed at age 3, left breast/ benign   RADIOLOGY WITH ANESTHESIA Left 12/11/2017   Procedure: MRI cervical spine and left shoulder WITH ANESTHESIA;  Surgeon: Radiologist, Medication, MD;  Location: [place];  Service: Radiology;  Laterality: Left;   SHOULDER ARTHROSCOPY     right shoulder    OB History     Gravida  2   Para  2   Term  2   Preterm      AB      Living  2      SAB      IAB      Ectopic      Multiple      Live Births  2            Home Medications    Prior to  Admission medications   Medication Sig Start Date End Date Taking? Authorizing Provider  albuterol (PROVENTIL HFA;VENTOLIN HFA) 108 (90 Base) MCG/ACT inhaler Inhale 1-2 puffs into the lungs every 6 (six) hours as needed for wheezing or shortness of breath. 08/15/17   Wieters, Hallie C, PA-C  allopurinol (ZYLOPRIM) 100 MG tablet Take 100 mg by mouth daily. 07/31/18   [provider]  Cholecalciferol (VITAMIN D PO) Take 500 Units by mouth daily.     [provider]  Cyanocobalamin (VITAMIN B 12 PO) Take 1,000 mg by mouth daily.    [provider]  DULoxetine (CYMBALTA) 30 MG capsule Take 3 capsules (90 mg total) by mouth daily. 11/22/20 11/22/21  Arfeen, Arlyce Harman, MD  fluticasone (FLONASE) 50 MCG/ACT nasal spray Place 2 sprays into both nostrils as needed. 08/20/18   Azzie Glatter, FNP  gabapentin (NEURONTIN) 600 MG tablet Take 600 mg by mouth 3 (three) times daily.    [provider]  glucose blood (TRUE METRIX BLOOD  GLUCOSE TEST) test strip Use as instructed 03/23/16   Dorena Dew, FNP  hydrochlorothiazide (HYDRODIURIL) 25 MG tablet Take 1 tablet (25 mg total) by mouth daily. 08/20/18   Azzie Glatter, FNP  hydrOXYzine (ATARAX/VISTARIL) 25 MG tablet TAKE 1 TABLET (25 MG TOTAL) BY MOUTH 3 (THREE) TIMES DAILY. Patient not taking: Reported on 11/22/2020 08/18/20 08/18/21  Kathlee Nations, MD  Insulin Glargine (LANTUS SOLOSTAR) 100 UNIT/ML Solostar Pen Inject 155 Units into the skin every morning. And pen needles 1/day 10/15/18   Renato Shin, MD  Lancets [facility] ULTRASOFT) lancets Use as instructed 06/22/14   Ernestina Patches, MD  lisinopril (ZESTRIL) 20 MG tablet Take 1 tablet (20 mg total) by mouth daily. 08/20/18   Azzie Glatter, FNP  lovastatin (MEVACOR) 20 MG tablet TAKE 1 TABLET (20 MG TOTAL) BY MOUTH AT BEDTIME. 06/02/19   Azzie Glatter, FNP  Oxycodone HCl 10 MG TABS Take 10 mg by mouth in the morning, at noon, and at bedtime.    [provider]  Semaglutide, 1 MG/DOSE, (OZEMPIC, 1 MG/DOSE,) 2 MG/1.5ML SOPN Inject 1 mg into the skin once a week. 10/16/18   Renato Shin, MD  TIZANIDINE HCL PO Take 1 tablet by mouth in the morning, at noon, and at bedtime.    [provider]  topiramate (TOPAMAX) 50 MG tablet TAKE 2 TABLETS AT NIGHT AND 1 TABLET 12 HOURS AFTER DAILY. Patient not taking: Reported on 11/22/2020 09/06/18   Azzie Glatter, FNP  traZODone (DESYREL) 100 MG tablet Take 1 tablet (100 mg total) by mouth at bedtime as needed for sleep. 11/22/20 11/22/21  Arfeen, Arlyce Harman, MD    Family History Family History  Problem Relation Age of Onset   Hypertension Mother    Heart Problems Father    Diabetes Father    Hypertension Other    Heart disease Other    Stroke Sister    Kidney disease Brother     Social History Social History   Tobacco Use   Smoking status: Every Day    Packs/day: 0.50    Years: 15.00    Pack years: 7.50    Types: Cigarettes   Smokeless tobacco:  Never   Tobacco comments:    less than half pack a day.  Vaping Use   Vaping Use: Never used  Substance Use Topics   Alcohol use: No   Drug use: No     Allergies   Patient has no known  allergies.   Review of Systems Review of Systems  As stated above in HPI Physical Exam Triage Vital Signs ED Triage Vitals  Enc Vitals Group     BP 12/15/20 1027 (!) 221/108     Pulse Rate 12/15/20 1027 (!) 111     Resp 12/15/20 1027 (!) 28     Temp --      Temp src --      SpO2 12/15/20 1027 100 %     Weight --      Height --      Head Circumference --      Peak Flow --      Pain Score 12/15/20 1025 10     Pain Loc --      Pain Edu? --      Excl. in [place]? --    No data found.  Updated Vital Signs BP (!) 221/108 (BP Location: Right Arm)   Pulse (!) 111   Resp (!) 28   SpO2 100%    Physical Exam Vitals and nursing note reviewed.  Constitutional:      General: She is in acute distress.     Appearance: She is ill-appearing, toxic-appearing and diaphoretic.  HENT:     Head: Normocephalic and atraumatic.  Cardiovascular:     Rate and Rhythm: Regular rhythm. Tachycardia present.     Heart sounds: Normal heart sounds.  Pulmonary:     Effort: Pulmonary effort is normal.     Breath sounds: Normal breath sounds.  Abdominal:     General: Abdomen is flat. Bowel sounds are decreased. There is no distension.     Palpations: Abdomen is rigid.     Tenderness: There is abdominal tenderness in the right lower quadrant. There is guarding and rebound. Positive signs include McBurney's sign. Negative signs include Murphy's sign, psoas sign and obturator sign.     Hernia: No hernia is present.  Skin:    Comments: Dry  Neurological:     Mental Status: She is alert and oriented to person, place, and time.     UC Treatments / Results  Labs (all labs ordered are listed, but only abnormal results are displayed) Labs Reviewed - No data to display  EKG   Radiology No results  found.  Procedures Procedures (including critical care time)  Medications Ordered in UC Medications - No data to display  Initial Impression / Assessment and Plan / UC Course  I have reviewed the triage vital signs and the nursing notes.  Pertinent labs & imaging results that were available during my care of the patient were reviewed by me and considered in my medical decision making (see chart for details).     New.  Significant concern regarding her vital signs, history and examination findings.  She needs to be emergently evaluated in the emergency room. Forgoing UA at this time due to urgency for transport. Patient was assessed in triage and EMS was called for transportation while IV was placed.  Final Clinical Impressions(s) / UC Diagnoses   Final diagnoses:  None   Discharge Instructions   None    ED Prescriptions   None    PDMP not reviewed this encounter.   Hughie Closs, PA-C 12/15/20 1036    [address], [place] 12/15/20 1038

## 2020-12-15 NOTE — ED Notes (Signed)
Called to lobby to reassess patient.  Reports she feels worse.  Notably uncomfortable and tachypneic.  Reports no relief of nausea and pain is getting worse.

## 2020-12-15 NOTE — ED Notes (Signed)
Patient PO challenged.

## 2021-01-07 ENCOUNTER — Other Ambulatory Visit: Payer: Self-pay | Admitting: Nurse Practitioner

## 2021-01-07 DIAGNOSIS — Z1231 Encounter for screening mammogram for malignant neoplasm of breast: Secondary | ICD-10-CM

## 2021-01-15 ENCOUNTER — Ambulatory Visit
Admission: RE | Admit: 2021-01-15 | Discharge: 2021-01-15 | Disposition: A | Discharge status: Home / Self Care | Payer: Medicaid Other | Source: Ambulatory Visit | Attending: Nurse Practitioner | Admitting: Nurse Practitioner

## 2021-01-15 DIAGNOSIS — Z1231 Encounter for screening mammogram for malignant neoplasm of breast: Secondary | ICD-10-CM

## 2021-01-25 ENCOUNTER — Other Ambulatory Visit (HOSPITAL_COMMUNITY): Payer: Self-pay | Admitting: Gastroenterology

## 2021-01-25 ENCOUNTER — Other Ambulatory Visit: Payer: Self-pay | Admitting: Gastroenterology

## 2021-01-25 DIAGNOSIS — R112 Nausea with vomiting, unspecified: Secondary | ICD-10-CM

## 2021-02-21 ENCOUNTER — Telehealth (HOSPITAL_BASED_OUTPATIENT_CLINIC_OR_DEPARTMENT_OTHER): Payer: Medicaid Other | Admitting: Psychiatry

## 2021-02-21 ENCOUNTER — Encounter (HOSPITAL_COMMUNITY): Payer: Self-pay | Admitting: Psychiatry

## 2021-02-21 ENCOUNTER — Other Ambulatory Visit: Payer: Self-pay

## 2021-02-21 DIAGNOSIS — F331 Major depressive disorder, recurrent, moderate: Secondary | ICD-10-CM

## 2021-02-21 MED ORDER — DULOXETINE HCL 30 MG PO CPEP
90.0000 mg | ORAL_CAPSULE | Freq: Every day | ORAL | 2 refills | Status: DC
  Filled 2021-02-21: qty 90, 30d supply, fill #0

## 2021-02-21 MED ORDER — TRAZODONE HCL 100 MG PO TABS
100.0000 mg | ORAL_TABLET | Freq: Every evening | ORAL | 2 refills | Status: DC | PRN
  Filled 2021-02-21: qty 30, 30d supply, fill #0
  Filled 2021-04-13: qty 30, 30d supply, fill #1
  Filled 2021-04-14: qty 30, 30d supply, fill #0

## 2021-02-21 NOTE — Progress Notes (Signed)
Virtual Visit via Telephone Note  I connected with Catherine Pope on 02/21/21 at  9:00 AM EST by telephone and verified that I am speaking with the correct person using two identifiers.  Location: Patient: Home Provider: Home Office   I discussed the limitations, risks, security and privacy concerns of performing an evaluation and management service by telephone and the availability of in person appointments. I also discussed with the patient that there may be a patient responsible charge related to this service. The patient expressed understanding and agreed to proceed.   History of Present Illness: Patient is evaluated by phone session.  She endorsed some stress related to the court proceeding.  Patient has full custody but is still gets dealing with the other court cases.  She has a good support from her daughter and her mother.  She is on Cymbalta 90 mg daily and trazodone 100 mg.  She used to take trazodone as needed but not lately taking Ambien which helps her sleep.  Her grandkids are now 74, 87 and 7 years old.  Patient has a plan to cook food on Thanksgiving and then had a plan on Christmas to spend time with the family.  She is in therapy with Mr. Doren Custard at [facility].  Patient has no tremors, shakes or any EPS.  She denies any suicidal thoughts or any panic attacks.  Other than stress related to court proceedings she feels a current medicine is working well and she like to keep the current doses.  Past Psychiatric History:  No H/O inpatient or any suicidal attempt.  Seen at [facility] and given Seroquel, Effexor, BuSpar and Wellbutrin.  Effexor cause grogginess, Seroquel cause weight gain increased blood sugar.  Did not took Wellbutrin consistently. No H/O mania or psychosis.  Psychiatric Specialty Exam: Physical Exam  Review of Systems  Weight 195 lb (88.5 kg).There is no height or weight on file to calculate BMI.  General Appearance: NA  Eye Contact:  NA  Speech:  Clear and  Coherent  Volume:  Normal  Mood:  Anxious  Affect:  NA  Thought Process:  Goal Directed  Orientation:  Full (Time, Place, and Person)  Thought Content:  Rumination  Suicidal Thoughts:  No  Homicidal Thoughts:  No  Memory:  Immediate;   Good Recent;   Good Remote;   Good  Judgement:  Intact  Insight:  Present  Psychomotor Activity:  NA  Concentration:  Concentration: Good and Attention Span: Good  Recall:  Good  Fund of Knowledge:  Good  Language:  Good  Akathisia:  No  Handed:  Right  AIMS (if indicated):     Assets:  Communication Skills Desire for Improvement Housing Social Support Transportation  ADL's:  Intact  Cognition:  WNL  Sleep:   ok      Assessment and Plan: Major depressive disorder, recurrent.  Anxiety.  Patient's symptoms are manageable on current medication.  She is now taking trazodone 100 mg every night and like to get enough refills.  We will continue Cymbalta 90 mg daily and trazodone 100 mg at bedtime.  Encouraged to continue therapy with Ms. Dixon at [facility].  Recommended to call us back if she is any question or any concern.  Follow-up in 3 months.  Follow Up Instructions:    I discussed the assessment and treatment plan with the patient. The patient was provided an opportunity to ask questions and all were answered. The patient agreed with the plan and demonstrated an  understanding of the instructions.   The patient was advised to call back or seek an in-person evaluation if the symptoms worsen or if the condition fails to improve as anticipated.  I provided 19 minutes of non-face-to-face time during this encounter.   Kathlee Nations, MD

## 2021-02-22 ENCOUNTER — Other Ambulatory Visit: Payer: Self-pay

## 2021-03-11 ENCOUNTER — Ambulatory Visit (HOSPITAL_COMMUNITY)
Admission: RE | Admit: 2021-03-11 | Discharge: 2021-03-11 | Disposition: A | Discharge status: Home / Self Care | Payer: Medicaid Other | Source: Ambulatory Visit | Attending: Gastroenterology | Admitting: Gastroenterology

## 2021-03-11 ENCOUNTER — Other Ambulatory Visit: Payer: Self-pay

## 2021-03-11 ENCOUNTER — Encounter (HOSPITAL_COMMUNITY): Payer: Self-pay

## 2021-03-11 DIAGNOSIS — R112 Nausea with vomiting, unspecified: Secondary | ICD-10-CM | POA: Insufficient documentation

## 2021-03-14 ENCOUNTER — Ambulatory Visit (HOSPITAL_COMMUNITY)
Admission: RE | Admit: 2021-03-14 | Discharge: 2021-03-14 | Disposition: A | Discharge status: Home / Self Care | Payer: Medicaid Other | Source: Ambulatory Visit | Attending: Gastroenterology | Admitting: Gastroenterology

## 2021-03-14 ENCOUNTER — Other Ambulatory Visit: Payer: Self-pay

## 2021-03-14 DIAGNOSIS — R112 Nausea with vomiting, unspecified: Secondary | ICD-10-CM | POA: Insufficient documentation

## 2021-03-14 MED ORDER — TECHNETIUM TC 99M SULFUR COLLOID
2.0000 | Freq: Once | INTRAVENOUS | Status: AC | PRN
  Administered 2021-03-14: 2 via INTRAVENOUS

## 2021-04-14 ENCOUNTER — Other Ambulatory Visit: Payer: Self-pay

## 2021-04-19 ENCOUNTER — Other Ambulatory Visit: Payer: Self-pay

## 2021-05-18 ENCOUNTER — Other Ambulatory Visit: Payer: Self-pay

## 2021-05-18 ENCOUNTER — Telehealth (HOSPITAL_BASED_OUTPATIENT_CLINIC_OR_DEPARTMENT_OTHER): Payer: Medicaid Other | Admitting: Psychiatry

## 2021-05-18 ENCOUNTER — Encounter (HOSPITAL_COMMUNITY): Payer: Self-pay | Admitting: Psychiatry

## 2021-05-18 VITALS — Wt 195.0 lb

## 2021-05-18 DIAGNOSIS — F419 Anxiety disorder, unspecified: Secondary | ICD-10-CM | POA: Diagnosis not present

## 2021-05-18 DIAGNOSIS — F331 Major depressive disorder, recurrent, moderate: Secondary | ICD-10-CM | POA: Diagnosis not present

## 2021-05-18 MED ORDER — DULOXETINE HCL 30 MG PO CPEP
90.0000 mg | ORAL_CAPSULE | Freq: Every day | ORAL | 2 refills | Status: DC
Start: 2021-05-18 — End: 2022-10-14
  Filled 2021-05-18 – 2021-06-13 (×2): qty 90, 30d supply, fill #0

## 2021-05-18 MED ORDER — TRAZODONE HCL 100 MG PO TABS
100.0000 mg | ORAL_TABLET | Freq: Every evening | ORAL | 2 refills | Status: DC | PRN
  Filled 2021-05-18 – 2021-06-13 (×2): qty 30, 30d supply, fill #0

## 2021-05-18 NOTE — Progress Notes (Signed)
Virtual Visit via Telephone Note  I connected with Catherine Pope on 05/18/21 at  9:20 AM EST by telephone and verified that I am speaking with the correct person using two identifiers.  Location: Patient: Home Provider: Home Office   I discussed the limitations, risks, security and privacy concerns of performing an evaluation and management service by telephone and the availability of in person appointments. I also discussed with the patient that there may be a patient responsible charge related to this service. The patient expressed understanding and agreed to proceed.   History of Present Illness: Patient is evaluated by phone session.  She is taking her medication and doing better.  Her PCP started her on Wellbutrin to stop smoking and she realizes it is a short term.  She has cut down her smoking and hoping to stop and she can come out from Wellbutrin in few weeks.  She reported her anxiety and her stress level is less since majority of the court proceeding is over.  She still has 1 court date in March for remaining case.  She is now thinking to start part-time working.  She had lost work in 2015.  She denies any panic attack or any crying spells.  She is in therapy with Ms. Dixon at [facility].  She has no tremor or shakes or any EPS.  She is taking pain medicine from her physician.  She reported her diabetes under control.  She is seeing Simona Huh at [facility] and her last hemoglobin A1c was 6.6.  She has gastric paresis and taking Reglan.  Patient like to keep the current medication since it is helping her depression and anxiety.   Psychiatric Specialty Exam: Physical Exam  Review of Systems  Weight 195 lb (88.5 kg).There is no height or weight on file to calculate BMI.  General Appearance: NA  Eye Contact:  NA  Speech:  Slow  Volume:  Normal  Mood:  Euthymic  Affect:  NA  Thought Process:  Goal Directed  Orientation:  Full (Time, Place, and Person)   Thought Content:  Logical  Suicidal Thoughts:  No  Homicidal Thoughts:  No  Memory:  Immediate;   Good Recent;   Good Remote;   Good  Judgement:  Intact  Insight:  Present  Psychomotor Activity:  NA  Concentration:  Concentration: Good and Attention Span: Good  Recall:  Good  Fund of Knowledge:  Good  Language:  Good  Akathisia:  No  Handed:  Right  AIMS (if indicated):     Assets:  Communication Skills Desire for Improvement Housing Resilience Social Support Transportation  ADL's:  Intact  Cognition:  WNL  Sleep:   ok      Assessment and Plan: Major depressive disorder, recurrent.  Anxiety.  I review her current medication.  She is taking pain medicine, diabetes medication, acid reflux medicine and her last hemoglobin A1c was 6.6.  She is looking for a part-time job and I encourage since her stress level is better.  She has not worked since 2015.  Patient does not want to change the medication.  I encouraged to continue therapy with Ms[facility] at [facility].  Continue Cymbalta 90 mg daily, trazodone 100 mg at bedtime.  She is hoping to come off from Wellbutrin after she stopped smoking.  I recommended to call us back if is any question or any concern.  Follow-up in 3 months.  Follow Up Instructions:    I discussed the assessment  and treatment plan with the patient. The patient was provided an opportunity to ask questions and all were answered. The patient agreed with the plan and demonstrated an understanding of the instructions.   The patient was advised to call back or seek an in-person evaluation if the symptoms worsen or if the condition fails to improve as anticipated.  I provided 21 minutes of non-face-to-face time during this encounter.   Kathlee Nations, MD

## 2021-05-25 ENCOUNTER — Other Ambulatory Visit: Payer: Self-pay

## 2021-05-30 ENCOUNTER — Encounter: Payer: Self-pay | Admitting: Gastroenterology

## 2021-06-13 ENCOUNTER — Other Ambulatory Visit: Payer: Self-pay

## 2021-06-30 ENCOUNTER — Encounter: Payer: Medicaid Other | Attending: Gastroenterology | Admitting: Dietician

## 2021-06-30 ENCOUNTER — Encounter: Payer: Self-pay | Admitting: Dietician

## 2021-06-30 DIAGNOSIS — E1143 Type 2 diabetes mellitus with diabetic autonomic (poly)neuropathy: Secondary | ICD-10-CM | POA: Diagnosis not present

## 2021-06-30 DIAGNOSIS — K3184 Gastroparesis: Secondary | ICD-10-CM | POA: Diagnosis present

## 2021-06-30 DIAGNOSIS — Z794 Long term (current) use of insulin: Secondary | ICD-10-CM | POA: Insufficient documentation

## 2021-06-30 DIAGNOSIS — E1142 Type 2 diabetes mellitus with diabetic polyneuropathy: Secondary | ICD-10-CM | POA: Insufficient documentation

## 2021-06-30 NOTE — Progress Notes (Signed)
Medical Nutrition Therapy  ?Appointment Start time:  6948  Appointment End time:  1740 ? 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 ? ?NUTRITION ASSESSMENT  ? ?Anthropometrics  ?None taken ? ?Clinical ?Medical Hx: DM, Neuropathy, HLD, HTNm Rt ACL tear ?Medications: Reglan, Cymbalta, Ozempic, HCTZ, Lisinopril, Lovastatin, Tizanidine, Oxycodone ?Labs: A1c - 6.6% ?Notable Signs/Symptoms: N/V/D, GI pain ? ?Lifestyle & Dietary Hx ?Pt has long history of complications of N4OE, including neuropathy. ?Pt has recently been diagnosed with diabetic gastroparesis, started with severe bouts of emesis.  ?Pt started taking Reglan for the past 2 months, reports improvement in emesis. Nausea and diarrhea still present, as well as cramping or a knot in their stomach often. Pt reports this can last 2-4 days and can be excruciating. Pt reports that they will get bad gas before a flare-up. ?Pt reports that they are not checking their blood glucose due to it hurting there fingers.  ?RD provided sample meter and instruction on how to check glucose. CBG during visit 185 mg/dL. Pt ate an orange and drank a Sprite before visit. ?AccuChek GuideMe ?Lot #: [id number] ?Exp Date: 07/20/2022 ?S/N: [id number] ?Pt reports that they are eating 2 meals a day now, was previously only eating once a day. Pt reports getting very full off of small portions. Pt reports drinking mostly Sprite throughout the day. Pt dietary intake is fairly high in fat. Pt is edentulous and states that their dentures have not fit properly in years. Pt states that they get most of their calories from soda. ?Pt reports being a smoker, is trying to cut back. ? ? ? ?Estimated daily fluid intake: 64 oz ?Supplements: N/A ?Sleep: Poor if not taking meds ?Stress / self-care: Difficulty with GI pain ?Current average weekly physical activity:  ADLs ? ?24-Hr Dietary Recall ?First Meal: Bagel with cream cheese ?Snack: none ?Second Meal: Spaghetti with meat sauce, kielbasa ?Snack: none ?Third Meal: none ?Snack: none ?Beverages: Sprite, water ? ? ?NUTRITION DIAGNOSIS  ?[place]-1.4 Altered GI function As related to gastroparesis.  As evidenced by long history of T2DM, neuropathy, bout of severe emesis, and improvement in symptoms since taking Reglan . ? ? ?NUTRITION INTERVENTION  ?Nutrition education (E-1) on the following topics:  ?Educated patient on the pathophysiology of diabetes. This includes why our bodies need circulating blood sugar, the relationship between insulin and blood sugar, and the results of insulin resistance and/or pancreatic insufficiency on the development of diabetes. Educated patient on factors that contribute to elevation of blood sugars, such as stress, illness, injury,and food choices. Discussed the role that physical activity plays in lowering blood sugar.  ?Educated patient on the role of progressive neuropathy on gastroparesis. Educated patient on nutrients of concern for gastroparesis including high fat, high fiber foods, carbonated beverages, and  ? ?Handouts Provided Include  ?Gastroparesis Nutrition Therapy ? ?Learning Style & Readiness for Change ?Teaching method utilized: Visual & Auditory  ?Demonstrated degree of understanding via: Teach Back  ?Barriers to learning/adherence to lifestyle change: SES ? ?Goals Established by Pt ?Check your blood sugar in the morning before eating or drinking (fasting) as often as possible. ?Look for numbers under 125 mg/dL ?Your goal A1c is below 7.0% ?Always clean hands with warm, soapy water or an alcohol pad before checking. ?Prick on the side of the finger, near the tip beside your finger nail. If blood is slow to come out, massage finger in a downward motion  to increase amount of blood.  ?Switch from whole wheat bread to white bread until symptoms of gastroparesis subside. ?Choose soft  cooked foods and try to chew them as close to applesauce consistency as you can. ?Try to sip on an Ensure or similar protein supplement throughout the day. ?Increase your water intake in between meals and consider having a Gatorade each day to help stay hydrated. ?Take a short walk after your meals as often often as possible! ? ? ?MONITORING & EVALUATION ?Dietary intake, weekly physical activity, and GI symptoms in 6 weeks. ? ?Next Steps  ?Patient is to follow up with RD. ? ?

## 2021-06-30 NOTE — Patient Instructions (Addendum)
Check your blood sugar in the morning before eating or drinking (fasting) as often as possible. ?Look for numbers under 125 mg/dL ? ?Your goal A1c is below 7.0% ? ?Always clean hands with warm, soapy water or an alcohol pad before checking. ?Prick on the side of the finger, near the tip beside your finger nail. If blood is slow to come out, massage finger in a downward motion to increase amount of blood.  ? ?Switch from whole wheat bread to white bread until symptoms of gastroparesis subside. ? ?Choose soft cooked foods and try to chew them as close to applesauce consistency as you can. ? ?Try to sip on an Ensure or similar protein supplement throughout the day. ? ?Increase your water intake in between meals and consider having a Gatorade each day to help stay hydrated. ? ?Take a short walk after your meals as often often as possible! ? ? ?

## 2021-07-29 ENCOUNTER — Ambulatory Visit: Payer: Medicaid Other | Admitting: Registered"

## 2021-08-16 ENCOUNTER — Telehealth (HOSPITAL_COMMUNITY): Payer: Medicaid Other | Admitting: Psychiatry

## 2021-08-23 ENCOUNTER — Ambulatory Visit: Payer: Medicaid Other | Admitting: Dietician

## 2021-08-30 ENCOUNTER — Encounter: Payer: Medicaid Other | Attending: Gastroenterology | Admitting: Skilled Nursing Facility1

## 2021-08-30 DIAGNOSIS — Z794 Long term (current) use of insulin: Secondary | ICD-10-CM | POA: Insufficient documentation

## 2021-08-30 DIAGNOSIS — K3184 Gastroparesis: Secondary | ICD-10-CM | POA: Diagnosis present

## 2021-08-30 DIAGNOSIS — E1142 Type 2 diabetes mellitus with diabetic polyneuropathy: Secondary | ICD-10-CM | POA: Insufficient documentation

## 2021-08-30 NOTE — Progress Notes (Signed)
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 Pt has recently been diagnosed with diabetic gastroparesis, started with severe bouts of emesis.    Pt reports continued smoking stating she started Wellbutrin to stop stating ehr stop date is tomorrow. Pt states she is now back to throwing up stating the Reglan was helping but no longer.  Pt states she throws up daily, stomach bloating (not daily), hiccuping, no diarrhea,  Pt reports getting very full off of small portions. Pt states she is still eating 2 meals a day but did add 2 snacks a day.   Pt states her dentures do not fit at all and another fitting which did not help.    Pt states she is working on reducing her soda consumption but it has been tough.   Pt state she did use all of the strips from the meter given to her last time but forgot to ask her doctor for a prescription. Pt states when she was checking she was seeing about 117-96.   Tested pts sugar in appt due to feeling shaky and sweaty with a reading of 199 having eaten around 2 hours ago: twix cereal  Pt states she is a very consistent person so once she starts her changes she will keep with them forever she just has to start it. Pt state she is thinking she is having trouble accepting the issues she is having.   Pt states she has been working on not drinking before her meals and feels good about that change.   Pt states she notices meat all meats do not sit well.    Pt states she does talk to her daughter and does talk therapy every 2 weeks.   Estimated daily fluid intake: 64 oz Supplements: N/A Sleep: Poor if not taking meds Stress / self-care: Difficulty with GI pain Current average weekly physical activity: ADLs  24-Hr Dietary Recall First Meal: 1/2 Bagel with cream cheese Snack: none Second Meal 11:30: peanut butter crackers Snack 3pm: watermelon Third Meal 6:30: roast + mashed potatoes (butter) + green beans (whole meal thrown up) Snack:  Beverages: Sprite, water   NUTRITION DIAGNOSIS  [place]-1.4 Altered GI function As related to gastroparesis.  As evidenced by long history of T2DM, neuropathy, bout of severe emesis, and improvement in symptoms since taking Reglan .   NUTRITION INTERVENTION  Nutrition education (E-1) on the following topics:  Educated patient on the pathophysiology of diabetes. This includes why our bodies need circulating blood sugar, the relationship between insulin and blood sugar, and the results of insulin resistance and/or pancreatic insufficiency on the development of diabetes. Educated patient on factors that contribute to elevation of blood sugars, such as stress, illness, injury,and food choices. Discussed the role that physical activity plays in lowering blood sugar.  Educated patient on the role of progressive neuropathy on gastroparesis. Educated patient on nutrients of concern for gastroparesis including high fat, high fiber foods, carbonated beverages, and   Handouts Previously Provided Include  Gastroparesis Nutrition Therapy  Learning Style & Readiness for Change Teaching method utilized:  Visual & Auditory  Demonstrated degree of understanding via: Teach Back  Barriers to learning/adherence to lifestyle change: SES  Goals Established by Pt  Protein Option: -shredded chicken cooked in 2 tablespoons olive oil and low sodium chicken both simmering all day in crockpot -Moist, Moist, Moist really important  for all meats -add the protein powder to your yogurt -Canned tuna packed in oil if the packed in water does not go well -Try edamame in the frozen food aisle  -pay attention if peanut butter is an issue  -Eggs or you can try Just egg -try some beans -cheese  Carbohydrates: -only 1 slice of bread per meal -avoid crackers -beans are good (maybe) -any fruit -sweet or white potatoes (limit butter)  Veggies: -all cooked soft for now    Seasonings: -nothing too complex; simple is best and nothing too spicey    Eat half portions every 4 hours starting 1-1.5 hours after waking stopping 3 hours before bed Mush up foods before even putting it in your mouth to avoid chunks of food going in stomach   MONITORING & EVALUATION Dietary intake, weekly physical activity, and GI symptoms in 6 weeks.  Next Steps  Patient is to follow up with RD.

## 2021-10-12 ENCOUNTER — Encounter: Payer: Medicaid Other | Attending: Gastroenterology | Admitting: Skilled Nursing Facility1

## 2021-10-12 DIAGNOSIS — E1143 Type 2 diabetes mellitus with diabetic autonomic (poly)neuropathy: Secondary | ICD-10-CM | POA: Diagnosis not present

## 2021-10-12 DIAGNOSIS — K3184 Gastroparesis: Secondary | ICD-10-CM | POA: Insufficient documentation

## 2021-10-12 DIAGNOSIS — Z713 Dietary counseling and surveillance: Secondary | ICD-10-CM | POA: Diagnosis not present

## 2021-10-12 DIAGNOSIS — Z794 Long term (current) use of insulin: Secondary | ICD-10-CM

## 2021-10-12 DIAGNOSIS — E785 Hyperlipidemia, unspecified: Secondary | ICD-10-CM | POA: Diagnosis not present

## 2021-10-12 DIAGNOSIS — G629 Polyneuropathy, unspecified: Secondary | ICD-10-CM | POA: Diagnosis not present

## 2021-10-12 NOTE — Progress Notes (Signed)
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 Pt has recently been diagnosed with diabetic gastroparesis, started with severe bouts of emesis.    Pt reports continued smoking stating she started Wellbutrin to stop stating ehr stop date is tomorrow.  Pt state she did use all of the strips from the meter given to her last time but forgot to ask her doctor for a prescription. Pt states when she was checking she was seeing about 117-96.    Pt states she has been vomiting about once a week stating that is really good for her. Pt states she stays bloated and nauseous. Pt sates she met with her doctor today stating it went well stating she now does not have to go until 6 months instead of every other month which is really exciting.  Pt states she cannot recall what she eats regularly in a linear way.   Pt states she is trying to go to a more soft consistency diet which is going pretty good stating she boiling chicken still learning the timing to not make it rubbery, eating baked potatoes, potato salad, soft cooking her vegetables by boiling. Pt states she is not having as much diarrhea anymore.   Pt states she does skip meals pretty often usually breakfast and lunch. Pt states she forgets to eat.   Pt states she is tired all the time.   Pt states she is now down to 2 cigarettes per day.   Pt states she got a really  bad headache the other day stating she gets migraines about 2 a month. Pt states she does get some memory loss sometimes.    Due to pts poor diet, chronic vomiting, chronic diarrhea, fatigue, bright red tongue advised pt to ask her doctor for a b12, thiamine, and anemia panel.  Pt states she did get a vitamin D taken 3 months ago: dietitian does not have access to her doctors notes as they are not in EPIC  Estimated daily fluid intake: 64 oz Supplements: N/A Sleep: Poor if not taking meds Stress / self-care: Difficulty with GI pain Current average weekly physical activity: ADLs  24-Hr Dietary Recall: poor historian First Meal: skipped Snack: none Second Meal 11:30: mayo chicken salad + onion + pickles Snack 3pm: watermelon Third Meal: chicken salad Snack:  Beverages: Sprite, water, fruit juice   NUTRITION DIAGNOSIS  [place]-1.4 Altered GI function As related to gastroparesis.  As evidenced by long history of T2DM, neuropathy, bout of severe emesis, and improvement in symptoms since taking Reglan .   NUTRITION INTERVENTION  Nutrition education (E-1) on the following topics:  Educated patient on the pathophysiology of diabetes. This includes why our bodies need circulating blood sugar, the relationship between insulin and blood sugar, and the results of insulin resistance and/or pancreatic insufficiency on the development of diabetes. Educated patient on factors that contribute to elevation of blood sugars, such as stress, illness, injury,and food choices. Discussed the role that physical activity plays in  lowering blood sugar.  Educated patient on the role of progressive neuropathy on gastroparesis. Educated patient on nutrients of concern for gastroparesis including high fat, high fiber foods, carbonated beverages, and   Handouts Previously Provided Include  Gastroparesis Nutrition Therapy  Learning Style & Readiness for Change Teaching method utilized: Visual & Auditory  Demonstrated  degree of understanding via: Teach Back  Barriers to learning/adherence to lifestyle change: SES  Goals Established by Pt  -blended cottage cheese or protein shakes -get a multivitamin with iron -do not skip meals    MONITORING & EVALUATION Dietary intake, weekly physical activity, and GI symptoms in 2 months  Next Steps  Patient is to follow up with RD.

## 2022-01-02 ENCOUNTER — Ambulatory Visit: Payer: Medicaid Other | Admitting: Skilled Nursing Facility1

## 2022-03-07 ENCOUNTER — Other Ambulatory Visit: Payer: Self-pay | Admitting: Nurse Practitioner

## 2022-03-07 DIAGNOSIS — Z1231 Encounter for screening mammogram for malignant neoplasm of breast: Secondary | ICD-10-CM

## 2022-05-02 ENCOUNTER — Ambulatory Visit
Admission: RE | Admit: 2022-05-02 | Discharge: 2022-05-02 | Disposition: A | Discharge status: Home / Self Care | Payer: Medicaid Other | Source: Ambulatory Visit | Attending: Nurse Practitioner | Admitting: Nurse Practitioner

## 2022-05-02 DIAGNOSIS — Z1231 Encounter for screening mammogram for malignant neoplasm of breast: Secondary | ICD-10-CM

## 2022-10-14 ENCOUNTER — Other Ambulatory Visit: Payer: Self-pay

## 2022-10-14 ENCOUNTER — Observation Stay (HOSPITAL_COMMUNITY)
Admission: EM | Admit: 2022-10-14 | Discharge: 2022-10-15 | Disposition: A | Discharge status: Home / Self Care | Payer: MEDICAID | Attending: Student | Admitting: Student

## 2022-10-14 ENCOUNTER — Ambulatory Visit (HOSPITAL_COMMUNITY)
Admission: EM | Admit: 2022-10-14 | Discharge: 2022-10-14 | Disposition: A | Discharge status: Other Acute Inpatient Hospital | Payer: MEDICAID | Attending: Emergency Medicine | Admitting: Emergency Medicine

## 2022-10-14 ENCOUNTER — Emergency Department (HOSPITAL_COMMUNITY): Payer: MEDICAID

## 2022-10-14 ENCOUNTER — Encounter (HOSPITAL_COMMUNITY): Payer: Self-pay

## 2022-10-14 DIAGNOSIS — E669 Obesity, unspecified: Secondary | ICD-10-CM | POA: Insufficient documentation

## 2022-10-14 DIAGNOSIS — M545 Low back pain, unspecified: Secondary | ICD-10-CM | POA: Diagnosis present

## 2022-10-14 DIAGNOSIS — E785 Hyperlipidemia, unspecified: Secondary | ICD-10-CM | POA: Diagnosis present

## 2022-10-14 DIAGNOSIS — R112 Nausea with vomiting, unspecified: Secondary | ICD-10-CM | POA: Diagnosis present

## 2022-10-14 DIAGNOSIS — E1110 Type 2 diabetes mellitus with ketoacidosis without coma: Principal | ICD-10-CM | POA: Diagnosis present

## 2022-10-14 DIAGNOSIS — Z6831 Body mass index (BMI) 31.0-31.9, adult: Secondary | ICD-10-CM | POA: Insufficient documentation

## 2022-10-14 DIAGNOSIS — F17210 Nicotine dependence, cigarettes, uncomplicated: Secondary | ICD-10-CM | POA: Insufficient documentation

## 2022-10-14 DIAGNOSIS — Z79899 Other long term (current) drug therapy: Secondary | ICD-10-CM | POA: Diagnosis not present

## 2022-10-14 DIAGNOSIS — E1165 Type 2 diabetes mellitus with hyperglycemia: Secondary | ICD-10-CM | POA: Diagnosis not present

## 2022-10-14 DIAGNOSIS — R1084 Generalized abdominal pain: Secondary | ICD-10-CM

## 2022-10-14 DIAGNOSIS — R7989 Other specified abnormal findings of blood chemistry: Secondary | ICD-10-CM | POA: Diagnosis not present

## 2022-10-14 DIAGNOSIS — R739 Hyperglycemia, unspecified: Secondary | ICD-10-CM

## 2022-10-14 DIAGNOSIS — I10 Essential (primary) hypertension: Secondary | ICD-10-CM | POA: Diagnosis present

## 2022-10-14 DIAGNOSIS — F32A Depression, unspecified: Secondary | ICD-10-CM | POA: Diagnosis present

## 2022-10-14 LAB — CBC WITH DIFFERENTIAL/PLATELET
Abs Immature Granulocytes: 0 10*3/uL (ref 0.00–0.07)
Basophils Absolute: 0 10*3/uL (ref 0.0–0.1)
Basophils Relative: 0 %
Eosinophils Absolute: 0 10*3/uL (ref 0.0–0.5)
Eosinophils Relative: 0 %
HCT: 42.5 % (ref 36.0–46.0)
Hemoglobin: 14.4 g/dL (ref 12.0–15.0)
Lymphocytes Relative: 20 %
Lymphs Abs: 4.2 10*3/uL — ABNORMAL HIGH (ref 0.7–4.0)
MCH: 29 pg (ref 26.0–34.0)
MCHC: 33.9 g/dL (ref 30.0–36.0)
MCV: 85.7 fL (ref 80.0–100.0)
Monocytes Absolute: 1.2 10*3/uL — ABNORMAL HIGH (ref 0.1–1.0)
Monocytes Relative: 6 %
Neutro Abs: 15.4 10*3/uL — ABNORMAL HIGH (ref 1.7–7.7)
Neutrophils Relative %: 74 %
Platelets: 299 10*3/uL (ref 150–400)
RBC: 4.96 MIL/uL (ref 3.87–5.11)
RDW: 14.2 % (ref 11.5–15.5)
WBC: 20.8 10*3/uL — ABNORMAL HIGH (ref 4.0–10.5)
nRBC: 0 % (ref 0.0–0.2)
nRBC: 0 /100 WBC

## 2022-10-14 LAB — POCT URINALYSIS DIP (MANUAL ENTRY)
Glucose, UA: >=1000 mg/dL — AB
Leukocytes, UA: NEGATIVE
Nitrite, UA: NEGATIVE
Protein Ur, POC: >=300 mg/dL — AB
Spec Grav, UA: 1.025 (ref 1.010–1.025)
Urobilinogen, UA: 0.2 E.U./dL
pH, UA: 5.5 (ref 5.0–8.0)

## 2022-10-14 LAB — CBG MONITORING, ED
Glucose-Capillary: 475 mg/dL — ABNORMAL HIGH (ref 70–99)
Glucose-Capillary: 301 mg/dL — ABNORMAL HIGH (ref 70–99)
Glucose-Capillary: 268 mg/dL — ABNORMAL HIGH (ref 70–99)
Glucose-Capillary: 254 mg/dL — ABNORMAL HIGH (ref 70–99)
Glucose-Capillary: 233 mg/dL — ABNORMAL HIGH (ref 70–99)
Glucose-Capillary: 209 mg/dL — ABNORMAL HIGH (ref 70–99)
Glucose-Capillary: 268 mg/dL — ABNORMAL HIGH (ref 70–99)

## 2022-10-14 LAB — I-STAT VENOUS BLOOD GAS, ED
Acid-base deficit: 9 mmol/L — ABNORMAL HIGH (ref 0.0–2.0)
Bicarbonate: 13.7 mmol/L — ABNORMAL LOW (ref 20.0–28.0)
Calcium, Ion: 1.13 mmol/L — ABNORMAL LOW (ref 1.15–1.40)
HCT: 46 % (ref 36.0–46.0)
Hemoglobin: 15.6 g/dL — ABNORMAL HIGH (ref 12.0–15.0)
O2 Saturation: 99 %
Potassium: 4.4 mmol/L (ref 3.5–5.1)
Sodium: 132 mmol/L — ABNORMAL LOW (ref 135–145)
TCO2: 14 mmol/L — ABNORMAL LOW (ref 22–32)
pCO2, Ven: 21.9 mmHg — ABNORMAL LOW (ref 44–60)
pH, Ven: 7.404 (ref 7.25–7.43)
pO2, Ven: 158 mmHg — ABNORMAL HIGH (ref 32–45)

## 2022-10-14 LAB — BASIC METABOLIC PANEL
Anion gap: 20 — ABNORMAL HIGH (ref 5–15)
Anion gap: 13 (ref 5–15)
Anion gap: 14 (ref 5–15)
BUN: 12 mg/dL (ref 6–20)
BUN: 8 mg/dL (ref 6–20)
BUN: 9 mg/dL (ref 6–20)
CO2: 13 mmol/L — ABNORMAL LOW (ref 22–32)
CO2: 19 mmol/L — ABNORMAL LOW (ref 22–32)
CO2: 20 mmol/L — ABNORMAL LOW (ref 22–32)
Calcium: 9.5 mg/dL (ref 8.9–10.3)
Calcium: 8.9 mg/dL (ref 8.9–10.3)
Calcium: 9 mg/dL (ref 8.9–10.3)
Chloride: 98 mmol/L (ref 98–111)
Chloride: 100 mmol/L (ref 98–111)
Chloride: 99 mmol/L (ref 98–111)
Creatinine, Ser: 1.31 mg/dL — ABNORMAL HIGH (ref 0.44–1.00)
Creatinine, Ser: 0.92 mg/dL (ref 0.44–1.00)
Creatinine, Ser: 0.92 mg/dL (ref 0.44–1.00)
GFR, Estimated: 48 mL/min — ABNORMAL LOW (ref >60)
GFR, Estimated: >60 mL/min (ref >60)
GFR, Estimated: >60 mL/min (ref >60)
Glucose, Bld: 554 mg/dL (ref 70–99)
Glucose, Bld: 263 mg/dL — ABNORMAL HIGH (ref 70–99)
Glucose, Bld: 266 mg/dL — ABNORMAL HIGH (ref 70–99)
Potassium: 4.2 mmol/L (ref 3.5–5.1)
Potassium: 3.5 mmol/L (ref 3.5–5.1)
Potassium: 3.5 mmol/L (ref 3.5–5.1)
Sodium: 131 mmol/L — ABNORMAL LOW (ref 135–145)
Sodium: 132 mmol/L — ABNORMAL LOW (ref 135–145)
Sodium: 133 mmol/L — ABNORMAL LOW (ref 135–145)

## 2022-10-14 LAB — HIV ANTIBODY (ROUTINE TESTING W REFLEX): HIV Screen 4th Generation wRfx: NONREACTIVE

## 2022-10-14 LAB — BETA-HYDROXYBUTYRIC ACID
Beta-Hydroxybutyric Acid: 6.4 mmol/L — ABNORMAL HIGH (ref 0.05–0.27)
Beta-Hydroxybutyric Acid: 2.29 mmol/L — ABNORMAL HIGH (ref 0.05–0.27)

## 2022-10-14 LAB — HEMOGLOBIN A1C
Hgb A1c MFr Bld: 11.8 % — ABNORMAL HIGH (ref 4.8–5.6)
Mean Plasma Glucose: 291.96 mg/dL

## 2022-10-14 LAB — GLUCOSE, CAPILLARY
Glucose-Capillary: 215 mg/dL — ABNORMAL HIGH (ref 70–99)
Glucose-Capillary: 222 mg/dL — ABNORMAL HIGH (ref 70–99)
Glucose-Capillary: 233 mg/dL — ABNORMAL HIGH (ref 70–99)

## 2022-10-14 LAB — POCT FASTING CBG KUC MANUAL ENTRY: POCT Glucose (KUC): 519 mg/dL — AB (ref 70–99)

## 2022-10-14 MED ORDER — ONDANSETRON HCL 4 MG/2ML IJ SOLN
4.0000 mg | Freq: Four times a day (QID) | INTRAMUSCULAR | Status: DC | PRN
  Administered 2022-10-14 – 2022-10-15 (×2): 4 mg via INTRAVENOUS
  Filled 2022-10-14 (×2): qty 2

## 2022-10-14 MED ORDER — GUAIFENESIN ER 600 MG PO TB12
600.0000 mg | ORAL_TABLET | Freq: Every day | ORAL | Status: DC
  Administered 2022-10-14: 600 mg via ORAL
  Filled 2022-10-14: qty 1

## 2022-10-14 MED ORDER — LORATADINE 10 MG PO TABS
10.0000 mg | ORAL_TABLET | Freq: Every day | ORAL | Status: DC
  Administered 2022-10-14 – 2022-10-15 (×2): 10 mg via ORAL
  Filled 2022-10-14 (×2): qty 1

## 2022-10-14 MED ORDER — IOHEXOL 350 MG/ML SOLN
75.0000 mL | Freq: Once | INTRAVENOUS | Status: AC | PRN
  Administered 2022-10-14: 75 mL via INTRAVENOUS

## 2022-10-14 MED ORDER — DEXTROSE IN LACTATED RINGERS 5 % IV SOLN: INTRAVENOUS | Status: DC

## 2022-10-14 MED ORDER — OXYCODONE HCL 5 MG PO TABS
10.0000 mg | ORAL_TABLET | Freq: Three times a day (TID) | ORAL | Status: DC
  Administered 2022-10-14 – 2022-10-15 (×4): 10 mg via ORAL
  Filled 2022-10-14 (×4): qty 2

## 2022-10-14 MED ORDER — LACTATED RINGERS IV BOLUS
20.0000 mL/kg | Freq: Once | INTRAVENOUS | Status: AC
  Administered 2022-10-14: 1796 mL via INTRAVENOUS

## 2022-10-14 MED ORDER — KETOROLAC TROMETHAMINE 30 MG/ML IJ SOLN
30.0000 mg | Freq: Once | INTRAMUSCULAR | Status: AC
  Administered 2022-10-14: 30 mg via INTRAMUSCULAR

## 2022-10-14 MED ORDER — ONDANSETRON HCL 4 MG/2ML IJ SOLN
INTRAMUSCULAR | Status: AC
  Filled 2022-10-14: qty 2

## 2022-10-14 MED ORDER — BUPROPION HCL ER (XL) 150 MG PO TB24
300.0000 mg | ORAL_TABLET | Freq: Every day | ORAL | Status: DC
  Administered 2022-10-14 – 2022-10-15 (×2): 300 mg via ORAL
  Filled 2022-10-14 (×2): qty 2

## 2022-10-14 MED ORDER — LORAZEPAM 2 MG/ML IJ SOLN
0.5000 mg | Freq: Once | INTRAMUSCULAR | Status: AC | PRN
  Administered 2022-10-14: 0.5 mg via INTRAVENOUS
  Filled 2022-10-14: qty 1

## 2022-10-14 MED ORDER — PRAVASTATIN SODIUM 40 MG PO TABS
20.0000 mg | ORAL_TABLET | Freq: Every day | ORAL | Status: DC
  Administered 2022-10-14: 20 mg via ORAL
  Filled 2022-10-14: qty 2

## 2022-10-14 MED ORDER — DEXTROSE 50 % IV SOLN: 0.0000 mL | INTRAVENOUS | Status: DC | PRN

## 2022-10-14 MED ORDER — ACETAMINOPHEN 325 MG PO TABS: 650.0000 mg | ORAL_TABLET | Freq: Four times a day (QID) | ORAL | Status: DC | PRN

## 2022-10-14 MED ORDER — ONDANSETRON HCL 4 MG/2ML IJ SOLN
4.0000 mg | Freq: Once | INTRAMUSCULAR | Status: AC
  Administered 2022-10-14: 4 mg via INTRAVENOUS
  Filled 2022-10-14: qty 2

## 2022-10-14 MED ORDER — INSULIN REGULAR(HUMAN) IN NACL 100-0.9 UT/100ML-% IV SOLN
INTRAVENOUS | Status: DC
  Administered 2022-10-14: 9.6 [IU]/h via INTRAVENOUS
  Administered 2022-10-14: 5 [IU]/h via INTRAVENOUS
  Administered 2022-10-15: 3 [IU]/h via INTRAVENOUS
  Filled 2022-10-14 (×2): qty 100

## 2022-10-14 MED ORDER — LACTATED RINGERS IV BOLUS
1000.0000 mL | INTRAVENOUS | Status: AC
  Administered 2022-10-14: 1000 mL via INTRAVENOUS

## 2022-10-14 MED ORDER — FENTANYL CITRATE PF 50 MCG/ML IJ SOSY
50.0000 ug | PREFILLED_SYRINGE | Freq: Once | INTRAMUSCULAR | Status: AC
  Administered 2022-10-14: 50 ug via INTRAVENOUS
  Filled 2022-10-14: qty 1

## 2022-10-14 MED ORDER — ONDANSETRON HCL 4 MG/2ML IJ SOLN
4.0000 mg | Freq: Once | INTRAMUSCULAR | Status: AC
  Administered 2022-10-14: 4 mg via INTRAMUSCULAR

## 2022-10-14 MED ORDER — LACTATED RINGERS IV SOLN: INTRAVENOUS | Status: DC

## 2022-10-14 MED ORDER — KETOROLAC TROMETHAMINE 30 MG/ML IJ SOLN
INTRAMUSCULAR | Status: AC
  Filled 2022-10-14: qty 1

## 2022-10-14 MED ORDER — TRAZODONE HCL 100 MG PO TABS: 100.0000 mg | ORAL_TABLET | Freq: Every evening | ORAL | Status: DC | PRN

## 2022-10-14 MED ORDER — ENOXAPARIN SODIUM 40 MG/0.4ML IJ SOSY
40.0000 mg | PREFILLED_SYRINGE | INTRAMUSCULAR | Status: DC
  Administered 2022-10-14: 40 mg via SUBCUTANEOUS
  Filled 2022-10-14: qty 0.4

## 2022-10-14 MED ORDER — POTASSIUM CHLORIDE 10 MEQ/100ML IV SOLN
10.0000 meq | INTRAVENOUS | Status: AC
  Administered 2022-10-14 (×2): 10 meq via INTRAVENOUS
  Filled 2022-10-14 (×2): qty 100

## 2022-10-14 MED ORDER — TIZANIDINE HCL 4 MG PO TABS
4.0000 mg | ORAL_TABLET | Freq: Three times a day (TID) | ORAL | Status: DC
  Administered 2022-10-14 – 2022-10-15 (×4): 4 mg via ORAL
  Filled 2022-10-14 (×4): qty 1

## 2022-10-14 NOTE — Progress Notes (Signed)
TRH night cross cover note:   I was notified by RN that the patient requests resumption of her home scheduled evening Mucinex.  I subsequently resumed her home scheduled evening Mucinex.    Newton Pigg, DO Hospitalist

## 2022-10-14 NOTE — ED Notes (Signed)
ED TO INPATIENT HANDOFF REPORT  ED Nurse Name and Phone #: Morrie Sheldon [phone]  S Name/Age/Gender Catherine Pope 57 y.o. female Room/Bed: 005C/005C  Code Status   Code Status: Full Code  Home/SNF/Other Home Patient oriented to: self, place, time, and situation Is this baseline? Yes   Triage Complete: Triage complete  Chief Complaint DKA (diabetic ketoacidosis) (HCC) [E11.10]  Triage Note Pt present to ED from urgent care with c/o abdominal pain, cramping in nature, n/v/d times three days. Pt blood sugar noted greater than 500 at urgent care. Ketones noted in urine per urgent care. Pt A&Ox4 at this time. Pt continues to c/o abdominal pain.   Allergies No Known Allergies  Level of Care/Admitting Diagnosis ED Disposition     ED Disposition  Admit   Condition  --   Comment  Hospital Area: [facility] [[id number]]  Level of Care: Progressive [102]  Admit to Progressive based on following criteria: GI, ENDOCRINE disease patients with GI bleeding, acute liver failure or pancreatitis, stable with diabetic ketoacidosis or thyrotoxicosis (hypothyroid) state.  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 OR;  Service: Neurosurgery;  Laterality: N/A;   ANTERIOR CRUCIATE LIGAMENT REPAIR     right knee   BREAST EXCISIONAL BIOPSY Left over 20 years ago   benign   BREAST SURGERY     2 lumps removed at age 31, left breast/ benign   RADIOLOGY WITH ANESTHESIA Left 12/11/2017   Procedure: MRI cervical spine and left shoulder WITH ANESTHESIA;  Surgeon: Radiologist, Medication, MD;  Location: [facility] OR;  Service: Radiology;  Laterality: Left;   SHOULDER ARTHROSCOPY     right shoulder     A IV Location/Drains/Wounds Patient Lines/Drains/Airways Status     Active Line/Drains/Airways     Name Placement date Placement time Site Days   Peripheral IV 10/14/22 20 G Left;Posterior Forearm 10/14/22  1152  Forearm  less than 1   Peripheral IV 10/14/22 22 G 1" Posterior;Right Hand 10/14/22  1432  Hand  less than 1            Intake/Output Last 24 hours  Intake/Output Summary (Last 24 hours) at 10/14/2022 2004 Last data filed at 10/14/2022 1942 Gross per 24 hour  Intake 29.96 ml  Output --  Net 29.96 ml    Labs/Imaging Results for orders placed or performed during the hospital encounter of 10/14/22 (from the past 48 hour(s))  CBG monitoring,  ED     Status: Abnormal   Collection Time: 10/14/22 12:35 PM  Result Value Ref Range   Glucose-Capillary 475 (H) 70 - 99 mg/dL    Comment: Glucose reference range applies only to samples taken after fasting for at least 8 hours.   Comment 1 Document in Chart   Basic metabolic panel     Status: Abnormal   Collection Time: 10/14/22 12:35 PM  Result Value Ref Range   Sodium 131 (L) 135 - 145 mmol/L   Potassium 4.2 3.5 - 5.1 mmol/L   Chloride 98 98 - 111 mmol/L   CO2 13 (L) 22 - 32 mmol/L   Glucose, Bld 554 (HH) 70 - 99 mg/dL    Comment: CRITICAL RESULT CALLED TO, READ BACK BY  AND VERIFIED WITH C BLANKS RN AT [id number] BY D LONG Glucose reference range applies only to samples taken after fasting for at least 8 hours.    BUN 12 6 - 20 mg/dL   Creatinine, Ser 9.14 (H) 0.44 - 1.00 mg/dL   Calcium 9.5 8.9 - 78.2 mg/dL   GFR, Estimated 48 (L) >60 mL/min    Comment: (NOTE) Calculated using the CKD-EPI Creatinine Equation (2021)    Anion gap 20 (H) 5 - 15    Comment: Performed at [facility], [address] [place], [place] [address]  Beta-hydroxybutyric acid     Status: Abnormal   Collection Time: 10/14/22 12:35 PM  Result Value Ref Range   Beta-Hydroxybutyric Acid 6.40 (H) 0.05 - 0.27 mmol/L    Comment: RESULT CONFIRMED BY MANUAL DILUTION Performed at [facility], [address], [place], [place] [address]   CBC with Differential (PNL)     Status: Abnormal   Collection Time: 10/14/22 12:35 PM  Result Value Ref Range   WBC 20.8 (H) 4.0 - 10.5 K/uL   RBC 4.96 3.87 - 5.11 MIL/uL   Hemoglobin 14.4 12.0 - 15.0 g/dL   HCT 78.4 69.6 - 29.5 %   MCV 85.7 80.0 - 100.0 fL   MCH 29.0 26.0 - 34.0 pg   MCHC 33.9 30.0 - 36.0 g/dL   RDW 28.4 13.2 - 44.0 %   Platelets 299 150 - 400 K/uL   nRBC 0.0 0.0 - 0.2 %   Neutrophils Relative % 74 %   Neutro Abs 15.4 (H) 1.7 - 7.7 K/uL   Lymphocytes Relative 20 %   Lymphs Abs 4.2 (H) 0.7 - 4.0 K/uL   Monocytes Relative 6 %   Monocytes Absolute 1.2 (H) 0.1 - 1.0 K/uL   Eosinophils Relative 0 %   Eosinophils Absolute 0.0 0.0 - 0.5 K/uL   Basophils Relative 0 %   Basophils Absolute 0.0 0.0 - 0.1 K/uL   nRBC 0 0 /100 WBC   Abs Immature Granulocytes 0.00 0.00 - 0.07 K/uL    Comment: Performed at [facility], [address], [place], [place] [address]  I-Stat Venous Blood Gas, ED     Status: Abnormal   Collection Time: 10/14/22  1:03 PM  Result Value Ref Range   pH, Ven 7.404 7.25 - 7.43   pCO2, Ven 21.9 (L) 44 - 60 mmHg   pO2, Ven 158 (H) 32 - 45 mmHg   Bicarbonate 13.7 (L) 20.0 - 28.0 mmol/L   TCO2  14 (L) 22 - 32 mmol/L   O2 Saturation 99 %   Acid-base deficit 9.0 (H) 0.0 - 2.0 mmol/L   Sodium 132 (L) 135 -  145 mmol/L   Potassium 4.4 3.5 - 5.1 mmol/L   Calcium, Ion 1.13 (L) 1.15 - 1.40 mmol/L   HCT 46.0 36.0 - 46.0 %   Hemoglobin 15.6 (H) 12.0 - 15.0 g/dL   Sample type VENOUS   CBG monitoring, ED     Status: Abnormal   Collection Time: 10/14/22  3:16 PM  Result Value Ref Range   Glucose-Capillary 301 (H) 70 - 99 mg/dL    Comment: Glucose reference range applies only to samples taken after fasting for at least 8 hours.   Comment 1 Document in Chart   CBG monitoring, ED     Status: Abnormal   Collection Time: 10/14/22  4:27 PM  Result Value Ref Range   Glucose-Capillary 268 (H) 70 - 99 mg/dL    Comment: Glucose reference range applies only to samples taken after fasting for at least 8 hours.   Comment 1 Document in Chart   CBG monitoring, ED     Status: Abnormal   Collection Time: 10/14/22  5:31 PM  Result Value Ref Range   Glucose-Capillary 254 (H) 70 - 99 mg/dL    Comment: Glucose reference range applies only to samples taken after fasting for at least 8 hours.   Comment 1 Document in Chart   CBG monitoring, ED     Status: Abnormal   Collection Time: 10/14/22  6:36 PM  Result Value Ref Range   Glucose-Capillary 233 (H) 70 - 99 mg/dL    Comment: Glucose reference range applies only to samples taken after fasting for at least 8 hours.   Comment 1 Document in Chart   Basic metabolic panel     Status: Abnormal   Collection Time: 10/14/22  7:23 PM  Result Value Ref Range   Sodium 132 (L) 135 - 145 mmol/L   Potassium 3.5 3.5 - 5.1 mmol/L   Chloride 99 98 - 111 mmol/L   CO2 19 (L) 22 - 32 mmol/L   Glucose, Bld 266 (H) 70 - 99 mg/dL    Comment: Glucose reference range applies only to samples taken after fasting for at least 8 hours.   BUN 9 6 - 20 mg/dL   Creatinine, Ser 4.03 0.44 - 1.00 mg/dL   Calcium 8.9 8.9 - 47.4 mg/dL   GFR, Estimated >25 >95 mL/min    Comment:  (NOTE) Calculated using the CKD-EPI Creatinine Equation (2021)    Anion gap 14 5 - 15    Comment: Performed at [facility], [address], [place], [place] [address]  Basic metabolic panel     Status: Abnormal   Collection Time: 10/14/22  7:23 PM  Result Value Ref Range   Sodium 133 (L) 135 - 145 mmol/L   Potassium 3.5 3.5 - 5.1 mmol/L   Chloride 100 98 - 111 mmol/L   CO2 20 (L) 22 - 32 mmol/L   Glucose, Bld 263 (H) 70 - 99 mg/dL    Comment: Glucose reference range applies only to samples taken after fasting for at least 8 hours.   BUN 8 6 - 20 mg/dL   Creatinine, Ser 6.43 0.44 - 1.00 mg/dL   Calcium 9.0 8.9 - 32.9 mg/dL   GFR, Estimated >51 >88 mL/min    Comment: (NOTE) Calculated using the CKD-EPI Creatinine Equation (2021)    Anion gap 13 5 - 15    Comment: Performed at [facility], [address], [place], [place] [address]  Hemoglobin A1c     Status:  Abnormal   Collection Time: 10/14/22  7:23 PM  Result Value Ref Range   Hgb A1c MFr Bld 11.8 (H) 4.8 - 5.6 %    Comment: (NOTE) Pre diabetes:          5.7%-6.4%  Diabetes:              >6.4%  Glycemic control for   <7.0% adults with diabetes    Mean Plasma Glucose 291.96 mg/dL    Comment: Performed at [facility], [address], [place], [place] [address]  CBG monitoring, ED     Status: Abnormal   Collection Time: 10/14/22  7:37 PM  Result Value Ref Range   Glucose-Capillary 268 (H) 70 - 99 mg/dL    Comment: Glucose reference range applies only to samples taken after fasting for at least 8 hours.   CT ABDOMEN PELVIS W CONTRAST  Result Date: 10/14/2022 CLINICAL DATA:  Right lower quadrant abdominal pain. EXAM: CT ABDOMEN AND PELVIS WITH CONTRAST TECHNIQUE: Multidetector CT imaging of the abdomen and pelvis was performed using the standard protocol following bolus administration of intravenous contrast. RADIATION DOSE REDUCTION: This exam was performed according to the departmental dose-optimization  program which includes automated exposure control, adjustment of the mA and/or kV according to patient size and/or use of iterative reconstruction technique. CONTRAST:  75mL OMNIPAQUE IOHEXOL 350 MG/ML SOLN COMPARISON:  December 15, 2020 FINDINGS: Lower chest: Left lower lobe solitary cyst. Hepatobiliary: Hepatic steatosis. Normal appearance of the gallbladder. Pancreas: Unremarkable. No pancreatic ductal dilatation or surrounding inflammatory changes. Spleen: Normal in size without focal abnormality. Adrenals/Urinary Tract: Adrenal glands are unremarkable. Kidneys are normal, without renal calculi, focal lesion, or hydronephrosis. Bladder is unremarkable. Stomach/Bowel: Stomach is within normal limits. Appendix appears normal. No evidence of bowel wall thickening, distention, or inflammatory changes. Mild left colonic diverticulosis. Vascular/Lymphatic: Aortic atherosclerosis. No enlarged abdominal or pelvic lymph nodes. Reproductive: Status post hysterectomy. No adnexal masses. Other: No abdominal wall hernia or abnormality. No abdominopelvic ascites. Musculoskeletal: No acute or significant osseous findings. IMPRESSION: 1. No acute abnormality identified within the abdomen or pelvis. 2. Hepatic steatosis. 3. Mild left colonic diverticulosis without evidence of diverticulitis. 4. Aortic atherosclerosis. Aortic Atherosclerosis (ICD10-I70.0). Electronically Signed   By: Ted Mcalpine M.D.   On: 10/14/2022 14:53    Pending Labs Unresulted Labs (From admission, onward)     Start     Ordered   10/14/22 1556  HIV Antibody (routine testing w rflx)  (HIV Antibody (Routine testing w reflex) panel)  Once,   R        10/14/22 1558   10/14/22 1556  Basic metabolic panel  (Diabetes Ketoacidosis (DKA))  STAT Now then every 4 hours ,   R (with STAT occurrences)      10/14/22 1558   10/14/22 1556  Beta-hydroxybutyric acid  (Diabetes Ketoacidosis (DKA))  Now then every 8 hours,   R (with TIMED occurrences)       10/14/22 1558            Vitals/Pain Today's Vitals   10/14/22 1730 10/14/22 1800 10/14/22 1830 10/14/22 1920  BP: (!) 161/73 (!) 150/74 (!) 147/67 (!) 146/82  Pulse: (!) 121 (!) 122 (!) 113 (!) 116  Resp: (!) 21 (!) 22  20  Temp:      TempSrc:      SpO2: 99% 99% 94% 99%  Weight:      Height:      PainSc:  Isolation Precautions No active isolations  Medications Medications  insulin regular, human (MYXREDLIN) 100 units/ 100 mL infusion (5 Units/hr Intravenous Infusion Verify 10/14/22 1942)  oxyCODONE (Oxy IR/ROXICODONE) immediate release tablet 10 mg (10 mg Oral Given 10/14/22 1630)  pravastatin (PRAVACHOL) tablet 20 mg (20 mg Oral Given 10/14/22 1856)  buPROPion (WELLBUTRIN XL) 24 hr tablet 300 mg (300 mg Oral Given 10/14/22 1855)  traZODone (DESYREL) tablet 100 mg (has no administration in time range)  tiZANidine (ZANAFLEX) tablet 4 mg (4 mg Oral Given 10/14/22 1629)  loratadine (CLARITIN) tablet 10 mg (10 mg Oral Given 10/14/22 1629)  enoxaparin (LOVENOX) injection 40 mg (40 mg Subcutaneous Given 10/14/22 1630)  lactated ringers infusion (has no administration in time range)  dextrose 5 % in lactated ringers infusion ( Intravenous New Bag/Given 10/14/22 1843)  dextrose 50 % solution 0-50 mL (has no administration in time range)  lactated ringers bolus 1,000 mL (1,000 mLs Intravenous Bolus 10/14/22 1637)  acetaminophen (TYLENOL) tablet 650 mg (has no administration in time range)  ondansetron (ZOFRAN) injection 4 mg (has no administration in time range)  lactated ringers bolus 1,796 mL (1,796 mLs Intravenous Bolus 10/14/22 1242)  fentaNYL (SUBLIMAZE) injection 50 mcg (50 mcg Intravenous Given 10/14/22 1312)  ondansetron (ZOFRAN) injection 4 mg (4 mg Intravenous Given 10/14/22 1311)  potassium chloride 10 mEq in 100 mL IVPB (0 mEq Intravenous Stopped 10/14/22 1734)  LORazepam (ATIVAN) injection 0.5 mg (0.5 mg Intravenous Given 10/14/22 1401)  iohexol (OMNIPAQUE) 350 MG/ML  injection 75 mL (75 mLs Intravenous Contrast Given 10/14/22 1418)    Mobility walks     Focused Assessments Pulmonary Assessment Handoff:  Lung sounds:   O2 Device: Room Air      R Recommendations: See Admitting Provider Note  Report given to:   Additional Notes:

## 2022-10-14 NOTE — ED Provider Notes (Signed)
[place] EMERGENCY DEPARTMENT AT [facility] Provider Note  CSN: [id number] Arrival date & time: 10/14/22 1214  Chief Complaint(s) Hyperglycemia  HPI Catherine Pope is a 57 y.o. female with PMH HTN, T2DM, obesity who presents emergency room for evaluation of abdominal pain nausea vomiting diarrhea.  Patient was seen earlier at urgent care where she was found to be severely hypoglycemic with ketones in the urine and was transferred to the emergency department for evaluation.  Here in the ER, she is endorsing right lower quadrant and suprapubic abdominal pain with nausea.   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 Authorizing Provider  buPROPion (WELLBUTRIN XL) 300 MG 24 hr tablet Take 300 mg by mouth daily. 04/05/21  Yes McCoy, Fleet Contras, NP  cetirizine (ZYRTEC) 10 MG tablet  Take 10 mg by mouth at bedtime. 04/20/22  Yes [provider]  Galcanezumab-gnlm [facility]) 120 MG/ML SOAJ  01/05/20  Yes Courtney Paris, NP  hydrochlorothiazide (HYDRODIURIL) 25 MG tablet Take 1 tablet (25 mg total) by mouth daily. 08/20/18  Yes Kallie Locks, FNP  lisinopril (ZESTRIL) 20 MG tablet Take 1 tablet (20 mg total) by mouth daily. 08/20/18  Yes Kallie Locks, FNP  lovastatin (MEVACOR) 20 MG tablet TAKE 1 TABLET (20 MG TOTAL) BY MOUTH AT BEDTIME. 06/02/19  Yes Kallie Locks, FNP  metoCLOPramide (REGLAN) 5 MG tablet Take by mouth.   Yes McCoy, Fleet Contras, NP  ondansetron (ZOFRAN ODT) 4 MG disintegrating tablet Take 1 tablet (4 mg total) by mouth every 8 (eight) hours as needed for up to 12 doses for nausea or vomiting. 12/15/20  Yes Gloris Manchester, MD  Oxycodone HCl 10 MG TABS Take 10 mg by mouth in the morning, at noon, and at bedtime.   Yes [provider]  tirzepatide Greggory Keen) 15 MG/0.5ML Pen Inject 15 mg into the skin once a week. Injection on Fridays   Yes [provider]  TIZANIDINE HCL PO Take 1 tablet by mouth in the morning, at noon, and at bedtime.   Yes [provider]  traZODone (DESYREL) 100 MG tablet Take 1 tablet (100 mg total) by mouth at bedtime as needed for sleep. 05/18/21 10/14/22 Yes Arfeen, Phillips Grout, MD  Lancets [facility] ULTRASOFT) lancets Use as instructed 06/22/14   Toy Cookey, MD  Past Surgical History Past Surgical History:  Procedure Laterality Date   ABDOMINAL HYSTERECTOMY     per pt , in her 89's   ANTERIOR CERVICAL DECOMP/DISCECTOMY FUSION N/A 12/27/2016   Procedure: CERVICAL SIX-SEVEN ANTERIOR CERVICAL DECOMPRESSION/DISCECTOMY FUSION;  Surgeon: Tia Alert, MD;  Location: [facility] OR;  Service: Neurosurgery;  Laterality: N/A;   ANTERIOR CRUCIATE LIGAMENT REPAIR     right knee   BREAST EXCISIONAL  BIOPSY Left over 20 years ago   benign   BREAST SURGERY     2 lumps removed at age 58, left breast/ benign   RADIOLOGY WITH ANESTHESIA Left 12/11/2017   Procedure: MRI cervical spine and left shoulder WITH ANESTHESIA;  Surgeon: Radiologist, Medication, MD;  Location: [facility] OR;  Service: Radiology;  Laterality: Left;   SHOULDER ARTHROSCOPY     right shoulder   Family History Family History  Problem Relation Age of Onset   Hypertension Mother    Heart Problems Father    Diabetes Father    Hypertension Other    Heart disease Other    Stroke Sister    Kidney disease Brother     Social History Social History   Tobacco Use   Smoking status: Every Day    Current packs/day: 0.50    Average packs/day: 0.5 packs/day for 15.0 years (7.5 ttl pk-yrs)    Types: Cigarettes   Smokeless tobacco: Never   Tobacco comments:    less than half pack a day.  Vaping Use   Vaping status: Never Used  Substance Use Topics   Alcohol use: No   Drug use: No   Allergies Patient has no known allergies.  Review of Systems Review of Systems  Gastrointestinal:  Positive for abdominal pain, diarrhea, nausea and vomiting.    Physical Exam Vital Signs  I have reviewed the triage vital signs BP (!) 147/67   Pulse (!) 113   Temp 99.1 F (37.3 C) (Oral)   Resp (!) 22   Ht 5\' 6"  (1.676 m)   Wt 89.8 kg   SpO2 94%   BMI 31.96 kg/m   Physical Exam Vitals and nursing note reviewed.  Constitutional:      General: She is not in acute distress.    Appearance: She is well-developed. She is ill-appearing.  HENT:     Head: Normocephalic and atraumatic.  Eyes:     Conjunctiva/sclera: Conjunctivae normal.  Cardiovascular:     Rate and Rhythm: Regular rhythm. Tachycardia present.     Heart sounds: No murmur heard. Pulmonary:     Effort: Pulmonary effort is normal. No respiratory distress.     Breath sounds: Normal breath sounds.  Abdominal:     Palpations: Abdomen is soft.     Tenderness: There is  abdominal tenderness.  Musculoskeletal:        General: No swelling.     Cervical back: Neck supple.  Skin:    General: Skin is warm and dry.     Capillary Refill: Capillary refill takes less than 2 seconds.  Neurological:     Mental Status: She is alert.  Psychiatric:        Mood and Affect: Mood normal.     ED Results and Treatments Labs (all labs ordered are listed, but only abnormal results are displayed) Labs Reviewed  BASIC METABOLIC PANEL - Abnormal; Notable for the following components:      Result Value   Sodium 131 (*)    CO2 13 (*)    Glucose, Bld 554 (*)  Creatinine, Ser 1.31 (*)    GFR, Estimated 48 (*)    Anion gap 20 (*)    All other components within normal limits  BETA-HYDROXYBUTYRIC ACID - Abnormal; Notable for the following components:   Beta-Hydroxybutyric Acid 6.40 (*)    All other components within normal limits  CBC WITH DIFFERENTIAL/PLATELET - Abnormal; Notable for the following components:   WBC 20.8 (*)    Neutro Abs 15.4 (*)    Lymphs Abs 4.2 (*)    Monocytes Absolute 1.2 (*)    All other components within normal limits  CBG MONITORING, ED - Abnormal; Notable for the following components:   Glucose-Capillary 475 (*)    All other components within normal limits  I-STAT VENOUS BLOOD GAS, ED - Abnormal; Notable for the following components:   pCO2, Ven 21.9 (*)    pO2, Ven 158 (*)    Bicarbonate 13.7 (*)    TCO2 14 (*)    Acid-base deficit 9.0 (*)    Sodium 132 (*)    Calcium, Ion 1.13 (*)    Hemoglobin 15.6 (*)    All other components within normal limits  CBG MONITORING, ED - Abnormal; Notable for the following components:   Glucose-Capillary 301 (*)    All other components within normal limits  CBG MONITORING, ED - Abnormal; Notable for the following components:   Glucose-Capillary 268 (*)    All other components within normal limits  CBG MONITORING, ED - Abnormal; Notable for the following components:   Glucose-Capillary 254 (*)     All other components within normal limits  CBG MONITORING, ED - Abnormal; Notable for the following components:   Glucose-Capillary 233 (*)    All other components within normal limits  HIV ANTIBODY (ROUTINE TESTING W REFLEX)  BASIC METABOLIC PANEL  BASIC METABOLIC PANEL  BASIC METABOLIC PANEL  BASIC METABOLIC PANEL  BETA-HYDROXYBUTYRIC ACID  BETA-HYDROXYBUTYRIC ACID  HEMOGLOBIN A1C                                                                                                                          Radiology CT ABDOMEN PELVIS W CONTRAST  Result Date: 10/14/2022 CLINICAL DATA:  Right lower quadrant abdominal pain. EXAM: CT ABDOMEN AND PELVIS WITH CONTRAST TECHNIQUE: Multidetector CT imaging of the abdomen and pelvis was performed using the standard protocol following bolus administration of intravenous contrast. RADIATION DOSE REDUCTION: This exam was performed according to the departmental dose-optimization program which includes automated exposure control, adjustment of the mA and/or kV according to patient size and/or use of iterative reconstruction technique. CONTRAST:  75mL OMNIPAQUE IOHEXOL 350 MG/ML SOLN COMPARISON:  December 15, 2020 FINDINGS: Lower chest: Left lower lobe solitary cyst. Hepatobiliary: Hepatic steatosis. Normal appearance of the gallbladder. Pancreas: Unremarkable. No pancreatic ductal dilatation or surrounding inflammatory changes. Spleen: Normal in size without focal abnormality. Adrenals/Urinary Tract: Adrenal glands are unremarkable. Kidneys are normal, without renal calculi, focal lesion, or hydronephrosis. Bladder is unremarkable. Stomach/Bowel: Stomach is within normal  limits. Appendix appears normal. No evidence of bowel wall thickening, distention, or inflammatory changes. Mild left colonic diverticulosis. Vascular/Lymphatic: Aortic atherosclerosis. No enlarged abdominal or pelvic lymph nodes. Reproductive: Status post hysterectomy. No adnexal masses. Other: No  abdominal wall hernia or abnormality. No abdominopelvic ascites. Musculoskeletal: No acute or significant osseous findings. IMPRESSION: 1. No acute abnormality identified within the abdomen or pelvis. 2. Hepatic steatosis. 3. Mild left colonic diverticulosis without evidence of diverticulitis. 4. Aortic atherosclerosis. Aortic Atherosclerosis (ICD10-I70.0). Electronically Signed   By: Ted Mcalpine M.D.   On: 10/14/2022 14:53    Pertinent labs & imaging results that were available during my care of the patient were reviewed by me and considered in my medical decision making (see MDM for details).  Medications Ordered in ED Medications  insulin regular, human (MYXREDLIN) 100 units/ 100 mL infusion (4.4 Units/hr Intravenous Rate/Dose Change 10/14/22 1734)  oxyCODONE (Oxy IR/ROXICODONE) immediate release tablet 10 mg (10 mg Oral Given 10/14/22 1630)  pravastatin (PRAVACHOL) tablet 20 mg (20 mg Oral Given 10/14/22 1856)  buPROPion (WELLBUTRIN XL) 24 hr tablet 300 mg (300 mg Oral Given 10/14/22 1855)  traZODone (DESYREL) tablet 100 mg (has no administration in time range)  tiZANidine (ZANAFLEX) tablet 4 mg (4 mg Oral Given 10/14/22 1629)  loratadine (CLARITIN) tablet 10 mg (10 mg Oral Given 10/14/22 1629)  enoxaparin (LOVENOX) injection 40 mg (40 mg Subcutaneous Given 10/14/22 1630)  lactated ringers infusion (has no administration in time range)  dextrose 5 % in lactated ringers infusion ( Intravenous New Bag/Given 10/14/22 1843)  dextrose 50 % solution 0-50 mL (has no administration in time range)  lactated ringers bolus 1,000 mL (1,000 mLs Intravenous Bolus 10/14/22 1637)  acetaminophen (TYLENOL) tablet 650 mg (has no administration in time range)  ondansetron (ZOFRAN) injection 4 mg (has no administration in time range)  lactated ringers bolus 1,796 mL (1,796 mLs Intravenous Bolus 10/14/22 1242)  fentaNYL (SUBLIMAZE) injection 50 mcg (50 mcg Intravenous Given 10/14/22 1312)  ondansetron (ZOFRAN)  injection 4 mg (4 mg Intravenous Given 10/14/22 1311)  potassium chloride 10 mEq in 100 mL IVPB (0 mEq Intravenous Stopped 10/14/22 1734)  LORazepam (ATIVAN) injection 0.5 mg (0.5 mg Intravenous Given 10/14/22 1401)  iohexol (OMNIPAQUE) 350 MG/ML injection 75 mL (75 mLs Intravenous Contrast Given 10/14/22 1418)                                                                                                                                     Procedures .Critical Care  Performed by: Glendora Score, MD Authorized by: Glendora Score, MD   Critical care provider statement:    Critical care time (minutes):  30   Critical care was necessary to treat or prevent imminent or life-threatening deterioration of the following conditions:  Endocrine crisis   Critical care was time spent personally by me on the following activities:  Development of treatment plan with patient or surrogate, discussions with  consultants, evaluation of patient's response to treatment, examination of patient, ordering and review of laboratory studies, ordering and review of radiographic studies, ordering and performing treatments and interventions, pulse oximetry, re-evaluation of patient's condition and review of old charts   (including critical care time)  Medical Decision Making / ED Course   This patient presents to the ED for concern of hyperglycemia, abdominal pain, this involves an extensive number of treatment options, and is a complaint that carries with it a high risk of complications and morbidity.  The differential diagnosis includes DKA, HHS, stress hyperglycemia, intra-abdominal infection, gastroparesis  MDM: Patient seen emergency room for evaluation of abdominal pain nausea vomiting and hyperglycemia.  Physical exam with tenderness in the right lower quadrant and over the suprapubic region but is otherwise unremarkable.  Initial laboratory evaluation with a pH of 7.4 but beta-hydroxybutyrate is 6.40 with an  initial glucose of 554 and an anion gap of 20.,  Bicarb 13.  Significant leukocytosis to 20.8.  CT abdomen pelvis is negative for acute intra-abdominal pathology.  Patient started on fluid resuscitation, insulin drip and will require hospital admission for developing DKA.  Patient admitted   Additional history obtained:  -External records from outside source obtained and reviewed including: Chart review including previous notes, labs, imaging, consultation notes   Lab Tests: -I ordered, reviewed, and interpreted labs.   The pertinent results include:   Labs Reviewed  BASIC METABOLIC PANEL - Abnormal; Notable for the following components:      Result Value   Sodium 131 (*)    CO2 13 (*)    Glucose, Bld 554 (*)    Creatinine, Ser 1.31 (*)    GFR, Estimated 48 (*)    Anion gap 20 (*)    All other components within normal limits  BETA-HYDROXYBUTYRIC ACID - Abnormal; Notable for the following components:   Beta-Hydroxybutyric Acid 6.40 (*)    All other components within normal limits  CBC WITH DIFFERENTIAL/PLATELET - Abnormal; Notable for the following components:   WBC 20.8 (*)    Neutro Abs 15.4 (*)    Lymphs Abs 4.2 (*)    Monocytes Absolute 1.2 (*)    All other components within normal limits  CBG MONITORING, ED - Abnormal; Notable for the following components:   Glucose-Capillary 475 (*)    All other components within normal limits  I-STAT VENOUS BLOOD GAS, ED - Abnormal; Notable for the following components:   pCO2, Ven 21.9 (*)    pO2, Ven 158 (*)    Bicarbonate 13.7 (*)    TCO2 14 (*)    Acid-base deficit 9.0 (*)    Sodium 132 (*)    Calcium, Ion 1.13 (*)    Hemoglobin 15.6 (*)    All other components within normal limits  CBG MONITORING, ED - Abnormal; Notable for the following components:   Glucose-Capillary 301 (*)    All other components within normal limits  CBG MONITORING, ED - Abnormal; Notable for the following components:   Glucose-Capillary 268 (*)    All  other components within normal limits  CBG MONITORING, ED - Abnormal; Notable for the following components:   Glucose-Capillary 254 (*)    All other components within normal limits  CBG MONITORING, ED - Abnormal; Notable for the following components:   Glucose-Capillary 233 (*)    All other components within normal limits  HIV ANTIBODY (ROUTINE TESTING W REFLEX)  BASIC METABOLIC PANEL  BASIC METABOLIC PANEL  BASIC METABOLIC PANEL  BASIC METABOLIC  PANEL  BETA-HYDROXYBUTYRIC ACID  BETA-HYDROXYBUTYRIC ACID  HEMOGLOBIN A1C        Imaging Studies ordered: I ordered imaging studies including CTAP I independently visualized and interpreted imaging. I agree with the radiologist interpretation   Medicines ordered and prescription drug management: Meds ordered this encounter  Medications   lactated ringers bolus 1,796 mL   fentaNYL (SUBLIMAZE) injection 50 mcg   ondansetron (ZOFRAN) injection 4 mg   insulin regular, human (MYXREDLIN) 100 units/ 100 mL infusion    Order Specific Question:   EndoTool low target:    Answer:   140    Order Specific Question:   EndoTool high target:    Answer:   180    Order Specific Question:   Type of Diabetes    Answer:   Type 1    Order Specific Question:   Mode of Therapy    Answer:   ENDOX1 for DKA    Order Specific Question:   Start Method    Answer:   EndoTool to calculate   DISCONTD: lactated ringers infusion   DISCONTD: dextrose 5 % in lactated ringers infusion   DISCONTD: dextrose 50 % solution 0-50 mL   potassium chloride 10 mEq in 100 mL IVPB   LORazepam (ATIVAN) injection 0.5 mg   iohexol (OMNIPAQUE) 350 MG/ML injection 75 mL   oxyCODONE (Oxy IR/ROXICODONE) immediate release tablet 10 mg   pravastatin (PRAVACHOL) tablet 20 mg   buPROPion (WELLBUTRIN XL) 24 hr tablet 300 mg   traZODone (DESYREL) tablet 100 mg   tiZANidine (ZANAFLEX) tablet 4 mg   loratadine (CLARITIN) tablet 10 mg   enoxaparin (LOVENOX) injection 40 mg   lactated  ringers infusion   dextrose 5 % in lactated ringers infusion   dextrose 50 % solution 0-50 mL   lactated ringers bolus 1,000 mL   acetaminophen (TYLENOL) tablet 650 mg   ondansetron (ZOFRAN) injection 4 mg    -I have reviewed the patients home medicines and have made adjustments as needed  Critical interventions Insulin, fluids    Cardiac Monitoring: The patient was maintained on a cardiac monitor.  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

## 2022-10-14 NOTE — ED Notes (Addendum)
Patient is being discharged from the Urgent Care and sent to the Emergency Department via [facility] . Per provider, patient is in need of higher level of care due to hyperglycemia, diabetic emergency. Patient is aware and verbalizes understanding of plan of care.  Vitals:   10/14/22 1105  BP: (!) 180/115  Pulse: (!) 130  Resp: (!) 23  Temp: 98 F (36.7 C)  SpO2: 96%

## 2022-10-14 NOTE — ED Triage Notes (Signed)
Patient here today with c/o N&V, diarrhea X 2 days. She has not been able to eat anything. Patient has a h/o gastroparesis. She tried to take something for nausea but she was unable to keep it down.

## 2022-10-14 NOTE — H&P (Signed)
History and Physical    Patient: Catherine Pope ZOX:[id number] DOB: 1966-02-29 DOA: 10/14/2022 DOS: the patient was seen and examined on 10/14/2022 PCP: Courtney Paris, NP  Patient coming from: Home - lives with 2 older daughters and 3 younger adopted daughters (16, 51, 86); NOK: Mother, Wallace Dietsch, [phone]   Chief Complaint: n/v/d  HPI: Catherine Pope is a 57 y.o. female with medical history significant of depression/anxiety, DM, and HTN presenting with abdominal pain, n/v/d.   She reports that she started about 3 days ago with abdominal pain and n/v/d.  She thought that it was related to gastroparesis and so tried to ignore it but symptoms were persistent and worsening.  She has remote h/o use of insulin but none in maybe 5 years since she was changed to Ozempic.  She began having difficulty obtaining it and so was changed to [facility] about 3 months ago.  At that time, her A1c was in the mid-7 range.  However, she has not had any access to [facility] since her pharmacy couldn't provide it for the last 3 months.  She finally obtained it last Friday and gave herself her first injection.    ER Course:  DKA due to noncompliance in the setting of behavioral health.  Abd CT negative.     Review of Systems: As mentioned in the history of present illness. All other systems reviewed and are negative. 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   per pt tested a month ago/ no results yet   Hypertension    Migraine    Neck pain    Seasonal allergies    Sinusitis    Past Surgical History:  Procedure Laterality Date   ABDOMINAL HYSTERECTOMY     per pt , in her 90's   ANTERIOR CERVICAL DECOMP/DISCECTOMY FUSION N/A 12/27/2016   Procedure: CERVICAL SIX-SEVEN ANTERIOR CERVICAL DECOMPRESSION/DISCECTOMY FUSION;  Surgeon: Tia Alert, MD;  Location: [facility] OR;  Service: Neurosurgery;  Laterality: N/A;   ANTERIOR CRUCIATE LIGAMENT REPAIR     right knee   BREAST EXCISIONAL BIOPSY Left over 20 years ago   benign   BREAST SURGERY     2 lumps removed at age 65, left breast/ benign   RADIOLOGY WITH ANESTHESIA Left 12/11/2017   Procedure: MRI cervical spine and left shoulder WITH ANESTHESIA;  Surgeon: Radiologist, Medication, MD;  Location: [facility] OR;  Service: Radiology;  Laterality: Left;   SHOULDER ARTHROSCOPY     right shoulder   Social History:  reports that she has been smoking cigarettes. She has a 7.5 pack-year smoking history. She has never used smokeless tobacco. She reports that she does not drink alcohol and does not use drugs.  No Known Allergies  Family History  Problem Relation Age of Onset   Hypertension Mother    Heart Problems Father    Diabetes Father    Hypertension Other    Heart disease Other    Stroke Sister    Kidney disease Brother     Prior to Admission medications   Medication Sig Start Date End Date Taking? Authorizing Provider  buPROPion (WELLBUTRIN XL) 300 MG 24 hr tablet Take 300 mg by mouth daily. 04/05/21  Yes McCoy, Fleet Contras, NP  cetirizine (ZYRTEC) 10 MG tablet Take 10 mg by mouth  at bedtime. 04/20/22  Yes [provider]  Galcanezumab-gnlm [facility]) 120 MG/ML SOAJ  01/05/20  Yes Courtney Paris, NP  hydrochlorothiazide (HYDRODIURIL) 25 MG tablet Take 1 tablet (25 mg total) by mouth daily. 08/20/18  Yes Kallie Locks, FNP  lisinopril (ZESTRIL) 20 MG tablet Take 1 tablet (20 mg total) by mouth daily. 08/20/18  Yes Kallie Locks, FNP  lovastatin (MEVACOR) 20 MG tablet TAKE 1 TABLET (20 MG TOTAL) BY MOUTH AT BEDTIME. 06/02/19  Yes Kallie Locks, FNP  metoCLOPramide (REGLAN) 5 MG tablet Take by mouth.   Yes McCoy, Fleet Contras, NP  ondansetron (ZOFRAN ODT) 4 MG disintegrating tablet Take 1 tablet (4 mg total) by mouth every 8 (eight) hours as needed for up to 12 doses for nausea or  vomiting. 12/15/20  Yes Gloris Manchester, MD  Oxycodone HCl 10 MG TABS Take 10 mg by mouth in the morning, at noon, and at bedtime.   Yes [provider]  tirzepatide Greggory Keen) 15 MG/0.5ML Pen Inject 15 mg into the skin once a week. Injection on Fridays   Yes [provider]  TIZANIDINE HCL PO Take 1 tablet by mouth in the morning, at noon, and at bedtime.   Yes [provider]  traZODone (DESYREL) 100 MG tablet Take 1 tablet (100 mg total) by mouth at bedtime as needed for sleep. 05/18/21 10/14/22 Yes Arfeen, Phillips Grout, MD  Lancets [facility] ULTRASOFT) lancets Use as instructed 06/22/14   Toy Cookey, MD    Physical Exam: Vitals:   10/14/22 1330 10/14/22 1430 10/14/22 1500 10/14/22 1616  BP: (!) 208/139 (!) 171/78 (!) 177/81   Pulse: (!) 128 (!) 127 (!) 132   Resp: 13 17    Temp:    99.1 F (37.3 C)  TempSrc:    Oral  SpO2: 100% 97% 100%   Weight:      Height:       General:  Appears calm and comfortable and is in NAD Eyes:   EOMI, normal lids, iris ENT:  grossly normal hearing, lips; dry tongue with white patches concerning for thrush, moderately dry mm; edentulous Neck:  no LAD, masses or thyromegaly Cardiovascular:  RR with tachycardia, no m/r/g. No LE edema.  Respiratory:   CTA bilaterally with no wheezes/rales/rhonchi.  Normal respiratory effort. Abdomen:  soft, mildly diffusely TTP, obese Skin:  no rash or induration seen on limited exam Musculoskeletal:  grossly normal tone BUE/BLE, good ROM, no bony abnormality Lower extremity:  No LE edema.  Limited foot exam with no ulcerations.  2+ distal pulses. Psychiatric:  blunted mood and affect, speech fluent and appropriate, AOx3 Neurologic:  CN 2-12 grossly intact, moves all extremities in coordinated fashion   Radiological Exams on Admission: Independently reviewed - see discussion in A/P where applicable  CT ABDOMEN PELVIS W CONTRAST  Result Date: 10/14/2022 CLINICAL DATA:  Right lower quadrant  abdominal pain. EXAM: CT ABDOMEN AND PELVIS WITH CONTRAST TECHNIQUE: Multidetector CT imaging of the abdomen and pelvis was performed using the standard protocol following bolus administration of intravenous contrast. RADIATION DOSE REDUCTION: This exam was performed according to the departmental dose-optimization program which includes automated exposure control, adjustment of the mA and/or kV according to patient size and/or use of iterative reconstruction technique. CONTRAST:  75mL OMNIPAQUE IOHEXOL 350 MG/ML SOLN COMPARISON:  December 15, 2020 FINDINGS: Lower chest: Left lower lobe solitary cyst. Hepatobiliary: Hepatic steatosis. Normal appearance of the gallbladder. Pancreas: Unremarkable. No pancreatic ductal dilatation or surrounding inflammatory changes. Spleen: Normal in  size without focal abnormality. Adrenals/Urinary Tract: Adrenal glands are unremarkable. Kidneys are normal, without renal calculi, focal lesion, or hydronephrosis. Bladder is unremarkable. Stomach/Bowel: Stomach is within normal limits. Appendix appears normal. No evidence of bowel wall thickening, distention, or inflammatory changes. Mild left colonic diverticulosis. Vascular/Lymphatic: Aortic atherosclerosis. No enlarged abdominal or pelvic lymph nodes. Reproductive: Status post hysterectomy. No adnexal masses. Other: No abdominal wall hernia or abnormality. No abdominopelvic ascites. Musculoskeletal: No acute or significant osseous findings. IMPRESSION: 1. No acute abnormality identified within the abdomen or pelvis. 2. Hepatic steatosis. 3. Mild left colonic diverticulosis without evidence of diverticulitis. 4. Aortic atherosclerosis. Aortic Atherosclerosis (ICD10-I70.0). Electronically Signed   By: Ted Mcalpine M.D.   On: 10/14/2022 14:53    EKG: pending   Labs on Admission: I have personally reviewed the available labs and imaging studies at the time of the admission.  Pertinent labs:    VBG: 7.404/21.9/13.7 Na++  131 CO2 13 Glucose 554 BUN 12/Creatinine 1.31/GFR 48; 14/0.8/>60 in 12/2020 Anion gap 20 WBC 20.8 Beta-hydroxybutyrate 6.4 UA: >1000 glucose, >160 ketones, >300 protein   Assessment and Plan: Principal Problem:   DKA (diabetic ketoacidosis) (HCC) Active Problems:   Dyslipidemia   Essential hypertension   Low back pain   Depression    DKA -Patient with uncertain baseline control, A1c pending -She was remotely treated with insulin but recently only with GLP-1 agonists -She has had difficulty obtaining the GLP-1 medications and so has been untreated for about the last 3 months -She just started [facility] last week - unsure if Mounjaro was related to onset of symptoms or just coincidence -No indication of illness as source -Moderate DKA on admission based on normal pH, HCO3 13, anion gap >12, + beta-hydroxybutyrate, patient alert  -Will observe in progressive care with DKA protocol -K+ normal at time of presentation and so potassium supplementation added -Additional 2L bolus LR now -IVF at 150 cc/hr, LR until glucose <250 and then decrease rate to 125 and change to D5LR -Patient appears to need education - diabetes coordinator and dietician consulted; and medications/PCP - TOC consult requested  AKI -Associated with severe dehydration in the setting of DKA -Aggressive IVF hydration -BMP q4 hours -Avoid nephrotoxic agents  Depression -Continue Wellbutrin, trazodone  HTN -Hold hydrochlorothiazide, lisinopril in the setting of AKI  HLD -Continue lovastatin  Chronic pain -I have reviewed this patient in the [place] Controlled Substances Reporting System.  She is mostly receiving medications from only one provider and appears to be taking them as prescribed. -She is at high risk of opioid misuse, diversion, or overdose -Continue Oxy and Tizanidine TID without escalation  Obesity -Body mass index is 31.96 kg/m..  -Weight loss should be encouraged -Outpatient PCP/bariatric  medicine f/u encouraged     Advance Care Planning:   Code Status: Full Code   Consults: Nutrition; Diabetes coordinator; TOC team   DVT Prophylaxis: Lovenox  Family Communication: None present; she declined to have me call family at the time of admission  Severity of Illness: The appropriate patient status for this patient is OBSERVATION. Observation status is judged to be reasonable and necessary in order to provide the required intensity of service to ensure the patient's safety. The patient's presenting symptoms, physical exam findings, and initial radiographic and laboratory data in the context of their medical condition is felt to place them at decreased risk for further clinical deterioration. Furthermore, it is anticipated that the patient will be medically stable for discharge from the hospital within 2 midnights of  admission.   Author: Jonah Blue, MD 10/14/2022 4:33 PM  For on call review [email or web address].

## 2022-10-14 NOTE — ED Notes (Signed)
ED TO INPATIENT HANDOFF REPORT  ED Nurse Name and Phone #:  Theophilus Bones [phone]  S Name/Age/Gender Catherine Pope 57 y.o. female Room/Bed: 020C/020C  Code Status   Code Status: Prior  Home/SNF/Other Home Patient oriented to: self, place, time, and situation Is this baseline? Yes   Triage Complete: Triage complete  Chief Complaint DKA (diabetic ketoacidosis) (HCC) [E11.10]  Triage Note Pt present to ED from urgent care with c/o abdominal pain, cramping in nature, n/v/d times three days. Pt blood sugar noted greater than 500 at urgent care. Ketones noted in urine per urgent care. Pt A&Ox4 at this time. Pt continues to c/o abdominal pain.   Allergies No Known Allergies  Level of Care/Admitting Diagnosis ED Disposition     ED Disposition  Admit   Condition  --   Comment  Hospital Area: [facility] [[id number]]  Level of Care: Progressive [102]  Admit to Progressive based on following criteria: GI, ENDOCRINE disease patients with GI bleeding, acute liver failure or pancreatitis, stable with diabetic ketoacidosis or thyrotoxicosis (hypothyroid) state.  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   per pt tested a month ago/ no results yet   Hypertension    Migraine    Neck pain    Seasonal allergies    Sinusitis    Past Surgical History:  Procedure Laterality Date   ABDOMINAL HYSTERECTOMY     per pt , in her 49's    ANTERIOR CERVICAL DECOMP/DISCECTOMY FUSION N/A 12/27/2016   Procedure: CERVICAL SIX-SEVEN ANTERIOR CERVICAL DECOMPRESSION/DISCECTOMY FUSION;  Surgeon: Tia Alert, MD;  Location: [facility] OR;  Service: Neurosurgery;  Laterality: N/A;   ANTERIOR CRUCIATE LIGAMENT REPAIR     right knee   BREAST EXCISIONAL BIOPSY Left over 20 years ago   benign   BREAST SURGERY     2 lumps removed at age 31, left breast/ benign   RADIOLOGY WITH ANESTHESIA Left 12/11/2017   Procedure: MRI cervical spine and left shoulder WITH ANESTHESIA;  Surgeon: Radiologist, Medication, MD;  Location: [facility] OR;  Service: Radiology;  Laterality: Left;   SHOULDER ARTHROSCOPY     right shoulder     A IV Location/Drains/Wounds Patient Lines/Drains/Airways Status     Active Line/Drains/Airways     Name Placement date Placement time Site Days   Peripheral IV 10/14/22 20 G Left;Posterior Forearm 10/14/22  1152  Forearm  less than 1   Peripheral IV 10/14/22 22 G 1" Posterior;Right Hand 10/14/22  1432  Hand  less than 1            Intake/Output Last 24 hours No intake or output data in the 24 hours ending 10/14/22 1544  Labs/Imaging Results for orders placed or performed during the hospital encounter of 10/14/22 (from the past 48 hour(s))  CBG monitoring, ED     Status: Abnormal   Collection Time: 10/14/22 12:35 PM  Result Value Ref Range  Glucose-Capillary 475 (H) 70 - 99 mg/dL    Comment: Glucose reference range applies only to samples taken after fasting for at least 8 hours.   Comment 1 Document in Chart   Basic metabolic panel     Status: Abnormal   Collection Time: 10/14/22 12:35 PM  Result Value Ref Range   Sodium 131 (L) 135 - 145 mmol/L   Potassium 4.2 3.5 - 5.1 mmol/L   Chloride 98 98 - 111 mmol/L   CO2 13 (L) 22 - 32 mmol/L   Glucose, Bld 554 (HH) 70 - 99 mg/dL    Comment: CRITICAL RESULT CALLED TO, READ BACK BY AND VERIFIED WITH C Kaela Beitz RN AT [phone] BY D LONG Glucose reference range applies only to  samples taken after fasting for at least 8 hours.    BUN 12 6 - 20 mg/dL   Creatinine, Ser 0.98 (H) 0.44 - 1.00 mg/dL   Calcium 9.5 8.9 - 11.9 mg/dL   GFR, Estimated 48 (L) >60 mL/min    Comment: (NOTE) Calculated using the CKD-EPI Creatinine Equation (2021)    Anion gap 20 (H) 5 - 15    Comment: Performed at [facility], [address] [place], [place] [address]  Beta-hydroxybutyric acid     Status: Abnormal   Collection Time: 10/14/22 12:35 PM  Result Value Ref Range   Beta-Hydroxybutyric Acid 6.40 (H) 0.05 - 0.27 mmol/L    Comment: RESULT CONFIRMED BY MANUAL DILUTION Performed at [facility], [address], [place], [place] [address]   CBC with Differential (PNL)     Status: Abnormal   Collection Time: 10/14/22 12:35 PM  Result Value Ref Range   WBC 20.8 (H) 4.0 - 10.5 K/uL   RBC 4.96 3.87 - 5.11 MIL/uL   Hemoglobin 14.4 12.0 - 15.0 g/dL   HCT 30.8 65.7 - 84.6 %   MCV 85.7 80.0 - 100.0 fL   MCH 29.0 26.0 - 34.0 pg   MCHC 33.9 30.0 - 36.0 g/dL   RDW 96.2 95.2 - 84.1 %   Platelets 299 150 - 400 K/uL   nRBC 0.0 0.0 - 0.2 %   Neutrophils Relative % 74 %   Neutro Abs 15.4 (H) 1.7 - 7.7 K/uL   Lymphocytes Relative 20 %   Lymphs Abs 4.2 (H) 0.7 - 4.0 K/uL   Monocytes Relative 6 %   Monocytes Absolute 1.2 (H) 0.1 - 1.0 K/uL   Eosinophils Relative 0 %   Eosinophils Absolute 0.0 0.0 - 0.5 K/uL   Basophils Relative 0 %   Basophils Absolute 0.0 0.0 - 0.1 K/uL   nRBC 0 0 /100 WBC   Abs Immature Granulocytes 0.00 0.00 - 0.07 K/uL    Comment: Performed at [facility], [address], [place], [place] [address]  I-Stat Venous Blood Gas, ED     Status: Abnormal   Collection Time: 10/14/22  1:03 PM  Result Value Ref Range   pH, Ven 7.404 7.25 - 7.43   pCO2, Ven 21.9 (L) 44 - 60 mmHg   pO2, Ven 158 (H) 32 - 45 mmHg   Bicarbonate 13.7 (L) 20.0 - 28.0 mmol/L   TCO2 14 (L) 22 - 32 mmol/L   O2 Saturation 99 %   Acid-base deficit 9.0 (H) 0.0 - 2.0 mmol/L    Sodium 132 (L) 135 - 145 mmol/L   Potassium 4.4 3.5 - 5.1 mmol/L   Calcium, Ion 1.13 (L) 1.15 - 1.40 mmol/L  HCT 46.0 36.0 - 46.0 %   Hemoglobin 15.6 (H) 12.0 - 15.0 g/dL   Sample type VENOUS   CBG monitoring, ED     Status: Abnormal   Collection Time: 10/14/22  3:16 PM  Result Value Ref Range   Glucose-Capillary 301 (H) 70 - 99 mg/dL    Comment: Glucose reference range applies only to samples taken after fasting for at least 8 hours.   Comment 1 Document in Chart    CT ABDOMEN PELVIS W CONTRAST  Result Date: 10/14/2022 CLINICAL DATA:  Right lower quadrant abdominal pain. EXAM: CT ABDOMEN AND PELVIS WITH CONTRAST TECHNIQUE: Multidetector CT imaging of the abdomen and pelvis was performed using the standard protocol following bolus administration of intravenous contrast. RADIATION DOSE REDUCTION: This exam was performed according to the departmental dose-optimization program which includes automated exposure control, adjustment of the mA and/or kV according to patient size and/or use of iterative reconstruction technique. CONTRAST:  75mL OMNIPAQUE IOHEXOL 350 MG/ML SOLN COMPARISON:  December 15, 2020 FINDINGS: Lower chest: Left lower lobe solitary cyst. Hepatobiliary: Hepatic steatosis. Normal appearance of the gallbladder. Pancreas: Unremarkable. No pancreatic ductal dilatation or surrounding inflammatory changes. Spleen: Normal in size without focal abnormality. Adrenals/Urinary Tract: Adrenal glands are unremarkable. Kidneys are normal, without renal calculi, focal lesion, or hydronephrosis. Bladder is unremarkable. Stomach/Bowel: Stomach is within normal limits. Appendix appears normal. No evidence of bowel wall thickening, distention, or inflammatory changes. Mild left colonic diverticulosis. Vascular/Lymphatic: Aortic atherosclerosis. No enlarged abdominal or pelvic lymph nodes. Reproductive: Status post hysterectomy. No adnexal masses. Other: No abdominal wall hernia or abnormality. No  abdominopelvic ascites. Musculoskeletal: No acute or significant osseous findings. IMPRESSION: 1. No acute abnormality identified within the abdomen or pelvis. 2. Hepatic steatosis. 3. Mild left colonic diverticulosis without evidence of diverticulitis. 4. Aortic atherosclerosis. Aortic Atherosclerosis (ICD10-I70.0). Electronically Signed   By: Ted Mcalpine M.D.   On: 10/14/2022 14:53    Pending Labs Unresulted Labs (From admission, onward)     Start     Ordered   10/14/22 1225  Basic metabolic panel  (Diabetes Ketoacidosis (DKA))  STAT Now then every 4 hours ,   STAT      10/14/22 1225   10/14/22 1225  Beta-hydroxybutyric acid  (Diabetes Ketoacidosis (DKA))  Now then every 8 hours,   STAT (with URGENT occurrences)      10/14/22 1225   10/14/22 1225  Urinalysis, Routine w reflex microscopic -Urine, Clean Catch  (Diabetes Ketoacidosis (DKA))  ONCE - STAT,   URGENT       Question:  Specimen Source  Answer:  Urine, Clean Catch   10/14/22 1225            Vitals/Pain Today's Vitals   10/14/22 1300 10/14/22 1330 10/14/22 1430 10/14/22 1500  BP: (!) 196/95 (!) 208/139 (!) 171/78 (!) 177/81  Pulse: (!) 123 (!) 128 (!) 127 (!) 132  Resp: 18 13 17    Temp:      TempSrc:      SpO2: 99% 100% 97% 100%  Weight:      Height:      PainSc:        Isolation Precautions No active isolations  Medications Medications  insulin regular, human (MYXREDLIN) 100 units/ 100 mL infusion (4.8 Units/hr Intravenous Rate/Dose Change 10/14/22 1519)  lactated ringers infusion (has no administration in time range)  dextrose 5 % in lactated ringers infusion (has no administration in time range)  dextrose 50 % solution 0-50 mL (has no administration in  time range)  potassium chloride 10 mEq in 100 mL IVPB (10 mEq Intravenous New Bag/Given 10/14/22 1435)  lactated ringers bolus 1,796 mL (1,796 mLs Intravenous Bolus 10/14/22 1242)  fentaNYL (SUBLIMAZE) injection 50 mcg (50 mcg Intravenous Given 10/14/22 1312)   ondansetron (ZOFRAN) injection 4 mg (4 mg Intravenous Given 10/14/22 1311)  LORazepam (ATIVAN) injection 0.5 mg (0.5 mg Intravenous Given 10/14/22 1401)  iohexol (OMNIPAQUE) 350 MG/ML injection 75 mL (75 mLs Intravenous Contrast Given 10/14/22 1418)    Mobility walks     Focused Assessments GI   R Recommendations: See Admitting Provider Note  Report given to:   Additional Notes:

## 2022-10-14 NOTE — Discharge Instructions (Addendum)
Sent to ED via [facility] 

## 2022-10-14 NOTE — ED Provider Notes (Signed)
[facility]    CSN: [id number] Arrival date & time: 10/14/22  1020     History   Chief Complaint Chief Complaint  Patient presents with   Abdominal Pain    HPI Catherine Pope is a 57 y.o. female.  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   per pt tested a month ago/ no results yet   Hypertension    Migraine    Neck pain    Seasonal allergies    Sinusitis     Patient Active Problem List   Diagnosis Date Noted   Right lumbar radiculopathy 06/03/2019   Diabetes (HCC) 10/15/2018   LGSIL on Pap smear of cervix 12/28/2017   Lumbosacral disc herniation 09/12/2017   Hoarseness 04/30/2017   Laryngopharyngeal reflux (LPR) 04/30/2017   Perennial allergic rhinitis 04/30/2017   Chronic pansinusitis 04/30/2017   Cervical vertebral fusion 12/27/2016   Essential hypertension 03/23/2016   Laryngitis, acute 03/23/2016   Neuropathy 03/23/2016   Tobacco dependence 03/23/2016   Morbid obesity (HCC) 11/26/2013   Migraine without aura 11/26/2013   Other and unspecified hyperlipidemia 11/26/2013   Low back pain 03/23/2013    Past Surgical History:  Procedure Laterality Date   ABDOMINAL HYSTERECTOMY     per pt , in her 56's   ANTERIOR CERVICAL DECOMP/DISCECTOMY FUSION N/A 12/27/2016   Procedure: CERVICAL SIX-SEVEN ANTERIOR CERVICAL DECOMPRESSION/DISCECTOMY FUSION;  Surgeon: Tia Alert, MD;  Location: [facility] OR;  Service: Neurosurgery;  Laterality: N/A;    ANTERIOR CRUCIATE LIGAMENT REPAIR     right knee   BREAST EXCISIONAL BIOPSY Left over 20 years ago   benign   BREAST SURGERY     2 lumps removed at age 30, left breast/ benign   RADIOLOGY WITH ANESTHESIA Left 12/11/2017   Procedure: MRI cervical spine and left shoulder WITH ANESTHESIA;  Surgeon: Radiologist, Medication, MD;  Location: [facility] OR;  Service: Radiology;  Laterality: Left;   SHOULDER ARTHROSCOPY     right shoulder    OB History     Gravida  2   Para  2   Term  2   Preterm      AB      Living  2      SAB      IAB      Ectopic      Multiple      Live Births  2            Home Medications    Prior to Admission medications   Medication Sig Start Date End Date Taking? Authorizing Provider  buPROPion (WELLBUTRIN XL) 300 MG 24 hr tablet Take 300 mg by mouth daily. 04/05/21  Yes McCoy, Fleet Contras, NP  Galcanezumab-gnlm [facility]) 120 MG/ML SOAJ  01/05/20  Yes Courtney Paris, NP  hydrochlorothiazide (HYDRODIURIL) 25 MG tablet Take 1 tablet (25 mg total) by mouth daily. 08/20/18  Yes Kallie Locks, FNP  lisinopril (  ZESTRIL) 20 MG tablet Take 1 tablet (20 mg total) by mouth daily. 08/20/18  Yes Kallie Locks, FNP  lovastatin (MEVACOR) 20 MG tablet TAKE 1 TABLET (20 MG TOTAL) BY MOUTH AT BEDTIME. 06/02/19  Yes Kallie Locks, FNP  metoCLOPramide (REGLAN) 5 MG tablet Take by mouth.   Yes McCoy, Fleet Contras, NP  ondansetron (ZOFRAN ODT) 4 MG disintegrating tablet Take 1 tablet (4 mg total) by mouth every 8 (eight) hours as needed for up to 12 doses for nausea or vomiting. 12/15/20  Yes Gloris Manchester, MD  Oxycodone HCl 10 MG TABS Take 10 mg by mouth in the morning, at noon, and at bedtime.   Yes [provider]  Semaglutide, 1 MG/DOSE, (OZEMPIC, 1 MG/DOSE,) 2 MG/1.5ML SOPN Inject 1 mg into the skin once a week. 10/16/18  Yes Romero Belling, MD  TIZANIDINE HCL PO Take 1 tablet by mouth in the morning, at noon, and at bedtime.   Yes [provider]  traZODone  (DESYREL) 100 MG tablet Take 1 tablet (100 mg total) by mouth at bedtime as needed for sleep. 05/18/21 10/14/22 Yes Arfeen, Phillips Grout, MD  Lancets Letta Pate ULTRASOFT) lancets Use as instructed 06/22/14   Toy Cookey, MD    Family History Family History  Problem Relation Age of Onset   Hypertension Mother    Heart Problems Father    Diabetes Father    Hypertension Other    Heart disease Other    Stroke Sister    Kidney disease Brother     Social History Social History   Tobacco Use   Smoking status: Every Day    Current packs/day: 0.50    Average packs/day: 0.5 packs/day for 15.0 years (7.5 ttl pk-yrs)    Types: Cigarettes   Smokeless tobacco: Never   Tobacco comments:    less than half pack a day.  Vaping Use   Vaping status: Never Used  Substance Use Topics   Alcohol use: No   Drug use: No     Allergies   Patient has no known allergies.   Review of Systems Review of Systems  Gastrointestinal:  Positive for abdominal pain.   As per HPI  Physical Exam Triage Vital Signs ED Triage Vitals  Encounter Vitals Group     BP 10/14/22 1105 (!) 180/115     Systolic BP Percentile --      Diastolic BP Percentile --      Pulse Rate 10/14/22 1105 (!) 130     Resp 10/14/22 1105 (!) 23     Temp 10/14/22 1105 98 F (36.7 C)     Temp Source 10/14/22 1105 Oral     SpO2 10/14/22 1105 96 %     Weight 10/14/22 1103 190 lb (86.2 kg)     Height 10/14/22 1103 5\' 6"  (1.676 m)     Head Circumference --      Peak Flow --      Pain Score 10/14/22 1103 8     Pain Loc --      Pain Education --      Exclude from Growth Chart --    No data found.  Updated Vital Signs BP (!) 190/94   Pulse (!) 130   Temp 98 F (36.7 C) (Oral)   Resp 20   Ht 5\' 6"  (1.676 m)   Wt 190 lb (86.2 kg)   SpO2 96%   BMI 30.67 kg/m     Physical Exam Vitals and nursing note reviewed.  Constitutional:      General: She is in acute distress.     Appearance: Normal appearance. She is obese. She is  ill-appearing.  HENT:     Mouth/Throat:     Mouth: Mucous membranes are moist.     Pharynx: Oropharynx is clear.  Eyes:     Conjunctiva/sclera: Conjunctivae normal.  Cardiovascular:     Rate and Rhythm: Regular rhythm. Tachycardia present.     Heart sounds: Normal heart sounds.  Pulmonary:     Effort: Tachypnea present.     Breath sounds: Normal breath sounds.  Abdominal:     Palpations: Abdomen is soft.     Tenderness: There is generalized abdominal tenderness. There is no right CVA tenderness, left CVA tenderness, guarding or rebound.  Musculoskeletal:        General: Normal range of motion.  Skin:    General: Skin is warm and dry.  Neurological:     Mental Status: She is alert and oriented to person, place, and time.     UC Treatments / Results  Labs (all labs ordered are listed, but only abnormal results are displayed) Labs Reviewed  POCT URINALYSIS DIP (MANUAL ENTRY) - Abnormal; Notable for the following components:      Result Value   Color, UA straw (*)    Clarity, UA cloudy (*)    Glucose, UA >=1,000 (*)    Bilirubin, UA small (*)    Ketones, POC UA >= (160) (*)    Blood, UA large (*)    Protein Ur, POC >=300 (*)    All other components within normal limits  POCT FASTING CBG KUC MANUAL ENTRY - Abnormal; Notable for the following components:   POCT Glucose (KUC) 519 (*)    All other components within normal limits    EKG  Radiology No results found.  Procedures Procedures   Medications Ordered in UC Medications  ondansetron (ZOFRAN) injection 4 mg (4 mg Intramuscular Given 10/14/22 1121)  ketorolac (TORADOL) 30 MG/ML injection 30 mg (30 mg Intramuscular Given 10/14/22 1121)    Initial Impression / Assessment and Plan / UC Course  I have reviewed the triage vital signs and the nursing notes.  Pertinent labs & imaging results that were available during my care of the patient were reviewed by me and considered in my medical decision making (see chart for  details).  Patient is tachycardic, hypertensive, tachypneic Reports she feels anxious but denies shortness of breath Afebrile  IM zofran and toradol given for nausea, pain  CBG 519 UA with >160 ketones  Concern for metabolic emergency.  Patient ill-appearing and requires higher level of care, advanced imaging and work up not available in the urgent care.  Discussed with patient and mom who is bedside  20g IV placed left forearm by Selena Batten RN  [facility] called for transport to hospital. Reports no available truck for 45 minutes  Final Clinical Impressions(s) / UC Diagnoses   Final diagnoses:  Hyperglycemia  Generalized abdominal pain     Discharge Instructions      Sent to ED via [facility]     ED Prescriptions   None    PDMP not reviewed this encounter.   Sheridan Hew, Ray Church 10/14/22 1208

## 2022-10-14 NOTE — ED Triage Notes (Signed)
Pt present to ED from urgent care with c/o abdominal pain, cramping in nature, n/v/d times three days. Pt blood sugar noted greater than 500 at urgent care. Ketones noted in urine per urgent care. Pt A&Ox4 at this time. Pt continues to c/o abdominal pain.

## 2022-10-15 DIAGNOSIS — E1110 Type 2 diabetes mellitus with ketoacidosis without coma: Principal | ICD-10-CM

## 2022-10-15 LAB — GLUCOSE, CAPILLARY
Glucose-Capillary: 174 mg/dL — ABNORMAL HIGH (ref 70–99)
Glucose-Capillary: 177 mg/dL — ABNORMAL HIGH (ref 70–99)
Glucose-Capillary: 178 mg/dL — ABNORMAL HIGH (ref 70–99)
Glucose-Capillary: 183 mg/dL — ABNORMAL HIGH (ref 70–99)
Glucose-Capillary: 196 mg/dL — ABNORMAL HIGH (ref 70–99)
Glucose-Capillary: 196 mg/dL — ABNORMAL HIGH (ref 70–99)
Glucose-Capillary: 211 mg/dL — ABNORMAL HIGH (ref 70–99)
Glucose-Capillary: 219 mg/dL — ABNORMAL HIGH (ref 70–99)
Glucose-Capillary: 230 mg/dL — ABNORMAL HIGH (ref 70–99)

## 2022-10-15 LAB — CBC WITH DIFFERENTIAL/PLATELET
Abs Immature Granulocytes: 0.07 10*3/uL (ref 0.00–0.07)
Basophils Absolute: 0 10*3/uL (ref 0.0–0.1)
Basophils Relative: 0 %
Eosinophils Absolute: 0.2 10*3/uL (ref 0.0–0.5)
Eosinophils Relative: 1 %
HCT: 38.9 % (ref 36.0–46.0)
Hemoglobin: 13.4 g/dL (ref 12.0–15.0)
Immature Granulocytes: 0 %
Lymphocytes Relative: 29 %
Lymphs Abs: 4.7 10*3/uL — ABNORMAL HIGH (ref 0.7–4.0)
MCH: 29.2 pg (ref 26.0–34.0)
MCHC: 34.4 g/dL (ref 30.0–36.0)
MCV: 84.7 fL (ref 80.0–100.0)
Monocytes Absolute: 1.7 10*3/uL — ABNORMAL HIGH (ref 0.1–1.0)
Monocytes Relative: 11 %
Neutro Abs: 9.6 10*3/uL — ABNORMAL HIGH (ref 1.7–7.7)
Neutrophils Relative %: 59 %
Platelets: 220 10*3/uL (ref 150–400)
RBC: 4.59 MIL/uL (ref 3.87–5.11)
RDW: 14.1 % (ref 11.5–15.5)
WBC: 16.3 10*3/uL — ABNORMAL HIGH (ref 4.0–10.5)
nRBC: 0 % (ref 0.0–0.2)

## 2022-10-15 LAB — BASIC METABOLIC PANEL
Anion gap: 8 (ref 5–15)
Anion gap: 9 (ref 5–15)
BUN: 9 mg/dL (ref 6–20)
BUN: 9 mg/dL (ref 6–20)
CO2: 21 mmol/L — ABNORMAL LOW (ref 22–32)
CO2: 25 mmol/L (ref 22–32)
Calcium: 8.5 mg/dL — ABNORMAL LOW (ref 8.9–10.3)
Calcium: 8.7 mg/dL — ABNORMAL LOW (ref 8.9–10.3)
Chloride: 102 mmol/L (ref 98–111)
Chloride: 103 mmol/L (ref 98–111)
Creatinine, Ser: 0.88 mg/dL (ref 0.44–1.00)
Creatinine, Ser: 1.1 mg/dL — ABNORMAL HIGH (ref 0.44–1.00)
GFR, Estimated: 59 mL/min — ABNORMAL LOW (ref >60)
GFR, Estimated: >60 mL/min (ref >60)
Glucose, Bld: 203 mg/dL — ABNORMAL HIGH (ref 70–99)
Glucose, Bld: 256 mg/dL — ABNORMAL HIGH (ref 70–99)
Potassium: 3.1 mmol/L — ABNORMAL LOW (ref 3.5–5.1)
Potassium: 3.2 mmol/L — ABNORMAL LOW (ref 3.5–5.1)
Sodium: 132 mmol/L — ABNORMAL LOW (ref 135–145)
Sodium: 136 mmol/L (ref 135–145)

## 2022-10-15 LAB — COMPREHENSIVE METABOLIC PANEL
ALT: 43 U/L (ref 0–44)
AST: 95 U/L — ABNORMAL HIGH (ref 15–41)
Albumin: 3.4 g/dL — ABNORMAL LOW (ref 3.5–5.0)
Alkaline Phosphatase: 58 U/L (ref 38–126)
Anion gap: 10 (ref 5–15)
BUN: 9 mg/dL (ref 6–20)
CO2: 21 mmol/L — ABNORMAL LOW (ref 22–32)
Calcium: 8.7 mg/dL — ABNORMAL LOW (ref 8.9–10.3)
Chloride: 102 mmol/L (ref 98–111)
Creatinine, Ser: 0.9 mg/dL (ref 0.44–1.00)
GFR, Estimated: >60 mL/min (ref >60)
Glucose, Bld: 202 mg/dL — ABNORMAL HIGH (ref 70–99)
Potassium: 3.5 mmol/L (ref 3.5–5.1)
Sodium: 133 mmol/L — ABNORMAL LOW (ref 135–145)
Total Bilirubin: 0.5 mg/dL (ref 0.3–1.2)
Total Protein: 6.9 g/dL (ref 6.5–8.1)

## 2022-10-15 LAB — BETA-HYDROXYBUTYRIC ACID
Beta-Hydroxybutyric Acid: 0.12 mmol/L (ref 0.05–0.27)
Beta-Hydroxybutyric Acid: 0.33 mmol/L — ABNORMAL HIGH (ref 0.05–0.27)

## 2022-10-15 MED ORDER — INSULIN ASPART 100 UNIT/ML IJ SOLN
0.0000 [IU] | Freq: Three times a day (TID) | INTRAMUSCULAR | Status: DC
  Administered 2022-10-15: 3 [IU] via SUBCUTANEOUS

## 2022-10-15 MED ORDER — INSULIN ASPART 100 UNIT/ML IJ SOLN: 0.0000 [IU] | Freq: Every day | INTRAMUSCULAR | Status: DC

## 2022-10-15 MED ORDER — INSULIN GLARGINE-YFGN 100 UNIT/ML ~~LOC~~ SOLN
20.0000 [IU] | Freq: Every day | SUBCUTANEOUS | Status: DC
  Administered 2022-10-15: 20 [IU] via SUBCUTANEOUS
  Filled 2022-10-15: qty 0.2

## 2022-10-15 NOTE — TOC CM/SW Note (Signed)
Transition of Care [facility]) - Inpatient Brief Assessment   Patient Details  Name: Catherine Pope MRN: [id number] Date of Birth: 01-26-1966  Transition [facility]) CM/SW Contact:    Darrold Span, RN Phone Number: 10/15/2022, 1:33 PM   Clinical Narrative: Pt stable for transition home today, CM acknowledges referral for PCP/Med needs- pt has PCP to follow up with and DM educator has seen pt regarding medication needs- No further TOC needs noted. Pt to follow up with her PCP.     Transition of Care Asessment: Insurance and Status: Insurance coverage has been reviewed (Has medication coverage) Patient has primary care physician: Yes Home environment has been reviewed: home Prior level of function:: self Prior/Current Home Services: No current home services Social Determinants of Health Reivew: SDOH reviewed no interventions necessary Readmission risk has been reviewed: Yes Transition of care needs: no transition of care needs at this time

## 2022-10-15 NOTE — Discharge Summary (Signed)
Physician Discharge Summary  Catherine Pope ZOX:[id number] DOB: 1965/04/05 DOA: 10/14/2022  PCP: Courtney Paris, NP  Admit date: 10/14/2022 Discharge date: 10/15/2022    Admitted From: Home Disposition: Home  Recommendations for Outpatient Follow-up:  Follow up with PCP in 1-2 weeks Please obtain BMP/CBC in one week Please follow up with your PCP on the following pending results: Unresulted Labs (From admission, onward)    None         Home Health: None Equipment/Devices: None  Discharge Condition: Stable CODE STATUS: Full code Diet recommendation: Diabetic  Following HPI is copied from my admitting hospitalist colleagues H&P. HPI: Catherine Pope is a 57 y.o. female with medical history significant of depression/anxiety, DM, and HTN presenting with abdominal pain, n/v/d.   She reports that she started about 3 days ago with abdominal pain and n/v/d.  She thought that it was related to gastroparesis and so tried to ignore it but symptoms were persistent and worsening.  She has remote h/o use of insulin but none in maybe 5 years since she was changed to Ozempic.  She began having difficulty obtaining it and so was changed to [facility] about 3 months ago.  At that time, her A1c was in the mid-7 range.  However, she has not had any access to [facility] since her pharmacy couldn't provide it for the last 3 months.  She finally obtained it last Friday and gave herself her first injection.   Subjective: Seen and examined, daughter at the bedside, patient feels well with no complaints.  Brief/Interim Summary: Patient was hospitalized briefly for mild DKA, she was treated per protocol and started on IV fluids and insulin, anion gap closed, DKA resolved, transitioned to long-acting insulin and sliding scale insulin.  Hemoglobin A1c 11.3.  Patient sees physician at [facility] and tells me that her hemoglobin A1c 3 months ago was only 7.2.  She was on Ozempic until 3 months ago  but she has not taken any medications and has been started on Mounjaro just about a week ago.  Patient noncompliant with checking her blood sugar as well.  Not sure if Greggory Keen will be enough to control her sugars but she claims that she was very well-controlled on Ozempic.  I will defer to PCP to manage this as outpatient.  She was however highly encouraged to check blood sugar at least 2-3 times a day and make a log and bring that to PCPs visit next week.  She verbalized understanding.  She is being discharged in stable condition.  Discharge plan was discussed with patient and/or family member and they verbalized understanding and agreed with it.  Discharge Diagnoses:  Principal Problem:   DKA (diabetic ketoacidosis) (HCC) Active Problems:   Dyslipidemia   Essential hypertension   Low back pain   Depression    Discharge Instructions   Allergies as of 10/15/2022   No Known Allergies      Medication List     TAKE these medications    buPROPion 300 MG 24 hr tablet Commonly known as: WELLBUTRIN XL Take 300 mg by mouth daily.   cetirizine 10 MG tablet Commonly known as: ZYRTEC Take 10 mg by mouth at bedtime.   Emgality 120 MG/ML Soaj Generic drug: Galcanezumab-gnlm   hydrochlorothiazide 25 MG tablet Commonly known as: HYDRODIURIL Take 1 tablet (25 mg total) by mouth daily.   lisinopril 20 MG tablet Commonly known as: ZESTRIL Take 1 tablet (20 mg total) by mouth daily.   lovastatin 20  MG tablet Commonly known as: MEVACOR TAKE 1 TABLET (20 MG TOTAL) BY MOUTH AT BEDTIME.   metoCLOPramide 5 MG tablet Commonly known as: REGLAN Take by mouth.   Mounjaro 15 MG/0.5ML Pen Generic drug: tirzepatide Inject 15 mg into the skin once a week. Injection on Fridays   ondansetron 4 MG disintegrating tablet Commonly known as: Zofran ODT Take 1 tablet (4 mg total) by mouth every 8 (eight) hours as needed for up to 12 doses for nausea or vomiting.   onetouch ultrasoft  lancets Use as instructed   Oxycodone HCl 10 MG Tabs Take 10 mg by mouth in the morning, at noon, and at bedtime.   TIZANIDINE HCL PO Take 1 tablet by mouth in the morning, at noon, and at bedtime.   traZODone 100 MG tablet Commonly known as: DESYREL Take 1 tablet (100 mg total) by mouth at bedtime as needed for sleep.        Follow-up Information     Courtney Paris, NP Follow up in 1 week(s).   Specialty: Nurse Practitioner Contact information: [address] [place] [address] [phone]                No Known Allergies  Consultations: None   Procedures/Studies: CT ABDOMEN PELVIS W CONTRAST  Result Date: 10/14/2022 CLINICAL DATA:  Right lower quadrant abdominal pain. EXAM: CT ABDOMEN AND PELVIS WITH CONTRAST TECHNIQUE: Multidetector CT imaging of the abdomen and pelvis was performed using the standard protocol following bolus administration of intravenous contrast. RADIATION DOSE REDUCTION: This exam was performed according to the departmental dose-optimization program which includes automated exposure control, adjustment of the mA and/or kV according to patient size and/or use of iterative reconstruction technique. CONTRAST:  75mL OMNIPAQUE IOHEXOL 350 MG/ML SOLN COMPARISON:  December 15, 2020 FINDINGS: Lower chest: Left lower lobe solitary cyst. Hepatobiliary: Hepatic steatosis. Normal appearance of the gallbladder. Pancreas: Unremarkable. No pancreatic ductal dilatation or surrounding inflammatory changes. Spleen: Normal in size without focal abnormality. Adrenals/Urinary Tract: Adrenal glands are unremarkable. Kidneys are normal, without renal calculi, focal lesion, or hydronephrosis. Bladder is unremarkable. Stomach/Bowel: Stomach is within normal limits. Appendix appears normal. No evidence of bowel wall thickening, distention, or inflammatory changes. Mild left colonic diverticulosis. Vascular/Lymphatic: Aortic atherosclerosis. No enlarged abdominal or pelvic  lymph nodes. Reproductive: Status post hysterectomy. No adnexal masses. Other: No abdominal wall hernia or abnormality. No abdominopelvic ascites. Musculoskeletal: No acute or significant osseous findings. IMPRESSION: 1. No acute abnormality identified within the abdomen or pelvis. 2. Hepatic steatosis. 3. Mild left colonic diverticulosis without evidence of diverticulitis. 4. Aortic atherosclerosis. Aortic Atherosclerosis (ICD10-I70.0). Electronically Signed   By: Ted Mcalpine M.D.   On: 10/14/2022 14:53     Discharge Exam: Vitals:   10/15/22 0417 10/15/22 0835  BP: (!) 97/51 126/75  Pulse: 87   Resp: 20   Temp: 97.9 F (36.6 C) 97.8 F (36.6 C)  SpO2: 96%    Vitals:   10/14/22 2251 10/15/22 0222 10/15/22 0417 10/15/22 0835  BP: 90/61 (!) 108/57 (!) 97/51 126/75  Pulse: 95  87   Resp: 20 20 20    Temp: 98.9 F (37.2 C)  97.9 F (36.6 C) 97.8 F (36.6 C)  TempSrc: Axillary  Axillary Oral  SpO2: 97% 99% 96%   Weight:      Height:        General: Pt is alert, awake, not in acute distress Cardiovascular: RRR, S1/S2 +, no rubs, no gallops Respiratory: CTA bilaterally, no wheezing, no rhonchi  Abdominal: Soft, NT, ND, bowel sounds + Extremities: no edema, no cyanosis    The results of significant diagnostics from this hospitalization (including imaging, microbiology, ancillary and laboratory) are listed below for reference.     Microbiology: No results found for this or any previous visit (from the past 240 hour(s)).   Labs: BNP (last 3 results) No results for input(s): "BNP" in the last 8760 hours. Basic Metabolic Panel: Recent Labs  Lab 10/14/22 1235 10/14/22 1303 10/14/22 1923 10/14/22 2351 10/15/22 0236 10/15/22 0855  NA 131* 132* 133*  132* 132* 136 133*  K 4.2 4.4 3.5  3.5 3.2* 3.1* 3.5  CL 98  --  100  99 103 102 102  CO2 13*  --  20*  19* 21* 25 21*  GLUCOSE 554*  --  263*  266* 256* 203* 202*  BUN 12  --  8  9 9 9 9   CREATININE 1.31*  --   0.92  0.92 0.88 1.10* 0.90  CALCIUM 9.5  --  9.0  8.9 8.5* 8.7* 8.7*   Liver Function Tests: Recent Labs  Lab 10/15/22 0855  AST 95*  ALT 43  ALKPHOS 58  BILITOT 0.5  PROT 6.9  ALBUMIN 3.4*   No results for input(s): "LIPASE", "AMYLASE" in the last 168 hours. No results for input(s): "AMMONIA" in the last 168 hours. CBC: Recent Labs  Lab 10/14/22 1235 10/14/22 1303 10/15/22 0831  WBC 20.8*  --  16.3*  NEUTROABS 15.4*  --  9.6*  HGB 14.4 15.6* 13.4  HCT 42.5 46.0 38.9  MCV 85.7  --  84.7  PLT 299  --  220   Cardiac Enzymes: No results for input(s): "CKTOTAL", "CKMB", "CKMBINDEX", "TROPONINI" in the last 168 hours. BNP: Invalid input(s): "POCBNP" CBG: Recent Labs  Lab 10/15/22 0311 10/15/22 0418 10/15/22 0534 10/15/22 0649 10/15/22 0756  GLUCAP 177* 183* 196* 196* 174*   D-Dimer No results for input(s): "DDIMER" in the last 72 hours. Hgb A1c Recent Labs    10/14/22 1923  HGBA1C 11.8*   Lipid Profile No results for input(s): "CHOL", "HDL", "LDLCALC", "TRIG", "CHOLHDL", "LDLDIRECT" in the last 72 hours. Thyroid function studies No results for input(s): "TSH", "T4TOTAL", "T3FREE", "THYROIDAB" in the last 72 hours.  Invalid input(s): "FREET3" Anemia work up No results for input(s): "VITAMINB12", "FOLATE", "FERRITIN", "TIBC", "IRON", "RETICCTPCT" in the last 72 hours. Urinalysis    Component Value Date/Time   COLORURINE YELLOW 12/15/2020 1719   APPEARANCEUR CLOUDY (A) 12/15/2020 1719   LABSPEC 1.030 12/15/2020 1719   PHURINE 6.5 12/15/2020 1719   GLUCOSEU NEGATIVE 12/15/2020 1719   HGBUR LARGE (A) 12/15/2020 1719   BILIRUBINUR small (A) 10/14/2022 1132   BILIRUBINUR neg 07/19/2017 1116   KETONESUR >= (160) (A) 10/14/2022 1132   KETONESUR 80 (A) 12/15/2020 1719   PROTEINUR >=300 (A) 10/14/2022 1132   PROTEINUR 100 (A) 12/15/2020 1719   UROBILINOGEN 0.2 10/14/2022 1132   UROBILINOGEN 0.2 06/18/2017 1144   NITRITE Negative 10/14/2022 1132   NITRITE  NEGATIVE 12/15/2020 1719   LEUKOCYTESUR Negative 10/14/2022 1132   LEUKOCYTESUR NEGATIVE 12/15/2020 1719   Sepsis Labs Recent Labs  Lab 10/14/22 1235 10/15/22 0831  WBC 20.8* 16.3*   Microbiology No results found for this or any previous visit (from the past 240 hour(s)).  FURTHER DISCHARGE INSTRUCTIONS:   Get Medicines reviewed and adjusted: Please take all your medications with you for your next visit with your Primary MD   Laboratory/radiological data: Please request your Primary MD  to go over all hospital tests and procedure/radiological results at the follow up, please ask your Primary MD to get all Hospital records sent to his/her office.   In some cases, they will be blood work, cultures and biopsy results pending at the time of your discharge. Please request that your primary care M.D. goes through all the records of your hospital data and follows up on these results.   Also Note the following: If you experience worsening of your admission symptoms, develop shortness of breath, life threatening emergency, suicidal or homicidal thoughts you must seek medical attention immediately by calling 911 or calling your MD immediately  if symptoms less severe.   You must read complete instructions/literature along with all the possible adverse reactions/side effects for all the Medicines you take and that have been prescribed to you. Take any new Medicines after you have completely understood and accpet all the possible adverse reactions/side effects.    Do not drive when taking Pain medications or sleeping medications (Benzodaizepines)   Do not take more than prescribed Pain, Sleep and Anxiety Medications. It is not advisable to combine anxiety,sleep and pain medications without talking with your primary care practitioner   Special Instructions: If you have smoked or chewed Tobacco  in the last 2 yrs please stop smoking, stop any regular Alcohol  and or any Recreational drug use.    Wear Seat belts while driving.   Please note: You were cared for by a hospitalist during your hospital stay. Once you are discharged, your primary care physician will handle any further medical issues. Please note that NO REFILLS for any discharge medications will be authorized once you are discharged, as it is imperative that you return to your primary care physician (or establish a relationship with a primary care physician if you do not have one) for your post hospital discharge needs so that they can reassess your need for medications and monitor your lab values  Time coordinating discharge: Over 30 minutes  SIGNED:   Hughie Closs, MD  [facility] 10/15/2022, 10:33 AM *Please note that this is a verbal dictation therefore any spelling or grammatical errors are due to the "Dragon Medical One" system interpretation. If 7PM-7AM, please contact night-coverage [email or web address]

## 2022-10-15 NOTE — Inpatient Diabetes Management (Signed)
Inpatient Diabetes Program Recommendations  AACE/ADA: New Consensus Statement on Inpatient Glycemic Control (2015)  Target Ranges:  Prepandial:   less than 140 mg/dL      Peak postprandial:   less than 180 mg/dL (1-2 hours)      Critically ill patients:  140 - 180 mg/dL   Lab Results  Component Value Date   GLUCAP 174 (H) 10/15/2022   HGBA1C 11.8 (H) 10/14/2022    Review of Glycemic Control  Diabetes history: DM 2 Outpatient Diabetes medications: Mounjaro Weekly Current orders for Inpatient glycemic control:  Semglee 20 units Daily Novolog 0-15 units tid + hs  Note: pt switched from Ozempic to mounjaro 3 months ago and was unable to get it (we have seen rebound effects of these medications when unable to obtain medication with rebound weight gain and worsening hyperglycemia/A1c levels). Greggory Keen has a lot of GI side effects when initiated and with subsequent dose increases. It also can slow gastric emptying which pt may need to consult further with her PCP, who prescribed the medication, to see if she will continue to be appropriate for this medication with her history of gastroparesis.  Discussed with pt over the phone her current A1c level, did discuss the possible need for insulin at time of d/c. Pt has used the insulin pens in the past. Did discuss if the continued with the mounjaro as she looses weight will need to titrate insulin doses with the help of her PCP.  Thanks,  Christena Deem RN, MSN, BC-ADM Inpatient Diabetes Coordinator Team Pager [phone] (8a-5p)

## 2022-10-21 ENCOUNTER — Emergency Department (HOSPITAL_COMMUNITY): Payer: MEDICAID

## 2022-10-21 ENCOUNTER — Inpatient Hospital Stay (HOSPITAL_COMMUNITY)
Admission: EM | Admit: 2022-10-21 | Discharge: 2022-10-26 | DRG: 074 | Disposition: A | Discharge status: Home Health | Payer: MEDICAID | Attending: Internal Medicine | Admitting: Internal Medicine

## 2022-10-21 ENCOUNTER — Other Ambulatory Visit: Payer: Self-pay

## 2022-10-21 DIAGNOSIS — E876 Hypokalemia: Secondary | ICD-10-CM | POA: Diagnosis present

## 2022-10-21 DIAGNOSIS — E669 Obesity, unspecified: Secondary | ICD-10-CM | POA: Diagnosis not present

## 2022-10-21 DIAGNOSIS — Z981 Arthrodesis status: Secondary | ICD-10-CM

## 2022-10-21 DIAGNOSIS — I10 Essential (primary) hypertension: Secondary | ICD-10-CM | POA: Diagnosis not present

## 2022-10-21 DIAGNOSIS — Z90710 Acquired absence of both cervix and uterus: Secondary | ICD-10-CM

## 2022-10-21 DIAGNOSIS — R112 Nausea with vomiting, unspecified: Secondary | ICD-10-CM | POA: Diagnosis not present

## 2022-10-21 DIAGNOSIS — Z79899 Other long term (current) drug therapy: Secondary | ICD-10-CM

## 2022-10-21 DIAGNOSIS — Z7984 Long term (current) use of oral hypoglycemic drugs: Secondary | ICD-10-CM | POA: Diagnosis not present

## 2022-10-21 DIAGNOSIS — E871 Hypo-osmolality and hyponatremia: Secondary | ICD-10-CM | POA: Diagnosis present

## 2022-10-21 DIAGNOSIS — I160 Hypertensive urgency: Secondary | ICD-10-CM | POA: Diagnosis not present

## 2022-10-21 DIAGNOSIS — Z7985 Long-term (current) use of injectable non-insulin antidiabetic drugs: Secondary | ICD-10-CM

## 2022-10-21 DIAGNOSIS — E872 Acidosis, unspecified: Secondary | ICD-10-CM | POA: Diagnosis not present

## 2022-10-21 DIAGNOSIS — M479 Spondylosis, unspecified: Secondary | ICD-10-CM | POA: Diagnosis present

## 2022-10-21 DIAGNOSIS — D72829 Elevated white blood cell count, unspecified: Secondary | ICD-10-CM | POA: Diagnosis present

## 2022-10-21 DIAGNOSIS — K3184 Gastroparesis: Secondary | ICD-10-CM | POA: Diagnosis not present

## 2022-10-21 DIAGNOSIS — Z8249 Family history of ischemic heart disease and other diseases of the circulatory system: Secondary | ICD-10-CM | POA: Diagnosis not present

## 2022-10-21 DIAGNOSIS — Z87891 Personal history of nicotine dependence: Secondary | ICD-10-CM

## 2022-10-21 DIAGNOSIS — E1165 Type 2 diabetes mellitus with hyperglycemia: Secondary | ICD-10-CM | POA: Insufficient documentation

## 2022-10-21 DIAGNOSIS — G43909 Migraine, unspecified, not intractable, without status migrainosus: Secondary | ICD-10-CM | POA: Diagnosis present

## 2022-10-21 DIAGNOSIS — E1143 Type 2 diabetes mellitus with diabetic autonomic (poly)neuropathy: Principal | ICD-10-CM | POA: Diagnosis present

## 2022-10-21 DIAGNOSIS — Z833 Family history of diabetes mellitus: Secondary | ICD-10-CM | POA: Diagnosis not present

## 2022-10-21 DIAGNOSIS — G8929 Other chronic pain: Secondary | ICD-10-CM | POA: Diagnosis present

## 2022-10-21 DIAGNOSIS — R9431 Abnormal electrocardiogram [ECG] [EKG]: Secondary | ICD-10-CM | POA: Diagnosis present

## 2022-10-21 DIAGNOSIS — Z6833 Body mass index (BMI) 33.0-33.9, adult: Secondary | ICD-10-CM

## 2022-10-21 DIAGNOSIS — Z841 Family history of disorders of kidney and ureter: Secondary | ICD-10-CM

## 2022-10-21 DIAGNOSIS — Z823 Family history of stroke: Secondary | ICD-10-CM

## 2022-10-21 DIAGNOSIS — F32A Depression, unspecified: Secondary | ICD-10-CM | POA: Diagnosis not present

## 2022-10-21 LAB — CBC WITH DIFFERENTIAL/PLATELET
Abs Immature Granulocytes: 0.08 10*3/uL — ABNORMAL HIGH (ref 0.00–0.07)
Basophils Absolute: 0.1 10*3/uL (ref 0.0–0.1)
Basophils Relative: 1 %
Eosinophils Absolute: 0 10*3/uL (ref 0.0–0.5)
Eosinophils Relative: 0 %
HCT: 42.6 % (ref 36.0–46.0)
Hemoglobin: 14.4 g/dL (ref 12.0–15.0)
Immature Granulocytes: 1 %
Lymphocytes Relative: 14 %
Lymphs Abs: 2.1 10*3/uL (ref 0.7–4.0)
MCH: 28.9 pg (ref 26.0–34.0)
MCHC: 33.8 g/dL (ref 30.0–36.0)
MCV: 85.5 fL (ref 80.0–100.0)
Monocytes Absolute: 0.8 10*3/uL (ref 0.1–1.0)
Monocytes Relative: 5 %
Neutro Abs: 12.4 10*3/uL — ABNORMAL HIGH (ref 1.7–7.7)
Neutrophils Relative %: 79 %
Platelets: 299 10*3/uL (ref 150–400)
RBC: 4.98 MIL/uL (ref 3.87–5.11)
RDW: 14 % (ref 11.5–15.5)
WBC: 15.4 10*3/uL — ABNORMAL HIGH (ref 4.0–10.5)
nRBC: 0 % (ref 0.0–0.2)

## 2022-10-21 LAB — CBC
HCT: 47.8 % — ABNORMAL HIGH (ref 36.0–46.0)
Hemoglobin: 15.6 g/dL — ABNORMAL HIGH (ref 12.0–15.0)
MCH: 28.8 pg (ref 26.0–34.0)
MCHC: 32.6 g/dL (ref 30.0–36.0)
MCV: 88.4 fL (ref 80.0–100.0)
Platelets: 297 10*3/uL (ref 150–400)
RBC: 5.41 MIL/uL — ABNORMAL HIGH (ref 3.87–5.11)
RDW: 14.2 % (ref 11.5–15.5)
WBC: 19.4 10*3/uL — ABNORMAL HIGH (ref 4.0–10.5)
nRBC: 0 % (ref 0.0–0.2)

## 2022-10-21 LAB — URINALYSIS, ROUTINE W REFLEX MICROSCOPIC
Bilirubin Urine: NEGATIVE
Glucose, UA: 150 mg/dL — AB
Hgb urine dipstick: NEGATIVE
Ketones, ur: 20 mg/dL — AB
Leukocytes,Ua: NEGATIVE
Nitrite: NEGATIVE
Protein, ur: 100 mg/dL — AB
Specific Gravity, Urine: 1.01 (ref 1.005–1.030)
pH: 7 (ref 5.0–8.0)

## 2022-10-21 LAB — COMPREHENSIVE METABOLIC PANEL
ALT: 27 U/L (ref 0–44)
AST: 28 U/L (ref 15–41)
Albumin: 4.4 g/dL (ref 3.5–5.0)
Alkaline Phosphatase: 63 U/L (ref 38–126)
Anion gap: 13 (ref 5–15)
BUN: <5 mg/dL — ABNORMAL LOW (ref 6–20)
CO2: 21 mmol/L — ABNORMAL LOW (ref 22–32)
Calcium: 8.7 mg/dL — ABNORMAL LOW (ref 8.9–10.3)
Chloride: 99 mmol/L (ref 98–111)
Creatinine, Ser: 0.61 mg/dL (ref 0.44–1.00)
GFR, Estimated: >60 mL/min (ref >60)
Glucose, Bld: 225 mg/dL — ABNORMAL HIGH (ref 70–99)
Potassium: 3.2 mmol/L — ABNORMAL LOW (ref 3.5–5.1)
Sodium: 133 mmol/L — ABNORMAL LOW (ref 135–145)
Total Bilirubin: 0.5 mg/dL (ref 0.3–1.2)
Total Protein: 8.8 g/dL — ABNORMAL HIGH (ref 6.5–8.1)

## 2022-10-21 LAB — GLUCOSE, CAPILLARY
Glucose-Capillary: 225 mg/dL — ABNORMAL HIGH (ref 70–99)
Glucose-Capillary: 272 mg/dL — ABNORMAL HIGH (ref 70–99)

## 2022-10-21 LAB — PHOSPHORUS: Phosphorus: 2.5 mg/dL (ref 2.5–4.6)

## 2022-10-21 LAB — CREATININE, SERUM
Creatinine, Ser: 0.66 mg/dL (ref 0.44–1.00)
GFR, Estimated: >60 mL/min (ref >60)

## 2022-10-21 LAB — BLOOD GAS, VENOUS
Acid-Base Excess: 2.2 mmol/L — ABNORMAL HIGH (ref 0.0–2.0)
Bicarbonate: 22.1 mmol/L (ref 20.0–28.0)
O2 Saturation: 100 %
Patient temperature: 37
pCO2, Ven: 23 mmHg — ABNORMAL LOW (ref 44–60)
pH, Ven: 7.59 — ABNORMAL HIGH (ref 7.25–7.43)
pO2, Ven: 162 mmHg — ABNORMAL HIGH (ref 32–45)

## 2022-10-21 LAB — MAGNESIUM: Magnesium: 1.3 mg/dL — ABNORMAL LOW (ref 1.7–2.4)

## 2022-10-21 LAB — LIPASE, BLOOD: Lipase: 31 U/L (ref 11–51)

## 2022-10-21 MED ORDER — KETOROLAC TROMETHAMINE 15 MG/ML IJ SOLN
15.0000 mg | Freq: Four times a day (QID) | INTRAMUSCULAR | Status: DC | PRN
  Administered 2022-10-21 – 2022-10-26 (×5): 15 mg via INTRAVENOUS
  Filled 2022-10-21 (×5): qty 1

## 2022-10-21 MED ORDER — HYDROMORPHONE HCL 1 MG/ML IJ SOLN
1.0000 mg | Freq: Once | INTRAMUSCULAR | Status: AC
  Administered 2022-10-21: 1 mg via INTRAVENOUS
  Filled 2022-10-21: qty 1

## 2022-10-21 MED ORDER — FAMOTIDINE IN NACL 20-0.9 MG/50ML-% IV SOLN
20.0000 mg | Freq: Two times a day (BID) | INTRAVENOUS | Status: DC
  Administered 2022-10-21 – 2022-10-26 (×10): 20 mg via INTRAVENOUS
  Filled 2022-10-21 (×10): qty 50

## 2022-10-21 MED ORDER — SODIUM CHLORIDE 0.45 % IV SOLN: INTRAVENOUS | Status: DC

## 2022-10-21 MED ORDER — OXYCODONE HCL 5 MG PO TABS
10.0000 mg | ORAL_TABLET | Freq: Three times a day (TID) | ORAL | Status: DC | PRN
  Administered 2022-10-21 – 2022-10-26 (×8): 10 mg via ORAL
  Filled 2022-10-21 (×8): qty 2

## 2022-10-21 MED ORDER — HEPARIN SODIUM (PORCINE) 5000 UNIT/ML IJ SOLN
5000.0000 [IU] | Freq: Three times a day (TID) | INTRAMUSCULAR | Status: DC
  Administered 2022-10-21 – 2022-10-26 (×13): 5000 [IU] via SUBCUTANEOUS
  Filled 2022-10-21 (×14): qty 1

## 2022-10-21 MED ORDER — BUPROPION HCL ER (XL) 150 MG PO TB24
300.0000 mg | ORAL_TABLET | Freq: Every day | ORAL | Status: DC
  Administered 2022-10-21 – 2022-10-26 (×6): 300 mg via ORAL
  Filled 2022-10-21 (×6): qty 2

## 2022-10-21 MED ORDER — HYDRALAZINE HCL 20 MG/ML IJ SOLN
10.0000 mg | Freq: Four times a day (QID) | INTRAMUSCULAR | Status: DC | PRN
  Administered 2022-10-21 – 2022-10-22 (×3): 10 mg via INTRAVENOUS
  Filled 2022-10-21 (×3): qty 1

## 2022-10-21 MED ORDER — INSULIN ASPART 100 UNIT/ML IJ SOLN
0.0000 [IU] | Freq: Three times a day (TID) | INTRAMUSCULAR | Status: DC
  Administered 2022-10-21 – 2022-10-22 (×2): 3 [IU] via SUBCUTANEOUS
  Administered 2022-10-22: 2 [IU] via SUBCUTANEOUS

## 2022-10-21 MED ORDER — METOCLOPRAMIDE HCL 5 MG/ML IJ SOLN
10.0000 mg | Freq: Once | INTRAMUSCULAR | Status: AC
  Administered 2022-10-21: 10 mg via INTRAVENOUS
  Filled 2022-10-21: qty 2

## 2022-10-21 MED ORDER — HYDROCHLOROTHIAZIDE 12.5 MG PO TABS
25.0000 mg | ORAL_TABLET | Freq: Every day | ORAL | Status: DC
  Administered 2022-10-21 – 2022-10-26 (×6): 25 mg via ORAL
  Filled 2022-10-21 (×6): qty 2

## 2022-10-21 MED ORDER — LACTATED RINGERS IV SOLN: INTRAVENOUS | Status: DC

## 2022-10-21 MED ORDER — OXYCODONE HCL 10 MG PO TABS: 10.0000 mg | ORAL_TABLET | Freq: Three times a day (TID) | ORAL | Status: DC | PRN

## 2022-10-21 MED ORDER — DIPHENHYDRAMINE HCL 50 MG/ML IJ SOLN
12.5000 mg | Freq: Once | INTRAMUSCULAR | Status: AC
  Administered 2022-10-21: 12.5 mg via INTRAVENOUS
  Filled 2022-10-21: qty 1

## 2022-10-21 MED ORDER — HYDRALAZINE HCL 20 MG/ML IJ SOLN
INTRAMUSCULAR | Status: AC
  Filled 2022-10-21: qty 1

## 2022-10-21 MED ORDER — ALBUTEROL SULFATE (2.5 MG/3ML) 0.083% IN NEBU: 2.5000 mg | INHALATION_SOLUTION | RESPIRATORY_TRACT | Status: DC | PRN

## 2022-10-21 MED ORDER — HYDROMORPHONE HCL 1 MG/ML IJ SOLN
0.5000 mg | Freq: Once | INTRAMUSCULAR | Status: AC | PRN
  Administered 2022-10-21: 0.5 mg via INTRAVENOUS
  Filled 2022-10-21: qty 0.5

## 2022-10-21 MED ORDER — LISINOPRIL 10 MG PO TABS
20.0000 mg | ORAL_TABLET | Freq: Every day | ORAL | Status: DC
  Administered 2022-10-21 – 2022-10-23 (×3): 20 mg via ORAL
  Filled 2022-10-21 (×3): qty 2

## 2022-10-21 MED ORDER — HYDRALAZINE HCL 20 MG/ML IJ SOLN
10.0000 mg | Freq: Once | INTRAMUSCULAR | Status: AC
  Administered 2022-10-21: 10 mg via INTRAVENOUS

## 2022-10-21 MED ORDER — HYDROMORPHONE HCL 1 MG/ML IJ SOLN
0.5000 mg | Freq: Once | INTRAMUSCULAR | Status: AC
  Administered 2022-10-21: 0.5 mg via INTRAVENOUS
  Filled 2022-10-21: qty 1

## 2022-10-21 MED ORDER — POTASSIUM CHLORIDE 10 MEQ/100ML IV SOLN
10.0000 meq | INTRAVENOUS | Status: AC
  Administered 2022-10-21 (×4): 10 meq via INTRAVENOUS
  Filled 2022-10-21 (×4): qty 100

## 2022-10-21 MED ORDER — PRAVASTATIN SODIUM 20 MG PO TABS: 20.0000 mg | ORAL_TABLET | Freq: Every day | ORAL | Status: DC

## 2022-10-21 MED ORDER — LACTATED RINGERS IV BOLUS
1000.0000 mL | Freq: Once | INTRAVENOUS | Status: AC
  Administered 2022-10-21: 1000 mL via INTRAVENOUS

## 2022-10-21 MED ORDER — HYDROMORPHONE HCL 2 MG/ML IJ SOLN
2.0000 mg | Freq: Once | INTRAMUSCULAR | Status: AC
  Administered 2022-10-21: 2 mg via INTRAVENOUS
  Filled 2022-10-21: qty 1

## 2022-10-21 MED ORDER — SUCRALFATE 1 G PO TABS
1.0000 g | ORAL_TABLET | Freq: Three times a day (TID) | ORAL | Status: DC
  Administered 2022-10-21 – 2022-10-26 (×18): 1 g via ORAL
  Filled 2022-10-21 (×20): qty 1

## 2022-10-21 MED ORDER — LORAZEPAM 2 MG/ML IJ SOLN
0.5000 mg | Freq: Three times a day (TID) | INTRAMUSCULAR | Status: DC | PRN
  Administered 2022-10-21 – 2022-10-23 (×4): 0.5 mg via INTRAVENOUS
  Filled 2022-10-21 (×4): qty 1

## 2022-10-21 MED ORDER — GUAIFENESIN 200 MG PO TABS
200.0000 mg | ORAL_TABLET | ORAL | Status: DC | PRN
  Administered 2022-10-22: 200 mg via ORAL
  Filled 2022-10-21: qty 1

## 2022-10-21 NOTE — ED Notes (Signed)
Patient has a urine culture in the main lab 

## 2022-10-21 NOTE — ED Triage Notes (Signed)
Pt arrives via EMS from home for c/o abdominal pain with n/v and recent flare up of gastroparesis. Has also had some diarrhea.  HTN at 220/110  for EMS, has not taken home meds. EMS BGL 202.

## 2022-10-21 NOTE — ED Notes (Signed)
ED TO INPATIENT HANDOFF REPORT  Name/Age/Gender Catherine Pope 57 y.o. female  Code Status Code Status History     Date Active Date Inactive Code Status Order ID Comments User Context   10/14/2022 1558 10/15/2022 2311 Full Code [id number]  Jonah Blue, MD ED   12/27/2016 1856 12/28/2016 1426 Full Code [id number]  Tia Alert, MD Inpatient    Questions for Most Recent Historical Code Status (Order [id number])     Question Answer   By: Consent: discussion documented in EHR            Home/SNF/Other Home  Chief Complaint Gastroparesis [K31.84]  Level of Care/Admitting Diagnosis ED Disposition     ED Disposition  Admit   Condition  --   Comment  Hospital Area: [facility] [[id number]]  Level of Care: Telemetry [5]  Admit to tele based on following criteria: Other see comments  Comments: ....  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   per pt tested a month ago/ no results yet   Hypertension    Migraine    Neck pain    Seasonal allergies    Sinusitis     Allergies No Known Allergies  IV Location/Drains/Wounds Patient Lines/Drains/Airways Status     Active Line/Drains/Airways     Name Placement date Placement time Site Days   Peripheral IV 10/21/22 22 G 1.25" Left;Posterior Hand 10/21/22  1140  Hand  less than 1             Labs/Imaging Results for orders placed or performed during the hospital encounter of 10/21/22 (from the past 48 hour(s))  Urinalysis, Routine w reflex microscopic -Urine, Clean Catch     Status: Abnormal   Collection Time: 10/21/22 11:47 AM  Result Value Ref Range   Color, Urine STRAW (A) YELLOW   APPearance HAZY (A) CLEAR   Specific Gravity, Urine 1.010 1.005 - 1.030   pH 7.0 5.0 - 8.0   Glucose, UA 150 (A) NEGATIVE mg/dL   Hgb urine dipstick NEGATIVE NEGATIVE   Bilirubin Urine NEGATIVE NEGATIVE   Ketones, ur 20 (A) NEGATIVE mg/dL   Protein, ur 841 (A) NEGATIVE mg/dL   Nitrite NEGATIVE NEGATIVE   Leukocytes,Ua NEGATIVE NEGATIVE   RBC / HPF 0-5 0 - 5 RBC/hpf   WBC, UA 0-5 0 - 5 WBC/hpf   Bacteria, UA RARE (A) NONE SEEN   Squamous Epithelial / HPF 0-5 0 - 5 /HPF   Mucus PRESENT     Comment: Performed at [facility], [address], [place], [place] [address]  CBC with Differential/Platelet     Status: Abnormal   Collection Time: 10/21/22 11:52 AM  Result Value Ref Range   WBC 15.4 (H) 4.0 - 10.5 K/uL   RBC 4.98 3.87 - 5.11 MIL/uL   Hemoglobin 14.4 12.0 - 15.0 g/dL  HCT 42.6 36.0 - 46.0 %   MCV 85.5 80.0 - 100.0 fL   MCH 28.9 26.0 - 34.0 pg   MCHC 33.8 30.0 - 36.0 g/dL   RDW 16.1 09.6 - 04.5 %   Platelets 299 150 - 400 K/uL   nRBC 0.0 0.0 - 0.2 %   Neutrophils Relative % 79 %   Neutro Abs 12.4 (H) 1.7 - 7.7 K/uL   Lymphocytes Relative 14 %   Lymphs Abs 2.1 0.7 - 4.0 K/uL   Monocytes Relative 5 %   Monocytes Absolute 0.8 0.1 - 1.0 K/uL   Eosinophils Relative 0 %   Eosinophils Absolute 0.0 0.0 - 0.5 K/uL   Basophils Relative 1 %   Basophils Absolute 0.1 0.0 - 0.1 K/uL   Immature Granulocytes 1 %   Abs Immature Granulocytes 0.08 (H) 0.00 - 0.07 K/uL    Comment: Performed at [facility], [address], [place], [place] [address]  Blood gas, venous     Status: Abnormal   Collection Time: 10/21/22 12:19 PM  Result Value Ref  Range   pH, Ven 7.59 (H) 7.25 - 7.43   pCO2, Ven 23 (L) 44 - 60 mmHg   pO2, Ven 162 (H) 32 - 45 mmHg   Bicarbonate 22.1 20.0 - 28.0 mmol/L   Acid-Base Excess 2.2 (H) 0.0 - 2.0 mmol/L   O2 Saturation 100 %   Patient temperature 37.0     Comment: Performed at [facility], [address] [place], [place] [address]  Comprehensive metabolic panel     Status: Abnormal   Collection Time: 10/21/22  2:30 PM  Result Value Ref Range   Sodium 133 (L) 135 - 145 mmol/L   Potassium 3.2 (L) 3.5 - 5.1 mmol/L   Chloride 99 98 - 111 mmol/L   CO2 21 (L) 22 - 32 mmol/L   Glucose, Bld 225 (H) 70 - 99 mg/dL    Comment: Glucose reference range applies only to samples taken after fasting for at least 8 hours.   BUN <5 (L) 6 - 20 mg/dL   Creatinine, Ser 8.29 0.44 - 1.00 mg/dL   Calcium 8.7 (L) 8.9 - 10.3 mg/dL   Total Protein 8.8 (H) 6.5 - 8.1 g/dL   Albumin 4.4 3.5 - 5.0 g/dL   AST 28 15 - 41 U/L   ALT 27 0 - 44 U/L   Alkaline Phosphatase 63 38 - 126 U/L   Total Bilirubin 0.5 0.3 - 1.2 mg/dL   GFR, Estimated >56 >21 mL/min    Comment: (NOTE) Calculated using the CKD-EPI Creatinine Equation (2021)    Anion gap 13 5 - 15    Comment: Performed at [facility], [address], [place], [place] [address]  Lipase, blood     Status: None   Collection Time: 10/21/22  2:30 PM  Result Value Ref Range   Lipase 31 11 - 51 U/L    Comment: Performed at [facility], [address] [place], [place] [address]   DG ABD ACUTE 2+V W 1V CHEST  Result Date: 10/21/2022 CLINICAL DATA:  Diffuse mid to low abdominal pain for 1 day. Discomfort for 1 week with diarrhea, nausea and vomiting. EXAM: DG ABDOMEN ACUTE WITH 1 VIEW CHEST COMPARISON:  Chest radiographs 12/15/2020.  Abdominal CT 10/14/2022. FINDINGS: The heart size and mediastinal contours are normal. The lungs are clear. There is no pleural effusion or pneumothorax. No acute osseous findings are identified.  Status post lower cervical fusion. There is a normal nonobstructive bowel gas pattern. The stomach is fluid-filled without significant distension. No pneumoperitoneum. Grossly stable phleboliths overlapping the left L5 transverse process and in the pelvis bilaterally. No acute osseous findings. IMPRESSION: No evidence of acute cardiopulmonary or intra-abdominal process. Fluid-filled stomach. Electronically Signed   By: Carey Bullocks M.D.   On: 10/21/2022 12:54    Pending Labs Unresulted Labs (From admission, onward)    None       Vitals/Pain Today's Vitals   10/21/22 1138 10/21/22 1330 10/21/22 1447 10/21/22 1505  BP: (!) 212/107 (!) 235/108 (!) 206/112 (!) 158/108  Pulse: (!) 115 (!) 107 (!) 113 (!) 129  Resp: (!) 24  16   Temp: 97.6 F (36.4 C)  98.6 F (37 C)   TempSrc: Oral  Oral   SpO2: 100% 97% 99% 97%  Weight: 95.3 kg     Height: 5\' 6"  (1.676 m)     PainSc: 9        Isolation Precautions No active isolations  Medications Medications  lactated ringers infusion (0 mLs Intravenous Stopped 10/21/22 1524)  Oxycodone HCl TABS 10 mg (has no administration in time range)  hydrochlorothiazide (HYDRODIURIL) tablet 25 mg (has no administration in time range)  lisinopril (ZESTRIL) tablet 20 mg (has no administration in time range)  pravastatin (PRAVACHOL) tablet 20 mg (has no administration in time range)  buPROPion (WELLBUTRIN XL) 24 hr tablet 300 mg (has no administration in time range)  famotidine (PEPCID) IVPB 20 mg premix (has no administration in time range)  sucralfate (CARAFATE) tablet 1 g (has no administration in time range)  metoCLOPramide (REGLAN) injection 10 mg (10 mg Intravenous Given 10/21/22 1214)  diphenhydrAMINE (BENADRYL) injection 12.5 mg (12.5 mg Intravenous Given 10/21/22 1217)  HYDROmorphone (DILAUDID) injection 0.5 mg (0.5 mg Intravenous Given 10/21/22 1218)  lactated ringers bolus 1,000 mL (0 mLs Intravenous Stopped 10/21/22 1524)  HYDROmorphone (DILAUDID)  injection 1 mg (1 mg Intravenous Given 10/21/22 1322)  hydrALAZINE (APRESOLINE) injection 10 mg (10 mg Intravenous Given 10/21/22 1445)  HYDROmorphone (DILAUDID) injection 1 mg (1 mg Intravenous Given 10/21/22 1555)  HYDROmorphone (DILAUDID) injection 2 mg (2 mg Intravenous Given 10/21/22 1700)    Mobility walks

## 2022-10-21 NOTE — Plan of Care (Signed)
  Problem: Education: Goal: Ability to describe self-care measures that may prevent or decrease complications (Diabetes Survival Skills Education) will improve Outcome: Progressing   Problem: Health Behavior/Discharge Planning: Goal: Ability to identify and utilize available resources and services will improve Outcome: Progressing Goal: Ability to manage health-related needs will improve Outcome: Progressing   Problem: Activity: Goal: Risk for activity intolerance will decrease Outcome: Progressing   Problem: Safety: Goal: Ability to remain free from injury will improve Outcome: Progressing   Problem: Skin Integrity: Goal: Risk for impaired skin integrity will decrease Outcome: Progressing   Problem: Pain Managment: Goal: General experience of comfort will improve Outcome: Not Progressing

## 2022-10-21 NOTE — H&P (Signed)
History and Physical    Catherine Pope WUJ:[id number] DOB: Apr 15, 1965 DOA: 10/21/2022  PCP: Courtney Paris, NP  Patient coming from: home  I have personally briefly reviewed patient's old medical records in [facility]  Chief Complaint: n/v   HPI: Catherine Pope is a 56 y.o. female with medical history significant of  depression/anxiety, DM II with history of DKA, gastroparesis, migraine as well as HTN  who has interim history of admission 7/13-7/14 with diagnosis of mild DKA  s/p presentation with n/v/ and abdominal pain. Patient now return to ED with recurrent symptoms of n/v/ and abdominal pain ongoing for x 1 day. Patient also noted associated diarrhea as well. She noted over the last day she has not been able to keep anything down. She notes no fevers /sob/chest pain /dysuria / but does note  nasal congestion as well as chills and sweats.  ED Course:  Afeb  bp 212/107, hr 115, rr 24  st  100%  Labs  Ua: glu 150, ketones 20,  bacteria rare  Ph: 7.59, pco2 23 biacr 22.1, sat 100% Wbc 15.4, plt299 Vbg:7.59, pco2 23 Na 133, K 3.2, bicarb 21, glu 225, cr 0.61 ast 28, Alt 27, alphos 63 Lipase 31 KUB IMPRESSION: No evidence of acute cardiopulmonary or intra-abdominal process. Fluid-filled stomach.  EKG: sinus tachycardia , ventricular bigeminy , LAE, prolonged QT 525  Tx  LR at 125, reglan , dilaudid, benadryl, hydralazine  Review of Systems: As per HPI otherwise 10 point review of systems negative.   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   per pt tested a month ago/ no results yet   Hypertension    Migraine    Neck pain    Seasonal allergies    Sinusitis     Past Surgical History:  Procedure Laterality Date   ABDOMINAL HYSTERECTOMY     per pt , in her 30's   ANTERIOR CERVICAL DECOMP/DISCECTOMY  FUSION N/A 12/27/2016   Procedure: CERVICAL SIX-SEVEN ANTERIOR CERVICAL DECOMPRESSION/DISCECTOMY FUSION;  Surgeon: Tia Alert, MD;  Location: [facility] OR;  Service: Neurosurgery;  Laterality: N/A;   ANTERIOR CRUCIATE LIGAMENT REPAIR     right knee   BREAST EXCISIONAL BIOPSY Left over 20 years ago   benign   BREAST SURGERY     2 lumps removed at age 89, left breast/ benign   RADIOLOGY WITH ANESTHESIA Left 12/11/2017   Procedure: MRI cervical spine and left shoulder WITH ANESTHESIA;  Surgeon: Radiologist, Medication, MD;  Location: [facility] OR;  Service: Radiology;  Laterality: Left;   SHOULDER ARTHROSCOPY     right shoulder     reports that she has been smoking cigarettes. She has a 7.5 pack-year smoking history. She has never used smokeless tobacco. She reports that she does not drink alcohol and does not use drugs.  No Known Allergies  Family History  Problem Relation Age of Onset   Hypertension Mother    Heart Problems Father    Diabetes Father    Hypertension Other    Heart disease Other    Stroke Sister    Kidney disease Brother     Prior to Admission medications   Medication Sig Start Date End Date Taking? Authorizing Provider  buPROPion (WELLBUTRIN XL) 300 MG 24 hr tablet Take  300 mg by mouth daily. 04/05/21   Courtney Paris, NP  cetirizine (ZYRTEC) 10 MG tablet Take 10 mg by mouth at bedtime. 04/20/22   [provider]  Galcanezumab-gnlm [facility]) 120 MG/ML SOAJ  01/05/20   Courtney Paris, NP  hydrochlorothiazide (HYDRODIURIL) 25 MG tablet Take 1 tablet (25 mg total) by mouth daily. 08/20/18   Kallie Locks, FNP  Lancets [facility] ULTRASOFT) lancets Use as instructed 06/22/14   Toy Cookey, MD  lisinopril (ZESTRIL) 20 MG tablet Take 1 tablet (20 mg total) by mouth daily. 08/20/18   Kallie Locks, FNP  lovastatin (MEVACOR) 20 MG tablet TAKE 1 TABLET (20 MG TOTAL) BY MOUTH AT BEDTIME. 06/02/19   Kallie Locks, FNP  metoCLOPramide (REGLAN) 5 MG tablet Take by mouth.     Courtney Paris, NP  ondansetron (ZOFRAN ODT) 4 MG disintegrating tablet Take 1 tablet (4 mg total) by mouth every 8 (eight) hours as needed for up to 12 doses for nausea or vomiting. 12/15/20   Gloris Manchester, MD  Oxycodone HCl 10 MG TABS Take 10 mg by mouth in the morning, at noon, and at bedtime.    [provider]  tirzepatide [facility]gg[facility] Keen) 15 MG/0.5ML Pen Inject 15 mg into the skin once a week. Injection on Fridays    [provider]  TIZANIDINE HCL PO Take 1 tablet by mouth in the morning, at noon, and at bedtime.    [provider]  traZODone (DESYREL) 100 MG tablet Take 1 tablet (100 mg total) by mouth at bedtime as needed for sleep. 05/18/21 10/14/22  Cleotis Nipper, MD    Physical Exam: Vitals:   10/21/22 1138 10/21/22 1330 10/21/22 1447 10/21/22 1505  BP: (!) 212/107 (!) 235/108 (!) 206/112 (!) 158/108  Pulse: (!) 115 (!) 107 (!) 113 (!) 129  Resp: (!) 24  16   Temp: 97.6 F (36.4 C)  98.6 F (37 C)   TempSrc: Oral  Oral   SpO2: 100% 97% 99% 97%  Weight: 95.3 kg     Height: 5\' 6"  (1.676 m)       Constitutional: NAD, calm, comfortable Vitals:   10/21/22 1138 10/21/22 1330 10/21/22 1447 10/21/22 1505  BP: (!) 212/107 (!) 235/108 (!) 206/112 (!) 158/108  Pulse: (!) 115 (!) 107 (!) 113 (!) 129  Resp: (!) 24  16   Temp: 97.6 F (36.4 C)  98.6 F (37 C)   TempSrc: Oral  Oral   SpO2: 100% 97% 99% 97%  Weight: 95.3 kg     Height: 5\' 6"  (1.676 m)      Eyes: PERRL, lids and conjunctivae normal ENMT: Mucous membranes are moist. Posterior pharynx clear of any exudate or lesions.Normal dentition.  Neck: normal, supple, no masses, no thyromegaly Respiratory: clear to auscultation bilaterally, no wheezing, no crackles. Normal respiratory effort. No accessory muscle use.  Cardiovascular: tachycardic and normal rhythm, no murmurs / rubs / gallops. No extremity edema. 2+ pedal pulses.  Abdomen: + epigastric tenderness, no masses palpated. No hepatosplenomegaly.  Bowel sounds positive.  Musculoskeletal: no clubbing / cyanosis. No joint deformity upper and lower extremities. Good ROM, no contractures. Normal muscle tone.  Skin: no rashes, lesions, ulcers. No induration Neurologic: CN 2-12 grossly intact. Sensation intact, Strength 5/5 in all 4.  Psychiatric: Normal judgment and insight. Alert and oriented x 3. Normal mood.    Labs on Admission: I have personally reviewed following labs and imaging studies  CBC: Recent Labs  Lab 10/15/22 0831 10/21/22 1152  WBC 16.3* 15.4*  NEUTROABS 9.6* 12.4*  HGB 13.4 14.4  HCT 38.9 42.6  MCV 84.7 85.5  PLT 220 299   Basic Metabolic Panel: Recent Labs  Lab 10/14/22 1923 10/14/22 2351 10/15/22 0236 10/15/22 0855 10/21/22 1430  NA 133*  132* 132* 136 133* 133*  K 3.5  3.5 3.2* 3.1* 3.5 3.2*  CL 100  99 103 102 102 99  CO2 20*  19* 21* 25 21* 21*  GLUCOSE 263*  266* 256* 203* 202* 225*  BUN 8  9 9 9 9  <5*  CREATININE 0.92  0.92 0.88 1.10* 0.90 0.61  CALCIUM 9.0  8.9 8.5* 8.7* 8.7* 8.7*   GFR: Estimated Creatinine Clearance: 92.4 mL/min (by C-G formula based on SCr of 0.61 mg/dL). Liver Function Tests: Recent Labs  Lab 10/15/22 0855 10/21/22 1430  AST 95* 28  ALT 43 27  ALKPHOS 58 63  BILITOT 0.5 0.5  PROT 6.9 8.8*  ALBUMIN 3.4* 4.4   Recent Labs  Lab 10/21/22 1430  LIPASE 31   No results for input(s): "AMMONIA" in the last 168 hours. Coagulation Profile: No results for input(s): "INR", "PROTIME" in the last 168 hours. Cardiac Enzymes: No results for input(s): "CKTOTAL", "CKMB", "CKMBINDEX", "TROPONINI" in the last 168 hours. BNP (last 3 results) No results for input(s): "PROBNP" in the last 8760 hours. HbA1C: No results for input(s): "HGBA1C" in the last 72 hours. CBG: Recent Labs  Lab 10/15/22 0418 10/15/22 0534 10/15/22 0649 10/15/22 0756 10/15/22 1241  GLUCAP 183* 196* 196* 174* 178*   Lipid Profile: No results for input(s): "CHOL", "HDL", "LDLCALC",  "TRIG", "CHOLHDL", "LDLDIRECT" in the last 72 hours. Thyroid Function Tests: No results for input(s): "TSH", "T4TOTAL", "FREET4", "T3FREE", "THYROIDAB" in the last 72 hours. Anemia Panel: No results for input(s): "VITAMINB12", "FOLATE", "FERRITIN", "TIBC", "IRON", "RETICCTPCT" in the last 72 hours. Urine analysis:    Component Value Date/Time   COLORURINE STRAW (A) 10/21/2022 1147   APPEARANCEUR HAZY (A) 10/21/2022 1147   LABSPEC 1.010 10/21/2022 1147   PHURINE 7.0 10/21/2022 1147   GLUCOSEU 150 (A) 10/21/2022 1147   HGBUR NEGATIVE 10/21/2022 1147   BILIRUBINUR NEGATIVE 10/21/2022 1147   BILIRUBINUR small (A) 10/14/2022 1132   BILIRUBINUR neg 07/19/2017 1116   KETONESUR 20 (A) 10/21/2022 1147   PROTEINUR 100 (A) 10/21/2022 1147   UROBILINOGEN 0.2 10/14/2022 1132   UROBILINOGEN 0.2 06/18/2017 1144   NITRITE NEGATIVE 10/21/2022 1147   LEUKOCYTESUR NEGATIVE 10/21/2022 1147    Radiological Exams on Admission: DG ABD ACUTE 2+V W 1V CHEST  Result Date: 10/21/2022 CLINICAL DATA:  Diffuse mid to low abdominal pain for 1 day. Discomfort for 1 week with diarrhea, nausea and vomiting. EXAM: DG ABDOMEN ACUTE WITH 1 VIEW CHEST COMPARISON:  Chest radiographs 12/15/2020.  Abdominal CT 10/14/2022. FINDINGS: The heart size and mediastinal contours are normal. The lungs are clear. There is no pleural effusion or pneumothorax. No acute osseous findings are identified. Status post lower cervical fusion. There is a normal nonobstructive bowel gas pattern. The stomach is fluid-filled without significant distension. No pneumoperitoneum. Grossly stable phleboliths overlapping the left L5 transverse process and in the pelvis bilaterally. No acute osseous findings. IMPRESSION: No evidence of acute cardiopulmonary or intra-abdominal process. Fluid-filled stomach. Electronically Signed   By: Carey Bullocks M.D.   On: 10/21/2022 12:54    EKG: Independently reviewed.   Assessment/Plan  Acute exacerbation of  gastroparesis complicated by prolonged QT  - admit to cardiac tele  -repeat EKG in 6 hours  -  ativan prn, pepcid prn  -holding QT prolonging agents until QT has normalized  -supportive pain medication  -supportive ivfs    Leukocytosis -presumed stress response  -no signs of infection  -continue to monitor counts and fever curve   Uncontrolled Hypertension  - due to pain  and inability to take po med -treat pain  -resume home regimen as able  -prn hydralazine s/p once pain treated  DM II -iss/fs - last A1C  Depression/anxiety  -resume home regimen once med rec completed   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

## 2022-10-21 NOTE — Progress Notes (Signed)
At [employer] informed of pt being in RED MEWS status due to BP 210/90 HR 117. Writer went into the room to assess the patient. Pt stated she is in intense  abdominal pain and that is what is driving up BP. But BP  is managed at home. Outgoing RN, Marquis Lunch is working on getting her BP meds and pain medicine before leaving her shift and she has paged attending.  Viktoria, RN administered 20 mg lisinopril @1936 , 0.5 mg ativan @ 1839 and 10 mg oxyIR @1935 . Writer returned in the room at 2050 while NT was preparing to get vitals . Pt  states her pain remains 10/10 and vitals  now are :BP  208/95 HR 122.  Informed pt primary RN, Thayer Ohm will be administering toradol and hydralazine momentarily. RED MEWS protocol is in place. Plan of care is ongoing.

## 2022-10-21 NOTE — ED Provider Notes (Addendum)
[place] EMERGENCY DEPARTMENT AT [facility] Provider Note   CSN: [id number] Arrival date & time: 10/21/22  1127     History  Chief Complaint  Patient presents with   Abdominal Pain    Catherine Pope is a 57 y.o. female.  57 year old female presents with diffuse abdominal pain with associated nonbilious emesis.  History of gastroparesis.  Has been seen for this recently and required admission.  States she thought she went into DKA at that time.  Denies any fever or chills.  Cannot keep anything down.  Symptoms started approximately 12 to 24 hours ago.  Denies any urinary symptoms.  Symptoms unresponsive to home medications       Home Medications Prior to Admission medications   Medication Sig Start Date End Date Taking? Authorizing Provider  buPROPion (WELLBUTRIN XL) 300 MG 24 hr tablet Take 300 mg by mouth daily. 04/05/21   Courtney Paris, NP  cetirizine (ZYRTEC) 10 MG tablet Take 10 mg by mouth at bedtime. 04/20/22   [provider]  Galcanezumab-gnlm [facility]) 120 MG/ML SOAJ  01/05/20   Courtney Paris, NP  hydrochlorothiazide (HYDRODIURIL) 25 MG tablet Take 1 tablet (25 mg total) by mouth daily. 08/20/18   Kallie Locks, FNP  Lancets [facility] ULTRASOFT) lancets Use as instructed 06/22/14   Toy Cookey, MD  lisinopril (ZESTRIL) 20 MG tablet Take 1 tablet (20 mg total) by mouth daily. 08/20/18   Kallie Locks, FNP  lovastatin (MEVACOR) 20 MG tablet TAKE 1 TABLET (20 MG TOTAL) BY MOUTH AT BEDTIME. 06/02/19   Kallie Locks, FNP  metoCLOPramide (REGLAN) 5 MG tablet Take by mouth.    Courtney Paris, NP  ondansetron (ZOFRAN ODT) 4 MG disintegrating tablet Take 1 tablet (4 mg total) by mouth every 8 (eight) hours as needed for up to 12 doses for nausea or vomiting. 12/15/20   Gloris Manchester, MD  Oxycodone HCl 10 MG TABS Take 10 mg by mouth in the morning, at noon, and at bedtime.    [provider]  tirzepatide [facility] Keen) 15 MG/0.5ML Pen Inject 15  mg into the skin once a week. Injection on Fridays    [provider]  TIZANIDINE HCL PO Take 1 tablet by mouth in the morning, at noon, and at bedtime.    [provider]  traZODone (DESYREL) 100 MG tablet Take 1 tablet (100 mg total) by mouth at bedtime as needed for sleep. 05/18/21 10/14/22  Cleotis Nipper, MD      Allergies    Patient has no known allergies.    Review of Systems   Review of Systems  All other systems reviewed and are negative.   Physical Exam Updated Vital Signs BP (!) 212/107 (BP Location: Right Arm)   Pulse (!) 115   Temp 97.6 F (36.4 C) (Oral)   Resp (!) 24   Ht 1.676 m (5\' 6" )   Wt 95.3 kg   SpO2 100%   BMI 33.89 kg/m  Physical Exam Vitals and nursing note reviewed.  Constitutional:      General: She is not in acute distress.    Appearance: Normal appearance. She is well-developed. She is not toxic-appearing.  HENT:     Head: Normocephalic and atraumatic.  Eyes:     General: Lids are normal.     Conjunctiva/sclera: Conjunctivae normal.     Pupils: Pupils are equal, round, and reactive to light.  Neck:     Thyroid: No thyroid mass.  Trachea: No tracheal deviation.  Cardiovascular:     Rate and Rhythm: Normal rate and regular rhythm.     Heart sounds: Normal heart sounds. No murmur heard.    No gallop.  Pulmonary:     Effort: Pulmonary effort is normal. No respiratory distress.     Breath sounds: Normal breath sounds. No stridor. No decreased breath sounds, wheezing, rhonchi or rales.  Abdominal:     General: There is no distension.     Palpations: Abdomen is soft.     Tenderness: There is generalized abdominal tenderness. There is no rebound.  Musculoskeletal:        General: No tenderness. Normal range of motion.     Cervical back: Normal range of motion and neck supple.  Skin:    General: Skin is warm and dry.     Findings: No abrasion or rash.  Neurological:     Mental Status: She is alert and oriented to person,  place, and time. Mental status is at baseline.     GCS: GCS eye subscore is 4. GCS verbal subscore is 5. GCS motor subscore is 6.     Cranial Nerves: Cranial nerves are intact. No cranial nerve deficit.     Sensory: No sensory deficit.     Motor: Motor function is intact.  Psychiatric:        Attention and Perception: Attention normal.        Speech: Speech normal.        Behavior: Behavior normal.     ED Results / Procedures / Treatments   Labs (all labs ordered are listed, but only abnormal results are displayed) Labs Reviewed  CBC WITH DIFFERENTIAL/PLATELET  COMPREHENSIVE METABOLIC PANEL  LIPASE, BLOOD  URINALYSIS, ROUTINE W REFLEX MICROSCOPIC  BLOOD GAS, VENOUS    EKG None  Radiology No results found.  Procedures Procedures    Medications Ordered in ED Medications  metoCLOPramide (REGLAN) injection 10 mg (has no administration in time range)  diphenhydrAMINE (BENADRYL) injection 12.5 mg (has no administration in time range)  HYDROmorphone (DILAUDID) injection 0.5 mg (has no administration in time range)  lactated ringers infusion (has no administration in time range)  lactated ringers bolus 1,000 mL (has no administration in time range)    ED Course/ Medical Decision Making/ A&P                             Medical Decision Making Amount and/or Complexity of Data Reviewed Labs: ordered. Radiology: ordered.  Risk Prescription drug management.   Presents for abdominal pain and vomiting.  History of diabetic gas paresis.  Treated with IV antiemetics along with IV fluids and pain medications.  Continues to be uncomfortable.  No evidence of DKA.  Patient had acute abdominal series which showed no evidence of obstruction..  Patient will require admission.        Final Clinical Impression(s) / ED Diagnoses Final diagnoses:  None    Rx / DC Orders ED Discharge Orders     None         Lorre Nick, MD 10/21/22 1521    Lorre Nick,  MD 10/21/22 [phone]

## 2022-10-22 ENCOUNTER — Encounter (HOSPITAL_COMMUNITY): Payer: Self-pay | Admitting: Internal Medicine

## 2022-10-22 DIAGNOSIS — E1165 Type 2 diabetes mellitus with hyperglycemia: Secondary | ICD-10-CM

## 2022-10-22 DIAGNOSIS — D72829 Elevated white blood cell count, unspecified: Secondary | ICD-10-CM

## 2022-10-22 DIAGNOSIS — I10 Essential (primary) hypertension: Secondary | ICD-10-CM

## 2022-10-22 DIAGNOSIS — E872 Acidosis, unspecified: Secondary | ICD-10-CM | POA: Diagnosis not present

## 2022-10-22 DIAGNOSIS — E1143 Type 2 diabetes mellitus with diabetic autonomic (poly)neuropathy: Principal | ICD-10-CM

## 2022-10-22 DIAGNOSIS — K3184 Gastroparesis: Secondary | ICD-10-CM

## 2022-10-22 LAB — RESPIRATORY PANEL BY PCR

## 2022-10-22 LAB — BASIC METABOLIC PANEL
Anion gap: 15 (ref 5–15)
Anion gap: 13 (ref 5–15)
BUN: 10 mg/dL (ref 6–20)
BUN: 10 mg/dL (ref 6–20)
CO2: 18 mmol/L — ABNORMAL LOW (ref 22–32)
CO2: 18 mmol/L — ABNORMAL LOW (ref 22–32)
Calcium: 8.4 mg/dL — ABNORMAL LOW (ref 8.9–10.3)
Calcium: 8.5 mg/dL — ABNORMAL LOW (ref 8.9–10.3)
Chloride: 96 mmol/L — ABNORMAL LOW (ref 98–111)
Chloride: 98 mmol/L (ref 98–111)
Creatinine, Ser: 0.87 mg/dL (ref 0.44–1.00)
Creatinine, Ser: 0.86 mg/dL (ref 0.44–1.00)
GFR, Estimated: >60 mL/min (ref >60)
GFR, Estimated: >60 mL/min (ref >60)
Glucose, Bld: 207 mg/dL — ABNORMAL HIGH (ref 70–99)
Glucose, Bld: 188 mg/dL — ABNORMAL HIGH (ref 70–99)
Potassium: 3 mmol/L — ABNORMAL LOW (ref 3.5–5.1)
Potassium: 3.5 mmol/L (ref 3.5–5.1)
Sodium: 129 mmol/L — ABNORMAL LOW (ref 135–145)
Sodium: 129 mmol/L — ABNORMAL LOW (ref 135–145)

## 2022-10-22 LAB — CBC
HCT: 46 % (ref 36.0–46.0)
Hemoglobin: 15.8 g/dL — ABNORMAL HIGH (ref 12.0–15.0)
MCH: 29.4 pg (ref 26.0–34.0)
MCHC: 34.3 g/dL (ref 30.0–36.0)
MCV: 85.5 fL (ref 80.0–100.0)
Platelets: 247 10*3/uL (ref 150–400)
RBC: 5.38 MIL/uL — ABNORMAL HIGH (ref 3.87–5.11)
RDW: 14.4 % (ref 11.5–15.5)
WBC: 20.6 10*3/uL — ABNORMAL HIGH (ref 4.0–10.5)
nRBC: 0 % (ref 0.0–0.2)

## 2022-10-22 LAB — COMPREHENSIVE METABOLIC PANEL
ALT: 25 U/L (ref 0–44)
AST: 20 U/L (ref 15–41)
Albumin: 4 g/dL (ref 3.5–5.0)
Alkaline Phosphatase: 63 U/L (ref 38–126)
Anion gap: 15 (ref 5–15)
BUN: 8 mg/dL (ref 6–20)
CO2: 18 mmol/L — ABNORMAL LOW (ref 22–32)
Calcium: 8.6 mg/dL — ABNORMAL LOW (ref 8.9–10.3)
Chloride: 97 mmol/L — ABNORMAL LOW (ref 98–111)
Creatinine, Ser: 0.84 mg/dL (ref 0.44–1.00)
GFR, Estimated: >60 mL/min (ref >60)
Glucose, Bld: 240 mg/dL — ABNORMAL HIGH (ref 70–99)
Potassium: 2.9 mmol/L — ABNORMAL LOW (ref 3.5–5.1)
Sodium: 130 mmol/L — ABNORMAL LOW (ref 135–145)
Total Bilirubin: 0.9 mg/dL (ref 0.3–1.2)
Total Protein: 8 g/dL (ref 6.5–8.1)

## 2022-10-22 LAB — GLUCOSE, CAPILLARY
Glucose-Capillary: 220 mg/dL — ABNORMAL HIGH (ref 70–99)
Glucose-Capillary: 198 mg/dL — ABNORMAL HIGH (ref 70–99)
Glucose-Capillary: 190 mg/dL — ABNORMAL HIGH (ref 70–99)
Glucose-Capillary: 175 mg/dL — ABNORMAL HIGH (ref 70–99)

## 2022-10-22 MED ORDER — LACTATED RINGERS IV SOLN: INTRAVENOUS | Status: DC

## 2022-10-22 MED ORDER — INSULIN ASPART 100 UNIT/ML IJ SOLN
0.0000 [IU] | Freq: Four times a day (QID) | INTRAMUSCULAR | Status: DC
  Administered 2022-10-22 – 2022-10-24 (×6): 2 [IU] via SUBCUTANEOUS
  Administered 2022-10-24 (×3): 1 [IU] via SUBCUTANEOUS

## 2022-10-22 MED ORDER — POTASSIUM CHLORIDE 10 MEQ/100ML IV SOLN
10.0000 meq | INTRAVENOUS | Status: AC
  Administered 2022-10-22 (×6): 10 meq via INTRAVENOUS
  Filled 2022-10-22 (×5): qty 100

## 2022-10-22 MED ORDER — POTASSIUM CHLORIDE CRYS ER 20 MEQ PO TBCR: 40.0000 meq | EXTENDED_RELEASE_TABLET | ORAL | Status: DC

## 2022-10-22 MED ORDER — METOCLOPRAMIDE HCL 5 MG/ML IJ SOLN
10.0000 mg | Freq: Four times a day (QID) | INTRAMUSCULAR | Status: DC
  Administered 2022-10-22 – 2022-10-26 (×17): 10 mg via INTRAVENOUS
  Filled 2022-10-22 (×17): qty 2

## 2022-10-22 MED ORDER — MAGNESIUM SULFATE 4 GM/100ML IV SOLN
4.0000 g | Freq: Once | INTRAVENOUS | Status: AC
  Administered 2022-10-22: 4 g via INTRAVENOUS
  Filled 2022-10-22: qty 100

## 2022-10-22 MED ORDER — FENTANYL CITRATE PF 50 MCG/ML IJ SOSY
12.5000 ug | PREFILLED_SYRINGE | Freq: Once | INTRAMUSCULAR | Status: AC | PRN
  Administered 2022-10-22: 12.5 ug via INTRAVENOUS
  Filled 2022-10-22: qty 1

## 2022-10-22 NOTE — Progress Notes (Signed)
MEWS Progress Note  Patient Details Name: Catherine Pope MRN: [id number] DOB: [birth year] Today's Date: 10/22/2022   MEWS Flowsheet Documentation:  Assess: MEWS Score Temp: 98.2 F (36.8 C) BP: (!) 194/83 MAP (mmHg): 114 Pulse Rate: (!) 122 Resp: 18 Level of Consciousness: Alert SpO2: 100 % O2 Device: Room Air Patient Activity (if Appropriate): In bed Assess: MEWS Score MEWS Temp: 0 MEWS Systolic: 0 MEWS Pulse: 2 MEWS RR: 0 MEWS LOC: 0 MEWS Score: 2 MEWS Score Color: Yellow Assess: SIRS CRITERIA SIRS Temperature : 0 SIRS Respirations : 0 SIRS Pulse: 1 SIRS WBC: 1 SIRS Score Sum : 2 SIRS Temperature : 0 SIRS Pulse: 1 SIRS Respirations : 0 SIRS WBC: 0 SIRS Score Sum : 1 Assess: if the MEWS score is Yellow or Red Were vital signs taken at a resting state?: Yes Focused Assessment: No change from prior assessment Does the patient meet 2 or more of the SIRS criteria?: Yes Does the patient have a confirmed or suspected source of infection?: No MEWS guidelines implemented : No, previously red, continue vital signs every 4 hours (now a yellow mews (down from a red mews)) Notify: Charge Nurse/RN Name of Charge Nurse/RN Notified: Vera, RN was made aware by dayshift RN and is keeping abreast of patients condition/inteventions Provider Notification Provider Name/Title: Skip Mayer Date Provider Notified: 10/21/22 Time Provider Notified: 1920 Method of Notification: Page Notification Reason: Change in status      [name] 10/22/2022, 12:41 AM

## 2022-10-22 NOTE — Progress Notes (Signed)
Progress Note   Patient: Catherine Pope ZDG:[id number] DOB: 06/18/1965 DOA: 10/21/2022     1 DOS: the patient was seen and examined on 10/22/2022   Brief hospital course: 57 year old woman PMH including diabetes mellitus type 2, gastroparesis, recent hospitalization for DKA, presented with nausea and vomiting, abdominal pain.  Imaging was unremarkable.  Admitted for acute flare of diabetic gastroparesis with intractable nausea and vomiting.  Assessment and Plan: Acute exacerbation of diabetic gastroparesis complicated by prolonged QT  NAGMA Continues to have nausea and vomiting, unable to tolerate diet.  Abdominal imaging this admission was unremarkable as well as CT abdomen pelvis 7/13.  Exam is benign.  LFTs and lipase within normal limits.  No evidence to suggest concern for intra-abdominal process. Schedule Reglan.  Antiemetics.  Supportive care.  IV fluids.  DM type 2 w/ hyperglycemia, uncontrolled, HgbA1c 11.8  CBG in the 200s.  On glipizide, Mounjaro. Sliding scale insulin every 6, start low-dose long-acting insulin.   Would benefit from insulin on discharge. Consult dietitian and diabetic RN coordinator  Marked leukocytosis Neutrophilia noted.  No signs or symptoms of infection.  Afebrile and hemodynamic stable.  Probably stress response.  Follow clinically.  Hypokalemia Hypomagnesemia Replete.  Prolonged QT Repeat EKG 2239 7/20 ST, QTc has shortened, now near normal. Repeat EKG today to assess    Uncontrolled hypertension  Patient reports blood pressure usually does well unless she is in pain. Treat pain and acute conditions.  Continue lisinopril and hydrochlorothiazide.  Continue hydralazine as needed.   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  Supportive care.  Hospitalized 7/13-7/14 admitted for DKA (was not on insulin, only Mounjaro), AKI. Discharged on  Mounjaro only.     Subjective:  Still having nausea and vomiting, abdominal pain.   Physical Exam: Vitals:   10/22/22 0228 10/22/22 0430 10/22/22 0906 10/22/22 1243  BP:  (!) 195/86 (!) 189/79 (!) 160/60  Pulse:  (!) 121 (!) 125 (!) 119  Resp:  18 17 18   Temp:  98.4 F (36.9 C) 98.8 F (37.1 C) 98.6 F (37 C)  TempSrc:  Oral Oral Oral  SpO2:  100% 97% 97%  Weight: 98.8 kg     Height:       Physical Exam Vitals reviewed.  Constitutional:      General: She is not in acute distress.    Appearance: She is not ill-appearing or toxic-appearing.  Cardiovascular:     Rate and Rhythm: Normal rate and regular rhythm.     Heart sounds: No murmur heard. Pulmonary:     Effort: Pulmonary effort is normal. No respiratory distress.     Breath sounds: No wheezing, rhonchi or rales.  Abdominal:     General: There is no distension.     Palpations: Abdomen is soft.     Tenderness: There is no abdominal tenderness. There is no guarding.  Neurological:     Mental Status: She is alert.  Psychiatric:        Mood and Affect: Mood normal.        Behavior: Behavior normal.     Data Reviewed: Afebrille, RR 20s, SBP 190-219 CBG 200s K+ 2.9 > replete Mg2+ was 1.3 7/20 CO2 18, but AG WNL LFTs normal  Lipase was WNL WBC 20.6, no sig change RVP negative AAS NAD, fluid-filled stomach CT abd/pelvis 7/13: nonacute  EKG 2239 7/20 ST, QTc  has shortened  Family Communication: none  Disposition: Status is: Inpatient Remains inpatient appropriate because: n/v     Time spent: 35 minutes  Author: Brendia Sacks, MD 10/22/2022 12:45 PM  For on call review [email or web address].

## 2022-10-22 NOTE — Progress Notes (Signed)
   10/22/22 1802  Vitals  Temp 98.5 F (36.9 C)  Temp Source Oral  BP (!) 168/61  MAP (mmHg) 90  BP Location Right Arm  BP Method Automatic  Patient Position (if appropriate) Lying  Pulse Rate (!) 120  Pulse Rate Source Monitor  Resp 17  Level of Consciousness  Level of Consciousness Alert  MEWS COLOR  MEWS Score Color Yellow  Oxygen Therapy  SpO2 99 %  O2 Device Room Air  Patient Activity (if Appropriate) In bed  MEWS Score  MEWS Temp 0  MEWS Systolic 0  MEWS Pulse 2  MEWS RR 0  MEWS LOC 0  MEWS Score 2   Patient is still on yellow MEWS. Continous Q4 vital monitoring implemented. Plan of care continued.

## 2022-10-22 NOTE — Plan of Care (Signed)
  Problem: Fluid Volume: Goal: Ability to achieve a balanced intake and output will improve Outcome: Progressing   Problem: Metabolic: Goal: Ability to maintain appropriate glucose levels will improve Outcome: Progressing   

## 2022-10-22 NOTE — Hospital Course (Addendum)
57 year old woman PMH including diabetes mellitus type 2, gastroparesis, recent hospitalization for DKA, presented with nausea and vomiting, abdominal pain.  Imaging was unremarkable.  Admitted for acute flare of diabetic gastroparesis with intractable nausea and vomiting.

## 2022-10-22 NOTE — Plan of Care (Signed)
  Problem: Coping: Goal: Level of anxiety will decrease Outcome: Progressing   Problem: Pain Managment: Goal: General experience of comfort will improve Outcome: Progressing   

## 2022-10-22 NOTE — Progress Notes (Signed)
   10/22/22 1048  TOC Brief Assessment  Insurance and Status Reviewed  Patient has primary care physician Yes  Home environment has been reviewed Home  Prior level of function: Independent  Prior/Current Home Services No current home services  Social Determinants of Health Reivew SDOH reviewed no interventions necessary  Readmission risk has been reviewed Yes  Transition of care needs no transition of care needs at this time

## 2022-10-23 DIAGNOSIS — R9431 Abnormal electrocardiogram [ECG] [EKG]: Secondary | ICD-10-CM | POA: Diagnosis not present

## 2022-10-23 DIAGNOSIS — E1143 Type 2 diabetes mellitus with diabetic autonomic (poly)neuropathy: Secondary | ICD-10-CM | POA: Diagnosis not present

## 2022-10-23 DIAGNOSIS — K3184 Gastroparesis: Secondary | ICD-10-CM

## 2022-10-23 DIAGNOSIS — E1165 Type 2 diabetes mellitus with hyperglycemia: Secondary | ICD-10-CM

## 2022-10-23 LAB — BASIC METABOLIC PANEL
Anion gap: 12 (ref 5–15)
BUN: 8 mg/dL (ref 6–20)
CO2: 21 mmol/L — ABNORMAL LOW (ref 22–32)
Calcium: 8.4 mg/dL — ABNORMAL LOW (ref 8.9–10.3)
Chloride: 96 mmol/L — ABNORMAL LOW (ref 98–111)
Creatinine, Ser: 0.73 mg/dL (ref 0.44–1.00)
GFR, Estimated: >60 mL/min (ref >60)
Glucose, Bld: 168 mg/dL — ABNORMAL HIGH (ref 70–99)
Potassium: 3.2 mmol/L — ABNORMAL LOW (ref 3.5–5.1)
Sodium: 129 mmol/L — ABNORMAL LOW (ref 135–145)

## 2022-10-23 LAB — GLUCOSE, CAPILLARY
Glucose-Capillary: 157 mg/dL — ABNORMAL HIGH (ref 70–99)
Glucose-Capillary: 164 mg/dL — ABNORMAL HIGH (ref 70–99)
Glucose-Capillary: 181 mg/dL — ABNORMAL HIGH (ref 70–99)

## 2022-10-23 LAB — MAGNESIUM: Magnesium: 2.2 mg/dL (ref 1.7–2.4)

## 2022-10-23 LAB — PHOSPHORUS: Phosphorus: 2.2 mg/dL — ABNORMAL LOW (ref 2.5–4.6)

## 2022-10-23 MED ORDER — POTASSIUM CHLORIDE 10 MEQ/100ML IV SOLN
10.0000 meq | INTRAVENOUS | Status: AC
  Administered 2022-10-23 (×6): 10 meq via INTRAVENOUS
  Filled 2022-10-23: qty 100

## 2022-10-23 MED ORDER — SODIUM CHLORIDE 0.9 % IV SOLN: INTRAVENOUS | Status: DC

## 2022-10-23 MED ORDER — LORAZEPAM 2 MG/ML IJ SOLN
0.5000 mg | Freq: Three times a day (TID) | INTRAMUSCULAR | Status: DC | PRN
  Administered 2022-10-24: 0.5 mg via INTRAVENOUS
  Filled 2022-10-23: qty 1

## 2022-10-23 MED ORDER — PROCHLORPERAZINE EDISYLATE 10 MG/2ML IJ SOLN
10.0000 mg | Freq: Four times a day (QID) | INTRAMUSCULAR | Status: DC | PRN
  Administered 2022-10-23 – 2022-10-24 (×2): 10 mg via INTRAVENOUS
  Filled 2022-10-23 (×2): qty 2

## 2022-10-23 MED ORDER — LISINOPRIL 20 MG PO TABS
40.0000 mg | ORAL_TABLET | Freq: Every day | ORAL | Status: DC
  Administered 2022-10-24 – 2022-10-26 (×3): 40 mg via ORAL
  Filled 2022-10-23 (×3): qty 2

## 2022-10-23 MED ORDER — POTASSIUM PHOSPHATES 15 MMOLE/5ML IV SOLN
15.0000 mmol | Freq: Once | INTRAVENOUS | Status: AC
  Administered 2022-10-23: 15 mmol via INTRAVENOUS
  Filled 2022-10-23: qty 5

## 2022-10-23 NOTE — Progress Notes (Signed)
Initial Nutrition Assessment  DOCUMENTATION CODES:   Obesity unspecified  INTERVENTION:  Diet edu handouts added to AVS   Continue clear liquid diet and advance as tolerated   NUTRITION DIAGNOSIS:   Inadequate oral intake related to limited prior education, inability to eat as evidenced by other (comment) (clear liquid diet).   GOAL:   Patient will meet greater than or equal to 90% of their needs   MONITOR:   PO intake, Labs, I & O's, Skin, Supplement acceptance, Weight trends  REASON FOR ASSESSMENT:   Consult Assessment of nutrition requirement/status, Diet education  ASSESSMENT:   57 y.o. female with PMHx including depression/anxiety, tobacco abuse, T2DM with history of DKA, gastroparesis, migraine and HTN who presents with recurrent N/V and abdominal pain ongoing for 1 day  Patient recently admitted on 7/13-14 for the same symptoms as mentioned above   Patient has been on ozempic and had not been on insulin regimen for ~5 years   Patient continues to have N/V, not tolerating po intake. Patient likely has had acute weight loss due to ongoing symptoms.   Plans to start insulin regimen upon dc   Labs: CBG 164, Na 129, K+ 3.2, Cl 96, phos 2.2, A1c 11.8 Meds: Wellbutrin XL, pepcid, insulin, reglan, KCl, potassium phosphate, carafate, NS   Wt: variable wt 10/22/22 98.8 kg  10/14/22 100.8 kg  10/14/22 86.2 kg  12/15/20 86 kg  06/06/19 97.1 kg  06/03/19 95.9 kg   PO: no meals documented at this time due to patient being on clear liquid diet  I/O's: +1.68 L since admission     NUTRITION - FOCUSED PHYSICAL EXAM:  RD working remotely   Diet Order:   Diet Order             Diet clear liquid Room service appropriate? Yes; Fluid consistency: Thin  Diet effective now                   EDUCATION NEEDS:   Education needs have been addressed  Skin:  Skin Assessment: Reviewed RN Assessment  Last BM:  7/22  Height:   Ht Readings from Last 1  Encounters:  10/21/22 5\' 6"  (1.676 m)    Weight:   Wt Readings from Last 1 Encounters:  10/22/22 98.8 kg    Ideal Body Weight:     BMI:  Body mass index is 35.16 kg/m.  Estimated Nutritional Needs:   Kcal:  1800-2000  Protein:  120-150 g  Fluid:  > 2L    Leodis Rains, RDN, LDN  Clinical Nutrition

## 2022-10-23 NOTE — Inpatient Diabetes Management (Signed)
Inpatient Diabetes Program Recommendations  AACE/ADA: New Consensus Statement on Inpatient Glycemic Control (2015)  Target Ranges:  Prepandial:   less than 140 mg/dL      Peak postprandial:   less than 180 mg/dL (1-2 hours)      Critically ill patients:  140 - 180 mg/dL   Lab Results  Component Value Date   GLUCAP 164 (H) 10/23/2022   HGBA1C 11.8 (H) 10/14/2022    Review of Glycemic Control  Diabetes history: DM2 Outpatient Diabetes medications: glipizide 2.5 mg every day, Mounjaro 15 mg weekly on Friday Current orders for Inpatient glycemic control: Novolog 0-9 Q6H  HgbA1C - 11.8% (average blood sugar of 292 mg/dL)  Inpatient Diabetes Program Recommendations:    Spoke with pt about her diabetes and home regimen for diabetes control. Pt reports being followed by PCP and is currently not taking insulin at this time. States she was on insulin a couple of years ago, and blood sugars improved. Reviewed insulin pen administration and pt was very familiar with this. Discussed HgbA1C results (11.8%) and explained that current A1C indicates an average glucose of 292 mg/dL over the past 2-3 months. Discussed glucose and HgbA1C goals. Discussed importance of checking CBGs and maintaining good control to prevent long and short-term complications. Explained how hyperglycemia leads to damage within blood vessels which lead to the common complications seen with uncontrolled diabetes. Stressed to the patient the importance of improving glycemic control to prevent further complications from uncontrolled diabetes. Discussed impact of nutrition, exercise, stress, sickness, and medications on diabetes control. Pt states she still drinks regular sodas and eats large portions, but trying to work on this. Encouraged pt to check blood sugars 3-4x/day and takes logbook to MD appts. Pt verbalized understanding of information discussed. Had no further questions.  Recommendations for home:  Lantus 15 units at  bedtime Novolog 5 units TID with meals   Thank you. Ailene Ards, RD, LDN, CDCES Inpatient Diabetes Coordinator [phone]

## 2022-10-23 NOTE — Plan of Care (Signed)
  Problem: Education: Goal: Ability to describe self-care measures that may prevent or decrease complications (Diabetes Survival Skills Education) will improve Outcome: Progressing Goal: Individualized Educational Video(s) Outcome: Progressing   Problem: Cardiac: Goal: Ability to maintain an adequate cardiac output will improve Outcome: Progressing   Problem: Health Behavior/Discharge Planning: Goal: Ability to identify and utilize available resources and services will improve Outcome: Progressing Goal: Ability to manage health-related needs will improve Outcome: Progressing   Problem: Fluid Volume: Goal: Ability to achieve a balanced intake and output will improve Outcome: Progressing   Problem: Metabolic: Goal: Ability to maintain appropriate glucose levels will improve Outcome: Progressing   Problem: Nutritional: Goal: Maintenance of adequate nutrition will improve Outcome: Progressing Goal: Maintenance of adequate weight for body size and type will improve Outcome: Progressing   Problem: Respiratory: Goal: Will regain and/or maintain adequate ventilation Outcome: Progressing   Problem: Urinary Elimination: Goal: Ability to achieve and maintain adequate renal perfusion and functioning will improve Outcome: Progressing   Problem: Education: Goal: Knowledge of General Education information will improve Description: Including pain rating scale, medication(s)/side effects and non-pharmacologic comfort measures Outcome: Progressing   Problem: Health Behavior/Discharge Planning: Goal: Ability to manage health-related needs will improve Outcome: Progressing   Problem: Clinical Measurements: Goal: Ability to maintain clinical measurements within normal limits will improve Outcome: Progressing Goal: Will remain free from infection Outcome: Progressing Goal: Diagnostic test results will improve Outcome: Progressing Goal: Respiratory complications will improve Outcome:  Progressing Goal: Cardiovascular complication will be avoided Outcome: Progressing   Problem: Activity: Goal: Risk for activity intolerance will decrease Outcome: Progressing   Problem: Nutrition: Goal: Adequate nutrition will be maintained Outcome: Progressing   Problem: Coping: Goal: Level of anxiety will decrease Outcome: Progressing   Problem: Elimination: Goal: Will not experience complications related to bowel motility Outcome: Progressing Goal: Will not experience complications related to urinary retention Outcome: Progressing   Problem: Pain Managment: Goal: General experience of comfort will improve Outcome: Progressing   Problem: Safety: Goal: Ability to remain free from injury will improve Outcome: Progressing   Problem: Skin Integrity: Goal: Risk for impaired skin integrity will decrease Outcome: Progressing   

## 2022-10-23 NOTE — Progress Notes (Signed)
Progress Note   Patient: Catherine Pope UVO:[id number] DOB: Mar 16, 1966 DOA: 10/21/2022     2 DOS: the patient was seen and examined on 10/23/2022   Brief hospital course: 57 year old woman PMH including diabetes mellitus type 2, gastroparesis, recent hospitalization for DKA, presented with nausea and vomiting, abdominal pain.  Imaging was unremarkable.  Admitted for acute flare of diabetic gastroparesis with intractable nausea and vomiting.  Assessment and Plan: Acute exacerbation of diabetic gastroparesis complicated by prolonged QT  NAGMA Abdominal imaging this admission was unremarkable as well as CT abdomen pelvis 7/13.  Exam remains benign.  LFTs and lipase were within normal limits.  No evidence to suggest concern for intra-abdominal process. Continues to have nausea and vomiting, unable to tolerate diet.  Continue scheduled Reglan.  Antiemetics.  Supportive care.  IV fluids.   DM type 2 w/ hyperglycemia, uncontrolled, HgbA1c 11.8  On glipizide, Mounjaro as outpatient Sliding scale insulin every 6, started low-dose long-acting insulin.   CBG better today, <200 last 24 hours Would benefit from insulin on discharge. Consulted dietitian and diabetic RN coordinator   Marked leukocytosis Neutrophilia noted.  No signs or symptoms of infection.  Afebrile and hemodynamic stable.  Probably stress response.  Follow clinically. CBC in AM   Hypokalemia Hypomagnesemia -- resolved Hyponatremia  Hypophosphatemia  Replete K+, phos, sodium   Prolonged QT Repeat EKG 2239 7/20 ST, QTc has shortened, now near normal. EKG SR, prolonged QT, avoid prolonging agents if possible    Uncontrolled hypertension  Patient reported blood pressure usually does well unless she is in pain. Treat pain and acute conditions.  Continue lisinopril and hydrochlorothiazide.  Continue hydralazine as needed.   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  Supportive care.   Hospitalized 7/13-7/14 admitted for DKA (was not on insulin, only Mounjaro), AKI. Discharged on Mounjaro only.       Subjective:  Feels about the same today Still w/ nausea, vomiting, abdominal cramps Tolerating some water  Physical Exam: Vitals:   10/23/22 0038 10/23/22 0503 10/23/22 0723 10/23/22 1155  BP: (!) 178/61 (!) 194/73 (!) 177/80 (!) 173/97  Pulse: (!) 116 (!) 117 (!) 116 (!) 118  Resp: 18 17 18 17   Temp: 99.1 F (37.3 C) 98.4 F (36.9 C) 98.3 F (36.8 C) 98.9 F (37.2 C)  TempSrc: Oral Oral Oral Oral  SpO2: 100% 99% 100% 99%  Weight:      Height:       Physical Exam Vitals reviewed.  Constitutional:      General: She is not in acute distress.    Appearance: She is not ill-appearing or toxic-appearing.  Cardiovascular:     Rate and Rhythm: Normal rate and regular rhythm.     Heart sounds: No murmur heard. Pulmonary:     Effort: Pulmonary effort is normal. No respiratory distress.     Breath sounds: No wheezing, rhonchi or rales.  Abdominal:     General: There is no distension.     Palpations: Abdomen is soft.  Neurological:     Mental Status: She is alert.  Psychiatric:        Mood and Affect: Mood normal.        Behavior: Behavior normal.     Data Reviewed: Na+ 129 K+ 3.2   Family Communication: none  Disposition: Status is: Inpatient Remains inpatient appropriate because: n/v  Time spent: 20 minutes  Author: Brendia Sacks, MD 10/23/2022 4:32 PM  For on call review [email or web address].

## 2022-10-24 DIAGNOSIS — D72829 Elevated white blood cell count, unspecified: Secondary | ICD-10-CM | POA: Diagnosis not present

## 2022-10-24 DIAGNOSIS — I10 Essential (primary) hypertension: Secondary | ICD-10-CM

## 2022-10-24 DIAGNOSIS — E1165 Type 2 diabetes mellitus with hyperglycemia: Secondary | ICD-10-CM | POA: Diagnosis not present

## 2022-10-24 DIAGNOSIS — E1143 Type 2 diabetes mellitus with diabetic autonomic (poly)neuropathy: Secondary | ICD-10-CM | POA: Diagnosis not present

## 2022-10-24 LAB — CBC
HCT: 44.6 % (ref 36.0–46.0)
Hemoglobin: 14.2 g/dL (ref 12.0–15.0)
MCH: 28 pg (ref 26.0–34.0)
MCHC: 31.8 g/dL (ref 30.0–36.0)
MCV: 87.8 fL (ref 80.0–100.0)
Platelets: 313 10*3/uL (ref 150–400)
RBC: 5.08 MIL/uL (ref 3.87–5.11)
RDW: 14.4 % (ref 11.5–15.5)
WBC: 17.3 10*3/uL — ABNORMAL HIGH (ref 4.0–10.5)
nRBC: 0 % (ref 0.0–0.2)

## 2022-10-24 LAB — BASIC METABOLIC PANEL
Anion gap: 10 (ref 5–15)
BUN: 7 mg/dL (ref 6–20)
CO2: 19 mmol/L — ABNORMAL LOW (ref 22–32)
Calcium: 8.5 mg/dL — ABNORMAL LOW (ref 8.9–10.3)
Chloride: 98 mmol/L (ref 98–111)
Creatinine, Ser: 0.62 mg/dL (ref 0.44–1.00)
GFR, Estimated: >60 mL/min (ref >60)
Glucose, Bld: 161 mg/dL — ABNORMAL HIGH (ref 70–99)
Potassium: 3.6 mmol/L (ref 3.5–5.1)
Sodium: 127 mmol/L — ABNORMAL LOW (ref 135–145)

## 2022-10-24 LAB — PHOSPHORUS: Phosphorus: 2.4 mg/dL — ABNORMAL LOW (ref 2.5–4.6)

## 2022-10-24 LAB — GLUCOSE, CAPILLARY
Glucose-Capillary: 144 mg/dL — ABNORMAL HIGH (ref 70–99)
Glucose-Capillary: 147 mg/dL — ABNORMAL HIGH (ref 70–99)
Glucose-Capillary: 149 mg/dL — ABNORMAL HIGH (ref 70–99)
Glucose-Capillary: 155 mg/dL — ABNORMAL HIGH (ref 70–99)
Glucose-Capillary: 162 mg/dL — ABNORMAL HIGH (ref 70–99)

## 2022-10-24 LAB — MAGNESIUM: Magnesium: 1.9 mg/dL (ref 1.7–2.4)

## 2022-10-24 MED ORDER — POTASSIUM CHLORIDE 10 MEQ/100ML IV SOLN
10.0000 meq | INTRAVENOUS | Status: AC
  Administered 2022-10-24 (×2): 10 meq via INTRAVENOUS
  Filled 2022-10-24 (×2): qty 100

## 2022-10-24 MED ORDER — POTASSIUM PHOSPHATES 15 MMOLE/5ML IV SOLN
15.0000 mmol | Freq: Once | INTRAVENOUS | Status: AC
  Administered 2022-10-24: 15 mmol via INTRAVENOUS
  Filled 2022-10-24: qty 5

## 2022-10-24 MED ORDER — INSULIN ASPART 100 UNIT/ML IJ SOLN
0.0000 [IU] | Freq: Three times a day (TID) | INTRAMUSCULAR | Status: DC
  Administered 2022-10-25 (×3): 2 [IU] via SUBCUTANEOUS
  Administered 2022-10-26: 1 [IU] via SUBCUTANEOUS

## 2022-10-24 MED ORDER — SODIUM CHLORIDE 1 G PO TABS
1.0000 g | ORAL_TABLET | Freq: Three times a day (TID) | ORAL | Status: AC
  Administered 2022-10-25 (×3): 1 g via ORAL
  Filled 2022-10-24 (×3): qty 1

## 2022-10-24 NOTE — Plan of Care (Signed)
  Problem: Education: Goal: Ability to describe self-care measures that may prevent or decrease complications (Diabetes Survival Skills Education) will improve Outcome: Progressing Goal: Individualized Educational Video(s) Outcome: Progressing   Problem: Cardiac: Goal: Ability to maintain an adequate cardiac output will improve Outcome: Progressing   Problem: Health Behavior/Discharge Planning: Goal: Ability to identify and utilize available resources and services will improve Outcome: Progressing Goal: Ability to manage health-related needs will improve Outcome: Progressing   Problem: Fluid Volume: Goal: Ability to achieve a balanced intake and output will improve Outcome: Progressing   Problem: Metabolic: Goal: Ability to maintain appropriate glucose levels will improve Outcome: Progressing   Problem: Nutritional: Goal: Maintenance of adequate nutrition will improve Outcome: Progressing Goal: Maintenance of adequate weight for body size and type will improve Outcome: Progressing   Problem: Respiratory: Goal: Will regain and/or maintain adequate ventilation Outcome: Progressing   Problem: Urinary Elimination: Goal: Ability to achieve and maintain adequate renal perfusion and functioning will improve Outcome: Progressing   Problem: Education: Goal: Knowledge of General Education information will improve Description: Including pain rating scale, medication(s)/side effects and non-pharmacologic comfort measures Outcome: Progressing   Problem: Health Behavior/Discharge Planning: Goal: Ability to manage health-related needs will improve Outcome: Progressing   Problem: Clinical Measurements: Goal: Ability to maintain clinical measurements within normal limits will improve Outcome: Progressing Goal: Will remain free from infection Outcome: Progressing Goal: Diagnostic test results will improve Outcome: Progressing Goal: Respiratory complications will improve Outcome:  Progressing Goal: Cardiovascular complication will be avoided Outcome: Progressing   Problem: Activity: Goal: Risk for activity intolerance will decrease Outcome: Progressing   Problem: Nutrition: Goal: Adequate nutrition will be maintained Outcome: Progressing   Problem: Coping: Goal: Level of anxiety will decrease Outcome: Progressing   Problem: Elimination: Goal: Will not experience complications related to bowel motility Outcome: Progressing Goal: Will not experience complications related to urinary retention Outcome: Progressing   Problem: Pain Managment: Goal: General experience of comfort will improve Outcome: Progressing   Problem: Safety: Goal: Ability to remain free from injury will improve Outcome: Progressing   Problem: Skin Integrity: Goal: Risk for impaired skin integrity will decrease Outcome: Progressing   

## 2022-10-24 NOTE — TOC Initial Note (Signed)
Transition of Care [facility]) - Initial/Assessment Note    Patient Details  Name: Catherine Pope MRN: [id number] Date of Birth: 12-23-1965  Transition of [facility]) CM/SW Contact:    Otelia Santee, LCSW Phone Number: 10/24/2022, 10:07 AM  Clinical Narrative:                 HHPT has been arranged with [facility] who has accepted pt as charity case. Pt has been approved for 3 PT visits. Will need HHPT order placed.   Expected Discharge Plan: OP Rehab Barriers to Discharge: No Barriers Identified   Patient Goals and CMS Choice Patient states their goals for this hospitalization and ongoing recovery are:: To go home CMS Medicare.gov Compare Post Acute Care list provided to:: Patient Choice offered to / list presented to : Patient [place] ownership interest in [facility].provided to:: Patient    Expected Discharge Plan and Services In-house Referral: NA Discharge Planning Services: NA Post Acute Care Choice: Home Health Living arrangements for the past 2 months: Single Family Home                 DME Arranged: N/A DME Agency: NA       HH Arranged: PT HH Agency: [facility] Date HH Agency Contacted: 10/23/22 Time HH Agency Contacted: 1600 Representative spoke with at [facility] Agency: Haywood Lasso  Prior Living Arrangements/Services Living arrangements for the past 2 months: Single Family Home Lives with:: Self Patient language and need for interpreter reviewed:: Yes Do you feel safe going back to the place where you live?: Yes      Need for Family Participation in Patient Care: No (Comment)   Current home services: DME Criminal Activity/Legal Involvement Pertinent to Current Situation/Hospitalization: No - Comment as needed  Activities of Daily Living Home Assistive Devices/Equipment: CBG Meter ADL Screening (condition at time of admission) Patient's cognitive ability adequate to safely complete daily activities?: Yes Is the patient deaf or have difficulty  hearing?: No Does the patient have difficulty seeing, even when wearing glasses/contacts?: No Does the patient have difficulty concentrating, remembering, or making decisions?: No Patient able to express need for assistance with ADLs?: Yes Does the patient have difficulty dressing or bathing?: No Independently performs ADLs?: No Communication: Independent Dressing (OT): Independent Grooming: Independent Feeding: Independent Bathing: Independent Toileting: Independent In/Out Bed: Independent Walks in Home: Independent Does the patient have difficulty walking or climbing stairs?: No Weakness of Legs: None Weakness of Arms/Hands: None  Permission Sought/Granted   Permission granted to share information with : No              Emotional Assessment   Attitude/Demeanor/Rapport: Unable to Assess Affect (typically observed): Unable to Assess Orientation: : Oriented to Place, Oriented to Self, Oriented to  Time, Oriented to Situation Alcohol / Substance Use: Not Applicable Psych Involvement: No (comment)  Admission diagnosis:  Gastroparesis [K31.84] Patient Active Problem List   Diagnosis Date Noted   Prolonged QT interval 10/23/2022   Metabolic acidosis 10/22/2022   Leukocytosis 10/22/2022   Diabetic gastroparesis (HCC) 10/21/2022   DKA (diabetic ketoacidosis) (HCC) 10/14/2022   Depression 10/14/2022   Right lumbar radiculopathy 06/03/2019   Uncontrolled type 2 diabetes mellitus with hyperglycemia, without long-term current use of insulin (HCC) 10/15/2018   LGSIL on Pap smear of cervix 12/28/2017   Lumbosacral disc herniation 09/12/2017   Hoarseness 04/30/2017   Laryngopharyngeal reflux (LPR) 04/30/2017   Perennial allergic rhinitis 04/30/2017   Chronic pansinusitis 04/30/2017   Cervical vertebral fusion  12/27/2016   Benign essential HTN 03/23/2016   Laryngitis, acute 03/23/2016   Neuropathy 03/23/2016   Tobacco dependence 03/23/2016   Morbid obesity (HCC) 11/26/2013    Migraine without aura 11/26/2013   Dyslipidemia 11/26/2013   Low back pain 03/23/2013   PCP:  Courtney Paris, NP Pharmacy:   [facility] [address] ([place]), [place] - [address] [place] ([place]) [place] [address] Phone: [phone] Fax: [phone]     Social Determinants of Health (SDOH) Social History: SDOH Screenings   Food Insecurity: No Food Insecurity (10/22/2022)  Housing: Low Risk  (10/22/2022)  Transportation Needs: No Transportation Needs (10/22/2022)  Utilities: Not At Risk (10/22/2022)  Depression (PHQ2-9): Low Risk  (06/30/2021)  Financial Resource Strain: High Risk (03/21/2018)  Physical Activity: Inactive (03/21/2018)  Social Connections: Moderately Isolated (03/21/2018)  Stress: Stress Concern Present (03/21/2018)  Tobacco Use: High Risk (10/22/2022)   SDOH Interventions:     Readmission Risk Interventions    10/22/2022   10:48 AM  Readmission Risk Prevention Plan  Post Dischage Appt Complete  Medication Screening Complete  Transportation Screening Complete

## 2022-10-24 NOTE — Progress Notes (Addendum)
Progress Note   Patient: Catherine Pope ZOX:[id number] DOB: [birth year] DOA: 10/21/2022     3 DOS: the patient was seen and examined on 10/24/2022   Brief hospital course: 57 year old woman PMH including diabetes mellitus type 2, gastroparesis, recent hospitalization for DKA, presented with nausea and vomiting, abdominal pain.  Imaging was unremarkable.  Admitted for acute flare of diabetic gastroparesis with intractable nausea and vomiting.  Treated with standard therapy with very slow improvement.  But now tolerating liquids.  Advancing diet as tolerated.  Has mild hyponatremia and leukocytosis of unclear etiology.  Clinically improving.  Assessment and Plan: Acute exacerbation of diabetic gastroparesis complicated by prolonged QT  NAGMA Abdominal imaging this admission was unremarkable as well as CT abdomen pelvis 7/13.  Exam remains benign.  LFTs and lipase were within normal limits.  No evidence to suggest concern for intra-abdominal process. Nausea and vomiting resolved.  Tolerating liquids.  Advance diet as tolerated.  Improving overall.   DM type 2 w/ hyperglycemia, uncontrolled, HgbA1c 11.8  On glipizide, Mounjaro as outpatient Sliding scale insulin every 6, started low-dose long-acting insulin here as inpatient. CBG stable, will change sliding scale to before every meal. Would benefit from insulin on discharge. Consulted dietitian and diabetic Statistician, see note from 7/22 for recommendations   Marked leukocytosis Neutrophilia noted.  No signs or symptoms of infection.  Afebrile and hemodynamic stable.  Probably stress response.  Follow clinically. WBC 20.6 >  17.3.  Elevated September 2022 and July this year.  Significance unclear.  Check CBC with differential in the morning.  Outpatient follow-up recommended.   Hypokalemia Hypomagnesemia -- resolved Hyponatremia  Hypophosphatemia  K+ 3.6, borderline, replete Mg WNL Phos low at 2.4 > replete. Hyponatremia noted  predominantly in July as well as borderline in September 2022.  Probably secondary from poor solute intake.  Expect will improve with diet advancement.  Salt tablet. BMP, Mg, phos in AM   Prolonged QT Repeat EKG 2239 7/20 ST, QTc has shortened, now near normal. EKG SR, prolonged QT, avoid prolonging agents if possible Check EKG in AM to reassess    Hypertensive urgency Uncontrolled hypertension  Patient reported blood pressure usually does well unless she is in pain. Remains elevated. Treat pain and acute conditions.  Continue lisinopril and hydrochlorothiazide.  Continue hydralazine as needed.   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  Supportive care.   Hospitalized 7/13-7/14 admitted for DKA (was not on insulin, only Mounjaro), AKI. Discharged on Mounjaro only.        Subjective:  Feels a lot better No n/v this AM No pain Tolerated clears Would like to advance diet  Physical Exam: Vitals:   10/23/22 1155 10/23/22 1954 10/24/22 0615 10/24/22 1358  BP: (!) 173/97 (!) 181/68 (!) 166/67 (!) 184/77  Pulse: (!) 118  (!) 110   Resp: 17 17 18 19   Temp: 98.9 F (37.2 C) 98.6 F (37 C) 98.7 F (37.1 C) 98.7 F (37.1 C)  TempSrc: Oral Oral Oral Oral  SpO2: 99% 100% 99% 100%  Weight:      Height:       Physical Exam Vitals reviewed.  Constitutional:      General: She is not in acute distress.    Appearance: She is not ill-appearing or toxic-appearing.  Cardiovascular:     Rate and Rhythm: Normal rate and regular rhythm.  Heart sounds: No murmur heard. Pulmonary:     Effort: Pulmonary effort is normal. No respiratory distress.     Breath sounds: No wheezing, rhonchi or rales.  Abdominal:     General: There is no distension.     Palpations: Abdomen is soft.     Tenderness: There is no abdominal tenderness.  Neurological:     Mental Status: She is alert.   Psychiatric:        Mood and Affect: Mood normal.        Behavior: Behavior normal.     Data Reviewed: CBG stable Na+ 127, slightly lowe  Family Communication: none  Disposition: Status is: Inpatient Remains inpatient appropriate because: n/v     Time spent: 20 minutes  Author: Brendia Sacks, MD 10/24/2022 6:51 PM  For on call review [email or web address].

## 2022-10-24 NOTE — Progress Notes (Signed)
PT Cancellation Note  Patient Details Name: Catherine Pope MRN: [id number] DOB: [birth year]   Cancelled Treatment:    Reason Eval/Treat Not Completed: PT screened, no needs identified, will sign off  [facility] PT Acute Rehabilitation Services Office [phone] Weekend [phone]  Rada Hay 10/24/2022, 12:16 PM

## 2022-10-25 ENCOUNTER — Other Ambulatory Visit: Payer: Self-pay

## 2022-10-25 DIAGNOSIS — E1143 Type 2 diabetes mellitus with diabetic autonomic (poly)neuropathy: Secondary | ICD-10-CM | POA: Diagnosis not present

## 2022-10-25 DIAGNOSIS — E872 Acidosis, unspecified: Secondary | ICD-10-CM | POA: Diagnosis not present

## 2022-10-25 DIAGNOSIS — K3184 Gastroparesis: Secondary | ICD-10-CM | POA: Diagnosis not present

## 2022-10-25 DIAGNOSIS — D72829 Elevated white blood cell count, unspecified: Secondary | ICD-10-CM | POA: Diagnosis not present

## 2022-10-25 LAB — BASIC METABOLIC PANEL
Anion gap: 14 (ref 5–15)
BUN: 9 mg/dL (ref 6–20)
CO2: 17 mmol/L — ABNORMAL LOW (ref 22–32)
Calcium: 8.8 mg/dL — ABNORMAL LOW (ref 8.9–10.3)
Chloride: 98 mmol/L (ref 98–111)
Creatinine, Ser: 0.73 mg/dL (ref 0.44–1.00)
GFR, Estimated: >60 mL/min (ref >60)
Glucose, Bld: 149 mg/dL — ABNORMAL HIGH (ref 70–99)
Potassium: 4 mmol/L (ref 3.5–5.1)
Sodium: 129 mmol/L — ABNORMAL LOW (ref 135–145)

## 2022-10-25 LAB — CBC WITH DIFFERENTIAL/PLATELET
Abs Immature Granulocytes: 0.08 10*3/uL — ABNORMAL HIGH (ref 0.00–0.07)
Basophils Absolute: 0.1 10*3/uL (ref 0.0–0.1)
Basophils Relative: 1 %
Eosinophils Absolute: 0.1 10*3/uL (ref 0.0–0.5)
Eosinophils Relative: 0 %
HCT: 45 % (ref 36.0–46.0)
Hemoglobin: 14.7 g/dL (ref 12.0–15.0)
Immature Granulocytes: 0 %
Lymphocytes Relative: 28 %
Lymphs Abs: 5.4 10*3/uL — ABNORMAL HIGH (ref 0.7–4.0)
MCH: 28.8 pg (ref 26.0–34.0)
MCHC: 32.7 g/dL (ref 30.0–36.0)
MCV: 88.2 fL (ref 80.0–100.0)
Monocytes Absolute: 2.1 10*3/uL — ABNORMAL HIGH (ref 0.1–1.0)
Monocytes Relative: 11 %
Neutro Abs: 11.4 10*3/uL — ABNORMAL HIGH (ref 1.7–7.7)
Neutrophils Relative %: 60 %
Platelets: 315 10*3/uL (ref 150–400)
RBC: 5.1 MIL/uL (ref 3.87–5.11)
RDW: 14.5 % (ref 11.5–15.5)
WBC: 19.1 10*3/uL — ABNORMAL HIGH (ref 4.0–10.5)
nRBC: 0 % (ref 0.0–0.2)

## 2022-10-25 LAB — GLUCOSE, CAPILLARY
Glucose-Capillary: 152 mg/dL — ABNORMAL HIGH (ref 70–99)
Glucose-Capillary: 167 mg/dL — ABNORMAL HIGH (ref 70–99)
Glucose-Capillary: 176 mg/dL — ABNORMAL HIGH (ref 70–99)
Glucose-Capillary: 143 mg/dL — ABNORMAL HIGH (ref 70–99)

## 2022-10-25 LAB — MAGNESIUM: Magnesium: 2 mg/dL (ref 1.7–2.4)

## 2022-10-25 LAB — PHOSPHORUS: Phosphorus: 3 mg/dL (ref 2.5–4.6)

## 2022-10-25 MED ORDER — SODIUM BICARBONATE 650 MG PO TABS
650.0000 mg | ORAL_TABLET | Freq: Three times a day (TID) | ORAL | Status: DC
  Administered 2022-10-25 – 2022-10-26 (×4): 650 mg via ORAL
  Filled 2022-10-25 (×4): qty 1

## 2022-10-25 NOTE — Progress Notes (Signed)
Mobility Specialist - Progress Note   10/25/22 1536  Mobility  Activity Ambulated independently in hallway  Level of Assistance Independent  Assistive Device None  Distance Ambulated (ft) 275 ft  Activity Response Tolerated well  Mobility Referral Yes  $Mobility charge 1 Mobility  Mobility Specialist Start Time (ACUTE ONLY) 0224  Mobility Specialist Stop Time (ACUTE ONLY) 0236  Mobility Specialist Time Calculation (min) (ACUTE ONLY) 12 min   Pt received in bed and agreeable to mobility. No complaints during session.  Pt to bed after session with all needs met.    [facility]

## 2022-10-25 NOTE — Progress Notes (Signed)
PROGRESS NOTE    Catherine Pope  ZOX:[id number] DOB: 1965/08/04 DOA: 10/21/2022 PCP: Courtney Paris, NP   Brief Narrative:  57 year old woman PMH including diabetes mellitus type 2, gastroparesis, recent hospitalization for DKA, presented with nausea and vomiting, abdominal pain. Imaging was unremarkable. Admitted for acute flare of diabetic gastroparesis with intractable nausea and vomiting. Treated with standard therapy with very slow improvement.  Diet is being advanced slowly.  Has mild hyponatremia and leukocytosis of unclear etiology.  Clinically improving.  Assessment & Plan:   Acute exacerbation of diabetic gastroparesis -Slowly improving.  Currently on full liquid diet.  Will advance to soft diet today.  If tolerates soft diet today, possible discharge tomorrow. -Continue Reglan.  Outpatient follow-up with PCP/GI  Diabetes mellitus type 2 with hyperglycemia -A1c 11.8.  On glipizide, Mounjaro as an outpatient. -Blood sugars currently stable.  Continue CBGs with SSI.  Diabetes coordinator on 10/23/2022 had recommended Lantus 15 units at bedtime and NovoLog 5 units 3 times daily with meals on discharge  Hyponatremia -Encourage oral intake.  Sodium improving to 129 today.  Continue salt tablets.  Monitor  Nonanion gap metabolic acidosis -Bicarb 17 today.  Start sodium bicarbonate oral supplementation.  Monitor  Leukocytosis -Persistent.  WBCs 19.1 today.  No signs or symptoms of infection.  Afebrile and hemodynamically stable.  Had elevated WBCs in December 03, 2020 and July of this year.  Significance unclear.  Outpatient follow-up.  Prolonged QT -Avoid QT prolonging agents if possible  Hypertensive urgency/uncontrolled hypertension -Improving.  Blood pressure still intermittently elevated.  Continue hydrochlorothiazide and lisinopril.  Continue hydralazine as needed.  Outpatient follow-up with PCP  Depression/anxiety -Continue bupropion  Chronic pain due to DJD of the  back -Continue current pain management regimen with oxycodone as needed  Migraine -Stable.  Continue supportive care  Obesity -Outpatient follow-up   DVT prophylaxis: SCDs Code Status: Full Family Communication: None at bedside Disposition Plan: Status is: Inpatient Remains inpatient appropriate because: Of severity of illness.  Need to advance diet.    Consultants: None  Procedures: None  Antimicrobials: None   Subjective: Patient seen and examined at bedside.  Feels better.  Tolerating liquid diet.  Denies any current nausea or vomiting.  No fever or shortness of breath reported.  Objective: Vitals:   10/24/22 0615 10/24/22 1358 10/24/22 1917 10/25/22 0531  BP: (!) 166/67 (!) 184/77 (!) 167/72 (!) 153/70  Pulse: (!) 110  (!) 110 (!) 110  Resp: 18 19 20 20   Temp: 98.7 F (37.1 C) 98.7 F (37.1 C) 98.7 F (37.1 C) 98.6 F (37 C)  TempSrc: Oral Oral Oral Oral  SpO2: 99% 100% 95% 95%  Weight:      Height:        Intake/Output Summary (Last 24 hours) at 10/25/2022 1012 Last data filed at 10/25/2022 0900 Gross per 24 hour  Intake 775.37 ml  Output --  Net 775.37 ml   Filed Weights   10/21/22 1138 10/22/22 0228  Weight: 95.3 kg 98.8 kg    Examination:  General exam: Appears calm and comfortable.  On room air.  No distress. Respiratory system: Bilateral decreased breath sounds at bases Cardiovascular system: S1 & S2 heard, Rate controlled Gastrointestinal system: Abdomen is obese, nondistended, soft and nontender. Normal bowel sounds heard. Extremities: No cyanosis, clubbing, edema  Central nervous system: Alert and oriented. No focal neurological deficits. Moving extremities Skin: No rashes, lesions or ulcers Psychiatry: Judgement and insight appear normal. Mood & affect appropriate.  Data Reviewed: I have personally reviewed following labs and imaging studies  CBC: Recent Labs  Lab 10/21/22 1152 10/21/22 1819 10/22/22 0539 10/24/22 0535  10/25/22 0522  WBC 15.4* 19.4* 20.6* 17.3* 19.1*  NEUTROABS 12.4*  --   --   --  11.4*  HGB 14.4 15.6* 15.8* 14.2 14.7  HCT 42.6 47.8* 46.0 44.6 45.0  MCV 85.5 88.4 85.5 87.8 88.2  PLT 299 297 247 313 315   Basic Metabolic Panel: Recent Labs  Lab 10/21/22 1819 10/22/22 0719 10/22/22 1355 10/22/22 1542 10/23/22 0448 10/24/22 0535 10/25/22 0522  NA  --    < > 129* 129* 129* 127* 129*  K  --    < > 3.0* 3.5 3.2* 3.6 4.0  CL  --    < > 96* 98 96* 98 98  CO2  --    < > 18* 18* 21* 19* 17*  GLUCOSE  --    < > 207* 188* 168* 161* 149*  BUN  --    < > 10 10 8 7 9   CREATININE 0.66   < > 0.87 0.86 0.73 0.62 0.73  CALCIUM  --    < > 8.4* 8.5* 8.4* 8.5* 8.8*  MG 1.3*  --   --   --  2.2 1.9 2.0  PHOS 2.5  --   --   --  2.2* 2.4* 3.0   < > = values in this interval not displayed.   GFR: Estimated Creatinine Clearance: 94.2 mL/min (by C-G formula based on SCr of 0.73 mg/dL). Liver Function Tests: Recent Labs  Lab 10/21/22 1430 10/22/22 0719  AST 28 20  ALT 27 25  ALKPHOS 63 63  BILITOT 0.5 0.9  PROT 8.8* 8.0  ALBUMIN 4.4 4.0   Recent Labs  Lab 10/21/22 1430  LIPASE 31   No results for input(s): "AMMONIA" in the last 168 hours. Coagulation Profile: No results for input(s): "INR", "PROTIME" in the last 168 hours. Cardiac Enzymes: No results for input(s): "CKTOTAL", "CKMB", "CKMBINDEX", "TROPONINI" in the last 168 hours. BNP (last 3 results) No results for input(s): "PROBNP" in the last 8760 hours. HbA1C: No results for input(s): "HGBA1C" in the last 72 hours. CBG: Recent Labs  Lab 10/24/22 0611 10/24/22 1140 10/24/22 1657 10/24/22 2139 10/25/22 0714  GLUCAP 155* 147* 144* 162* 152*   Lipid Profile: No results for input(s): "CHOL", "HDL", "LDLCALC", "TRIG", "CHOLHDL", "LDLDIRECT" in the last 72 hours. Thyroid Function Tests: No results for input(s): "TSH", "T4TOTAL", "FREET4", "T3FREE", "THYROIDAB" in the last 72 hours. Anemia Panel: No results for input(s):  "VITAMINB12", "FOLATE", "FERRITIN", "TIBC", "IRON", "RETICCTPCT" in the last 72 hours. Sepsis Labs: No results for input(s): "PROCALCITON", "LATICACIDVEN" in the last 168 hours.  Recent Results (from the past 240 hour(s))  Respiratory (~20 pathogens) panel by PCR     Status: None   Collection Time: 10/21/22  8:31 PM   Specimen: Nasopharyngeal Swab; Respiratory  Result Value Ref Range Status   Adenovirus NOT DETECTED NOT DETECTED Final   Coronavirus 229E NOT DETECTED NOT DETECTED Final    Comment: (NOTE) The Coronavirus on the Respiratory Panel, DOES NOT test for the novel  Coronavirus (2019 nCoV)    Coronavirus HKU1 NOT DETECTED NOT DETECTED Final   Coronavirus NL63 NOT DETECTED NOT DETECTED Final   Coronavirus OC43 NOT DETECTED NOT DETECTED Final   Metapneumovirus NOT DETECTED NOT DETECTED Final   Rhinovirus / Enterovirus NOT DETECTED NOT DETECTED Final   Influenza A NOT DETECTED NOT DETECTED  Final   Influenza B NOT DETECTED NOT DETECTED Final   Parainfluenza Virus 1 NOT DETECTED NOT DETECTED Final   Parainfluenza Virus 2 NOT DETECTED NOT DETECTED Final   Parainfluenza Virus 3 NOT DETECTED NOT DETECTED Final   Parainfluenza Virus 4 NOT DETECTED NOT DETECTED Final   Respiratory Syncytial Virus NOT DETECTED NOT DETECTED Final   Bordetella pertussis NOT DETECTED NOT DETECTED Final   Bordetella Parapertussis NOT DETECTED NOT DETECTED Final   Chlamydophila pneumoniae NOT DETECTED NOT DETECTED Final   Mycoplasma pneumoniae NOT DETECTED NOT DETECTED Final    Comment: Performed at [facility], [address] [place], [place] [address]         Radiology Studies: No results found.      Scheduled Meds:  buPROPion  300 mg Oral Daily   heparin  5,000 Units Subcutaneous Q8H   hydrochlorothiazide  25 mg Oral Daily   insulin aspart  0-9 Units Subcutaneous TID WC   lisinopril  40 mg Oral Daily   metoCLOPramide (REGLAN) injection  10 mg Intravenous Q6H   sodium bicarbonate   650 mg Oral TID   sodium chloride  1 g Oral TID WC   sucralfate  1 g Oral TID WC & HS   Continuous Infusions:  famotidine (PEPCID) IV Stopped (10/24/22 2300)          Glade Lloyd, MD [facility] 10/25/2022, 10:12 AM

## 2022-10-25 NOTE — Plan of Care (Signed)
  Problem: Education: Goal: Ability to describe self-care measures that may prevent or decrease complications (Diabetes Survival Skills Education) will improve Outcome: Progressing Goal: Individualized Educational Video(s) Outcome: Progressing   Problem: Cardiac: Goal: Ability to maintain an adequate cardiac output will improve Outcome: Progressing   Problem: Health Behavior/Discharge Planning: Goal: Ability to identify and utilize available resources and services will improve Outcome: Progressing Goal: Ability to manage health-related needs will improve Outcome: Progressing   Problem: Fluid Volume: Goal: Ability to achieve a balanced intake and output will improve Outcome: Progressing   Problem: Metabolic: Goal: Ability to maintain appropriate glucose levels will improve Outcome: Progressing   Problem: Nutritional: Goal: Maintenance of adequate nutrition will improve Outcome: Progressing Goal: Maintenance of adequate weight for body size and type will improve Outcome: Progressing   Problem: Respiratory: Goal: Will regain and/or maintain adequate ventilation Outcome: Progressing   Problem: Urinary Elimination: Goal: Ability to achieve and maintain adequate renal perfusion and functioning will improve Outcome: Progressing   Problem: Education: Goal: Knowledge of General Education information will improve Description: Including pain rating scale, medication(s)/side effects and non-pharmacologic comfort measures Outcome: Progressing   Problem: Health Behavior/Discharge Planning: Goal: Ability to manage health-related needs will improve Outcome: Progressing   Problem: Clinical Measurements: Goal: Ability to maintain clinical measurements within normal limits will improve Outcome: Progressing Goal: Will remain free from infection Outcome: Progressing Goal: Diagnostic test results will improve Outcome: Progressing Goal: Respiratory complications will improve Outcome:  Progressing Goal: Cardiovascular complication will be avoided Outcome: Progressing   Problem: Activity: Goal: Risk for activity intolerance will decrease Outcome: Progressing   Problem: Nutrition: Goal: Adequate nutrition will be maintained Outcome: Progressing   Problem: Coping: Goal: Level of anxiety will decrease Outcome: Progressing   Problem: Elimination: Goal: Will not experience complications related to bowel motility Outcome: Progressing Goal: Will not experience complications related to urinary retention Outcome: Progressing   Problem: Pain Managment: Goal: General experience of comfort will improve Outcome: Progressing   Problem: Safety: Goal: Ability to remain free from injury will improve Outcome: Progressing   Problem: Skin Integrity: Goal: Risk for impaired skin integrity will decrease Outcome: Progressing   

## 2022-10-26 ENCOUNTER — Other Ambulatory Visit (HOSPITAL_COMMUNITY): Payer: Self-pay

## 2022-10-26 DIAGNOSIS — E1143 Type 2 diabetes mellitus with diabetic autonomic (poly)neuropathy: Secondary | ICD-10-CM | POA: Diagnosis not present

## 2022-10-26 DIAGNOSIS — K3184 Gastroparesis: Secondary | ICD-10-CM | POA: Diagnosis not present

## 2022-10-26 LAB — BASIC METABOLIC PANEL
Anion gap: 13 (ref 5–15)
BUN: 23 mg/dL — ABNORMAL HIGH (ref 6–20)
CO2: 19 mmol/L — ABNORMAL LOW (ref 22–32)
Calcium: 8.8 mg/dL — ABNORMAL LOW (ref 8.9–10.3)
Chloride: 100 mmol/L (ref 98–111)
GFR, Estimated: 59 mL/min — ABNORMAL LOW (ref >60)
Potassium: 4.1 mmol/L (ref 3.5–5.1)
Sodium: 132 mmol/L — ABNORMAL LOW (ref 135–145)

## 2022-10-26 LAB — CBC WITH DIFFERENTIAL/PLATELET
Abs Immature Granulocytes: 0.13 10*3/uL — ABNORMAL HIGH (ref 0.00–0.07)
Basophils Absolute: 0.1 10*3/uL (ref 0.0–0.1)
Basophils Relative: 1 %
Eosinophils Absolute: 0.2 10*3/uL (ref 0.0–0.5)
HCT: 40.7 % (ref 36.0–46.0)
Hemoglobin: 13.6 g/dL (ref 12.0–15.0)
Immature Granulocytes: 1 %
Lymphocytes Relative: 28 %
Lymphs Abs: 6.2 10*3/uL — ABNORMAL HIGH (ref 0.7–4.0)
MCH: 29.1 pg (ref 26.0–34.0)
MCHC: 33.4 g/dL (ref 30.0–36.0)
Monocytes Absolute: 1.9 10*3/uL — ABNORMAL HIGH (ref 0.1–1.0)
Monocytes Relative: 9 %
Neutro Abs: 13.2 10*3/uL — ABNORMAL HIGH (ref 1.7–7.7)
Neutrophils Relative %: 60 %
RBC: 4.68 MIL/uL (ref 3.87–5.11)
WBC: 21.8 10*3/uL — ABNORMAL HIGH (ref 4.0–10.5)

## 2022-10-26 LAB — GLUCOSE, CAPILLARY
Glucose-Capillary: 127 mg/dL — ABNORMAL HIGH (ref 70–99)
Glucose-Capillary: 131 mg/dL — ABNORMAL HIGH (ref 70–99)

## 2022-10-26 LAB — MAGNESIUM: Magnesium: 2.1 mg/dL (ref 1.7–2.4)

## 2022-10-26 MED ORDER — ONDANSETRON 4 MG PO TBDP: 4.0000 mg | ORAL_TABLET | Freq: Three times a day (TID) | ORAL | 0 refills | Status: DC | PRN

## 2022-10-26 MED ORDER — LANTUS SOLOSTAR 100 UNIT/ML ~~LOC~~ SOPN: 15.0000 [IU] | PEN_INJECTOR | Freq: Every day | SUBCUTANEOUS | 0 refills | Status: DC

## 2022-10-26 MED ORDER — HYDROCHLOROTHIAZIDE 25 MG PO TABS
25.0000 mg | ORAL_TABLET | Freq: Every day | ORAL | 0 refills | Status: DC
Start: 2022-10-26 — End: 2023-01-29

## 2022-10-26 MED ORDER — SODIUM BICARBONATE 650 MG PO TABS: 650.0000 mg | ORAL_TABLET | Freq: Three times a day (TID) | ORAL | 0 refills | Status: DC

## 2022-10-26 MED ORDER — "PEN NEEDLES 3/16"" 31G X 5 MM MISC": 0 refills | Status: AC

## 2022-10-26 MED ORDER — METOCLOPRAMIDE HCL 10 MG PO TABS: 10.0000 mg | ORAL_TABLET | Freq: Four times a day (QID) | ORAL | 1 refills | Status: AC

## 2022-10-26 MED ORDER — LISINOPRIL 40 MG PO TABS: 40.0000 mg | ORAL_TABLET | Freq: Every day | ORAL | 0 refills | Status: DC

## 2022-10-26 NOTE — Discharge Summary (Addendum)
Physician Discharge Summary  Catherine Pope XBJ:[id number] DOB: 03/09/1966 DOA: 10/21/2022  PCP: Courtney Paris, NP  Admit date: 10/21/2022 Discharge date: 10/26/2022  Admitted From: Home Disposition: Home  Recommendations for Outpatient Follow-up:  Follow up with PCP in 1 week with repeat CBC/BMP Recommend outpatient follow-up with GI Follow up in ED if symptoms worsen or new appear   Home Health: HHPT Equipment/Devices: None  Discharge Condition: Stable CODE STATUS: Full Diet recommendation: Heart healthy/carb modified  Brief/Interim Summary: 57 year old woman PMH including diabetes mellitus type 2, gastroparesis, recent hospitalization for DKA, presented with nausea and vomiting, abdominal pain. Imaging was unremarkable. Admitted for acute flare of diabetic gastroparesis with intractable nausea and vomiting. Treated with standard therapy with very slow improvement.  Diet is being advanced slowly.  Has mild hyponatremia and leukocytosis of unclear etiology.  Clinically improving.  She is currently tolerating carb modified diet and feels okay to go home today.  She will be discharged home today on long-acting insulin along with oral Reglan.  Recommend outpatient follow-up with PCP and GI.  Discharge Diagnoses:   Acute exacerbation of diabetic gastroparesis -Slowly improving.  Currently tolerating carb modified diet and feels okay to go home today.   -Continue Reglan.  Outpatient follow-up with PCP/GI   Diabetes mellitus type 2 with hyperglycemia -A1c 11.8.  On glipizide, Mounjaro as an outpatient. -Blood sugars currently stable.  Continue carb modified diet.  Discharge home on Lantus 15 units daily.  Hold glipizide.  Continue Mounjaro on discharge.    Hyponatremia -Encourage oral intake.  Sodium improving to 132 today.  Treated with salt tablets for a few days: Will not continue on discharge.  Outpatient follow-up.   Nonanion gap metabolic acidosis -Bicarb 19 today.   Currently on sodium bicarbonate oral supplementation.  Continue on discharge for a week.  Outpatient follow-up of bicarb levels by PCP.   Leukocytosis -Persistent.  WBCs 21.8.  Today.  No signs or symptoms of infection.  Afebrile and hemodynamically stable.  Had elevated WBCs in December 03, 2020 and July of this year.  Significance unclear.  Outpatient follow-up.   Prolonged QT -Avoid QT prolonging agents if possible   Hypertensive urgency/uncontrolled hypertension -Improving.  Blood pressure still intermittently elevated.  Continue hydrochlorothiazide and lisinopril. 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  Outpatient follow-up with PCP/pain management   Migraine -Stable.  Continue supportive care   Obesity -Outpatient follow-up    Discharge Instructions  Discharge Instructions     Ambulatory referral to Gastroenterology   Complete by: As directed    Hospital followup   What is the reason for referral?: Other   Diet - low sodium heart healthy   Complete by: As directed    Diet Carb Modified   Complete by: As directed    Increase activity slowly   Complete by: As directed       Allergies as of 10/26/2022   No Known Allergies      Medication List     STOP taking these medications    glipiZIDE 2.5 MG 24 hr tablet Commonly known as: GLUCOTROL XL   lovastatin 20 MG tablet Commonly known as: MEVACOR   TIZANIDINE HCL PO       TAKE these medications    buPROPion 300 MG 24 hr tablet Commonly known as: WELLBUTRIN XL Take 300 mg by mouth daily.   cetirizine 10 MG  tablet Commonly known as: ZYRTEC Take 10 mg by mouth at bedtime.   Erythromycin 250 MG Tbec Take 1 tablet by mouth daily.   hydrochlorothiazide 25 MG tablet Commonly known as: HYDRODIURIL Take 1 tablet (25 mg total) by mouth daily.   Lantus SoloStar 100  UNIT/ML Solostar Pen Generic drug: insulin glargine Inject 15 Units into the skin daily.   lisinopril 40 MG tablet Commonly known as: ZESTRIL Take 1 tablet (40 mg total) by mouth daily. Start taking on: October 27, 2022 What changed:  medication strength how much to take   metoCLOPramide 10 MG tablet Commonly known as: REGLAN Take 1 tablet (10 mg total) by mouth every 6 (six) hours. What changed:  medication strength how much to take when to take this reasons to take this   Mounjaro 15 MG/0.5ML Pen Generic drug: tirzepatide Inject 15 mg into the skin once a week. Injection on Fridays   ondansetron 4 MG disintegrating tablet Commonly known as: Zofran ODT Take 1 tablet (4 mg total) by mouth every 8 (eight) hours as needed for nausea or vomiting.   onetouch ultrasoft lancets Use as instructed   Oxycodone HCl 10 MG Tabs Take 10 mg by mouth in the morning, at noon, and at bedtime.   Pen Needles 3/16" 31G X 5 MM Misc Lantus 15units daily   rosuvastatin 10 MG tablet Commonly known as: CRESTOR Take 10 mg by mouth daily.   sodium bicarbonate 650 MG tablet Take 1 tablet (650 mg total) by mouth 3 (three) times daily.   traZODone 100 MG tablet Commonly known as: DESYREL Take 1 tablet (100 mg total) by mouth at bedtime as needed for sleep. What changed: when to take this   Vitamin D (Ergocalciferol) 1.25 MG (50000 UNIT) Caps capsule Commonly known as: DRISDOL Take 50,000 Units by mouth once a week.        Follow-up Information     Courtney Paris, NP. Schedule an appointment as soon as possible for a visit in 1 week(s).   Specialty: Nurse Practitioner Why: with repeat cbc/bmp Contact information: [address] [place] [address] [phone]                No Known Allergies  Consultations: None   Procedures/Studies: DG ABD ACUTE 2+V W 1V CHEST  Result Date: 10/21/2022 CLINICAL DATA:  Diffuse mid to low abdominal pain for 1 day. Discomfort for 1  week with diarrhea, nausea and vomiting. EXAM: DG ABDOMEN ACUTE WITH 1 VIEW CHEST COMPARISON:  Chest radiographs 12/15/2020.  Abdominal CT 10/14/2022. FINDINGS: The heart size and mediastinal contours are normal. The lungs are clear. There is no pleural effusion or pneumothorax. No acute osseous findings are identified. Status post lower cervical fusion. There is a normal nonobstructive bowel gas pattern. The stomach is fluid-filled without significant distension. No pneumoperitoneum. Grossly stable phleboliths overlapping the left L5 transverse process and in the pelvis bilaterally. No acute osseous findings. IMPRESSION: No evidence of acute cardiopulmonary or intra-abdominal process. Fluid-filled stomach. Electronically Signed   By: Carey Bullocks M.D.   On: 10/21/2022 12:54   CT ABDOMEN PELVIS W CONTRAST  Result Date: 10/14/2022 CLINICAL DATA:  Right lower quadrant abdominal pain. EXAM: CT ABDOMEN AND PELVIS WITH CONTRAST TECHNIQUE: Multidetector CT imaging of the abdomen and pelvis was performed using the standard protocol following bolus administration of intravenous contrast. RADIATION DOSE REDUCTION: This exam was performed according to the departmental dose-optimization program which includes automated exposure control, adjustment of the mA and/or kV  according to patient size and/or use of iterative reconstruction technique. CONTRAST:  75mL OMNIPAQUE IOHEXOL 350 MG/ML SOLN COMPARISON:  December 15, 2020 FINDINGS: Lower chest: Left lower lobe solitary cyst. Hepatobiliary: Hepatic steatosis. Normal appearance of the gallbladder. Pancreas: Unremarkable. No pancreatic ductal dilatation or surrounding inflammatory changes. Spleen: Normal in size without focal abnormality. Adrenals/Urinary Tract: Adrenal glands are unremarkable. Kidneys are normal, without renal calculi, focal lesion, or hydronephrosis. Bladder is unremarkable. Stomach/Bowel: Stomach is within normal limits. Appendix appears normal. No  evidence of bowel wall thickening, distention, or inflammatory changes. Mild left colonic diverticulosis. Vascular/Lymphatic: Aortic atherosclerosis. No enlarged abdominal or pelvic lymph nodes. Reproductive: Status post hysterectomy. No adnexal masses. Other: No abdominal wall hernia or abnormality. No abdominopelvic ascites. Musculoskeletal: No acute or significant osseous findings. IMPRESSION: 1. No acute abnormality identified within the abdomen or pelvis. 2. Hepatic steatosis. 3. Mild left colonic diverticulosis without evidence of diverticulitis. 4. Aortic atherosclerosis. Aortic Atherosclerosis (ICD10-I70.0). Electronically Signed   By: Ted Mcalpine M.D.   On: 10/14/2022 14:53      Subjective: Patient seen and examined at bedside.  Feels much better and is tolerating solid food.  Feels okay to go home today. No nausea, vomiting, chest pain reported. Discharge Exam: Vitals:   10/26/22 0535 10/26/22 1005  BP: (!) 116/56 (!) 153/88  Pulse: 97   Resp: 18   Temp: 97.6 F (36.4 C)   SpO2: 100%     General: Pt is alert, awake, not in acute distress.  On room air. Cardiovascular: rate controlled, S1/S2 + Respiratory: bilateral decreased breath sounds at bases Abdominal: Soft, obese, NT, ND, bowel sounds + Extremities: no edema, no cyanosis    The results of significant diagnostics from this hospitalization (including imaging, microbiology, ancillary and laboratory) are listed below for reference.     Microbiology: Recent Results (from the past 240 hour(s))  Respiratory (~20 pathogens) panel by PCR     Status: None   Collection Time: 10/21/22  8:31 PM   Specimen: Nasopharyngeal Swab; Respiratory  Result Value Ref Range Status   Adenovirus NOT DETECTED NOT DETECTED Final   Coronavirus 229E NOT DETECTED NOT DETECTED Final    Comment: (NOTE) The Coronavirus on the Respiratory Panel, DOES NOT test for the novel  Coronavirus (2019 nCoV)    Coronavirus HKU1 NOT DETECTED NOT  DETECTED Final   Coronavirus NL63 NOT DETECTED NOT DETECTED Final   Coronavirus OC43 NOT DETECTED NOT DETECTED Final   Metapneumovirus NOT DETECTED NOT DETECTED Final   Rhinovirus / Enterovirus NOT DETECTED NOT DETECTED Final   Influenza A NOT DETECTED NOT DETECTED Final   Influenza B NOT DETECTED NOT DETECTED Final   Parainfluenza Virus 1 NOT DETECTED NOT DETECTED Final   Parainfluenza Virus 2 NOT DETECTED NOT DETECTED Final   Parainfluenza Virus 3 NOT DETECTED NOT DETECTED Final   Parainfluenza Virus 4 NOT DETECTED NOT DETECTED Final   Respiratory Syncytial Virus NOT DETECTED NOT DETECTED Final   Bordetella pertussis NOT DETECTED NOT DETECTED Final   Bordetella Parapertussis NOT DETECTED NOT DETECTED Final   Chlamydophila pneumoniae NOT DETECTED NOT DETECTED Final   Mycoplasma pneumoniae NOT DETECTED NOT DETECTED Final    Comment: Performed at [facility], [address] [place], [place] [address]     Labs: BNP (last 3 results) No results for input(s): "BNP" in the last 8760 hours. Basic Metabolic Panel: Recent Labs  Lab 10/21/22 1819 10/22/22 0719 10/22/22 1542 10/23/22 0448 10/24/22 0535 10/25/22 0522 10/26/22 0508  NA  --    < >  129* 129* 127* 129* 132*  K  --    < > 3.5 3.2* 3.6 4.0 4.1  CL  --    < > 98 96* 98 98 100  CO2  --    < > 18* 21* 19* 17* 19*  GLUCOSE  --    < > 188* 168* 161* 149* 136*  BUN  --    < > 10 8 7 9  23*  CREATININE 0.66   < > 0.86 0.73 0.62 0.73 1.10*  CALCIUM  --    < > 8.5* 8.4* 8.5* 8.8* 8.8*  MG 1.3*  --   --  2.2 1.9 2.0 2.1  PHOS 2.5  --   --  2.2* 2.4* 3.0  --    < > = values in this interval not displayed.   Liver Function Tests: Recent Labs  Lab 10/21/22 1430 10/22/22 0719  AST 28 20  ALT 27 25  ALKPHOS 63 63  BILITOT 0.5 0.9  PROT 8.8* 8.0  ALBUMIN 4.4 4.0   Recent Labs  Lab 10/21/22 1430  LIPASE 31   No results for input(s): "AMMONIA" in the last 168 hours. CBC: Recent Labs  Lab 10/21/22 1152  10/21/22 1819 10/22/22 0539 10/24/22 0535 10/25/22 0522 10/26/22 0508  WBC 15.4* 19.4* 20.6* 17.3* 19.1* 21.8*  NEUTROABS 12.4*  --   --   --  11.4* 13.2*  HGB 14.4 15.6* 15.8* 14.2 14.7 13.6  HCT 42.6 47.8* 46.0 44.6 45.0 40.7  MCV 85.5 88.4 85.5 87.8 88.2 87.0  PLT 299 297 247 313 315 313   Cardiac Enzymes: No results for input(s): "CKTOTAL", "CKMB", "CKMBINDEX", "TROPONINI" in the last 168 hours. BNP: Invalid input(s): "POCBNP" CBG: Recent Labs  Lab 10/25/22 0714 10/25/22 1128 10/25/22 1651 10/25/22 2137 10/26/22 0732  GLUCAP 152* 167* 176* 143* 127*   D-Dimer No results for input(s): "DDIMER" in the last 72 hours. Hgb A1c No results for input(s): "HGBA1C" in the last 72 hours. Lipid Profile No results for input(s): "CHOL", "HDL", "LDLCALC", "TRIG", "CHOLHDL", "LDLDIRECT" in the last 72 hours. Thyroid function studies No results for input(s): "TSH", "T4TOTAL", "T3FREE", "THYROIDAB" in the last 72 hours.  Invalid input(s): "FREET3" Anemia work up No results for input(s): "VITAMINB12", "FOLATE", "FERRITIN", "TIBC", "IRON", "RETICCTPCT" in the last 72 hours. Urinalysis    Component Value Date/Time   COLORURINE STRAW (A) 10/21/2022 1147   APPEARANCEUR HAZY (A) 10/21/2022 1147   LABSPEC 1.010 10/21/2022 1147   PHURINE 7.0 10/21/2022 1147   GLUCOSEU 150 (A) 10/21/2022 1147   HGBUR NEGATIVE 10/21/2022 1147   BILIRUBINUR NEGATIVE 10/21/2022 1147   BILIRUBINUR small (A) 10/14/2022 1132   BILIRUBINUR neg 07/19/2017 1116   KETONESUR 20 (A) 10/21/2022 1147   PROTEINUR 100 (A) 10/21/2022 1147   UROBILINOGEN 0.2 10/14/2022 1132   UROBILINOGEN 0.2 06/18/2017 1144   NITRITE NEGATIVE 10/21/2022 1147   LEUKOCYTESUR NEGATIVE 10/21/2022 1147   Sepsis Labs Recent Labs  Lab 10/22/22 0539 10/24/22 0535 10/25/22 0522 10/26/22 0508  WBC 20.6* 17.3* 19.1* 21.8*   Microbiology Recent Results (from the past 240 hour(s))  Respiratory (~20 pathogens) panel by PCR     Status:  None   Collection Time: 10/21/22  8:31 PM   Specimen: Nasopharyngeal Swab; Respiratory  Result Value Ref Range Status   Adenovirus NOT DETECTED NOT DETECTED Final   Coronavirus 229E NOT DETECTED NOT DETECTED Final    Comment: (NOTE) The Coronavirus on the Respiratory Panel, DOES NOT test for the novel  Coronavirus (  2019 nCoV)    Coronavirus HKU1 NOT DETECTED NOT DETECTED Final   Coronavirus NL63 NOT DETECTED NOT DETECTED Final   Coronavirus OC43 NOT DETECTED NOT DETECTED Final   Metapneumovirus NOT DETECTED NOT DETECTED Final   Rhinovirus / Enterovirus NOT DETECTED NOT DETECTED Final   Influenza A NOT DETECTED NOT DETECTED Final   Influenza B NOT DETECTED NOT DETECTED Final   Parainfluenza Virus 1 NOT DETECTED NOT DETECTED Final   Parainfluenza Virus 2 NOT DETECTED NOT DETECTED Final   Parainfluenza Virus 3 NOT DETECTED NOT DETECTED Final   Parainfluenza Virus 4 NOT DETECTED NOT DETECTED Final   Respiratory Syncytial Virus NOT DETECTED NOT DETECTED Final   Bordetella pertussis NOT DETECTED NOT DETECTED Final   Bordetella Parapertussis NOT DETECTED NOT DETECTED Final   Chlamydophila pneumoniae NOT DETECTED NOT DETECTED Final   Mycoplasma pneumoniae NOT DETECTED NOT DETECTED Final    Comment: Performed at [facility], [address] [place], [place] [address]     Time coordinating discharge: 35 minutes  SIGNED:   Glade Lloyd, MD  [facility] 10/26/2022, 11:15 AM

## 2022-10-26 NOTE — TOC Benefit Eligibility Note (Signed)
Pharmacy Patient Advocate Encounter  Insurance verification completed.    The patient is insured through [employer]  [place]   Ran test claim for Lantus Pen and the current 30 day co-pay is $4.00.   This test claim was processed through [facility]- copay amounts may vary at other pharmacies due to pharmacy/plan contracts, or as the patient moves through the different stages of their insurance plan.    Roland Earl, CPHT Pharmacy Patient Advocate Specialist [facility] Patient Advocate Team Direct Number: [phone]  Fax: [phone]

## 2022-10-26 NOTE — Inpatient Diabetes Management (Addendum)
Inpatient Diabetes Program Recommendations  AACE/ADA: New Consensus Statement on Inpatient Glycemic Control (2015)  Target Ranges:  Prepandial:   less than 140 mg/dL      Peak postprandial:   less than 180 mg/dL (1-2 hours)      Critically ill patients:  140 - 180 mg/dL    Latest Reference Range & Units 10/14/22 19:23  Hemoglobin A1C 4.8 - 5.6 % 11.8 (H)  291 mg/dl  (H): Data is abnormally high  Latest Reference Range & Units 10/25/22 07:14 10/25/22 11:28 10/25/22 16:51 10/25/22 21:37  Glucose-Capillary 70 - 99 mg/dL 829 (H) 562 (H) 130 (H) 143 (H)  (H): Data is abnormally high  Latest Reference Range & Units 10/26/22 07:32  Glucose-Capillary 70 - 99 mg/dL 865 (H)  (H): Data is abnormally high  Admit Acute exacerbation of diabetic gastroparesis   Diabetes history: DM2  Outpatient Diabetes meds: Glipizide 2.5 mg every day, Mounjaro 15 mg weekly on Friday  Current orders: Novolog Sensitive Correction Scale/ SSI (0-9 units) TID AC     Pt reports being followed by PCP and is currently not taking insulin at this time. States she was on insulin a couple of years ago, and blood sugars improved.  PCP: [facility]   Addendum 11:30am--Met w/ pt at bedside to review discharge DM med plan.  Reviewed with pt that MD wants pt to Stop the Glipizide, Continue the Mounjaro, and Start Lantus Insulin 15 units Daily.  Discussed with pt that if she has flare up of her gastroparesis symptoms again that she should call her PCP and ask about stopping the [facility] as Mounjaro can delay gastric emptying.  Also reviewed Lantus with pt--Explained what Lantus is, how it works, importance of timing, etc.  Re-Educated patient on insulin pen use at home.  Reviewed all steps of insulin pen including attachment of needle, 2-unit air shot, dialing up dose, giving injection, rotation of injection sites, removing needle, disposal of sharps, storage of unused insulin, disposal of insulin etc.  Patient able to  provide successful return demonstration and was comfortable as she has used insulin pens in the past.  Reviewed troubleshooting with insulin pen.  Also reviewed Signs/Symptoms of Hypoglycemia with patient and how to treat Hypoglycemia at home.       --Will follow patient during hospitalization--  Ambrose Finland RN, MSN, CDCES Diabetes Coordinator Inpatient Glycemic Control Team Team Pager: [phone] (8a-5p)

## 2022-11-07 ENCOUNTER — Ambulatory Visit (HOSPITAL_COMMUNITY)
Admission: EM | Admit: 2022-11-07 | Discharge: 2022-11-07 | Disposition: A | Discharge status: Home / Self Care | Payer: MEDICAID | Attending: Family Medicine | Admitting: Family Medicine

## 2022-11-07 ENCOUNTER — Ambulatory Visit (INDEPENDENT_AMBULATORY_CARE_PROVIDER_SITE_OTHER): Payer: MEDICAID

## 2022-11-07 ENCOUNTER — Other Ambulatory Visit: Payer: Self-pay

## 2022-11-07 ENCOUNTER — Encounter (HOSPITAL_COMMUNITY): Payer: Self-pay | Admitting: *Deleted

## 2022-11-07 DIAGNOSIS — K3184 Gastroparesis: Secondary | ICD-10-CM | POA: Diagnosis not present

## 2022-11-07 DIAGNOSIS — R112 Nausea with vomiting, unspecified: Secondary | ICD-10-CM

## 2022-11-07 LAB — POCT URINALYSIS DIP (MANUAL ENTRY)
Blood, UA: NEGATIVE
Glucose, UA: NEGATIVE mg/dL
Leukocytes, UA: NEGATIVE
Nitrite, UA: NEGATIVE
Protein Ur, POC: 100 mg/dL — AB
Spec Grav, UA: 1.025 (ref 1.010–1.025)
Urobilinogen, UA: 1 E.U./dL
pH, UA: 6 (ref 5.0–8.0)

## 2022-11-07 LAB — POCT FASTING CBG KUC MANUAL ENTRY: POCT Glucose (KUC): 170 mg/dL — AB (ref 70–99)

## 2022-11-07 MED ORDER — METOCLOPRAMIDE HCL 5 MG/ML IJ SOLN
INTRAMUSCULAR | Status: AC
  Filled 2022-11-07: qty 2

## 2022-11-07 MED ORDER — METOCLOPRAMIDE HCL 5 MG/ML IJ SOLN
10.0000 mg | Freq: Once | INTRAMUSCULAR | Status: AC
  Administered 2022-11-07: 10 mg via INTRAMUSCULAR

## 2022-11-07 NOTE — Discharge Instructions (Signed)
I am glad that you are feeling better after the medication.  Continue taking metoclopramide every 6 hours as previously prescribed.  Follow-up with your primary care tomorrow.  I recommend that you drink plenty of fluid and consider liquid meal supplements until you are able to slowly improve your diet to include soft foods.  If you have any recurrent nausea/vomiting despite this medication, recurrent abdominal pain, fever, difficulty passing bowel movement, difficulty passing gas you need to go to the emergency room immediately as we discussed.

## 2022-11-07 NOTE — ED Triage Notes (Signed)
Pt hx of gastroparesis and just came back from a cruise 2 days ago . Pt can not eat or drink anything. Pt was seen in ED 10-21-22 for same Sx's.

## 2022-11-07 NOTE — ED Provider Notes (Signed)
[facility]    CSN: [id number] Arrival date & time: 11/07/22  6213      History   Chief Complaint Chief Complaint  Patient presents with   Abdominal Pain   Emesis    HPI Catherine Pope is a 57 y.o. female.   Patient presents today with a weeklong history of decreased oral intake secondary to gastroparesis flare.  Reports that she was hospitalized for similar symptoms on 10/21/2022 and sent home with metoclopramide.  She reports that she is slowly advance her diet was feeling much better so she went on a previously scheduled cruise.  During that time she had a recurrence of symptoms and has had ongoing abdominal pain, nausea, vomiting, decreased oral intake since that time.  She has been able to drink some fluid but this has been very minimal.  She has not had any solid oral intake for over a week.  She has not been taking her medication regularly because she has not tolerated anything.  She reports her last bowel movement was a few days ago and she is passing gas.  Abdominal pain is rated 7 on a 0-10 pain scale, described as aching/gnawing in her stomach, no alleviating factors identified.  She denies any suspicious food intake, known sick contacts, antibiotic use.  She is scheduled to see GI but has not yet established with this group.    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   per pt tested a month ago/ no results yet   Hypertension    Migraine    Neck pain    Seasonal allergies    Sinusitis     Patient Active Problem List   Diagnosis Date Noted   Prolonged QT interval 10/23/2022   Metabolic acidosis 10/22/2022   Leukocytosis 10/22/2022   Diabetic gastroparesis (HCC) 10/21/2022   DKA (diabetic ketoacidosis) (HCC) 10/14/2022   Depression 10/14/2022   Right lumbar radiculopathy 06/03/2019    Uncontrolled type 2 diabetes mellitus with hyperglycemia, without long-term current use of insulin (HCC) 10/15/2018   LGSIL on Pap smear of cervix 12/28/2017   Lumbosacral disc herniation 09/12/2017   Hoarseness 04/30/2017   Laryngopharyngeal reflux (LPR) 04/30/2017   Perennial allergic rhinitis 04/30/2017   Chronic pansinusitis 04/30/2017   Cervical vertebral fusion 12/27/2016   Benign essential HTN 03/23/2016   Laryngitis, acute 03/23/2016   Neuropathy 03/23/2016   Tobacco dependence 03/23/2016   Morbid obesity (HCC) 11/26/2013   Migraine without aura 11/26/2013   Dyslipidemia 11/26/2013   Low back pain 03/23/2013    Past Surgical History:  Procedure Laterality Date   ABDOMINAL HYSTERECTOMY     per pt , in her 41's   ANTERIOR CERVICAL DECOMP/DISCECTOMY FUSION N/A 12/27/2016   Procedure: CERVICAL SIX-SEVEN ANTERIOR CERVICAL DECOMPRESSION/DISCECTOMY FUSION;  Surgeon: Tia Alert, MD;  Location: [facility] OR;  Service: Neurosurgery;  Laterality: N/A;   ANTERIOR CRUCIATE LIGAMENT REPAIR     right knee   BREAST EXCISIONAL BIOPSY Left over 20 years ago   benign   BREAST SURGERY     2 lumps removed at age 67, left breast/ benign   RADIOLOGY WITH ANESTHESIA Left 12/11/2017   Procedure: MRI cervical spine and left shoulder WITH ANESTHESIA;  Surgeon: Radiologist, Medication, MD;  Location: [facility] OR;  Service: Radiology;  Laterality:  Left;   SHOULDER ARTHROSCOPY     right shoulder    OB History     Gravida  2   Para  2   Term  2   Preterm      AB      Living  2      SAB      IAB      Ectopic      Multiple      Live Births  2            Home Medications    Prior to Admission medications   Medication Sig Start Date End Date Taking? Authorizing Provider  buPROPion (WELLBUTRIN XL) 300 MG 24 hr tablet Take 300 mg by mouth daily. 04/05/21  Yes McCoy, Fleet Contras, NP  cetirizine (ZYRTEC) 10 MG tablet Take 10 mg by mouth at bedtime. 04/20/22  Yes [provider]   hydrochlorothiazide (HYDRODIURIL) 25 MG tablet Take 1 tablet (25 mg total) by mouth daily. 10/26/22  Yes Glade Lloyd, MD  insulin glargine (LANTUS SOLOSTAR) 100 UNIT/ML Solostar Pen Inject 15 Units into the skin daily. 10/26/22  Yes Glade Lloyd, MD  Insulin Pen Needle (PEN NEEDLES 3/16") 31G X 5 MM MISC Lantus 15units daily 10/26/22  Yes Glade Lloyd, MD  Lancets (ONETOUCH ULTRASOFT) lancets Use as instructed 06/22/14  Yes Toy Cookey, MD  lisinopril (ZESTRIL) 40 MG tablet Take 1 tablet (40 mg total) by mouth daily. 10/27/22  Yes Glade Lloyd, MD  metoCLOPramide (REGLAN) 10 MG tablet Take 1 tablet (10 mg total) by mouth every 6 (six) hours. 10/26/22  Yes Glade Lloyd, MD  ondansetron (ZOFRAN ODT) 4 MG disintegrating tablet Take 1 tablet (4 mg total) by mouth every 8 (eight) hours as needed for nausea or vomiting. 10/26/22  Yes Glade Lloyd, MD  rosuvastatin (CRESTOR) 10 MG tablet Take 10 mg by mouth daily. 05/10/22  Yes [provider]  sodium bicarbonate 650 MG tablet Take 1 tablet (650 mg total) by mouth 3 (three) times daily. 10/26/22  Yes Glade Lloyd, MD  tirzepatide [facility]) 15 MG/0.5ML Pen Inject 15 mg into the skin once a week. Injection on Fridays   Yes [provider]  Vitamin D, Ergocalciferol, (DRISDOL) 1.25 MG (50000 UNIT) CAPS capsule Take 50,000 Units by mouth once a week. 10/17/22  Yes [provider]  Erythromycin 250 MG TBEC Take 1 tablet by mouth daily. 10/09/22   [provider]  Oxycodone HCl 10 MG TABS Take 10 mg by mouth in the morning, at noon, and at bedtime.    [provider]  traZODone (DESYREL) 100 MG tablet Take 1 tablet (100 mg total) by mouth at bedtime as needed for sleep. Patient taking differently: Take 100 mg by mouth at bedtime. 05/18/21 10/21/22  Cleotis Nipper, MD    Family History Family History  Problem Relation Age of Onset   Hypertension Mother    Heart Problems Father    Diabetes Father     Hypertension Other    Heart disease Other    Stroke Sister    Kidney disease Brother     Social History Social History   Tobacco Use   Smoking status: Every Day    Current packs/day: 0.50    Average packs/day: 0.5 packs/day for 15.0 years (7.5 ttl pk-yrs)    Types: Cigarettes   Smokeless tobacco: Never   Tobacco comments:    less than half pack a day.  Vaping Use   Vaping status: Never Used  Substance Use Topics   Alcohol use: No   Drug use: No     Allergies   Patient has no known allergies.   Review of Systems Review of Systems  Constitutional:  Positive for activity change. Negative for appetite change, fatigue and fever.  Respiratory:  Negative for cough and shortness of breath.   Cardiovascular:  Negative for chest pain.  Gastrointestinal:  Positive for abdominal pain, nausea and vomiting. Negative for blood in stool, constipation and diarrhea.  Neurological:  Negative for dizziness, light-headedness and headaches.     Physical Exam Triage Vital Signs ED Triage Vitals  Encounter Vitals Group     BP 11/07/22 1018 138/85     Systolic BP Percentile --      Diastolic BP Percentile --      Pulse Rate 11/07/22 1018 (!) 103     Resp 11/07/22 1018 20     Temp 11/07/22 1018 98.2 F (36.8 C)     Temp src --      SpO2 11/07/22 1018 94 %     Weight --      Height --      Head Circumference --      Peak Flow --      Pain Score 11/07/22 1015 7     Pain Loc --      Pain Education --      Exclude from Growth Chart --    No data found.  Updated Vital Signs BP 138/85   Pulse (!) 102   Temp 98.2 F (36.8 C)   Resp 20   SpO2 99%   Visual Acuity Right Eye Distance:   Left Eye Distance:   Bilateral Distance:    Right Eye Near:   Left Eye Near:    Bilateral Near:     Physical Exam Vitals reviewed.  Constitutional:      General: She is awake. She is not in acute distress.    Appearance: Normal appearance. She is well-developed. She is not ill-appearing.      Comments: Very pleasant female appears stated age in no acute distress sitting comfortably in exam room  HENT:     Head: Normocephalic and atraumatic.     Mouth/Throat:     Mouth: Mucous membranes are moist.  Cardiovascular:     Rate and Rhythm: Normal rate and regular rhythm.     Heart sounds: Normal heart sounds, S1 normal and S2 normal. No murmur heard. Pulmonary:     Effort: Pulmonary effort is normal.     Breath sounds: Normal breath sounds. No wheezing, rhonchi or rales.     Comments: Clear to auscultation bilaterally Abdominal:     General: Bowel sounds are normal.     Palpations: Abdomen is soft.     Tenderness: There is generalized abdominal tenderness. There is no right CVA tenderness, left CVA tenderness, guarding or rebound.     Comments: Tenderness palpation throughout abdomen.  No evidence of acute abdomen on physical exam.  Psychiatric:        Behavior: Behavior is cooperative.      UC Treatments / Results  Labs (all labs ordered are listed, but only abnormal results are displayed) Labs Reviewed  POCT URINALYSIS DIP (MANUAL ENTRY) - Abnormal; Notable for the following components:      Result Value   Clarity, UA cloudy (*)    Bilirubin, UA small (*)    Ketones, POC UA trace (5) (*)    Protein Ur, POC =100 (*)  All other components within normal limits  POCT FASTING CBG [facility] MANUAL ENTRY - Abnormal; Notable for the following components:   POCT Glucose ([facility]) 170 (*)    All other components within normal limits    EKG   Radiology DG Abdomen 1 View  Result Date: 11/07/2022 CLINICAL DATA:  Nausea and vomiting. Gastroparesis. Came back from a cruise today's ago. Was seen 10/21/2022 for same symptoms. EXAM: ABDOMEN - 1 VIEW COMPARISON:  Chest and abdominal radiographs 10/21/2022, CT abdomen and pelvis 12/15/2020 FINDINGS: There is stool within the rectosigmoid colon. No significant stool is seen within the rest of the colon. Overall paucity of small bowel gas.  No dilated loops of bowel are seen to indicate bowel obstruction. The superior aspect of the liver is not imaged but no portal venous gas is seen. Evaluation for pneumoperitoneum is limited by supine technique, however none is identified. Vascular phleboliths again overlie the pelvis and the space between the left L4 on L5 transverse processes, as on prior CT. 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 IMPRESSION: Nonobstructive bowel gas pattern. Electronically Signed   By: Neita Garnet M.D.   On: 11/07/2022 11:33    Procedures Procedures (including critical care time)  Medications Ordered in UC Medications  metoCLOPramide (REGLAN) injection 10 mg (10 mg Intramuscular Given 11/07/22 1103)    Initial Impression / Assessment and Plan / UC Course  I have reviewed the triage vital signs and the nursing notes.  Pertinent labs & imaging results that were available during my care of the patient were reviewed by me and considered in my medical decision making (see chart for details).     Patient is well-appearing, afebrile, nontoxic.  She is mildly tachycardic but I attribute this to pain as this is very close to her baseline.  We initially discussed going to the emergency room given severity of symptoms but patient preferred to attempt outpatient treatment.  Urine was obtained that did not show significant dehydration and only had trace ketones.  Her blood sugar was more appropriate at 170 so low suspicion for DKA.  KUB was obtained that showed nonobstructive bowel gas pattern.  We did discuss present utility of basic blood work but patient declined to have this done with [place] and instead will have it done with her primary care tomorrow.  She was given 10 mg of metoclopramide IM with significant improvement of symptoms; pain improved to a 3 on a 0 10 pain scale and she had no recurrent nausea or vomiting.  She was able to drink some fluid and eat a few teddy grams without  recurrent vomiting.  She has this medication at home and was encouraged to take this as previously prescribed.  We discussed that if she has any recurrent nausea/vomiting despite this medication or if she have severe abdominal pain she needs to go to the emergency room.  Recommended that she use protein supplements/meal replacement and/or blended food to help manage her symptoms and slowly advance diet as tolerated.  We discussed the importance of close follow-up with PCP and GI.  She intends to follow-up with her PCP tomorrow but will ensure that she is seen by either our clinic or her PCP within the next few days.  We discussed that she should have a low threshold for going to the ER to which she expressed understanding.  Strict return precautions given.  All questions were answered to patient satisfaction.  Final Clinical Impressions(s) / UC Diagnoses   Final diagnoses:  Gastroparesis  Nausea and vomiting, unspecified vomiting type     Discharge Instructions      I am glad that you are feeling better after the medication.  Continue taking metoclopramide every 6 hours as previously prescribed.  Follow-up with your primary care tomorrow.  I recommend that you drink plenty of fluid and consider liquid meal supplements until you are able to slowly improve your diet to include soft foods.  If you have any recurrent nausea/vomiting despite this medication, recurrent abdominal pain, fever, difficulty passing bowel movement, difficulty passing gas you need to go to the emergency room immediately as we discussed.     ED Prescriptions   None    PDMP not reviewed this encounter.   Jeani Hawking, PA-C 11/07/22 1216

## 2022-11-11 ENCOUNTER — Emergency Department (HOSPITAL_COMMUNITY)
Admission: EM | Admit: 2022-11-11 | Discharge: 2022-11-12 | Disposition: A | Discharge status: Home / Self Care | Payer: MEDICAID | Attending: Emergency Medicine | Admitting: Emergency Medicine

## 2022-11-11 ENCOUNTER — Emergency Department (HOSPITAL_COMMUNITY): Payer: MEDICAID

## 2022-11-11 ENCOUNTER — Other Ambulatory Visit: Payer: Self-pay

## 2022-11-11 DIAGNOSIS — R112 Nausea with vomiting, unspecified: Secondary | ICD-10-CM | POA: Diagnosis present

## 2022-11-11 DIAGNOSIS — Z794 Long term (current) use of insulin: Secondary | ICD-10-CM | POA: Diagnosis not present

## 2022-11-11 DIAGNOSIS — E1143 Type 2 diabetes mellitus with diabetic autonomic (poly)neuropathy: Secondary | ICD-10-CM | POA: Insufficient documentation

## 2022-11-11 DIAGNOSIS — Z20822 Contact with and (suspected) exposure to covid-19: Secondary | ICD-10-CM | POA: Insufficient documentation

## 2022-11-11 DIAGNOSIS — K3184 Gastroparesis: Secondary | ICD-10-CM

## 2022-11-11 LAB — CBC
HCT: 42.4 % (ref 36.0–46.0)
Hemoglobin: 14.1 g/dL (ref 12.0–15.0)
MCH: 29.2 pg (ref 26.0–34.0)
MCHC: 33.3 g/dL (ref 30.0–36.0)
MCV: 87.8 fL (ref 80.0–100.0)
Platelets: 281 10*3/uL (ref 150–400)
RBC: 4.83 MIL/uL (ref 3.87–5.11)
RDW: 13.5 % (ref 11.5–15.5)
WBC: 15.7 10*3/uL — ABNORMAL HIGH (ref 4.0–10.5)
nRBC: 0 % (ref 0.0–0.2)

## 2022-11-11 LAB — URINALYSIS, ROUTINE W REFLEX MICROSCOPIC
Bilirubin Urine: NEGATIVE
Glucose, UA: NEGATIVE mg/dL
Hgb urine dipstick: NEGATIVE
Ketones, ur: NEGATIVE mg/dL
Leukocytes,Ua: NEGATIVE
Nitrite: NEGATIVE
Protein, ur: 30 mg/dL — AB
Specific Gravity, Urine: 1.028 (ref 1.005–1.030)
pH: 5 (ref 5.0–8.0)

## 2022-11-11 LAB — COMPREHENSIVE METABOLIC PANEL
ALT: 29 U/L (ref 0–44)
AST: 29 U/L (ref 15–41)
Albumin: 3.7 g/dL (ref 3.5–5.0)
Alkaline Phosphatase: 64 U/L (ref 38–126)
Anion gap: 12 (ref 5–15)
BUN: 10 mg/dL (ref 6–20)
CO2: 22 mmol/L (ref 22–32)
Calcium: 9.2 mg/dL (ref 8.9–10.3)
Chloride: 100 mmol/L (ref 98–111)
Creatinine, Ser: 0.92 mg/dL (ref 0.44–1.00)
GFR, Estimated: >60 mL/min (ref >60)
Glucose, Bld: 131 mg/dL — ABNORMAL HIGH (ref 70–99)
Potassium: 3.5 mmol/L (ref 3.5–5.1)
Sodium: 134 mmol/L — ABNORMAL LOW (ref 135–145)
Total Bilirubin: 1.1 mg/dL (ref 0.3–1.2)
Total Protein: 7.8 g/dL (ref 6.5–8.1)

## 2022-11-11 LAB — LIPASE, BLOOD: Lipase: 69 U/L — ABNORMAL HIGH (ref 11–51)

## 2022-11-11 LAB — SARS CORONAVIRUS 2 BY RT PCR: SARS Coronavirus 2 by RT PCR: NEGATIVE

## 2022-11-11 MED ORDER — METOCLOPRAMIDE HCL 5 MG/ML IJ SOLN
10.0000 mg | Freq: Once | INTRAMUSCULAR | Status: AC
  Administered 2022-11-11: 10 mg via INTRAVENOUS
  Filled 2022-11-11: qty 2

## 2022-11-11 MED ORDER — HYDROMORPHONE HCL 1 MG/ML IJ SOLN
1.0000 mg | Freq: Once | INTRAMUSCULAR | Status: AC
  Administered 2022-11-11: 1 mg via INTRAVENOUS
  Filled 2022-11-11: qty 1

## 2022-11-11 MED ORDER — SENNOSIDES-DOCUSATE SODIUM 8.6-50 MG PO TABS: 1.0000 | ORAL_TABLET | Freq: Every day | ORAL | 0 refills | Status: AC | PRN

## 2022-11-11 MED ORDER — PROMETHAZINE HCL 25 MG RE SUPP: 25.0000 mg | Freq: Three times a day (TID) | RECTAL | 0 refills | Status: DC | PRN

## 2022-11-11 MED ORDER — SODIUM CHLORIDE 0.9 % IV BOLUS
1000.0000 mL | Freq: Once | INTRAVENOUS | Status: AC
  Administered 2022-11-11: 1000 mL via INTRAVENOUS

## 2022-11-11 MED ORDER — ONDANSETRON 4 MG PO TBDP: 4.0000 mg | ORAL_TABLET | Freq: Three times a day (TID) | ORAL | 0 refills | Status: DC | PRN

## 2022-11-11 MED ORDER — DROPERIDOL 2.5 MG/ML IJ SOLN
1.2500 mg | Freq: Once | INTRAMUSCULAR | Status: AC
  Administered 2022-11-11: 1.25 mg via INTRAVENOUS
  Filled 2022-11-11: qty 2

## 2022-11-11 MED ORDER — IOHEXOL 300 MG/ML  SOLN
100.0000 mL | Freq: Once | INTRAMUSCULAR | Status: AC | PRN
  Administered 2022-11-11: 100 mL via INTRAVENOUS

## 2022-11-11 NOTE — ED Triage Notes (Signed)
Pt arrived via POV. C/o abd pain and N/V for several days. Hx of gastroparesis.

## 2022-11-11 NOTE — Discharge Instructions (Addendum)
You can use Zofran as needed for nausea at home.  If you have more severe nausea or vomiting I prescribed you Phenergan as a rectal tablet, which can dissolve and often help treat bad nausea.  Please stick to a liquid diet for the next 2 days.

## 2022-11-11 NOTE — ED Notes (Signed)
Pt educated on DC instructions and verbalizes understanding , pt family taking her home, pt appears to be in NAD at time of Discharge

## 2022-11-11 NOTE — ED Provider Notes (Signed)
[place] EMERGENCY DEPARTMENT AT [facility] Provider Note   CSN: [id number] Arrival date & time: 11/11/22  1756     History  Chief Complaint  Patient presents with   Abdominal Pain   Nausea    Catherine Pope is a 57 y.o. female with history of gastroparesis presenting to the ED with complaint for nausea vomiting and constipation.  Patient reports she returned from a cruise about a week ago.  She said that about a day after getting back from the cruise she began having significant nausea and vomiting.  She last moved her bowels 5 days ago which is unusual for her.  She is not certain whether she is passing gas.  She reports cramping pain and a "knot" and bloating of the abdomen.  Denies history of abdominal surgery or bowel obstruction.  Reports history of gastroparesis.  Medical chart review shows the patient was admitted to the hospital approximately 2 weeks ago for suspected gastroparesis, with concern for diabetes complications and hyponatremia.  She had a nonanion gap metabolic acidosis at the time.  Her symptoms improved in the hospital  She denies marijuana use  HPI     Home Medications Prior to Admission medications   Medication Sig Start Date End Date Taking? Authorizing Provider  ondansetron (ZOFRAN-ODT) 4 MG disintegrating tablet Take 1 tablet (4 mg total) by mouth every 8 (eight) hours as needed for up to 15 doses for vomiting or nausea. 11/11/22  Yes , Kermit Balo, MD  promethazine (PHENERGAN) 25 MG suppository Place 1 suppository (25 mg total) rectally every 8 (eight) hours as needed for up to 6 doses for refractory nausea / vomiting. 11/11/22  Yes , Kermit Balo, MD  senna-docusate (SENOKOT-S) 8.6-50 MG tablet Take 1 tablet by mouth daily as needed for up to 15 days for mild constipation. 11/11/22 11/26/22 Yes Terald Sleeper, MD  buPROPion (WELLBUTRIN XL) 300 MG 24 hr tablet Take 300 mg by mouth daily. 04/05/21   Courtney Paris, NP  cetirizine  (ZYRTEC) 10 MG tablet Take 10 mg by mouth at bedtime. 04/20/22   [provider]  Erythromycin 250 MG TBEC Take 1 tablet by mouth daily. 10/09/22   [provider]  hydrochlorothiazide (HYDRODIURIL) 25 MG tablet Take 1 tablet (25 mg total) by mouth daily. 10/26/22   Glade Lloyd, MD  insulin glargine (LANTUS SOLOSTAR) 100 UNIT/ML Solostar Pen Inject 15 Units into the skin daily. 10/26/22   Glade Lloyd, MD  Insulin Pen Needle (PEN NEEDLES 3/16") 31G X 5 MM MISC Lantus 15units daily 10/26/22   Glade Lloyd, MD  Lancets [facility] ULTRASOFT) lancets Use as instructed 06/22/14   Toy Cookey, MD  lisinopril (ZESTRIL) 40 MG tablet Take 1 tablet (40 mg total) by mouth daily. 10/27/22   Glade Lloyd, MD  metoCLOPramide (REGLAN) 10 MG tablet Take 1 tablet (10 mg total) by mouth every 6 (six) hours. 10/26/22   Glade Lloyd, MD  ondansetron (ZOFRAN ODT) 4 MG disintegrating tablet Take 1 tablet (4 mg total) by mouth every 8 (eight) hours as needed for nausea or vomiting. 10/26/22   Glade Lloyd, MD  Oxycodone HCl 10 MG TABS Take 10 mg by mouth in the morning, at noon, and at bedtime.    [provider]  rosuvastatin (CRESTOR) 10 MG tablet Take 10 mg by mouth daily. 05/10/22   [provider]  sodium bicarbonate 650 MG tablet Take 1 tablet (650 mg total) by mouth 3 (three) times daily. 10/26/22  Glade Lloyd, MD  tirzepatide [facility]) 15 MG/0.5ML Pen Inject 15 mg into the skin once a week. Injection on Fridays    [provider]  traZODone (DESYREL) 100 MG tablet Take 1 tablet (100 mg total) by mouth at bedtime as needed for sleep. Patient taking differently: Take 100 mg by mouth at bedtime. 05/18/21 10/21/22  Arfeen, Phillips Grout, MD  Vitamin D, Ergocalciferol, (DRISDOL) 1.25 MG (50000 UNIT) CAPS capsule Take 50,000 Units by mouth once a week. 10/17/22   [provider]      Allergies    Patient has no known allergies.    Review of Systems   Review of  Systems  Physical Exam Updated Vital Signs BP (!) 147/81   Pulse (!) 110   Temp 97.9 F (36.6 C) (Oral)   Resp 17   Ht 5\' 6"  (1.676 m)   Wt 97.5 kg   SpO2 98%   BMI 34.70 kg/m  Physical Exam Constitutional:      General: She is not in acute distress. HENT:     Head: Normocephalic and atraumatic.  Eyes:     Conjunctiva/sclera: Conjunctivae normal.     Pupils: Pupils are equal, round, and reactive to light.  Cardiovascular:     Rate and Rhythm: Normal rate and regular rhythm.  Pulmonary:     Effort: Pulmonary effort is normal. No respiratory distress.  Abdominal:     General: There is no distension.     Tenderness: There is generalized abdominal tenderness.  Skin:    General: Skin is warm and dry.  Neurological:     General: No focal deficit present.     Mental Status: She is alert. Mental status is at baseline.  Psychiatric:        Mood and Affect: Mood normal.        Behavior: Behavior normal.     ED Results / Procedures / Treatments   Labs (all labs ordered are listed, but only abnormal results are displayed) Labs Reviewed  LIPASE, BLOOD - Abnormal; Notable for the following components:      Result Value   Lipase 69 (*)    All other components within normal limits  COMPREHENSIVE METABOLIC PANEL - Abnormal; Notable for the following components:   Sodium 134 (*)    Glucose, Bld 131 (*)    All other components within normal limits  CBC - Abnormal; Notable for the following components:   WBC 15.7 (*)    All other components within normal limits  URINALYSIS, ROUTINE W REFLEX MICROSCOPIC - Abnormal; Notable for the following components:   Color, Urine AMBER (*)    APPearance HAZY (*)    Protein, ur 30 (*)    Bacteria, UA FEW (*)    All other components within normal limits  SARS CORONAVIRUS 2 BY RT PCR    EKG EKG Interpretation Date/Time:  Saturday November 11 2022 19:27:56 EDT Ventricular Rate:  99 PR Interval:  128 QRS Duration:  83 QT  Interval:  420 QTC Calculation: 539 R Axis:   2  Text Interpretation: Sinus rhythm Abnormal R-wave progression, early transition Left ventricular hypertrophy Prolonged QT interval Confirmed by Alvester Chou [phone]) on 11/11/2022 7:36:37 PM  Radiology CT ABDOMEN PELVIS W CONTRAST  Result Date: 11/11/2022 CLINICAL DATA:  Concern for bowel obstruction. EXAM: CT ABDOMEN AND PELVIS WITH CONTRAST TECHNIQUE: Multidetector CT imaging of the abdomen and pelvis was performed using the standard protocol following bolus administration of intravenous contrast. RADIATION DOSE REDUCTION: This exam  was performed according to the departmental dose-optimization program which includes automated exposure control, adjustment of the mA and/or kV according to patient size and/or use of iterative reconstruction technique. CONTRAST:  OMNIPAQUE IOHEXOL 300 MG/ML  SOLN COMPARISON:  CT abdomen pelvis dated 10/14/2022. FINDINGS: Lower chest: The visualized lung bases are clear. No intra-abdominal free air or free fluid. Hepatobiliary: Probable fatty liver. No biliary dilatation. The gallbladder is unremarkable. Pancreas: Unremarkable. No pancreatic ductal dilatation or surrounding inflammatory changes. Spleen: Normal in size without focal abnormality. Adrenals/Urinary Tract: The adrenal glands are unremarkable. The kidneys, visualized ureters, and urinary bladder appear unremarkable. Stomach/Bowel: There is mild distal colonic diverticulosis without active inflammatory changes. There is no bowel obstruction or active inflammation. The appendix is normal. Vascular/Lymphatic: Mild aortoiliac atherosclerotic disease. The IVC is unremarkable. No portal venous gas. There is no adenopathy. Reproductive: Hysterectomy.  No adnexal masses Other: Anterior pelvic wall C-section scar. Musculoskeletal: Degenerative changes at L5-S1. No acute osseous pathology. IMPRESSION: 1. No acute intra-abdominal or pelvic pathology. No bowel obstruction.  Normal appendix. 2. Mild distal colonic diverticulosis. 3.  Aortic Atherosclerosis (ICD10-I70.0). Electronically Signed   By: Elgie Collard M.D.   On: 11/11/2022 20:48    Procedures Procedures    Medications Ordered in ED Medications  droperidol (INAPSINE) 2.5 MG/ML injection 1.25 mg (1.25 mg Intravenous Given 11/11/22 1950)  sodium chloride 0.9 % bolus 1,000 mL (1,000 mLs Intravenous New Bag/Given 11/11/22 1951)  iohexol (OMNIPAQUE) 300 MG/ML solution 100 mL (100 mLs Intravenous Contrast Given 11/11/22 2017)  HYDROmorphone (DILAUDID) injection 1 mg (1 mg Intravenous Given 11/11/22 2229)  metoCLOPramide (REGLAN) injection 10 mg (10 mg Intravenous Given 11/11/22 2229)    ED Course/ Medical Decision Making/ A&P Clinical Course as of 11/11/22 2313  Sat Nov 11, 2022  2310 Patient's labs reviewed.  There are some amount of protein in the urine.  No evidence of infection.  COVID is negative.  No significant electrolyte derangement.  The patient has a chronic leukocytosis, which is near baseline levels now at 15.7.  She was feeling better and able to tolerate oral intake and fluids.  I do think she is stable for discharge at this time [MT]    Clinical Course User Index [MT] , Kermit Balo, MD                                 Medical Decision Making Amount and/or Complexity of Data Reviewed Labs: ordered. Radiology: ordered. ECG/medicine tests: ordered.  Risk OTC drugs. Prescription drug management.   This patient presents to the ED with concern for abdominal bloating, nausea vomiting, constipation. This involves an extensive number of treatment options, and is a complaint that carries with it a high risk of complications and morbidity.  The differential diagnosis includes gastroparesis versus ileus versus bowel obstruction versus other  Co-morbidities that complicate the patient evaluation: History of diabetes at high risk of diabetic complications  External records from outside  source obtained and reviewed including hospital discharge summary from July 2024  I ordered and personally interpreted labs.  The pertinent results include: No emergent findings.  Chronic leukocytosis  I ordered imaging studies including CT abdomen pelvis I independently visualized and interpreted imaging which showed no emergent findings I agree with the radiologist interpretation  The patient was maintained on a cardiac monitor.  I personally viewed and interpreted the cardiac monitored which showed an underlying rhythm of: Sinus rhythm and sinus tachycardia  Per my interpretation the patient's ECG shows mildly prolonged QTc, sinus rhythm  I ordered medication including IV fluids, IV Reglan, IV droperidol for gastroparesis  I have reviewed the patients home medicines and have made adjustments as needed  Test Considered: Low suspicion for mesenteric ischemia  After the interventions noted above, I reevaluated the patient and found that they have: improved   Dispostion:  After consideration of the diagnostic results and the patients response to treatment, I feel that the patent would benefit from close outpatient follow-up.         Final Clinical Impression(s) / ED Diagnoses Final diagnoses:  Gastroparesis    Rx / DC Orders ED Discharge Orders          Ordered    ondansetron (ZOFRAN-ODT) 4 MG disintegrating tablet  Every 8 hours PRN        11/11/22 2310    promethazine (PHENERGAN) 25 MG suppository  Every 8 hours PRN        11/11/22 2310    senna-docusate (SENOKOT-S) 8.6-50 MG tablet  Daily PRN        11/11/22 2312              Terald Sleeper, MD 11/11/22 2313

## 2022-11-19 ENCOUNTER — Encounter (HOSPITAL_COMMUNITY): Payer: Self-pay

## 2022-11-19 ENCOUNTER — Emergency Department (HOSPITAL_COMMUNITY)
Admission: EM | Admit: 2022-11-19 | Discharge: 2022-11-19 | Disposition: A | Discharge status: Home / Self Care | Payer: MEDICAID | Attending: Emergency Medicine | Admitting: Emergency Medicine

## 2022-11-19 ENCOUNTER — Emergency Department (HOSPITAL_COMMUNITY): Payer: MEDICAID

## 2022-11-19 DIAGNOSIS — Z794 Long term (current) use of insulin: Secondary | ICD-10-CM | POA: Diagnosis not present

## 2022-11-19 DIAGNOSIS — R1084 Generalized abdominal pain: Secondary | ICD-10-CM | POA: Insufficient documentation

## 2022-11-19 DIAGNOSIS — E119 Type 2 diabetes mellitus without complications: Secondary | ICD-10-CM | POA: Diagnosis not present

## 2022-11-19 LAB — TROPONIN I (HIGH SENSITIVITY)
Troponin I (High Sensitivity): 7 ng/L (ref <18)
Troponin I (High Sensitivity): 7 ng/L (ref <18)

## 2022-11-19 LAB — URINALYSIS, W/ REFLEX TO CULTURE (INFECTION SUSPECTED)
Bilirubin Urine: NEGATIVE
Glucose, UA: 50 mg/dL — AB
Hgb urine dipstick: NEGATIVE
Ketones, ur: 5 mg/dL — AB
Leukocytes,Ua: NEGATIVE
Nitrite: NEGATIVE
Protein, ur: 30 mg/dL — AB
Specific Gravity, Urine: 1.025 (ref 1.005–1.030)
pH: 5 (ref 5.0–8.0)

## 2022-11-19 LAB — CBC WITH DIFFERENTIAL/PLATELET
Abs Immature Granulocytes: 0.1 10*3/uL — ABNORMAL HIGH (ref 0.00–0.07)
Basophils Absolute: 0.1 10*3/uL (ref 0.0–0.1)
Basophils Relative: 0 %
Eosinophils Absolute: 0.1 10*3/uL (ref 0.0–0.5)
Eosinophils Relative: 1 %
HCT: 45.4 % (ref 36.0–46.0)
Hemoglobin: 15.5 g/dL — ABNORMAL HIGH (ref 12.0–15.0)
Immature Granulocytes: 1 %
Lymphocytes Relative: 29 %
Lymphs Abs: 6.1 10*3/uL — ABNORMAL HIGH (ref 0.7–4.0)
MCH: 29.2 pg (ref 26.0–34.0)
MCHC: 34.1 g/dL (ref 30.0–36.0)
MCV: 85.7 fL (ref 80.0–100.0)
Monocytes Absolute: 1.5 10*3/uL — ABNORMAL HIGH (ref 0.1–1.0)
Monocytes Relative: 7 %
Neutro Abs: 13 10*3/uL — ABNORMAL HIGH (ref 1.7–7.7)
Neutrophils Relative %: 62 %
Platelets: 384 10*3/uL (ref 150–400)
RBC: 5.3 MIL/uL — ABNORMAL HIGH (ref 3.87–5.11)
RDW: 13.6 % (ref 11.5–15.5)
WBC: 21 10*3/uL — ABNORMAL HIGH (ref 4.0–10.5)
nRBC: 0 % (ref 0.0–0.2)

## 2022-11-19 LAB — COMPREHENSIVE METABOLIC PANEL
ALT: 28 U/L (ref 0–44)
AST: 24 U/L (ref 15–41)
Albumin: 3.8 g/dL (ref 3.5–5.0)
Alkaline Phosphatase: 68 U/L (ref 38–126)
Anion gap: 14 (ref 5–15)
BUN: 25 mg/dL — ABNORMAL HIGH (ref 6–20)
CO2: 21 mmol/L — ABNORMAL LOW (ref 22–32)
Calcium: 9.1 mg/dL (ref 8.9–10.3)
Chloride: 100 mmol/L (ref 98–111)
Creatinine, Ser: 1.19 mg/dL — ABNORMAL HIGH (ref 0.44–1.00)
GFR, Estimated: 54 mL/min — ABNORMAL LOW (ref >60)
Glucose, Bld: 163 mg/dL — ABNORMAL HIGH (ref 70–99)
Potassium: 3.2 mmol/L — ABNORMAL LOW (ref 3.5–5.1)
Sodium: 135 mmol/L (ref 135–145)
Total Bilirubin: 0.7 mg/dL (ref 0.3–1.2)
Total Protein: 8.1 g/dL (ref 6.5–8.1)

## 2022-11-19 LAB — LIPASE, BLOOD: Lipase: 42 U/L (ref 11–51)

## 2022-11-19 MED ORDER — LACTATED RINGERS IV BOLUS
1000.0000 mL | Freq: Once | INTRAVENOUS | Status: AC
  Administered 2022-11-19: 1000 mL via INTRAVENOUS

## 2022-11-19 MED ORDER — DROPERIDOL 2.5 MG/ML IJ SOLN
2.5000 mg | Freq: Once | INTRAMUSCULAR | Status: AC
  Administered 2022-11-19: 2.5 mg via INTRAVENOUS
  Filled 2022-11-19: qty 2

## 2022-11-19 NOTE — Discharge Instructions (Addendum)
I think that your symptoms could be related to your gallbladder.  I have included the telephone number for a general surgeon that you can make an appointment with for further evaluation.  You can give them a call tomorrow to set up a follow-up appointment.  Continue taking all medications as prescribed.

## 2022-11-19 NOTE — ED Provider Notes (Signed)
[place] EMERGENCY DEPARTMENT AT [facility] Provider Note   CSN: [id number] Arrival date & time: 11/19/22  1411     History  Chief Complaint  Patient presents with   Abdominal Pain    Fennie Panetta is a 57 y.o. female.  This is a 57 year old female who is here today for chronic vomiting.  Patient has had the symptoms for 5 weeks, has worsened over the last few weeks.  Patient says that every time she eats, she begins to vomit.  She has a past medical history significant for diabetes, diagnosis of diabetic gastroparesis.  She has been compliant with her Reglan at home.   Abdominal Pain      Home Medications Prior to Admission medications   Medication Sig Start Date End Date Taking? Authorizing Provider  buPROPion (WELLBUTRIN XL) 300 MG 24 hr tablet Take 300 mg by mouth daily. 04/05/21   Courtney Paris, NP  cetirizine (ZYRTEC) 10 MG tablet Take 10 mg by mouth at bedtime. 04/20/22   [provider]  Erythromycin 250 MG TBEC Take 1 tablet by mouth daily. 10/09/22   [provider]  hydrochlorothiazide (HYDRODIURIL) 25 MG tablet Take 1 tablet (25 mg total) by mouth daily. 10/26/22   Glade Lloyd, MD  insulin glargine (LANTUS SOLOSTAR) 100 UNIT/ML Solostar Pen Inject 15 Units into the skin daily. 10/26/22   Glade Lloyd, MD  Insulin Pen Needle (PEN NEEDLES 3/16") 31G X 5 MM MISC Lantus 15units daily 10/26/22   Glade Lloyd, MD  Lancets [facility] ULTRASOFT) lancets Use as instructed 06/22/14   Toy Cookey, MD  lisinopril (ZESTRIL) 40 MG tablet Take 1 tablet (40 mg total) by mouth daily. 10/27/22   Glade Lloyd, MD  metoCLOPramide (REGLAN) 10 MG tablet Take 1 tablet (10 mg total) by mouth every 6 (six) hours. 10/26/22   Glade Lloyd, MD  ondansetron (ZOFRAN ODT) 4 MG disintegrating tablet Take 1 tablet (4 mg total) by mouth every 8 (eight) hours as needed for nausea or vomiting. 10/26/22   Glade Lloyd, MD  ondansetron (ZOFRAN-ODT) 4 MG  disintegrating tablet Take 1 tablet (4 mg total) by mouth every 8 (eight) hours as needed for up to 15 doses for vomiting or nausea. 11/11/22   Terald Sleeper, MD  Oxycodone HCl 10 MG TABS Take 10 mg by mouth in the morning, at noon, and at bedtime.    [provider]  promethazine (PHENERGAN) 25 MG suppository Place 1 suppository (25 mg total) rectally every 8 (eight) hours as needed for up to 6 doses for refractory nausea / vomiting. 11/11/22   Terald Sleeper, MD  rosuvastatin (CRESTOR) 10 MG tablet Take 10 mg by mouth daily. 05/10/22   [provider]  senna-docusate (SENOKOT-S) 8.6-50 MG tablet Take 1 tablet by mouth daily as needed for up to 15 days for mild constipation. 11/11/22 11/26/22  Terald Sleeper, MD  sodium bicarbonate 650 MG tablet Take 1 tablet (650 mg total) by mouth 3 (three) times daily. 10/26/22   Glade Lloyd, MD  tirzepatide [facility]) 15 MG/0.5ML Pen Inject 15 mg into the skin once a week. Injection on Fridays    [provider]  traZODone (DESYREL) 100 MG tablet Take 1 tablet (100 mg total) by mouth at bedtime as needed for sleep. Patient taking differently: Take 100 mg by mouth at bedtime. 05/18/21 10/21/22  Arfeen, Phillips Grout, MD  Vitamin D, Ergocalciferol, (DRISDOL) 1.25 MG (50000 UNIT) CAPS capsule Take 50,000 Units by mouth once a  week. 10/17/22   [provider]      Allergies    Patient has no known allergies.    Review of Systems   Review of Systems  Gastrointestinal:  Positive for abdominal pain.    Physical Exam Updated Vital Signs BP 133/77   Pulse 99   Temp 98.7 F (37.1 C)   Resp 15   SpO2 100%  Physical Exam HENT:     Head: Normocephalic.  Cardiovascular:     Rate and Rhythm: Normal rate.  Pulmonary:     Effort: Pulmonary effort is normal.  Abdominal:     General: There is no distension.     Palpations: Abdomen is soft.     Tenderness: There is no abdominal tenderness.  Neurological:     Mental Status: She  is alert.     ED Results / Procedures / Treatments   Labs (all labs ordered are listed, but only abnormal results are displayed) Labs Reviewed  CBC WITH DIFFERENTIAL/PLATELET - Abnormal; Notable for the following components:      Result Value   WBC 21.0 (*)    RBC 5.30 (*)    Hemoglobin 15.5 (*)    Neutro Abs 13.0 (*)    Lymphs Abs 6.1 (*)    Monocytes Absolute 1.5 (*)    Abs Immature Granulocytes 0.10 (*)    All other components within normal limits  COMPREHENSIVE METABOLIC PANEL - Abnormal; Notable for the following components:   Potassium 3.2 (*)    CO2 21 (*)    Glucose, Bld 163 (*)    BUN 25 (*)    Creatinine, Ser 1.19 (*)    GFR, Estimated 54 (*)    All other components within normal limits  URINALYSIS, W/ REFLEX TO CULTURE (INFECTION SUSPECTED) - Abnormal; Notable for the following components:   APPearance HAZY (*)    Glucose, UA 50 (*)    Ketones, ur 5 (*)    Protein, ur 30 (*)    Bacteria, UA FEW (*)    All other components within normal limits  URINE CULTURE  LIPASE, BLOOD  TROPONIN I (HIGH SENSITIVITY)  TROPONIN I (HIGH SENSITIVITY)    EKG None  Radiology US Abdomen Limited RUQ (LIVER/GB)  Result Date: 11/19/2022 CLINICAL DATA:  Vomiting, right upper quadrant pain for 5 weeks EXAM: ULTRASOUND ABDOMEN LIMITED RIGHT UPPER QUADRANT COMPARISON:  None Available. FINDINGS: Gallbladder: Sludge and tiny stones in the gallbladder. No sonographic Murphy sign noted by sonographer. Common bile duct: Diameter: 0.3 cm Liver: Focal fatty sparing adjacent to the gallbladder fossa. Increased parenchymal echogenicity. Portal vein is patent on color Doppler imaging with normal direction of blood flow towards the liver. Other: None. IMPRESSION: 1. Sludge and tiny stones in the gallbladder. No sonographic Murphy sign noted by sonographer. 2. Hepatic steatosis. Electronically Signed   By: Jearld Lesch M.D.   On: 11/19/2022 17:12    Procedures Procedures    Medications Ordered  in ED Medications  droperidol (INAPSINE) 2.5 MG/ML injection 2.5 mg (2.5 mg Intravenous Given 11/19/22 1651)  lactated ringers bolus 1,000 mL (1,000 mLs Intravenous New Bag/Given 11/19/22 1646)    ED Course/ Medical Decision Making/ A&P                                 Medical Decision Making 57 year old female here today with vomiting.  Differential diagnoses include gastroparesis, biliary dyskinesia, pancreatitis, less likely obstruction.  Plan #  with the duration of the patient's symptoms, is likely chronic in nature.  She has had multiple visits for similar, has been diagnosed with diabetic gastroparesis.  Will try to fill in the gaps on her Workup, We Will Perform a Cardiac Workup Today, Will Obtain Imaging of the Patient's Right Upper Quadrant.  Do Not Believe Patient Requires Repeat CT Imaging As She Had One Done 1 Week Ago.  Droperidol ordered.  Reassessment-patient feeling quite a bit better, has been tolerating p.o. here in the emergency department.  Her ultrasound does show scattered sludge and stones.  Believe this may be biliary dyskinesia.  Will refer the patient to general surgery for HIDA scan, evaluation.  Patient has an elevated white count, believe this is likely reactive.  She is afebrile, not tachycardic here.  There is no infection in the urine.  Abdomen soft.  Will discharge patient, have her follow-up with her PCP, general surgery.    Amount and/or Complexity of Data Reviewed Labs: ordered. Radiology: ordered.  Risk Prescription drug management.           Final Clinical Impression(s) / ED Diagnoses Final diagnoses:  Generalized abdominal pain    Rx / DC Orders ED Discharge Orders     None         Arletha Pili, DO 11/19/22 2043

## 2022-11-19 NOTE — ED Triage Notes (Signed)
Pt arrived via POV, c/o abd pain, n/v. States she has been seen several times for the same. States no definitive dx.

## 2022-11-21 LAB — URINE CULTURE

## 2022-11-30 ENCOUNTER — Other Ambulatory Visit: Payer: Self-pay | Admitting: General Surgery

## 2022-12-07 NOTE — Progress Notes (Addendum)
Surgical Instructions    Your procedure is scheduled on Sept 9,2024.  Report to [facility] Main Entrance "A" at 10:45 A.M., then check in with the Admitting office.  Call this number if you have problems the morning of surgery:  [phone]  If you have any questions prior to your surgery date call [phone]: Open Monday-Friday 8am-4pm If you experience any cold or flu symptoms such as cough, fever, chills, shortness of breath, etc. between now and your scheduled surgery, please notify us at the above number.     Remember:  Do not eat after midnight the night before your surgery  You may drink clear liquids until 9:45 A.M. the morning of your surgery.   Clear liquids allowed are: Water, Non-Citrus Juices (without pulp), Carbonated Beverages, Clear Tea, Black Coffee Only (NO MILK, CREAM OR POWDERED CREAMER of any kind), and Gatorade. Patient Instructions  The night before surgery:  No food after midnight. ONLY clear liquids after midnight  The day of surgery (if you do NOT have diabetes):  Drink ONE (1) Pre-Surgery Clear Ensure by  the morning of surgery. Drink in one sitting. Do not sip.  This drink was given to you during your hospital  pre-op appointment visit.  Nothing else to drink after completing the  Pre-Surgery Clear Ensure.  The day of surgery (if you have diabetes): Drink ONE (1) 12 oz G2 given to you in your pre admission testing appointment by  the morning of surgery. Drink in one sitting. Do not sip.  This drink was given to you during your hospital  pre-op appointment visit.  Nothing else to drink after completing the  12 oz bottle of G2.         If you have questions, please contact your surgeon's office.        Take these medicines the morning of surgery with A SIP OF WATER  buPROPion (WELLBUTRIN XL) buPROPion (WELLBUTRIN XL)  cetirizine (ZYRTEC)  famotidine (PEPCID)  Oxycodone HCl  tizanidine (ZANAFLEX)    IF needed fluticasone (FLONASE)   metoCLOPramide (REGLAN)  ondansetron (ZOFRAN-ODT)  PROMETHEGAN 50 MG suppository     WHAT DO I DO ABOUT MY DIABETES MEDICATION?   Do not take oral diabetes medicines (pills) the morning of surgery.  THE NIGHT BEFORE SURGERY, take 1/2 units of 15_ insulin LANTUS       The day of surgery, do not take other diabetes injectables, including Byetta (exenatide), Bydureon (exenatide ER), Victoza (liraglutide), or Trulicity (dulaglutide). tirzepatide [facility]) Last dose [id number]  If your CBG is greater than 220 mg/dL, you may take  of your sliding scale (correction) dose of insulin.   HOW TO MANAGE YOUR DIABETES BEFORE AND AFTER SURGERY  Why is it important to control my blood sugar before and after surgery? Improving blood sugar levels before and after surgery helps healing and can limit problems. A way of improving blood sugar control is eating a healthy diet by:  Eating less sugar and carbohydrates  Increasing activity/exercise  Talking with your doctor about reaching your blood sugar goals High blood sugars (greater than 180 mg/dL) can raise your risk of infections and slow your recovery, so you will need to focus on controlling your diabetes during the weeks before surgery. Make sure that the doctor who takes care of your diabetes knows about your planned surgery including the date and location.  How do I manage my blood sugar before surgery? Check your blood sugar at least 4 times a day, starting 2 days  before surgery, to make sure that the level is not too high or low.  Check your blood sugar the morning of your surgery when you wake up and every 2 hours until you get to the Short Stay unit.  If your blood sugar is less than 70 mg/dL, you will need to treat for low blood sugar: Do not take insulin. Treat a low blood sugar (less than 70 mg/dL) with  cup of clear juice (cranberry or apple), 4 glucose tablets, OR glucose gel. Recheck blood sugar in 15 minutes after treatment (to  make sure it is greater than 70 mg/dL). If your blood sugar is not greater than 70 mg/dL on recheck, call [phone] for further instructions. Report your blood sugar to the short stay nurse when you get to Short Stay.  If you are admitted to the hospital after surgery: Your blood sugar will be checked by the staff and you will probably be given insulin after surgery (instead of oral diabetes medicines) to make sure you have good blood sugar levels. The goal for blood sugar control after surgery is 80-180 mg/dL.  As of today, STOP taking any Aspirin (unless otherwise instructed by your surgeon) Aleve, Naproxen, Ibuprofen, Motrin, Advil, Goody's, BC's, all herbal medications, fish oil, and all vitamins.                     Do NOT Smoke (Tobacco/Vaping) for 24 hours prior to your procedure.  If you use a CPAP at night, you may bring your mask/headgear for your overnight stay.   Contacts, glasses, piercing's, hearing aid's, dentures or partials may not be worn into surgery, please bring cases for these belongings.    For patients admitted to the hospital, discharge time will be determined by your treatment team.   Patients discharged the day of surgery will not be allowed to drive home, and someone needs to stay with them for 24 hours.  SURGICAL WAITING ROOM VISITATION Patients having surgery or a procedure may have no more than 2 support people in the waiting area - these visitors may rotate.   Children under the age of 10 must have an adult with them who is not the patient. If the patient needs to stay at the hospital during part of their recovery, the visitor guidelines for inpatient rooms apply. Pre-op nurse will coordinate an appropriate time for 1 support person to accompany patient in pre-op.  This support person may not rotate.   Please refer to the [facility] website for the visitor guidelines for Inpatients (after your surgery is over and you are in a regular room).    Special  instructions:   - Preparing For Surgery  Before surgery, you can play an important role. Because skin is not sterile, your skin needs to be as free of germs as possible. You can reduce the number of germs on your skin by washing with CHG (chlorahexidine gluconate) Soap before surgery.  CHG is an antiseptic cleaner which kills germs and bonds with the skin to continue killing germs even after washing.    Oral Hygiene is also important to reduce your risk of infection.  Remember - BRUSH YOUR TEETH THE MORNING OF SURGERY WITH YOUR REGULAR TOOTHPASTE  Please do not use if you have an allergy to CHG or antibacterial soaps. If your skin becomes reddened/irritated stop using the CHG.  Do not shave (including legs and underarms) for at least 48 hours prior to first CHG shower. It is OK  to shave your face.  Please follow these instructions carefully.   Shower the NIGHT BEFORE SURGERY and the MORNING OF SURGERY  If you chose to wash your hair, wash your hair first as usual with your normal shampoo.  After you shampoo, rinse your hair and body thoroughly to remove the shampoo.  Use CHG Soap as you would any other liquid soap. You can apply CHG directly to the skin and wash gently with a scrungie or a clean washcloth.   Apply the CHG Soap to your body ONLY FROM THE NECK DOWN.  Do not use on open wounds or open sores. Avoid contact with your eyes, ears, mouth and genitals (private parts). Wash Face and genitals (private parts)  with your normal soap.   Wash thoroughly, paying special attention to the area where your surgery will be performed.  Thoroughly rinse your body with warm water from the neck down.  DO NOT shower/wash with your normal soap after using and rinsing off the CHG Soap.  Pat yourself dry with a CLEAN TOWEL.  Wear CLEAN PAJAMAS to bed the night before surgery  Place CLEAN SHEETS on your bed the night before your surgery  DO NOT SLEEP WITH PETS.   Day of  Surgery: Take a shower with CHG soap. Do not wear jewelry or makeup Do not wear lotions, powders, perfumes/colognes, or deodorant. Do not shave 48 hours prior to surgery.  Men may shave face and neck. Do not bring valuables to the hospital.  [facility] is not responsible for any belongings or valuables. Do not wear nail polish, gel polish, artificial nails, or any other type of covering on natural nails (fingers and toes) If you have artificial nails or gel coating that need to be removed by a nail salon, please have this removed prior to surgery. Artificial nails or gel coating may interfere with anesthesia's ability to adequately monitor your vital signs. Wear Clean/Comfortable clothing the morning of surgery Remember to brush your teeth WITH YOUR REGULAR TOOTHPASTE.   Please read over the following fact sheets that you were given.    If you received a COVID test during your pre-op visit  it is requested that you wear a mask when out in public, stay away from anyone that may not be feeling well and notify your surgeon if you develop symptoms. If you have been in contact with anyone that has tested positive in the last 10 days please notify you surgeon.

## 2022-12-08 ENCOUNTER — Encounter (HOSPITAL_COMMUNITY): Payer: Self-pay

## 2022-12-08 ENCOUNTER — Encounter (HOSPITAL_COMMUNITY)
Admission: RE | Admit: 2022-12-08 | Discharge: 2022-12-08 | Disposition: A | Discharge status: Home / Self Care | Payer: MEDICAID | Source: Ambulatory Visit | Attending: General Surgery

## 2022-12-08 ENCOUNTER — Other Ambulatory Visit: Payer: Self-pay

## 2022-12-08 VITALS — BP 155/74 | HR 58 | Temp 98.0°F | Resp 17 | Ht 66.0 in | Wt 209.1 lb

## 2022-12-08 DIAGNOSIS — Z794 Long term (current) use of insulin: Secondary | ICD-10-CM | POA: Diagnosis not present

## 2022-12-08 DIAGNOSIS — I10 Essential (primary) hypertension: Secondary | ICD-10-CM | POA: Diagnosis not present

## 2022-12-08 DIAGNOSIS — Z01812 Encounter for preprocedural laboratory examination: Secondary | ICD-10-CM | POA: Insufficient documentation

## 2022-12-08 DIAGNOSIS — E119 Type 2 diabetes mellitus without complications: Secondary | ICD-10-CM | POA: Insufficient documentation

## 2022-12-08 DIAGNOSIS — Z7985 Long-term (current) use of injectable non-insulin antidiabetic drugs: Secondary | ICD-10-CM | POA: Insufficient documentation

## 2022-12-08 DIAGNOSIS — K8020 Calculus of gallbladder without cholecystitis without obstruction: Secondary | ICD-10-CM | POA: Insufficient documentation

## 2022-12-08 DIAGNOSIS — Z87891 Personal history of nicotine dependence: Secondary | ICD-10-CM | POA: Insufficient documentation

## 2022-12-08 DIAGNOSIS — Z01818 Encounter for other preprocedural examination: Secondary | ICD-10-CM

## 2022-12-08 HISTORY — DX: Gastro-esophageal reflux disease without esophagitis: K21.9

## 2022-12-08 LAB — CBC
HCT: 35.8 % — ABNORMAL LOW (ref 36.0–46.0)
Hemoglobin: 11.8 g/dL — ABNORMAL LOW (ref 12.0–15.0)
MCH: 28.8 pg (ref 26.0–34.0)
MCHC: 33 g/dL (ref 30.0–36.0)
MCV: 87.3 fL (ref 80.0–100.0)
Platelets: 258 10*3/uL (ref 150–400)
RBC: 4.1 MIL/uL (ref 3.87–5.11)
RDW: 13.7 % (ref 11.5–15.5)
WBC: 10.2 10*3/uL (ref 4.0–10.5)
nRBC: 0 % (ref 0.0–0.2)

## 2022-12-08 LAB — GLUCOSE, CAPILLARY: Glucose-Capillary: 397 mg/dL — ABNORMAL HIGH (ref 70–99)

## 2022-12-08 LAB — BASIC METABOLIC PANEL
Anion gap: 9 (ref 5–15)
BUN: <5 mg/dL — ABNORMAL LOW (ref 6–20)
CO2: 23 mmol/L (ref 22–32)
Calcium: 7.8 mg/dL — ABNORMAL LOW (ref 8.9–10.3)
Chloride: 102 mmol/L (ref 98–111)
Creatinine, Ser: 0.72 mg/dL (ref 0.44–1.00)
GFR, Estimated: >60 mL/min (ref >60)
Glucose, Bld: 390 mg/dL — ABNORMAL HIGH (ref 70–99)
Potassium: 3.2 mmol/L — ABNORMAL LOW (ref 3.5–5.1)
Sodium: 134 mmol/L — ABNORMAL LOW (ref 135–145)

## 2022-12-08 NOTE — Progress Notes (Signed)
Surgical Instructions    Your procedure is scheduled on Monday Sept 9,2024.  Report to [facility] Main Entrance "A" at 10:45 A.M., then check in with the Admitting office.  Call this number if you have problems the morning of surgery:  [phone]  If you have any questions prior to your surgery date call [phone]: Open Monday-Friday 8am-4pm If you experience any cold or flu symptoms such as cough, fever, chills, shortness of breath, etc. between now and your scheduled surgery, please notify us at the above number.     Remember:  Do not eat after midnight the night before your surgery.  You may drink clear liquids until 9:45 A.M. the morning of your surgery.   Clear liquids allowed are: Water, Non-Citrus Juices (without pulp), Carbonated Beverages, Clear Tea, Black Coffee Only (NO MILK, CREAM OR POWDERED CREAMER of any kind), and Gatorade.  Patient Instructions  The night before surgery:  No food after midnight. ONLY clear liquids after midnight  The day of surgery (if you have diabetes): Drink ONE (1) 12 oz G2 given to you in your pre admission testing appointment by  the morning of surgery. Drink in one sitting. Do not sip.  This drink was given to you during your hospital  pre-op appointment visit.  Nothing else to drink after completing the  12 oz bottle of G2.         If you have questions, please contact your surgeon's office.    Take these medicines the morning of surgery with A SIP OF WATER:  buPROPion (WELLBUTRIN XL)  famotidine (PEPCID)  Oxycodone HCl  rosuvastatin (CRESTOR)  tizanidine (ZANAFLEX   If needed:  cetirizine (ZYRTEC) fluticasone (FLONASE)  metoCLOPramide (REGLAN)  ondansetron (ZOFRAN-ODT)   As of today, STOP taking any Aspirin (unless otherwise instructed by your surgeon) Aleve, Naproxen, Ibuprofen, Motrin, Advil, Goody's, BC's, all herbal medications, fish oil, and all vitamins.         WHAT DO I DO ABOUT MY DIABETES MEDICATION?   Do not  take oral diabetes medicines (pills) the morning of surgery.   Stop taking tirzepatide [facility]) 1 week prior to Surgery. Last dose on 12/01/2022.  THE NIGHT BEFORE SURGERY, take 7 units of insulin glargine (LANTUS SOLOSTAR).      The day of surgery, do not take other diabetes injectables, including Byetta (exenatide), Bydureon (exenatide ER), Victoza (liraglutide), or Trulicity (dulaglutide).  HOW TO MANAGE YOUR DIABETES BEFORE AND AFTER SURGERY  Why is it important to control my blood sugar before and after surgery? Improving blood sugar levels before and after surgery helps healing and can limit problems. A way of improving blood sugar control is eating a healthy diet by:  Eating less sugar and carbohydrates  Increasing activity/exercise  Talking with your doctor about reaching your blood sugar goals High blood sugars (greater than 180 mg/dL) can raise your risk of infections and slow your recovery, so you will need to focus on controlling your diabetes during the weeks before surgery. Make sure that the doctor who takes care of your diabetes knows about your planned surgery including the date and location.  How do I manage my blood sugar before surgery? Check your blood sugar at least 4 times a day, starting 2 days before surgery, to make sure that the level is not too high or low.  Check your blood sugar the morning of your surgery when you wake up and every 2 hours until you get to the Short Stay unit.  If your  blood sugar is less than 70 mg/dL, you will need to treat for low blood sugar: Do not take insulin. Treat a low blood sugar (less than 70 mg/dL) with  cup of clear juice (cranberry or apple), 4 glucose tablets, OR glucose gel. Recheck blood sugar in 15 minutes after treatment (to make sure it is greater than 70 mg/dL). If your blood sugar is not greater than 70 mg/dL on recheck, call [phone] for further instructions. Report your blood sugar to the short stay nurse  when you get to Short Stay.  If you are admitted to the hospital after surgery: Your blood sugar will be checked by the staff and you will probably be given insulin after surgery (instead of oral diabetes medicines) to make sure you have good blood sugar levels. The goal for blood sugar control after surgery is 80-180 mg/dL.   Special instructions:    Oral Hygiene is also important to reduce your risk of infection.  Remember - BRUSH YOUR TEETH THE MORNING OF SURGERY WITH YOUR REGULAR TOOTHPASTE  [place]- Preparing For Surgery  Before surgery, you can play an important role. Because skin is not sterile, your skin needs to be as free of germs as possible. You can reduce the number of germs on your skin by washing with CHG (chlorahexidine gluconate) Soap before surgery.  CHG is an antiseptic cleaner which kills germs and bonds with the skin to continue killing germs even after washing.    Please do not use if you have an allergy to CHG or antibacterial soaps. If your skin becomes reddened/irritated stop using the CHG.  Do not shave (including legs and underarms) for at least 48 hours prior to first CHG shower. It is OK to shave your face.  Please follow these instructions carefully.   Shower the NIGHT BEFORE SURGERY and the MORNING OF SURGERY  If you chose to wash your hair, wash your hair first as usual with your normal shampoo.  After you shampoo, rinse your hair and body thoroughly to remove the shampoo.  Use CHG Soap as you would any other liquid soap. You can apply CHG directly to the skin and wash gently with a scrungie or a clean washcloth.   Apply the CHG Soap to your body ONLY FROM THE NECK DOWN.  Do not use on open wounds or open sores. Avoid contact with your eyes, ears, mouth and genitals (private parts). Wash Face and genitals (private parts)  with your normal soap.   Wash thoroughly, paying special attention to the area where your surgery will be performed.  Thoroughly  rinse your body with warm water from the neck down.  DO NOT shower/wash with your normal soap after using and rinsing off the CHG Soap.  Pat yourself dry with a CLEAN TOWEL.  Wear CLEAN PAJAMAS to bed the night before surgery  Place CLEAN SHEETS on your bed the night before your surgery  DO NOT SLEEP WITH PETS.   Day of Surgery: Take a shower with CHG soap. Do not wear jewelry or makeup Do not wear lotions, powders, perfumes/colognes, or deodorant. Do not shave 48 hours prior to surgery.  Men may shave face and neck. Do not bring valuables to the hospital.  [facility] is not responsible for any belongings or valuables. Do not wear nail polish, gel polish, artificial nails, or any other type of covering on natural nails (fingers and toes) If you have artificial nails or gel coating that need to be removed by  a nail salon, please have this removed prior to surgery. Artificial nails or gel coating may interfere with anesthesia's ability to adequately monitor your vital signs. Wear Clean/Comfortable clothing the morning of surgery Remember to brush your teeth WITH YOUR REGULAR TOOTHPASTE.   Do NOT Smoke (Tobacco/Vaping) for 24 hours prior to your procedure.  If you use a CPAP at night, you may bring your mask/headgear for your overnight stay.   Contacts, glasses, piercing's, hearing aid's, dentures or partials may not be worn into surgery, please bring cases for these belongings.    For patients admitted to the hospital, discharge time will be determined by your treatment team.   Patients discharged the day of surgery will not be allowed to drive home, and someone needs to stay with them for 24 hours.  SURGICAL WAITING ROOM VISITATION Patients having surgery or a procedure may have no more than 2 support people in the waiting area - these visitors may rotate.   Children under the age of 32 must have an adult with them who is not the patient. If the patient needs to stay at the  hospital during part of their recovery, the visitor guidelines for inpatient rooms apply. Pre-op nurse will coordinate an appropriate time for 1 support person to accompany patient in pre-op.  This support person may not rotate.   Please refer to the [facility] website for the visitor guidelines for Inpatients (after your surgery is over and you are in a regular room).   Please read over the following fact sheets that you were given.    If you received a COVID test during your pre-op visit  it is requested that you wear a mask when out in public, stay away from anyone that may not be feeling well and notify your surgeon if you develop symptoms. If you have been in contact with anyone that has tested positive in the last 10 days please notify you surgeon.

## 2022-12-08 NOTE — Anesthesia Preprocedure Evaluation (Signed)
Anesthesia Evaluation  Patient identified by MRN, date of birth, ID band Patient awake    Reviewed: Allergy & Precautions, NPO status , Patient's Chart, lab work & pertinent test results  History of Anesthesia Complications Negative for: history of anesthetic complications  Airway Mallampati: III  TM Distance: >3 FB Neck ROM: Full  Mouth opening: Limited Mouth Opening  Dental  (+) Missing Multiple missing teeth. No loose teeth or dentures.:   Pulmonary former smoker   breath sounds clear to auscultation       Cardiovascular hypertension, Pt. on medications  Rhythm:Regular Rate:Normal     Neuro/Psych  Headaches  Anxiety Depression       GI/Hepatic Neg liver ROS,GERD  ,,  Endo/Other  diabetes, Insulin Dependent    Renal/GU negative Renal ROS  negative genitourinary   Musculoskeletal negative musculoskeletal ROS (+)    Abdominal  (+) + obese  Peds  Hematology negative hematology ROS (+)   Anesthesia Other Findings   Reproductive/Obstetrics                             Anesthesia Physical Anesthesia Plan  ASA: III  Anesthesia Plan: General   Post-op Pain Management: Tylenol PO (pre-op)*   Induction: Intravenous, Rapid sequence and Cricoid pressure planned  PONV Risk Score and Plan: 3 and Ondansetron, Dexamethasone, Midazolam and Treatment may vary due to age or medical condition  Airway Management Planned: Oral ETT  Additional Equipment:   Intra-op Plan:   Post-operative Plan: Extubation in OR  Informed Consent: I have reviewed the patients History and Physical, chart, labs and discussed the procedure including the risks, benefits and alternatives for the proposed anesthesia with the patient or authorized representative who has indicated his/her understanding and acceptance.       Plan Discussed with:   Anesthesia Plan Comments: (PAT note written 12/08/2022 by Shonna Chock,  PA-C.  )        Anesthesia Quick Evaluation

## 2022-12-08 NOTE — Progress Notes (Signed)
Anesthesia Chart Review:   Case: [id number] Date/Time: 12/11/22 1230   Procedure: LAPAROSCOPIC CHOLECYSTECTOMY WITH ICG DYE - GEN AND TAP BLOCK   Anesthesia type: General   Pre-op diagnosis: GALLSTONES   [facility] 02 / [facility]   Surgeons: [name], MD       DISCUSSION: Patient is a 57 year old female scheduled for the above procedure.   History includes former smoker, HTN, DM2 (with gastroparesis), GERD, claustrophobia, hysterectomy, spinal surgery (C6-7 ACDF 12/27/16).  [facility] and/or ED visits over the past few months.  - 10/14/22 - 10/15/22 admission for abdominal pain with N/V/D. She had been on GLP-1 for about 3 months due to unavailability, but had just resume that week. A1c was up to 11.8% (from 7.2% when on GLP1). She was treated with IVF and insulin for mild DKA. - 10/21/22 -/10/26/22 for abdominal pain, gastroparesis exacerbation suspected. Treated with Reglan. Prolonged QT noted on EKG. WBC elevated ~ 20K of unclear eitology. Abd xray negative. Respiratory panel negative.   Lantus 15 units daily was added for DM control. Received salt tablets for hyponatremia.  - ED visits 11/07/22, 11/11/22, 11/19/22 for abdominal pain and N/V. She had been treated for gastroparesis, but [place] ordered at 11/19/22 visit and showed sludge and tiny stones in the gallbladder. Biliary dyskinesis felt to be contributing to symptoms and referred to general surgery. Seen by Dr. Dwain Sarna on 11/30/22.   Patient is a known diabetic. CBG at PAT was 397 with serum glucose 390 at 12/08/22 PAT. This APP was not notified and chart not forwarded for review until this afternoon. I was able to speak with the patient just before 5:00 PM on 12/08/22. She reported feeling in her usual state of health. She has not rechecked her CBG yet but will do so and was advised to check CBG QID between now and surgery.  She says that she normally checks for CBGs BID with fasting CBGs ~ 118 - 132. Her Greggory Keen is  on hold for surgery (last dose 12/01/22), but she says she is compliant with Lantus "16" units Q HS. She believes her PAT glucose was so high because she had just eaten a bagel and candy before her appointment. She is working on finding an endocrinologist, but DM is currently followed by Courtney Paris, NP at [facility]. She has follow-up scheduled for 12/15/22. Lantus was just started during her  July admission due to elevated A1c 11.8%, and has not had it rechecked yet since results is still within 60 days.   I advised that a significantly elevated glucose could results in case delay or cancellation, so to continue compliance with Lantus and be consistent with carbohydrate modified diet and home CBG monitoring. She was advised to contact covering primary care if weekend glucose reading are consistently elevated, particularly if > 250 as she might need further adjustment in her Lantus. She is scheduled to arrive for surgery at 10:45 PM. I advised her to call [facility] if her glucose is > 250 on the morning of surgery.   She will get a CBG on arrival. Anesthesia team to further evaluate on the day of surgery.    VS: BP (!) 155/74   Pulse (!) 58   Temp 36.7 C (Oral)   Resp 17   Ht 5\' 6"  (1.676 m)   Wt 94.8 kg   SpO2 100%   BMI 33.75 kg/m   PROVIDERS: Courtney Paris, NP is PCP [facility])  LABS: Preoperative labs noted. See DISCUSSION.  (all labs ordered are listed, but only abnormal results are displayed)  Labs Reviewed  GLUCOSE, CAPILLARY - Abnormal; Notable for the following components:      Result Value   Glucose-Capillary 397 (*)    All other components within normal limits  BASIC METABOLIC PANEL - Abnormal; Notable for the following components:   Sodium 134 (*)    Potassium 3.2 (*)    Glucose, Bld 390 (*)    BUN <5 (*)    Calcium 7.8 (*)    All other components within normal limits  CBC - Abnormal; Notable for the following components:   Hemoglobin 11.8 (*)     HCT 35.8 (*)    All other components within normal limits     IMAGES: [place] Abd 11/19/22: IMPRESSION: 1. Sludge and tiny stones in the gallbladder. No sonographic Murphy sign noted by sonographer. 2. Hepatic steatosis.  CT Abd/pelvis 11/11/22: IMPRESSION: 1. No acute intra-abdominal or pelvic pathology. No bowel obstruction. Normal appendix. 2. Mild distal colonic diverticulosis. 3.  Aortic Atherosclerosis (ICD10-I70.0).  DG Abd/1V CXR 10/21/22: IMPRESSION: No evidence of acute cardiopulmonary or intra-abdominal process. Fluid-filled stomach.    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   per pt tested a month ago/ no results yet   Hypertension    Migraine    Neck pain    Seasonal allergies    Sinusitis     Past Surgical History:  Procedure Laterality Date   ABDOMINAL HYSTERECTOMY     per pt , in her 45's   ANTERIOR CERVICAL DECOMP/DISCECTOMY FUSION N/A 12/27/2016   Procedure: CERVICAL SIX-SEVEN ANTERIOR CERVICAL DECOMPRESSION/DISCECTOMY FUSION;  Surgeon: Tia Alert, MD;  Location: [facility] OR;  Service: Neurosurgery;  Laterality: N/A;   ANTERIOR CRUCIATE LIGAMENT REPAIR     right knee   BREAST EXCISIONAL BIOPSY Left over 20 years ago   benign   BREAST SURGERY     2 lumps removed at age 77, left breast/ benign   DENTAL SURGERY  2021   RADIOLOGY WITH ANESTHESIA Left 12/11/2017   Procedure: MRI cervical spine and left shoulder WITH ANESTHESIA;  Surgeon: Radiologist, Medication, MD;  Location: [facility] OR;  Service: Radiology;  Laterality: Left;   SHOULDER ARTHROSCOPY     right  shoulder    MEDICATIONS:  buPROPion (WELLBUTRIN XL) 300 MG 24 hr tablet   cetirizine (ZYRTEC) 10 MG tablet   famotidine (PEPCID) 40 MG tablet   fluticasone (FLONASE) 50 MCG/ACT nasal spray   hydrochlorothiazide (HYDRODIURIL) 25 MG tablet   insulin glargine (LANTUS SOLOSTAR) 100 UNIT/ML Solostar Pen   Insulin Pen Needle (PEN NEEDLES 3/16") 31G X 5 MM MISC   Lancets (ONETOUCH ULTRASOFT) lancets   metoCLOPramide (REGLAN) 10 MG tablet   ondansetron (ZOFRAN ODT) 4 MG disintegrating tablet   ondansetron (ZOFRAN-ODT) 4 MG disintegrating tablet   Oxycodone HCl 10 MG TABS   potassium chloride (MICRO-K) 10 MEQ CR capsule   promethazine (PHENERGAN) 25 MG suppository   PROMETHEGAN 50 MG suppository   rosuvastatin (CRESTOR) 10 MG tablet  sodium bicarbonate 650 MG tablet   spironolactone (ALDACTONE) 50 MG tablet   tirzepatide (MOUNJARO) 15 MG/0.5ML Pen   tizanidine (ZANAFLEX) 6 MG capsule   traZODone (DESYREL) 100 MG tablet   Vitamin D, Ergocalciferol, (DRISDOL) 1.25 MG (50000 UNIT) CAPS capsule   zolpidem (AMBIEN) 10 MG tablet   No current facility-administered medications for this encounter.    Shonna Chock, PA-C Surgical Short Stay/Anesthesiology [facility] Phone [phone] [facility] Phone [phone] 12/08/2022 5:40 PM

## 2022-12-08 NOTE — Progress Notes (Signed)
PCP - Courtney Paris, NP Cardiologist - Denies  PPM/ICD - Denies  Chest x-ray - Denies EKG - 11/20/2022 Stress Test - Denies ECHO - Denies Cardiac Cath - Denies  Sleep Study - Denies  DM: Type II Fasting Blood Sugar : 118-132 Checks Blood Sugar twice a day.  Last dose of GLP1 agonist- 12/01/2022 GLP1 instructions: Stop taking tirzepatide [facility]) 1 week prior to surgery. Last dose on 12/01/2022.  Blood Thinner Instructions: N/A Aspirin Instructions: N/A  ERAS Protcol - Yes PRE-SURGERY Ensure or G2- G2  COVID TEST- No   Anesthesia review: Yes, A1c on 10/14/2022: 11.8. CBG today was 397.  Patient denies shortness of breath, fever, cough and chest pain at PAT appointment.   All instructions explained to the patient, with a verbal understanding of the material. Patient agrees to go over the instructions while at home for a better understanding.The opportunity to ask questions was provided.

## 2022-12-11 ENCOUNTER — Encounter (HOSPITAL_COMMUNITY)
Admission: RE | Disposition: A | Discharge status: Home / Self Care | Payer: Self-pay | Source: Home / Self Care | Attending: General Surgery

## 2022-12-11 ENCOUNTER — Other Ambulatory Visit: Payer: Self-pay

## 2022-12-11 ENCOUNTER — Ambulatory Visit (HOSPITAL_BASED_OUTPATIENT_CLINIC_OR_DEPARTMENT_OTHER): Payer: MEDICAID | Admitting: Anesthesiology

## 2022-12-11 ENCOUNTER — Ambulatory Visit (HOSPITAL_COMMUNITY): Payer: MEDICAID | Admitting: Vascular Surgery

## 2022-12-11 ENCOUNTER — Encounter (HOSPITAL_COMMUNITY): Payer: Self-pay | Admitting: General Surgery

## 2022-12-11 ENCOUNTER — Ambulatory Visit (HOSPITAL_COMMUNITY)
Admission: RE | Admit: 2022-12-11 | Discharge: 2022-12-11 | Disposition: A | Discharge status: Home / Self Care | Payer: MEDICAID | Attending: General Surgery | Admitting: General Surgery

## 2022-12-11 DIAGNOSIS — E1143 Type 2 diabetes mellitus with diabetic autonomic (poly)neuropathy: Secondary | ICD-10-CM | POA: Insufficient documentation

## 2022-12-11 DIAGNOSIS — Z794 Long term (current) use of insulin: Secondary | ICD-10-CM | POA: Insufficient documentation

## 2022-12-11 DIAGNOSIS — K3184 Gastroparesis: Secondary | ICD-10-CM | POA: Diagnosis not present

## 2022-12-11 DIAGNOSIS — K8050 Calculus of bile duct without cholangitis or cholecystitis without obstruction: Secondary | ICD-10-CM | POA: Diagnosis not present

## 2022-12-11 DIAGNOSIS — Z87891 Personal history of nicotine dependence: Secondary | ICD-10-CM | POA: Diagnosis not present

## 2022-12-11 DIAGNOSIS — E119 Type 2 diabetes mellitus without complications: Secondary | ICD-10-CM

## 2022-12-11 DIAGNOSIS — I10 Essential (primary) hypertension: Secondary | ICD-10-CM | POA: Insufficient documentation

## 2022-12-11 DIAGNOSIS — Z7985 Long-term (current) use of injectable non-insulin antidiabetic drugs: Secondary | ICD-10-CM | POA: Diagnosis not present

## 2022-12-11 DIAGNOSIS — Z79899 Other long term (current) drug therapy: Secondary | ICD-10-CM | POA: Insufficient documentation

## 2022-12-11 DIAGNOSIS — K8010 Calculus of gallbladder with chronic cholecystitis without obstruction: Secondary | ICD-10-CM | POA: Insufficient documentation

## 2022-12-11 HISTORY — PX: CHOLECYSTECTOMY: SHX55

## 2022-12-11 LAB — GLUCOSE, CAPILLARY: Glucose-Capillary: 241 mg/dL — ABNORMAL HIGH (ref 70–99)

## 2022-12-11 SURGERY — LAPAROSCOPIC CHOLECYSTECTOMY WITH INTRAOPERATIVE CHOLANGIOGRAM
Anesthesia: General | Site: Abdomen

## 2022-12-11 MED ORDER — CHLORHEXIDINE GLUCONATE 0.12 % MT SOLN
15.0000 mL | Freq: Once | OROMUCOSAL | Status: AC
  Administered 2022-12-11: 15 mL via OROMUCOSAL
  Filled 2022-12-11: qty 15

## 2022-12-11 MED ORDER — CHLORHEXIDINE GLUCONATE CLOTH 2 % EX PADS: 6.0000 | MEDICATED_PAD | Freq: Once | CUTANEOUS | Status: DC

## 2022-12-11 MED ORDER — BUPIVACAINE-EPINEPHRINE (PF) 0.25% -1:200000 IJ SOLN
INTRAMUSCULAR | Status: AC
  Filled 2022-12-11: qty 30

## 2022-12-11 MED ORDER — BUPIVACAINE-EPINEPHRINE 0.25% -1:200000 IJ SOLN
INTRAMUSCULAR | Status: DC | PRN
  Administered 2022-12-11: 11 mL

## 2022-12-11 MED ORDER — INSULIN ASPART 100 UNIT/ML IJ SOLN
0.0000 [IU] | INTRAMUSCULAR | Status: DC | PRN
  Administered 2022-12-11: 6 [IU] via SUBCUTANEOUS

## 2022-12-11 MED ORDER — MIDAZOLAM HCL 2 MG/2ML IJ SOLN
INTRAMUSCULAR | Status: AC
  Filled 2022-12-11: qty 2

## 2022-12-11 MED ORDER — FENTANYL CITRATE (PF) 250 MCG/5ML IJ SOLN
INTRAMUSCULAR | Status: DC | PRN
  Administered 2022-12-11: 100 ug via INTRAVENOUS
  Administered 2022-12-11 (×2): 50 ug via INTRAVENOUS

## 2022-12-11 MED ORDER — HYDROMORPHONE HCL 1 MG/ML IJ SOLN
INTRAMUSCULAR | Status: AC
  Filled 2022-12-11: qty 1

## 2022-12-11 MED ORDER — LACTATED RINGERS IV SOLN: INTRAVENOUS | Status: DC

## 2022-12-11 MED ORDER — FENTANYL CITRATE (PF) 100 MCG/2ML IJ SOLN
INTRAMUSCULAR | Status: AC
  Filled 2022-12-11: qty 2

## 2022-12-11 MED ORDER — OXYCODONE HCL 5 MG PO TABS
5.0000 mg | ORAL_TABLET | Freq: Once | ORAL | Status: AC | PRN
  Administered 2022-12-11: 5 mg via ORAL

## 2022-12-11 MED ORDER — OXYCODONE HCL 5 MG PO TABS
ORAL_TABLET | ORAL | Status: AC
  Filled 2022-12-11: qty 1

## 2022-12-11 MED ORDER — ORAL CARE MOUTH RINSE: 15.0000 mL | Freq: Once | OROMUCOSAL | Status: AC

## 2022-12-11 MED ORDER — 0.9 % SODIUM CHLORIDE (POUR BTL) OPTIME
TOPICAL | Status: DC | PRN
  Administered 2022-12-11: 1000 mL

## 2022-12-11 MED ORDER — FENTANYL CITRATE (PF) 250 MCG/5ML IJ SOLN
INTRAMUSCULAR | Status: AC
  Filled 2022-12-11: qty 5

## 2022-12-11 MED ORDER — PROPOFOL 10 MG/ML IV BOLUS
INTRAVENOUS | Status: AC
  Filled 2022-12-11: qty 20

## 2022-12-11 MED ORDER — SUCCINYLCHOLINE CHLORIDE 200 MG/10ML IV SOSY
PREFILLED_SYRINGE | INTRAVENOUS | Status: AC
  Filled 2022-12-11: qty 10

## 2022-12-11 MED ORDER — SUGAMMADEX SODIUM 200 MG/2ML IV SOLN
INTRAVENOUS | Status: DC | PRN
  Administered 2022-12-11: 189.6 mg via INTRAVENOUS

## 2022-12-11 MED ORDER — ONDANSETRON HCL 4 MG/2ML IJ SOLN
INTRAMUSCULAR | Status: DC | PRN
  Administered 2022-12-11: 4 mg via INTRAVENOUS

## 2022-12-11 MED ORDER — HYDROMORPHONE HCL 1 MG/ML IJ SOLN
0.2500 mg | INTRAMUSCULAR | Status: DC | PRN
  Administered 2022-12-11: 0.5 mg via INTRAVENOUS

## 2022-12-11 MED ORDER — CEFAZOLIN SODIUM-DEXTROSE 2-4 GM/100ML-% IV SOLN
2.0000 g | INTRAVENOUS | Status: AC
  Administered 2022-12-11: 2 g via INTRAVENOUS
  Filled 2022-12-11: qty 100

## 2022-12-11 MED ORDER — AMISULPRIDE (ANTIEMETIC) 5 MG/2ML IV SOLN: 10.0000 mg | Freq: Once | INTRAVENOUS | Status: DC | PRN

## 2022-12-11 MED ORDER — ONDANSETRON HCL 4 MG/2ML IJ SOLN
INTRAMUSCULAR | Status: AC
  Filled 2022-12-11: qty 2

## 2022-12-11 MED ORDER — ROCURONIUM BROMIDE 10 MG/ML (PF) SYRINGE
PREFILLED_SYRINGE | INTRAVENOUS | Status: DC | PRN
  Administered 2022-12-11: 50 mg via INTRAVENOUS

## 2022-12-11 MED ORDER — OXYCODONE HCL 5 MG/5ML PO SOLN: 5.0000 mg | Freq: Once | ORAL | Status: AC | PRN

## 2022-12-11 MED ORDER — ROCURONIUM BROMIDE 10 MG/ML (PF) SYRINGE
PREFILLED_SYRINGE | INTRAVENOUS | Status: AC
  Filled 2022-12-11: qty 10

## 2022-12-11 MED ORDER — SPY AGENT GREEN - (INDOCYANINE FOR INJECTION)
1.2500 mg | Freq: Once | INTRAMUSCULAR | Status: AC
  Administered 2022-12-11: 1.25 mg via INTRAVENOUS

## 2022-12-11 MED ORDER — ACETAMINOPHEN 500 MG PO TABS
1000.0000 mg | ORAL_TABLET | ORAL | Status: AC
  Administered 2022-12-11: 1000 mg via ORAL
  Filled 2022-12-11: qty 2

## 2022-12-11 MED ORDER — ENSURE PRE-SURGERY PO LIQD: 296.0000 mL | Freq: Once | ORAL | Status: DC

## 2022-12-11 MED ORDER — MIDAZOLAM HCL 2 MG/2ML IJ SOLN
INTRAMUSCULAR | Status: DC | PRN
  Administered 2022-12-11: 2 mg via INTRAVENOUS

## 2022-12-11 MED ORDER — MEPERIDINE HCL 25 MG/ML IJ SOLN: 6.2500 mg | INTRAMUSCULAR | Status: DC | PRN

## 2022-12-11 MED ORDER — LIDOCAINE 2% (20 MG/ML) 5 ML SYRINGE
INTRAMUSCULAR | Status: DC | PRN
  Administered 2022-12-11: 100 mg via INTRAVENOUS

## 2022-12-11 MED ORDER — SUCCINYLCHOLINE CHLORIDE 200 MG/10ML IV SOSY
PREFILLED_SYRINGE | INTRAVENOUS | Status: DC | PRN
  Administered 2022-12-11: 120 mg via INTRAVENOUS

## 2022-12-11 MED ORDER — SODIUM CHLORIDE 0.9 % IR SOLN
Status: DC | PRN
  Administered 2022-12-11: 1000 mL

## 2022-12-11 MED ORDER — PROPOFOL 10 MG/ML IV BOLUS
INTRAVENOUS | Status: DC | PRN
  Administered 2022-12-11: 170 mg via INTRAVENOUS

## 2022-12-11 MED ORDER — INSULIN ASPART 100 UNIT/ML IJ SOLN
INTRAMUSCULAR | Status: AC
  Filled 2022-12-11: qty 1

## 2022-12-11 MED ORDER — OXYCODONE HCL 5 MG PO TABS
5.0000 mg | ORAL_TABLET | Freq: Four times a day (QID) | ORAL | 0 refills | Status: DC | PRN
Start: 2022-12-11 — End: 2023-01-29

## 2022-12-11 MED ORDER — LIDOCAINE 2% (20 MG/ML) 5 ML SYRINGE
INTRAMUSCULAR | Status: AC
  Filled 2022-12-11: qty 5

## 2022-12-11 SURGICAL SUPPLY — 45 items
ADH SKN CLS APL DERMABOND .7 (GAUZE/BANDAGES/DRESSINGS) ×1
APL PRP STRL LF DISP 70% ISPRP (MISCELLANEOUS) ×1
APPLIER CLIP 5 13 M/L LIGAMAX5 (MISCELLANEOUS) ×1
APR CLP MED LRG 5 ANG JAW (MISCELLANEOUS) ×1
BAG COUNTER SPONGE SURGICOUNT (BAG) ×1 IMPLANT
BAG SPEC RTRVL 10 TROC 200 (ENDOMECHANICALS) ×1
BAG SPNG CNTER NS LX DISP (BAG)
BLADE CLIPPER SURG (BLADE) IMPLANT
CANISTER SUCT 3000ML PPV (MISCELLANEOUS) ×1 IMPLANT
CHLORAPREP W/TINT 26 (MISCELLANEOUS) ×1 IMPLANT
CLIP APPLIE 5 13 M/L LIGAMAX5 (MISCELLANEOUS) ×1 IMPLANT
COVER MAYO STAND STRL (DRAPES) IMPLANT
COVER SURGICAL LIGHT HANDLE (MISCELLANEOUS) ×1 IMPLANT
DERMABOND ADVANCED .7 DNX12 (GAUZE/BANDAGES/DRESSINGS) ×1 IMPLANT
DRAPE C-ARM 42X120 X-RAY (DRAPES) IMPLANT
ELECT REM PT RETURN 9FT ADLT (ELECTROSURGICAL) ×1
ELECTRODE REM PT RTRN 9FT ADLT (ELECTROSURGICAL) ×1 IMPLANT
GLOVE BIO SURGEON STRL SZ7 (GLOVE) ×1 IMPLANT
GLOVE BIOGEL PI IND STRL 7.5 (GLOVE) ×1 IMPLANT
GOWN STRL REUS W/ TWL LRG LVL3 (GOWN DISPOSABLE) ×3 IMPLANT
GOWN STRL REUS W/TWL LRG LVL3 (GOWN DISPOSABLE) ×3
GRASPER SUT TROCAR 14GX15 (MISCELLANEOUS) ×1 IMPLANT
IRRIG SUCT STRYKERFLOW 2 WTIP (MISCELLANEOUS) ×1
IRRIGATION SUCT STRKRFLW 2 WTP (MISCELLANEOUS) ×1 IMPLANT
KIT BASIN OR (CUSTOM PROCEDURE TRAY) ×1 IMPLANT
KIT IMAGING PINPOINTPAQ (MISCELLANEOUS) IMPLANT
KIT TURNOVER KIT B (KITS) ×1 IMPLANT
NS IRRIG 1000ML POUR BTL (IV SOLUTION) ×1 IMPLANT
PAD ARMBOARD 7.5X6 YLW CONV (MISCELLANEOUS) ×1 IMPLANT
POUCH RETRIEVAL ECOSAC 10 (ENDOMECHANICALS) ×1 IMPLANT
SCISSORS LAP 5X35 DISP (ENDOMECHANICALS) ×1 IMPLANT
SET TUBE SMOKE EVAC HIGH FLOW (TUBING) ×1 IMPLANT
SLEEVE Z-THREAD 5X100MM (TROCAR) ×2 IMPLANT
SPECIMEN JAR SMALL (MISCELLANEOUS) ×1 IMPLANT
STRIP CLOSURE SKIN 1/2X4 (GAUZE/BANDAGES/DRESSINGS) ×1 IMPLANT
SUT MNCRL AB 4-0 PS2 18 (SUTURE) ×1 IMPLANT
SUT VICRYL 0 UR6 27IN ABS (SUTURE) ×1 IMPLANT
TOWEL GREEN STERILE (TOWEL DISPOSABLE) ×1 IMPLANT
TOWEL GREEN STERILE FF (TOWEL DISPOSABLE) ×1 IMPLANT
TRAY LAPAROSCOPIC MC (CUSTOM PROCEDURE TRAY) ×1 IMPLANT
TROCAR XCEL NON-BLD 5MMX100MML (ENDOMECHANICALS) IMPLANT
TROCAR Z THREAD OPTICAL 12X100 (TROCAR) IMPLANT
TROCAR Z-THREAD OPTICAL 5X100M (TROCAR) ×1 IMPLANT
WARMER LAPAROSCOPE (MISCELLANEOUS) ×1 IMPLANT
WATER STERILE IRR 1000ML POUR (IV SOLUTION) ×1 IMPLANT

## 2022-12-11 NOTE — Op Note (Signed)
Preoperative diagnosis: biliary colic Postoperative diagnosis: same as above Procedure: Laparoscopic cholecystectomy Surgeon: Dr. Harden Mo Estimated blood loss: Minimal Specimens: Gallbladder and contents to pathology Complications: None Drains: None Sponge needle count was correct completion Disposition recovery stable condition   Indications: 57 year old female with a history of diabetic gastroparesis. She has been on Reglan for a year without any real help. She has had nausea vomiting nearly daily for about a couple of months. She does have some loose stools. She has right upper quadrant pain during some of these episodes especially while she is throwing up. This occurs usually a couple hours after eating. She has lost a fair amount of weight. She has been to the ER numerous times and was admitted previously. She has a previous HIDA scan done in 2022 that is really normal. She has a CT scan in July with an emergency room visit that was normal. She has a CT scan from a couple of weeks ago and an emergency room visit that is also negative. She returned back to the emergency room where she underwent an ultrasound. This showed sludge and tiny stones in the gallbladder but was otherwise normal. We discussed all of her options and elected to proceed with lap chole to see if her gallbladder was contributing to her symptoms.  She understands this might not help.   Procedure: After informed consent was obtained she was taken to the operating room.  She had received antibiotics.She had SCDs in place. She was given ICG dye.  She was placed under general anesthesia without complication.  She was prepped and draped in a standard sterile surgical fashion.  A surgical timeout was then performed.   I infiltrated Marcaine in the left upper quadrant. I then made a small incision and used a 5 mm direct optical entry trocar to enter the peritoneal cavity. This was done without injury.  The abdomen was then  insufflated.  I then placed a 11 mm trocar in supraumbilical positoin.   I then inserted 3 additional 5 mm trocars in the epigastrium and the right side of the abdomen under direct vision without complication.  Her gallbladder did appear to have chronic disease and did not appear normal.  The omentum and duodenum were adherent and this was taken down.  It was then retracted cephalad and lateral.  I then dissected the triangle.  I did use green dye to confirm my anatomy to ensure I was away from the common bile duct.  I then clipped the artery 3 times and divided it leaving 2 clips in place.  The cystic duct was then clipped 3 times and divided as well.   I then removed the gallbladder from the liver bed.  The artery tracked up high so I did place some clips very high on the artery in the liver bed also. This was clearly the cystic artery. Once I had done this I placed it in a retrieval bag. This was then removed from the abdomen.  Hemostasis was observed.  All the fluid was evacuated.  I removed the 11 mm trocar and close this with a 0 vicryl and the suture passer.   I then desufflated the abdomen and removed the remaining trocars.  These were closed with 4-0 Monocryl and glue.  She tolerated this well was extubated and transferred recovery stable.

## 2022-12-11 NOTE — Discharge Instructions (Signed)

## 2022-12-11 NOTE — Transfer of Care (Signed)
Immediate Anesthesia Transfer of Care Note  Patient: Catherine Pope  Procedure(s) Performed: LAPAROSCOPIC CHOLECYSTECTOMY (Abdomen) INDOCYANINE GREEN FLUORESCENCE IMAGING (ICG) (Abdomen)  Patient Location: PACU  Anesthesia Type:General  Level of Consciousness: drowsy  Airway & Oxygen Therapy: Patient Spontanous Breathing  Post-op Assessment: Report given to RN and Post -op Vital signs reviewed and stable  Post vital signs: Reviewed and stable  Last Vitals:  Vitals Value Taken Time  BP 166/71 12/11/22 1310  Temp    Pulse 77 12/11/22 1313  Resp 15 12/11/22 1313  SpO2 93 % 12/11/22 1313  Vitals shown include unfiled device data.  Last Pain:  Vitals:   12/11/22 1034  PainSc: 0-No pain         Complications: No notable events documented.

## 2022-12-11 NOTE — Anesthesia Postprocedure Evaluation (Signed)
Anesthesia Post Note  Patient: Catherine Pope  Procedure(s) Performed: LAPAROSCOPIC CHOLECYSTECTOMY (Abdomen) INDOCYANINE GREEN FLUORESCENCE IMAGING (ICG) (Abdomen)     Patient location during evaluation: PACU Anesthesia Type: General Level of consciousness: awake and alert Pain management: pain level controlled Vital Signs Assessment: post-procedure vital signs reviewed and stable Respiratory status: spontaneous breathing, nonlabored ventilation and respiratory function stable Cardiovascular status: blood pressure returned to baseline and stable Postop Assessment: no apparent nausea or vomiting Anesthetic complications: no   No notable events documented.  Last Vitals:  Vitals:   12/11/22 1345 12/11/22 1400  BP: 103/88 133/83  Pulse: 71 70  Resp: 13 15  Temp:  36.6 C  SpO2: 99% 98%    Last Pain:  Vitals:   12/11/22 1400  PainSc: 4                  Lowella Curb

## 2022-12-11 NOTE — H&P (Signed)
57 year old female with a history of diabetic gastroparesis. She has been seen by an outside gastroenterologist in the past. She has been on Reglan for a year without any real help. She has had nausea vomiting nearly daily for about a couple of months. She does have some loose stools. She has right upper quadrant pain during some of these episodes especially while she is throwing up. This occurs usually a couple hours after eating. She has lost a fair amount of weight. She has been to the ER numerous times and was admitted previously. She has a previous HIDA scan done in 2022 that is really normal. She has a CT scan in July with an emergency room visit that was normal. She has a CT scan from a couple of weeks ago and an emergency room visit that is also negative. She returned back to the emergency room where she underwent an ultrasound. This showed sludge and tiny stones in the gallbladder but was otherwise normal. She is here today to discuss her options.  Review of Systems: A complete review of systems was obtained from the patient. I have reviewed this information and discussed as appropriate with the patient. See HPI as well for other ROS.  Review of Systems  Constitutional: Positive for chills and weight loss.  Gastrointestinal: Positive for abdominal pain, diarrhea, nausea and vomiting.  All other systems reviewed and are negative.  Medical History: Past Medical History:  Diagnosis Date  Hypertension   Past Surgical History:  Procedure Laterality Date  ARTHROSCOPIC REPAIR ACL  HYSTERECTOMY  MASTECTOMY PARTIAL / LUMPECTOMY   No Known Allergies  Current Outpatient Medications on File Prior to Visit  Medication Sig Dispense Refill  ALPRAZolam (XANAX) 0.25 MG tablet TAKE 1 TABLET BY MOUTH TWICE DAILY AS NEEDED FOR ACUTE ANXIETY  aspirin 325 MG tablet Take 325 mg by mouth once daily  BD ULTRA-FINE MINI PEN NEEDLE 31 gauge x 3/16" needle USE WITH LANTUS 15 UNITS DAILY  buPROPion  (WELLBUTRIN XL) 300 MG XL tablet Take 300 mg by mouth once daily AS DIRECTED  cetirizine (ZYRTEC) 10 MG tablet Take 10 mg by mouth at bedtime  cholecalciferol, vitamin D3, (CHOLECALCIFEROL, VIT D3,,BULK,) 100,000 unit/gram Powd Take by mouth  diethylpropion 75 mg TbER Take 1 tablet by mouth once daily  DULoxetine (CYMBALTA) 60 MG DR capsule Take 60 mg by mouth once daily  famotidine (PEPCID) 20 MG tablet  gabapentin (NEURONTIN) 800 MG tablet Take 800 mg by mouth 4 (four) times daily  galcanezumab-gnlm (EMGALITY PEN) 120 mg/mL PnIj  hydroCHLOROthiazide (HYDRODIURIL) 25 MG tablet Take 1 tablet by mouth once daily  hydrOXYzine (ATARAX) 25 MG tablet Take 25 mg by mouth 3 (three) times daily  LANTUS SOLOSTAR U-100 INSULIN pen injector (concentration 100 units/mL) Inject subcutaneously  lisinopriL (ZESTRIL) 40 MG tablet Take 40 mg by mouth once daily  metoclopramide (REGLAN) 10 MG tablet Take by mouth  MOUNJARO 15 mg/0.5 mL pen injector Inject subcutaneously  ondansetron (ZOFRAN-ODT) 4 MG disintegrating tablet DISSOLVE 1 TABLET IN MOUTH EVERY 8 HOURS AS NEEDED FOR NAUSEA AND VOMITING FOR UP TO 15 DOSES  oxyCODONE (OXYIR) 10 mg immediate release tablet TAKE 1 TABLET BY MOUTH FIVE TIMES DAILY AS NEEDED FOR PAIN  promethazine (PHENERGAN) 25 MG suppository INSERT 1 SUPPOSITORY RECTALLY EVERY 8 HOURS AS NEEDED FOR UP TO 6 DOSES FOR REFRACTORY NAUSEA/VOMITING  rosuvastatin (CRESTOR) 10 MG tablet Take 10 mg by mouth once daily  sodium bicarbonate 650 MG tablet Take by mouth  TiZANidine (ZANAFLEX) 6  MG capsule TAKE 1 CAPSULE BY MOUTH THREE TIMES DAILY AS NEEDED FOR MUSCLE SPASM. PT INSTRUCTED ON SAFE MEDICATIN ADMINISTRATIN INCLUDING SPACING OF OPIOIDS AND MUSCLE RELAXER 1-2 HOURS TO AVOID OVERSEDATION.  XTAMPZA ER 27 mg ER capsule Take 27 mg by mouth 2 (two) times daily  zolpidem (AMBIEN) 10 mg tablet TAKE 1 TABLET BY MOUTH AT BEDTIME AS NEEDED FOR SLEEP. PT INSTRUCTED ON SAFE MEDICATION ADMINISTRATION  INCLUDING SPACING OF OPIOIDS AND INSOMINIA MEDICATION TO AVOID OVERSEDATION.   No current facility-administered medications on file prior to visit.   Family History  Problem Relation Age of Onset  Obesity Mother  High blood pressure (Hypertension) Mother  Hyperlipidemia (Elevated cholesterol) Mother  Breast cancer Mother  High blood pressure (Hypertension) Father  Hyperlipidemia (Elevated cholesterol) Father  Coronary Artery Disease (Blocked arteries around heart) Father  Diabetes Father  Skin cancer Sister  Obesity Sister    Social History   Tobacco Use  Smoking Status Former  Types: Cigarettes  Start date: 04/2022  Smokeless Tobacco Never  Marital status: Single  Tobacco Use  Smoking status: Former  Types: Cigarettes  Start date: 04/2022  Smokeless tobacco: Never  Substance and Sexual Activity  Alcohol use: Never  Drug use: Never    Objective:   Vitals:  11/30/22 0941 11/30/22 0942  BP: (!) 138/90  Pulse: 82  Temp: 36.6 C (97.9 F)  SpO2: 98%  Weight: 88.9 kg (196 lb)  Height: 167.6 cm (5\' 6" )  PainSc: 3  PainLoc: Abdomen  Body mass index is 31.64 kg/m.  Physical Exam Vitals reviewed.  Constitutional:  Appearance: Normal appearance.  Eyes:  General: No scleral icterus. Abdominal:  General: There is no distension.  Palpations: Abdomen is soft.  Tenderness: There is abdominal tenderness (mild with deep palpation) in the right upper quadrant.  Neurological:  Mental Status: She is alert.   Assessment and Plan:   Gallstones  Cholecystectomy  I explained to her that I am not entirely sure that her symptoms are related to her gallbladder. They could certainly be related or if she underwent cholecystectomy she understands this may not help any of her symptoms. It is interesting that Reglan has not made her diarrhea but it gastroparesis any better at all. We discussed all of the options including following up with her gastroenterologist, proceeding with  a HIDA scan, or proceeding with surgery.  After long discussion I really think the only way to ensure that her gallbladder has nothing to do with this is to remove it as the next test. We discussed laparoscopic cholecystectomy detail.  I discussed the procedure in detail. We discussed the risks and benefits of a laparoscopic cholecystectomy and possible cholangiogram including, but not limited to bleeding, infection, injury to surrounding structures such as the intestine or liver, bile leak, retained gallstones, need to convert to an open procedure, prolonged diarrhea, blood clots such as DVT, common bile duct injury, anesthesia risks, and possible need for additional procedures. The likelihood of improvement in symptoms and return to the patient's normal status is good. We discussed the typical post-operative recovery course.

## 2022-12-11 NOTE — Interval H&P Note (Signed)
History and Physical Interval Note:  12/11/2022 11:22 AM  Catherine Pope  has presented today for surgery, with the diagnosis of GALLSTONES.  The various methods of treatment have been discussed with the patient and family. After consideration of risks, benefits and other options for treatment, the patient has consented to  Procedure(s) with comments: LAPAROSCOPIC CHOLECYSTECTOMY WITH ICG DYE (N/A) - GEN AND TAP BLOCK as a surgical intervention.  The patient's history has been reviewed, patient examined, no change in status, stable for surgery.  I have reviewed the patient's chart and labs.  Questions were answered to the patient's satisfaction.     [name]

## 2022-12-11 NOTE — Anesthesia Procedure Notes (Addendum)
Procedure Name: Intubation Date/Time: 12/11/2022 12:09 PM  Performed by: Randon Goldsmith, CRNAPre-anesthesia Checklist: Patient identified, Emergency Drugs available, Suction available and Patient being monitored Patient Re-evaluated:Patient Re-evaluated prior to induction Oxygen Delivery Method: Circle system utilized Preoxygenation: Pre-oxygenation with 100% oxygen Induction Type: IV induction, Rapid sequence and Cricoid Pressure applied Laryngoscope Size: Mac and 3 Grade View: Grade I Tube type: Oral Tube size: 7.0 mm Number of attempts: 1 Airway Equipment and Method: Stylet and Oral airway Placement Confirmation: ETT inserted through vocal cords under direct vision, positive ETCO2 and breath sounds checked- equal and bilateral Secured at: 21 cm Tube secured with: Tape Dental Injury: Teeth and Oropharynx as per pre-operative assessment

## 2022-12-12 ENCOUNTER — Encounter (HOSPITAL_COMMUNITY): Payer: Self-pay | Admitting: General Surgery

## 2022-12-12 LAB — GLUCOSE, CAPILLARY: Glucose-Capillary: 190 mg/dL — ABNORMAL HIGH (ref 70–99)

## 2022-12-12 LAB — SURGICAL PATHOLOGY

## 2023-01-29 ENCOUNTER — Encounter: Payer: Self-pay | Admitting: Gastroenterology

## 2023-01-29 ENCOUNTER — Ambulatory Visit (INDEPENDENT_AMBULATORY_CARE_PROVIDER_SITE_OTHER): Payer: MEDICAID | Admitting: Gastroenterology

## 2023-01-29 VITALS — BP 170/90 | HR 91 | Ht 66.0 in | Wt 202.0 lb

## 2023-01-29 DIAGNOSIS — R112 Nausea with vomiting, unspecified: Secondary | ICD-10-CM | POA: Diagnosis not present

## 2023-01-29 DIAGNOSIS — E1143 Type 2 diabetes mellitus with diabetic autonomic (poly)neuropathy: Secondary | ICD-10-CM

## 2023-01-29 DIAGNOSIS — K3184 Gastroparesis: Secondary | ICD-10-CM | POA: Diagnosis not present

## 2023-01-29 NOTE — Progress Notes (Signed)
[facility] Consult Note:  History: Catherine Pope 01/29/2023  Referring provider: Courtney Paris, NP  Reason for consult/chief complaint: Abdominal Pain (Pt reports abdominal pain accompanied by nausea and vomiting that lasted over a month;  symptoms have since improved since having her gallbladder out) and heme postive (Pt states she had positive stool cards while in the hospital; reports she had a colonoscopy with Toma Copier last year)   Subjective  HPI: Catherine Pope seems to have been referred to [place] by one of the [facility] hospitalists after her recent ED visit or hospitalization.  She underwent a screening colonoscopy with me December 2019, 2 diminutive tubular adenomas were removed.  That was her last visit at this practice. Chart review indicates she has poorly controlled type 2 diabetes with last hemoglobin A1c of 11.2 in July of this year, and she was found to have diabetic gastroparesis on gastric emptying study in 2022.  She she recently underwent laparoscopic cholecystectomy by Dr. Dwain Sarna. ___________________________ Exam somewhat challenging by fragmented care and limited health literacy. Today, she states tat she was referred by the hospital for gastroparesis.  Initially she told our CMA that she had been sent here for blood in the stool that was detected during hospitalization.  I cannot seem to find any notes that effect nor any stool studies during visits to the [facility] hospitals.  Then she said the doctors taking care of her in the hospital said she should see our practice because they did not have access to records at [facility] medical clinic where it turns out she transferred her GI care at least 2 years ago.  Her primary care provider is in that same clinic system.  She is followed by GI specialist at [facility] Dr Kris Mouton, whose name is on the requisition for a 2022 gastric emptying study.  Catherine Pope confirms that Dr. Silvestre Gunner prescribes her medicine for that  condition (presumably metoclopramide, which is on her list). Her last appointment was about 6 months ago. She recalls her abdominal pain episode that caused her to visit the ED and also be hospitalized. She was also experiencing accompanying nausea and vomiting. They believed her symptoms were due to a gallbladder complications thus she underwent a cholecystectomy. She states that her symptoms have improved since her cholecystectomy. She continues to experience occasional mild nausea.   While she had not noticed this, I mentioned observing during today's visit that she has involuntary muscle movement around the mouth. We discussed how this can be a side effect of taking Reglan for her gastroparesis, which she was not aware of.     She states that she restarted Mounjaro to manage her diabetes per her doctor's recommendations Catherine Pope also says that her PCP at [facility] had talked about getting her referred to an endocrinologist that her most recent visit but she never got a call from anyone and does not know the status of that. Patient denies diarrhea, constipation, blood in stool, black stool, bloating, unintentional weight loss, reflux, dysphagia.  ROS:  Review of Systems  Constitutional:  Negative for appetite change and fever.  HENT:  Negative for trouble swallowing.   Respiratory:  Negative for cough and shortness of breath.   Cardiovascular:  Negative for chest pain.  Gastrointestinal:  Positive for abdominal pain, nausea and vomiting. Negative for abdominal distention, anal bleeding, blood in stool, constipation, diarrhea and rectal pain.  Genitourinary:  Negative for dysuria.  Musculoskeletal:  Negative for back pain.  Skin:  Negative for rash.  Neurological:  Negative for weakness.  All other systems reviewed and are negative.    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   per pt tested a month ago/ no results yet   Hypertension    Migraine    Neck pain    Seasonal allergies    Sinusitis      Past Surgical History: Past Surgical History:  Procedure Laterality Date   ABDOMINAL HYSTERECTOMY     per pt , in her 28's   ANTERIOR CERVICAL DECOMP/DISCECTOMY FUSION N/A 12/27/2016   Procedure: CERVICAL SIX-SEVEN ANTERIOR CERVICAL DECOMPRESSION/DISCECTOMY FUSION;  Surgeon: Tia Alert, MD;  Location: [facility] OR;  Service: Neurosurgery;  Laterality: N/A;   ANTERIOR CRUCIATE LIGAMENT REPAIR     right knee   BREAST EXCISIONAL BIOPSY Left over 20 years ago   benign   BREAST SURGERY     2 lumps removed at age 89, left breast/ benign   CHOLECYSTECTOMY N/A 12/11/2022   Procedure: LAPAROSCOPIC CHOLECYSTECTOMY;  Surgeon: [name], MD;  Location: [facility] OR;  Service: General;  Laterality: N/A;   DENTAL SURGERY  2021   RADIOLOGY WITH ANESTHESIA Left 12/11/2017   Procedure: MRI cervical spine and left shoulder WITH ANESTHESIA;  Surgeon: Radiologist, Medication, MD;  Location: [facility] OR;  Service: Radiology;  Laterality: Left;   SHOULDER ARTHROSCOPY     right shoulder     Family History: Family History  Problem Relation Age of Onset   Hypertension Mother    Heart Problems Father    Diabetes Father    Hypertension Other    Heart disease Other    Stroke Sister    Kidney disease Brother     Social History: Social History   Socioeconomic History   Marital status: Single    Spouse name: Not on file   Number of children: 5   Years of education: 12 th   Highest education level: Not on file  Occupational History   Occupation: unemployed  Tobacco Use   Smoking status: Former    Current packs/day: 0.50    Average packs/day: 0.5 packs/day for 15.0 years (7.5 ttl pk-yrs)    Types: Cigarettes   Smokeless tobacco: Never   Tobacco comments:     less than half pack a day.  Vaping Use   Vaping status: Never Used  Substance and Sexual Activity   Alcohol use: No   Drug use: No   Sexual activity: Not Currently  Other Topics Concern   Not on file  Social History Narrative   Lives at home with children (2 biological, 3 adopted).   Right handed.    Caffeine: three cups daily.   Social Determinants of Health   Financial Resource Strain: High Risk (03/21/2018)   Overall Financial Resource Strain (CARDIA)    Difficulty of Paying Living Expenses: Very hard  Food Insecurity: No Food Insecurity (10/22/2022)   Hunger Vital Sign    Worried About Running Out of Food in the Last Year: Never true    Ran Out of Food in the Last Year: Never true  Transportation Needs: No Transportation Needs (10/22/2022)   PRAPARE - Administrator, Civil Service (Medical): No    Lack of Transportation (Non-Medical): No  Physical  Activity: Inactive (03/21/2018)   Exercise Vital Sign    Days of Exercise per Week: 0 days    Minutes of Exercise per Session: 0 min  Stress: Stress Concern Present (03/21/2018)   [facility]atherine Pope of Occupational Health - Occupational Stress Questionnaire    Feeling of Stress : Very much  Social Connections: Moderately Isolated (03/21/2018)   Social Connection and Isolation Panel [NHANES]    Frequency of Communication with Friends and Family: More than three times a week    Frequency of Social Gatherings with Friends and Family: Never    Attends Religious Services: Never    Database administrator or Organizations: No    Attends Engineer, structural: Never    Marital Status: Never married    Allergies: No Known Allergies  Outpatient Meds: Current Outpatient Medications  Medication Sig Dispense Refill   buPROPion (WELLBUTRIN XL) 300 MG 24 hr tablet Take 300 mg by mouth daily.     cetirizine (ZYRTEC) 10 MG tablet Take 10 mg by mouth daily as needed for allergies.     famotidine (PEPCID) 40 MG tablet  Take 40 mg by mouth 2 (two) times daily.     fluticasone (FLONASE) 50 MCG/ACT nasal spray Place 2 sprays into both nostrils daily as needed for allergies or rhinitis.     insulin glargine (LANTUS SOLOSTAR) 100 UNIT/ML Solostar Pen Inject 15 Units into the skin daily. (Patient taking differently: Inject 15 Units into the skin at bedtime.) 15 mL 0   Insulin Pen Needle (PEN NEEDLES 3/16") 31G X 5 MM MISC Lantus 15units daily 100 each 0   Lancets (ONETOUCH ULTRASOFT) lancets Use as instructed 100 each 12   metoCLOPramide (REGLAN) 10 MG tablet Take 1 tablet (10 mg total) by mouth every 6 (six) hours. (Patient taking differently: Take 10 mg by mouth every 6 (six) hours as needed for vomiting or nausea.) 30 tablet 1   Oxycodone HCl 10 MG TABS Take 10 mg by mouth in the morning, at noon, and at bedtime.     rosuvastatin (CRESTOR) 10 MG tablet Take 10 mg by mouth See admin instructions. Take 10 mg by mouth for one week each month     tirzepatide (MOUNJARO) 15 MG/0.5ML Pen Inject 15 mg into the skin once a week. Injection on Fridays     tizanidine (ZANAFLEX) 6 MG capsule Take 6 mg by mouth 3 (three) times daily.     Vitamin D, Ergocalciferol, (DRISDOL) 1.25 MG (50000 UNIT) CAPS capsule Take 50,000 Units by mouth once a week.     zolpidem (AMBIEN) 10 MG tablet Take 10 mg by mouth at bedtime.     spironolactone (ALDACTONE) 50 MG tablet Take 50 mg by mouth daily. (Patient not taking: Reported on 01/29/2023)     No current facility-administered medications for this visit.      ___________________________________________________________________ Objective   Exam:  BP (!) 170/90   Pulse 91   Ht 5\' 6"  (1.676 m)   Wt 202 lb (91.6 kg)   BMI 32.60 kg/m  Wt Readings from Last 3 Encounters:  01/29/23 202 lb (91.6 kg)  12/11/22 209 lb (94.8 kg)  12/08/22 209 lb 1.6 oz (94.8 kg)    General: well-appearing   Eyes: sclera anicteric, no redness ENT: oral mucosa moist without lesions,, edentulous, no  cervical or supraclavicular lymphadenopathy.  She has a fairly constant chewing movement of the jaw and lips, that persisted even after I had her spit out some chewing gum. CV: RRR,  no JVD, no peripheral edema Resp: clear to auscultation bilaterally, normal RR and effort noted GI: soft, no tenderness, with active bowel sounds. No guarding or palpable organomegaly noted. Skin; warm and dry, no rash or jaundice noted Neuro: awake, alert and oriented x 3. Normal gross motor function and fluent speech (see above)   Labs:     Latest Ref Rng & Units 12/08/2022   11:50 AM 11/19/2022    3:54 PM 11/11/2022    6:55 PM  CBC  WBC 4.0 - 10.5 K/uL 10.2  21.0  15.7   Hemoglobin 12.0 - 15.0 g/dL 16.1  09.6  04.5   Hematocrit 36.0 - 46.0 % 35.8  45.4  42.4   Platelets 150 - 400 K/uL 258  384  281       Latest Ref Rng & Units 12/08/2022   11:50 AM 11/19/2022    3:54 PM 11/11/2022    6:55 PM  CMP  Glucose 70 - 99 mg/dL 409  811  914   BUN 6 - 20 mg/dL 5  25  10    Creatinine 0.44 - 1.00 mg/dL 7.82  9.56  2.13   Sodium 135 - 145 mmol/L 134  135  134   Potassium 3.5 - 5.1 mmol/L 3.2  3.2  3.5   Chloride 98 - 111 mmol/L 102  100  100   CO2 22 - 32 mmol/L 23  21  22    Calcium 8.9 - 10.3 mg/dL 7.8  9.1  9.2   Total Protein 6.5 - 8.1 g/dL  8.1  7.8   Total Bilirubin 0.3 - 1.2 mg/dL  0.7  1.1   Alkaline Phos 38 - 126 U/L  68  64   AST 15 - 41 U/L  24  29   ALT 0 - 44 U/L  28  29      Radiologic Studies:   Assessment: Diabetic gastroparesis (HCC)  Nausea and vomiting, unspecified vomiting type   As near as I can determine, Baker was sent to [place] by one of the [facility] hospitalists after one of the ED visits or hospitalizations there for evaluation of her gastroparesis.  Ludia has transferred her care to [facility] medical clinic since I last saw her, and receives both primary care and GI specialty care there.  Her GI physician has prescribed metoclopramide that I believe is causing her tardive dyskinesia  based on my observations today.  She also needs endocrinology assistance for very poorly controlled diabetes that is contributing to her gastroparesis and other problems as well. Mablean further reports having had a colonoscopy at [facility] medical clinic about a year ago.  None of the records from that clinic are available to [place], and she is clearly already receiving care with that practice. I strongly recommended she contact her primary care clinic today and request for an endocrinology evaluation soon.  She also needs to contact Dr. Silvestre Gunner and be seen soon for management of her gastroparesis and reconsideration of medicines for that.  I further recommended that she stop taking the metoclopramide right now because of the probable tardive dyskinesia that I have seen today.   Amada Jupiter, MD    [facility]    Ladona Mow M Kadhim,acting as a scribe for Charlie Pitter III, MD.,have documented all relevant documentation on the behalf of Sherrilyn Rist, MD,as directed by  Sherrilyn Rist, MD while in the presence of Sherrilyn Rist, MD.   Marvis Repress III,  MD, have reviewed all documentation for this visit. The documentation on 01/29/23 for the exam, diagnosis, procedures, and orders are all accurate and complete.    CC: Referring provider noted above

## 2023-01-29 NOTE — Progress Notes (Deleted)
[facility] Consult Note:  History: Catherine Pope 01/29/2023  Referring provider: Courtney Paris, NP  Reason for consult/chief complaint: No chief complaint on file.   Subjective  HPI: Catherine Pope was referred to [place] by her primary care provider for heme positive stool.  She underwent a screening colonoscopy with me December 2019, 2 diminutive tubular adenomas were removed. Chart review indicates she has poorly controlled type 2 diabetes with last hemoglobin A1c of 11.2 in July of this year, and she was found to have diabetic gastroparesis on gastric emptying study in 2022.  She she recently underwent laparoscopic cholecystectomy by Dr. Dwain Sarna. 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   per pt tested a month ago/ no results yet   Hypertension    Migraine    Neck pain    Seasonal allergies    Sinusitis      Past Surgical History: Past Surgical History:  Procedure Laterality Date   ABDOMINAL HYSTERECTOMY     per pt , in her 69's   ANTERIOR CERVICAL DECOMP/DISCECTOMY FUSION N/A 12/27/2016   Procedure: CERVICAL SIX-SEVEN ANTERIOR CERVICAL DECOMPRESSION/DISCECTOMY FUSION;  Surgeon: Tia Alert, MD;  Location: [facility] OR;  Service: Neurosurgery;  Laterality: N/A;   ANTERIOR CRUCIATE LIGAMENT REPAIR     right knee   BREAST EXCISIONAL BIOPSY Left over 20 years ago   benign   BREAST SURGERY     2 lumps removed at age 44, left breast/ benign   CHOLECYSTECTOMY N/A 12/11/2022   Procedure: LAPAROSCOPIC CHOLECYSTECTOMY;  Surgeon: [name], MD;  Location: [facility] OR;  Service: General;  Laterality: N/A;   DENTAL SURGERY  2021   RADIOLOGY WITH ANESTHESIA Left 12/11/2017   Procedure: MRI cervical spine  and left shoulder WITH ANESTHESIA;  Surgeon: Radiologist, Medication, MD;  Location: [facility] OR;  Service: Radiology;  Laterality: Left;   SHOULDER ARTHROSCOPY     right shoulder     Family History: Family History  Problem Relation Age of Onset   Hypertension Mother    Heart Problems Father    Diabetes Father    Hypertension Other    Heart disease Other    Stroke Sister    Kidney disease Brother     Social History: Social History   Socioeconomic History   Marital status: Single    Spouse name: Not on file   Number of children: 5   Years of education: 12 th   Highest education level: Not on file  Occupational History   Occupation: unemployed  Tobacco Use   Smoking status: Former    Current packs/day: 0.50    Average packs/day: 0.5 packs/day for 15.0 years (7.5 ttl pk-yrs)    Types: Cigarettes   Smokeless tobacco: Never   Tobacco comments:    less than half pack a day.  Vaping Use   Vaping status: Never Used  Substance and Sexual Activity   Alcohol use: No   Drug use: No   Sexual activity: Not Currently  Other Topics Concern   Not on file  Social History Narrative   Lives at home with children (2 biological, 3 adopted).   Right handed.    Caffeine: three  cups daily.   Social Determinants of Health   Financial Resource Strain: High Risk (03/21/2018)   Overall Financial Resource Strain (CARDIA)    Difficulty of Paying Living Expenses: Very hard  Food Insecurity: No Food Insecurity (10/22/2022)   Hunger Vital Sign    Worried About Running Out of Food in the Last Year: Never true    Ran Out of Food in the Last Year: Never true  Transportation Needs: No Transportation Needs (10/22/2022)   PRAPARE - Administrator, Civil Service (Medical): No    Lack of Transportation (Non-Medical): No  Physical Activity: Inactive (03/21/2018)   Exercise Vital Sign    Days of Exercise per Week: 0 days    Minutes of Exercise per Session: 0 min  Stress: Stress Concern  Present (03/21/2018)   [employer] of Occupational Health - Occupational Stress Questionnaire    Feeling of Stress : Very much  Social Connections: Moderately Isolated (03/21/2018)   Social Connection and Isolation Panel [NHANES]    Frequency of Communication with Friends and Family: More than three times a week    Frequency of Social Gatherings with Friends and Family: Never    Attends Religious Services: Never    Database administrator or Organizations: No    Attends Engineer, structural: Never    Marital Status: Never married    Allergies: No Known Allergies  Outpatient Meds: Current Outpatient Medications  Medication Sig Dispense Refill   buPROPion (WELLBUTRIN XL) 300 MG 24 hr tablet Take 300 mg by mouth daily.     cetirizine (ZYRTEC) 10 MG tablet Take 10 mg by mouth daily as needed for allergies.     famotidine (PEPCID) 40 MG tablet Take 40 mg by mouth 2 (two) times daily.     fluticasone (FLONASE) 50 MCG/ACT nasal spray Place 2 sprays into both nostrils daily as needed for allergies or rhinitis.     hydrochlorothiazide (HYDRODIURIL) 25 MG tablet Take 1 tablet (25 mg total) by mouth daily. 30 tablet 0   insulin glargine (LANTUS SOLOSTAR) 100 UNIT/ML Solostar Pen Inject 15 Units into the skin daily. (Patient taking differently: Inject 15 Units into the skin at bedtime.) 15 mL 0   Insulin Pen Needle (PEN NEEDLES 3/16") 31G X 5 MM MISC Lantus 15units daily 100 each 0   Lancets (ONETOUCH ULTRASOFT) lancets Use as instructed 100 each 12   metoCLOPramide (REGLAN) 10 MG tablet Take 1 tablet (10 mg total) by mouth every 6 (six) hours. (Patient taking differently: Take 10 mg by mouth every 6 (six) hours as needed for vomiting or nausea.) 30 tablet 1   ondansetron (ZOFRAN ODT) 4 MG disintegrating tablet Take 1 tablet (4 mg total) by mouth every 8 (eight) hours as needed for nausea or vomiting. (Patient not taking: Reported on 12/06/2022) 20 tablet 0   ondansetron (ZOFRAN-ODT) 4  MG disintegrating tablet Take 1 tablet (4 mg total) by mouth every 8 (eight) hours as needed for up to 15 doses for vomiting or nausea. 15 tablet 0   oxyCODONE (OXY IR/ROXICODONE) 5 MG immediate release tablet Take 1 tablet (5 mg total) by mouth every 6 (six) hours as needed. 10 tablet 0   Oxycodone HCl 10 MG TABS Take 10 mg by mouth in the morning, at noon, and at bedtime.     potassium chloride (MICRO-K) 10 MEQ CR capsule Take 10 mEq by mouth daily.     promethazine (PHENERGAN) 25 MG suppository Place 1 suppository (25 mg total)  rectally every 8 (eight) hours as needed for up to 6 doses for refractory nausea / vomiting. (Patient not taking: Reported on 12/06/2022) 6 each 0   PROMETHEGAN 50 MG suppository Place 50 mg rectally every 6 (six) hours as needed for vomiting or nausea.     rosuvastatin (CRESTOR) 10 MG tablet Take 10 mg by mouth See admin instructions. Take 10 mg by mouth for one week each month     sodium bicarbonate 650 MG tablet Take 1 tablet (650 mg total) by mouth 3 (three) times daily. (Patient not taking: Reported on 12/06/2022) 30 tablet 0   spironolactone (ALDACTONE) 50 MG tablet Take 50 mg by mouth daily.     tirzepatide (MOUNJARO) 15 MG/0.5ML Pen Inject 15 mg into the skin once a week. Injection on Fridays     tizanidine (ZANAFLEX) 6 MG capsule Take 6 mg by mouth 3 (three) times daily.     traZODone (DESYREL) 100 MG tablet Take 1 tablet (100 mg total) by mouth at bedtime as needed for sleep. (Patient not taking: Reported on 12/06/2022) 30 tablet 2   Vitamin D, Ergocalciferol, (DRISDOL) 1.25 MG (50000 UNIT) CAPS capsule Take 50,000 Units by mouth once a week.     zolpidem (AMBIEN) 10 MG tablet Take 10 mg by mouth at bedtime.     No current facility-administered medications for this visit.      ___________________________________________________________________ Objective   Exam:  There were no vitals taken for this visit. Wt Readings from Last 3 Encounters:  12/11/22 209 lb  (94.8 kg)  12/08/22 209 lb 1.6 oz (94.8 kg)  11/11/22 215 lb (97.5 kg)    General: ***  Eyes: sclera anicteric, no redness ENT: oral mucosa moist without lesions, no cervical or supraclavicular lymphadenopathy CV: ***, no JVD, no peripheral edema Resp: clear to auscultation bilaterally, normal RR and effort noted GI: soft, *** tenderness, with active bowel sounds. No guarding or palpable organomegaly noted. Skin; warm and dry, no rash or jaundice noted Neuro: awake, alert and oriented x 3. Normal gross motor function and fluent speech  Labs:     Latest Ref Rng & Units 12/08/2022   11:50 AM 11/19/2022    3:54 PM 11/11/2022    6:55 PM  CBC  WBC 4.0 - 10.5 K/uL 10.2  21.0  15.7   Hemoglobin 12.0 - 15.0 g/dL 60.4  54.0  98.1   Hematocrit 36.0 - 46.0 % 35.8  45.4  42.4   Platelets 150 - 400 K/uL 258  384  281       Latest Ref Rng & Units 12/08/2022   11:50 AM 11/19/2022    3:54 PM 11/11/2022    6:55 PM  CMP  Glucose 70 - 99 mg/dL 191  478  295   BUN 6 - 20 mg/dL 5  25  10    Creatinine 0.44 - 1.00 mg/dL 6.21  3.08  6.57   Sodium 135 - 145 mmol/L 134  135  134   Potassium 3.5 - 5.1 mmol/L 3.2  3.2  3.5   Chloride 98 - 111 mmol/L 102  100  100   CO2 22 - 32 mmol/L 23  21  22    Calcium 8.9 - 10.3 mg/dL 7.8  9.1  9.2   Total Protein 6.5 - 8.1 g/dL  8.1  7.8   Total Bilirubin 0.3 - 1.2 mg/dL  0.7  1.1   Alkaline Phos 38 - 126 U/L  68  64   AST 15 - 41  U/L  24  29   ALT 0 - 44 U/L  28  29      Radiologic Studies:  ***  Assessment: No diagnosis found.  ***  Plan:  ***  Thank you for the courtesy of this consult.  Please call me with any questions or concerns.  Charlie Pitter III  CC: Referring provider noted above

## 2023-01-29 NOTE — Patient Instructions (Signed)
Please see your primary care.  _______________________________________________________  If your blood pressure at your visit was 140/90 or greater, please contact your primary care physician to follow up on this.  _______________________________________________________  If you are age 55 or older, your body mass index should be between 23-30. Your Body mass index is 32.6 kg/m. If this is out of the aforementioned range listed, please consider follow up with your Primary Care Provider.  If you are age 45 or younger, your body mass index should be between 19-25. Your Body mass index is 32.6 kg/m. If this is out of the aformentioned range listed, please consider follow up with your Primary Care Provider.   ________________________________________________________  The [facility] providers would like to encourage you to use [facility] to communicate with providers for non-urgent requests or questions.  Due to long hold times on the telephone, sending your provider a message by [facility] may be a faster and more efficient way to get a response.  Please allow 48 business hours for a response.  Please remember that this is for non-urgent requests.  _______________________________________________________ It was a pleasure to see you today!  Thank you for trusting me with your gastrointestinal care!

## 2023-04-16 ENCOUNTER — Other Ambulatory Visit: Payer: MEDICAID

## 2023-04-16 ENCOUNTER — Telehealth: Payer: Self-pay

## 2023-04-16 DIAGNOSIS — E1165 Type 2 diabetes mellitus with hyperglycemia: Secondary | ICD-10-CM

## 2023-04-16 NOTE — Telephone Encounter (Signed)
 Orders Placed This Encounter  Procedures   Comprehensive metabolic panel   Lipid panel   Microalbumin / creatinine urine ratio   Hemoglobin A1c

## 2023-04-19 ENCOUNTER — Ambulatory Visit (INDEPENDENT_AMBULATORY_CARE_PROVIDER_SITE_OTHER): Payer: MEDICAID | Admitting: "Endocrinology

## 2023-04-19 ENCOUNTER — Encounter: Payer: Self-pay | Admitting: "Endocrinology

## 2023-04-19 VITALS — BP 120/70 | HR 82 | Ht 66.0 in | Wt 216.2 lb

## 2023-04-19 DIAGNOSIS — Z794 Long term (current) use of insulin: Secondary | ICD-10-CM | POA: Diagnosis not present

## 2023-04-19 DIAGNOSIS — Z7985 Long-term (current) use of injectable non-insulin antidiabetic drugs: Secondary | ICD-10-CM

## 2023-04-19 DIAGNOSIS — E7800 Pure hypercholesterolemia, unspecified: Secondary | ICD-10-CM

## 2023-04-19 DIAGNOSIS — E1165 Type 2 diabetes mellitus with hyperglycemia: Secondary | ICD-10-CM

## 2023-04-19 LAB — POCT GLYCOSYLATED HEMOGLOBIN (HGB A1C): Hemoglobin A1C: 7.4 % — AB (ref 4.0–5.6)

## 2023-04-19 MED ORDER — METFORMIN HCL ER 500 MG PO TB24: 500.0000 mg | ORAL_TABLET | Freq: Two times a day (BID) | ORAL | 2 refills | Status: DC

## 2023-04-19 MED ORDER — DEXCOM G7 SENSOR MISC: 1.0000 | 1 refills | Status: AC

## 2023-04-19 NOTE — Progress Notes (Signed)
Outpatient Endocrinology Note Catherine Virgil, MD  04/19/23   Catherine Pope [facility] 1966-02-27 [id number]  Referring Provider: Courtney Paris, NP Primary Care Provider: Courtney Paris, NP Reason for consultation: Subjective   Assessment & Plan  Diagnoses and all orders for this visit:  Uncontrolled type 2 diabetes mellitus with hyperglycemia, without long-term current use of insulin (HCC) -     POCT glycosylated hemoglobin (Hb A1C) -     Lipid panel -     Microalbumin / creatinine urine ratio -     C-peptide -     Comprehensive metabolic panel  Long-term insulin use (HCC)  Long-term (current) use of injectable non-insulin antidiabetic drugs  Pure hypercholesterolemia  Other orders -     Continuous Glucose Sensor (DEXCOM G7 SENSOR) MISC; 1 Device by Does not apply route continuous. -     metFORMIN (GLUCOPHAGE-XR) 500 MG 24 hr tablet; Take 1 tablet (500 mg total) by mouth 2 (two) times daily with a meal.    Diabetes Type II complicated by hyperglycemia, No results found for: "GFR" Hba1c goal less than 7, current Hba1c is  Lab Results  Component Value Date   HGBA1C 7.4 (A) 04/19/2023   Will recommend the following: Continue mounjaro 15mg /week Continue lantus 15 units units  We will start you on an extended release version of metformin, which should help with the upset stomach. Start with one 500 mg capsule taken every evening with dinner. Stay on this for 3-5 days, then increase to 1 tablet with breakfast and one with dinner. If you do not have any upset stomach, gradually increase to the goal dose of 2 capsules with breakfast and 2 capsules with dinner. If you do have upset stomach, decrease it back to the previous dose that you tolerated.  Week 1: one tablet after dinner Week 2: one tablet after breakfast and one after dinner  Week 3: one tablet after breakfast and two after dinner  Week 4 and onwards: two tablets after breakfast and two after dinner  Ordered dexcom  sensor No log, given log to write BG tidac No history of MEN syndrome/medullary thyroid cancer/pancreatitis or pancreatic cancer in self or family No known contraindications/side effects to any of above medications  -Last LD and Tg are as follows: Lab Results  Component Value Date   LDLCALC 87 08/02/2017    Lab Results  Component Value Date   TRIG 112 08/02/2017   -On rosuvastatin 10 mg QD -Follow low fat diet and exercise   -Blood pressure goal <140/90 - Microalbumin/creatinine goal is < 30 -Last MA/Cr is as follows: Lab Results  Component Value Date   MICROALBUR 8.1 11/30/2015   -not on ACE/ARB  -diet changes including salt restriction -limit eating outside -counseled BP targets per standards of diabetes care -uncontrolled blood pressure can lead to retinopathy, nephropathy and cardiovascular and atherosclerotic heart disease  Reviewed and counseled on: -A1C target -Blood sugar targets -Complications of uncontrolled diabetes  -Checking blood sugar before meals and bedtime and bring log next visit -All medications with mechanism of action and side effects -Hypoglycemia management: rule of 15's, Glucagon Emergency Kit and medical alert ID -low-carb low-fat plate-method diet -At least 20 minutes of physical activity per day -Annual dilated retinal eye exam and foot exam -compliance and follow up needs -follow up as scheduled or earlier if problem gets worse  Call if blood sugar is less than 70 or consistently above 250    Take a 15 gm snack of carbohydrate at  bedtime before you go to sleep if your blood sugar is less than 100.    If you are going to fast after midnight for a test or procedure, ask your physician for instructions on how to reduce/decrease your insulin dose.    Call if blood sugar is less than 70 or consistently above 250  -Treating a low sugar by rule of 15  (15 gms of sugar every 15 min until sugar is more than 70) If you feel your sugar is low,  test your sugar to be sure If your sugar is low (less than 70), then take 15 grams of a fast acting Carbohydrate (3-4 glucose tablets or glucose gel or 4 ounces of juice or regular soda) Recheck your sugar 15 min after treating low to make sure it is more than 70 If sugar is still less than 70, treat again with 15 grams of carbohydrate          Don't drive the hour of hypoglycemia  If unconscious/unable to eat or drink by mouth, use glucagon injection or nasal spray baqsimi and call 911. Can repeat again in 15 min if still unconscious.  Return in about 4 weeks (around 05/17/2023) for visit and 8 am labs before next visit.   I have reviewed current medications, nurse's notes, allergies, vital signs, past medical and surgical history, family medical history, and social history for this encounter. Counseled patient on symptoms, examination findings, lab findings, imaging results, treatment decisions and monitoring and prognosis. The patient understood the recommendations and agrees with the treatment plan. All questions regarding treatment plan were fully answered.  Catherine [place], MD  04/19/23    History of Present Illness Catherine Pope is a 58 y.o. year old female who presents for evaluation of Type II diabetes mellitus.  Catherine Pope was first diagnosed around 2013.   Diabetes education +  Home diabetes regimen: Mounjaro 15 mg/week-lsot 62 lbs, upto 201 lbs, now gaining back Lantus 15 unit qd  COMPLICATIONS -  MI/Stroke -  retinopathy -  neuropathy -  nephropathy  SYMPTOMS REVIEWED - Polyuria - Weight loss - Blurred vision  BLOOD SUGAR DATA No meter Last checked 3 days ago   Physical Exam  BP 120/70   Pulse 82   Ht 5\' 6"  (1.676 m)   Wt 216 lb 3.2 oz (98.1 kg)   SpO2 98%   BMI 34.90 kg/m    Constitutional: well developed, well nourished Head: normocephalic, atraumatic Eyes: sclera anicteric, no redness Neck: supple Lungs: normal respiratory  effort Neurology: alert and oriented Skin: dry, no appreciable rashes Musculoskeletal: no appreciable defects Psychiatric: normal mood and affect Diabetic Foot Exam - Simple   No data filed      Current Medications Patient's Medications  New Prescriptions   CONTINUOUS GLUCOSE SENSOR (DEXCOM G7 SENSOR) MISC    1 Device by Does not apply route continuous.   METFORMIN (GLUCOPHAGE-XR) 500 MG 24 HR TABLET    Take 1 tablet (500 mg total) by mouth 2 (two) times daily with a meal.  Previous Medications   BUPROPION (WELLBUTRIN XL) 300 MG 24 HR TABLET    Take 300 mg by mouth daily.   CETIRIZINE (ZYRTEC) 10 MG TABLET    Take 10 mg by mouth daily as needed for allergies.   FAMOTIDINE (PEPCID) 40 MG TABLET    Take 40 mg by mouth 2 (two) times daily.   FLUTICASONE (FLONASE) 50 MCG/ACT NASAL SPRAY    Place 2 sprays into both  nostrils daily as needed for allergies or rhinitis.   INSULIN GLARGINE (LANTUS SOLOSTAR) 100 UNIT/ML SOLOSTAR PEN    Inject 15 Units into the skin daily.   INSULIN PEN NEEDLE (PEN NEEDLES 3/16") 31G X 5 MM MISC    Lantus 15units daily   LANCETS (ONETOUCH ULTRASOFT) LANCETS    Use as instructed   METOCLOPRAMIDE (REGLAN) 10 MG TABLET    Take 1 tablet (10 mg total) by mouth every 6 (six) hours.   OXYCODONE HCL 10 MG TABS    Take 10 mg by mouth in the morning, at noon, and at bedtime.   ROSUVASTATIN (CRESTOR) 10 MG TABLET    Take 10 mg by mouth See admin instructions. Take 10 mg by mouth for one week each month   SPIRONOLACTONE (ALDACTONE) 50 MG TABLET    Take 50 mg by mouth daily.   TIRZEPATIDE (MOUNJARO) 15 MG/0.5ML PEN    Inject 15 mg into the skin once a week. Injection on Fridays   TIZANIDINE (ZANAFLEX) 6 MG CAPSULE    Take 6 mg by mouth 3 (three) times daily.   VITAMIN D, ERGOCALCIFEROL, (DRISDOL) 1.25 MG (50000 UNIT) CAPS CAPSULE    Take 50,000 Units by mouth once a week.   ZOLPIDEM (AMBIEN) 10 MG TABLET    Take 10 mg by mouth at bedtime.  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   per pt tested a month ago/ no results yet   Hypertension    Migraine    Neck pain    Seasonal allergies    Sinusitis     Past Surgical History Past Surgical History:  Procedure Laterality Date   ABDOMINAL HYSTERECTOMY     per pt , in her 84's   ANTERIOR CERVICAL DECOMP/DISCECTOMY FUSION N/A 12/27/2016   Procedure: CERVICAL SIX-SEVEN ANTERIOR CERVICAL DECOMPRESSION/DISCECTOMY FUSION;  Surgeon: Tia Alert, MD;  Location: [facility] OR;  Service: Neurosurgery;  Laterality: N/A;   ANTERIOR CRUCIATE LIGAMENT REPAIR     right knee   BREAST EXCISIONAL BIOPSY Left over 20 years ago   benign   BREAST SURGERY     2 lumps removed at age 65, left breast/ benign   CHOLECYSTECTOMY N/A 12/11/2022   Procedure: LAPAROSCOPIC CHOLECYSTECTOMY;  Surgeon: [name], MD;  Location: [facility] OR;  Service: General;  Laterality: N/A;   DENTAL SURGERY  2021   RADIOLOGY WITH ANESTHESIA Left 12/11/2017   Procedure: MRI cervical spine and left shoulder WITH ANESTHESIA;  Surgeon: Radiologist, Medication, MD;  Location: [facility] OR;  Service: Radiology;  Laterality: Left;   SHOULDER ARTHROSCOPY     right shoulder    Family History family history includes Diabetes in her father; Heart Problems in her father; Heart disease in an other family member; Hypertension in her mother and another family member; Kidney disease in her brother; Stroke in her sister.  Social History Social History   Socioeconomic History   Marital status: Single    Spouse name: Not on file   Number of children: 5   Years of education: 12 th   Highest education level:  Not  on file  Occupational History   Occupation: unemployed  Tobacco Use   Smoking status: Former    Current packs/day: 0.50    Average packs/day: 0.5 packs/day for 15.0 years (7.5 ttl pk-yrs)    Types: Cigarettes   Smokeless tobacco: Never   Tobacco comments:    less than half pack a day.  Vaping Use   Vaping status: Never Used  Substance and Sexual Activity   Alcohol use: No   Drug use: No   Sexual activity: Not Currently  Other Topics Concern   Not on file  Social History Narrative   Lives at home with children (2 biological, 3 adopted).   Right handed.    Caffeine: three cups daily.   Social Drivers of Corporate investment banker Strain: High Risk (03/21/2018)   Overall Financial Resource Strain (CARDIA)    Difficulty of Paying Living Expenses: Very hard  Food Insecurity: No Food Insecurity (10/22/2022)   Hunger Vital Sign    Worried About Running Out of Food in the Last Year: Never true    Ran Out of Food in the Last Year: Never true  Transportation Needs: No Transportation Needs (10/22/2022)   PRAPARE - Administrator, Civil Service (Medical): No    Lack of Transportation (Non-Medical): No  Physical Activity: Inactive (03/21/2018)   Exercise Vital Sign    Days of Exercise per Week: 0 days    Minutes of Exercise per Session: 0 min  Stress: Stress Concern Present (03/21/2018)   [employer] of Occupational Health - Occupational Stress Questionnaire    Feeling of Stress : Very much  Social Connections: Moderately Isolated (03/21/2018)   Social Connection and Isolation Panel [NHANES]    Frequency of Communication with Friends and Family: More than three times a week    Frequency of Social Gatherings with Friends and Family: Never    Attends Religious Services: Never    Database administrator or Organizations: No    Attends Banker Meetings: Never    Marital Status: Never married  Intimate Partner Violence: Not At Risk (10/22/2022)    Humiliation, Afraid, Rape, and Kick questionnaire    Fear of Current or Ex-Partner: No    Emotionally Abused: No    Physically Abused: No    Sexually Abused: No    Lab Results  Component Value Date   HGBA1C 7.4 (A) 04/19/2023   HGBA1C 11.8 (H) 10/14/2022   HGBA1C 7.2 (A) 06/06/2019   Lab Results  Component Value Date   CHOL 143 08/02/2017   Lab Results  Component Value Date   HDL 34 (L) 08/02/2017   Lab Results  Component Value Date   LDLCALC 87 08/02/2017   Lab Results  Component Value Date   TRIG 112 08/02/2017   Lab Results  Component Value Date   CHOLHDL 4.2 08/02/2017   Lab Results  Component Value Date   CREATININE 0.72 12/08/2022   No results found for: "GFR" Lab Results  Component Value Date   MICROALBUR 8.1 11/30/2015      Component Value Date/Time   NA 134 (L) 12/08/2022 1150   NA 142 01/28/2018 1017   K 3.2 (L) 12/08/2022 1150   CL 102 12/08/2022 1150   CO2 23 12/08/2022 1150   GLUCOSE 390 (H) 12/08/2022 1150   BUN <5 (L) 12/08/2022 1150   BUN 5 (L) 01/28/2018 1017   CREATININE 0.72 12/08/2022 1150   CREATININE 0.80 10/17/2016 1107   CALCIUM  7.8 (L) 12/08/2022 1150   PROT 8.1 11/19/2022 1554   PROT 7.3 01/28/2018 1017   ALBUMIN 3.8 11/19/2022 1554   ALBUMIN 4.3 01/28/2018 1017   AST 24 11/19/2022 1554   ALT 28 11/19/2022 1554   ALKPHOS 68 11/19/2022 1554   BILITOT 0.7 11/19/2022 1554   BILITOT 0.3 01/28/2018 1017   GFRNONAA >60 12/08/2022 1150   GFRNONAA >89 07/25/2016 1126   GFRAA 103 01/28/2018 1017   GFRAA >89 07/25/2016 1126      Latest Ref Rng & Units 12/08/2022   11:50 AM 11/19/2022    3:54 PM 11/11/2022    6:55 PM  BMP  Glucose 70 - 99 mg/dL 865  784  696   BUN 6 - 20 mg/dL 5  25  10    Creatinine 0.44 - 1.00 mg/dL 2.95  2.84  1.32   Sodium 135 - 145 mmol/L 134  135  134   Potassium 3.5 - 5.1 mmol/L 3.2  3.2  3.5   Chloride 98 - 111 mmol/L 102  100  100   CO2 22 - 32 mmol/L 23  21  22    Calcium 8.9 - 10.3 mg/dL 7.8  9.1   9.2        Component Value Date/Time   WBC 10.2 12/08/2022 1150   RBC 4.10 12/08/2022 1150   HGB 11.8 (L) 12/08/2022 1150   HGB 13.6 01/28/2018 1017   HCT 35.8 (L) 12/08/2022 1150   HCT 40.5 01/28/2018 1017   PLT 258 12/08/2022 1150   PLT 280 01/28/2018 1017   MCV 87.3 12/08/2022 1150   MCV 88 01/28/2018 1017   MCH 28.8 12/08/2022 1150   MCHC 33.0 12/08/2022 1150   RDW 13.7 12/08/2022 1150   RDW 13.7 01/28/2018 1017   LYMPHSABS 6.1 (H) 11/19/2022 1554   LYMPHSABS 3.3 (H) 01/28/2018 1017   MONOABS 1.5 (H) 11/19/2022 1554   EOSABS 0.1 11/19/2022 1554   EOSABS 0.1 01/28/2018 1017   BASOSABS 0.1 11/19/2022 1554   BASOSABS 0.0 01/28/2018 1017     Parts of this note may have been dictated using voice recognition software. There may be variances in spelling and vocabulary which are unintentional. Not all errors are proofread. Please notify the Thereasa Parkin if any discrepancies are noted or if the meaning of any statement is not clear.

## 2023-04-19 NOTE — Patient Instructions (Addendum)
We will start you on an extended release version of metformin, which should help with the upset stomach. Start with one 500 mg capsule taken every evening with dinner. Stay on this for 3-5 days, then increase to 1 tablet with breakfast and one with dinner. If you do not have any upset stomach, gradually increase to the goal dose of 2 capsules with breakfast and 2 capsules with dinner. If you do have upset stomach, decrease it back to the previous dose that you tolerated.  Week 1: one tablet after dinner Week 2: one tablet after breakfast and one after dinner  Week 3: one tablet after breakfast and two after dinner  Week 4 and onwards: two tablets after breakfast and two after dinner   __________ Goals of DM therapy:  Morning Fasting blood sugar: 80-140  Blood sugar before meals: 80-140 Bed time blood sugar: 100-150  A1C <7%, limited only by hypoglycemia  1.Diabetes medications and their side effects discussed, including hypoglycemia    2. Check blood glucose:  a) Always check blood sugars before driving. Please see below (under hypoglycemia) on how to manage b) Check a minimum of 3 times/day or more as needed when having symptoms of hypoglycemia.   c) Try to check blood glucose before sleeping/in the middle of the night to ensure that it is remaining stable and not dropping less than 100 d) Check blood glucose more often if sick  3. Diet: a) 3 meals per day schedule b: Restrict carbs to 60-70 grams (4 servings) per meal c) Colorful vegetables - 3 servings a day, and low sugar fruit 2 servings/day Plate control method: 1/4 plate protein, 1/4 starch, 1/2 green, yellow, or red vegetables d) Avoid carbohydrate snacks unless hypoglycemic episode, or increased physical activity  4. Regular exercise as tolerated, preferably 3 or more hours a week  5. Hypoglycemia: a)  Do not drive or operate machinery without first testing blood glucose to assure it is over 90 mg%, or if dizzy,  lightheaded, not feeling normal, etc, or  if foot or leg is numb or weak. b)  If blood glucose less than 70, take four 5gm Glucose tabs or 15-30 gm Glucose gel.  Repeat every 15 min as needed until blood sugar is >100 mg/dl. If hypoglycemia persists then call 911.   6. Sick day management: a) Check blood glucose more often b) Continue usual therapy if blood sugars are elevated.   7. Contact the doctor immediately if blood glucose is frequently <60 mg/dl, or an episode of severe hypoglycemia occurs (where someone had to give you glucose/  glucagon or if you passed out from a low blood glucose), or if blood glucose is persistently >350 mg/dl, for further management  8. A change in level of physical activity or exercise and a change in diet may also affect your blood sugar. Check blood sugars more often and call if needed.  Instructions: 1. Bring glucose meter, blood glucose records on every visit for review 2. Continue to follow up with primary care physician and other providers for medical care 3. Yearly eye  and foot exam 4. Please get blood work done prior to the next appointment

## 2023-05-09 ENCOUNTER — Other Ambulatory Visit: Payer: MEDICAID

## 2023-05-10 LAB — COMPREHENSIVE METABOLIC PANEL
AG Ratio: 1.4 (calc) (ref 1.0–2.5)
ALT: 8 U/L (ref 6–29)
AST: 13 U/L (ref 10–35)
Albumin: 3.8 g/dL (ref 3.6–5.1)
Alkaline phosphatase (APISO): 58 U/L (ref 37–153)
BUN: 7 mg/dL (ref 7–25)
CO2: 25 mmol/L (ref 20–32)
Calcium: 9.3 mg/dL (ref 8.6–10.4)
Chloride: 106 mmol/L (ref 98–110)
Creat: 0.86 mg/dL (ref 0.50–1.03)
Globulin: 2.8 g/dL (ref 1.9–3.7)
Glucose, Bld: 201 mg/dL — ABNORMAL HIGH (ref 65–99)
Potassium: 4 mmol/L (ref 3.5–5.3)
Sodium: 139 mmol/L (ref 135–146)
Total Bilirubin: 0.4 mg/dL (ref 0.2–1.2)
Total Protein: 6.6 g/dL (ref 6.1–8.1)

## 2023-05-10 LAB — MICROALBUMIN / CREATININE URINE RATIO
Creatinine, Urine: 97 mg/dL (ref 20–275)
Microalb Creat Ratio: 2 mg/g{creat} (ref <30)
Microalb, Ur: 0.2 mg/dL

## 2023-05-10 LAB — LIPID PANEL
Cholesterol: 137 mg/dL (ref <200)
HDL: 42 mg/dL — ABNORMAL LOW (ref >=50)
LDL Cholesterol (Calc): 77 mg/dL
Non-HDL Cholesterol (Calc): 95 mg/dL (ref <130)
Total CHOL/HDL Ratio: 3.3 (calc) (ref <5.0)
Triglycerides: 94 mg/dL (ref <150)

## 2023-05-10 LAB — HEMOGLOBIN A1C
Hgb A1c MFr Bld: 7.7 %{Hb} — ABNORMAL HIGH (ref <5.7)
Mean Plasma Glucose: 174 mg/dL
eAG (mmol/L): 9.7 mmol/L

## 2023-05-10 LAB — C-PEPTIDE: C-Peptide: 4.46 ng/mL — ABNORMAL HIGH (ref 0.80–3.85)

## 2023-05-16 ENCOUNTER — Ambulatory Visit: Payer: MEDICAID | Admitting: "Endocrinology

## 2023-06-25 ENCOUNTER — Ambulatory Visit: Payer: MEDICAID | Admitting: "Endocrinology

## 2023-07-10 ENCOUNTER — Other Ambulatory Visit: Payer: Self-pay | Admitting: Nurse Practitioner

## 2023-07-10 DIAGNOSIS — Z1231 Encounter for screening mammogram for malignant neoplasm of breast: Secondary | ICD-10-CM

## 2023-07-13 ENCOUNTER — Ambulatory Visit: Payer: MEDICAID

## 2023-07-19 ENCOUNTER — Ambulatory Visit: Payer: MEDICAID

## 2023-07-25 ENCOUNTER — Inpatient Hospital Stay: Admission: RE | Admit: 2023-07-25 | Payer: MEDICAID | Source: Ambulatory Visit

## 2023-08-06 ENCOUNTER — Encounter: Payer: Self-pay | Admitting: "Endocrinology

## 2023-08-06 ENCOUNTER — Ambulatory Visit (INDEPENDENT_AMBULATORY_CARE_PROVIDER_SITE_OTHER): Payer: MEDICAID | Admitting: "Endocrinology

## 2023-08-06 VITALS — BP 112/70 | HR 98 | Ht 66.0 in | Wt 225.0 lb

## 2023-08-06 DIAGNOSIS — Z7985 Long-term (current) use of injectable non-insulin antidiabetic drugs: Secondary | ICD-10-CM

## 2023-08-06 DIAGNOSIS — E1165 Type 2 diabetes mellitus with hyperglycemia: Secondary | ICD-10-CM | POA: Diagnosis not present

## 2023-08-06 DIAGNOSIS — Z7984 Long term (current) use of oral hypoglycemic drugs: Secondary | ICD-10-CM | POA: Diagnosis not present

## 2023-08-06 DIAGNOSIS — E7800 Pure hypercholesterolemia, unspecified: Secondary | ICD-10-CM

## 2023-08-06 NOTE — Patient Instructions (Signed)

## 2023-08-06 NOTE — Progress Notes (Signed)
 Outpatient Endocrinology Note Jorge Newcomer, MD  08/06/23   Catherine Pope [facility] 15-Jun-1965 [id number]  Referring Provider: Earlis Glimpse, NP Primary Care Provider: Earlis Glimpse, NP Reason for consultation: Subjective   Assessment & Plan  Diagnoses and all orders for this visit:  Uncontrolled type 2 diabetes mellitus with hyperglycemia, without long-term current use of insulin  (HCC)  Long-term (current) use of injectable non-insulin  antidiabetic drugs  Long term (current) use of oral hypoglycemic drugs  Pure hypercholesterolemia   Diabetes Type II complicated by hyperglycemia, No results found for: "GFR" Hba1c goal less than 7, current Hba1c is  Lab Results  Component Value Date   HGBA1C 7.7 (H) 05/09/2023   Will recommend the following: Mounjaro 15 mg/week-lsot 62 lbs, upto 201 lbs, now gaining back Metformin  2 pills bid Self stopped lantus  15 units every day   Ordered dexcom sensor previously but patient did not get it  No log/meter brought  No history of MEN syndrome/medullary thyroid  cancer/pancreatitis or pancreatic cancer in self or family No known contraindications/side effects to any of above medications  -Last LD and Tg are as follows: Lab Results  Component Value Date   LDLCALC 77 05/09/2023    Lab Results  Component Value Date   TRIG 94 05/09/2023   -On rosuvastatin 10 mg QD -Follow low fat diet and exercise   -Blood pressure goal <140/90 - Microalbumin/creatinine goal is < 30 -Last MA/Cr is as follows: Lab Results  Component Value Date   MICROALBUR 0.2 05/09/2023   -not on ACE/ARB  -diet changes including salt restriction -limit eating outside -counseled BP targets per standards of diabetes care -uncontrolled blood pressure can lead to retinopathy, nephropathy and cardiovascular and atherosclerotic heart disease  Reviewed and counseled on: -A1C target -Blood sugar targets -Complications of uncontrolled diabetes  -Checking blood  sugar before meals and bedtime and bring log next visit -All medications with mechanism of action and side effects -Hypoglycemia management: rule of 15's, Glucagon Emergency Kit and medical alert ID -low-carb low-fat plate-method diet -At least 20 minutes of physical activity per day -Annual dilated retinal eye exam and foot exam -compliance and follow up needs -follow up as scheduled or earlier if problem gets worse  Call if blood sugar is less than 70 or consistently above 250    Take a 15 gm snack of carbohydrate at bedtime before you go to sleep if your blood sugar is less than 100.    If you are going to fast after midnight for a test or procedure, ask your physician for instructions on how to reduce/decrease your insulin  dose.    Call if blood sugar is less than 70 or consistently above 250  -Treating a low sugar by rule of 15  (15 gms of sugar every 15 min until sugar is more than 70) If you feel your sugar is low, test your sugar to be sure If your sugar is low (less than 70), then take 15 grams of a fast acting Carbohydrate (3-4 glucose tablets or glucose gel or 4 ounces of juice or regular soda) Recheck your sugar 15 min after treating low to make sure it is more than 70 If sugar is still less than 70, treat again with 15 grams of carbohydrate          Don't drive the hour of hypoglycemia  If unconscious/unable to eat or drink by mouth, use glucagon injection or nasal spray baqsimi and call 911. Can repeat again in 15 min if still unconscious.  Return in about 3 months (around 11/06/2023).   I have reviewed current medications, nurse's notes, allergies, vital signs, past medical and surgical history, family medical history, and social history for this encounter. Counseled patient on symptoms, examination findings, lab findings, imaging results, treatment decisions and monitoring and prognosis. The patient understood the recommendations and agrees with the treatment plan. All  questions regarding treatment plan were fully answered.  Jorge Newcomer, MD  08/06/23    History of Present Illness Catherine Pope is a 58 y.o. year old female who presents for evaluation of Type II diabetes mellitus.  Catherine Pope was first diagnosed around 2013.   Diabetes education +  Home diabetes regimen: Mounjaro 15 mg/week-lsot 62 lbs, upto 201 lbs, now gaining back Metformin  1 pill bid  Lantus  15 unit every day-self quit   COMPLICATIONS -  MI/Stroke -  retinopathy -  neuropathy -  nephropathy  BLOOD SUGAR DATA No meter Per recall: 159-195 Last checked a week ago   Physical Exam  BP 112/70   Pulse 98   Ht 5\' 6"  (1.676 m)   Wt 225 lb (102.1 kg)   SpO2 99%   BMI 36.32 kg/m    Constitutional: well developed, well nourished Head: normocephalic, atraumatic Eyes: sclera anicteric, no redness Neck: supple Lungs: normal respiratory effort Neurology: alert and oriented Skin: dry, no appreciable rashes Musculoskeletal: no appreciable defects Psychiatric: normal mood and affect Diabetic Foot Exam - Simple   Simple Foot Form Diabetic Foot exam was performed with the following findings: Yes 08/06/2023  8:41 AM  Visual Inspection No deformities, no ulcerations, no other skin breakdown bilaterally: Yes Sensation Testing Intact to touch and monofilament testing bilaterally: Yes Pulse Check Posterior Tibialis and Dorsalis pulse intact bilaterally: Yes Comments      Current Medications Patient's Medications  New Prescriptions   No medications on file  Previous Medications   BUPROPION  (WELLBUTRIN  XL) 300 MG 24 HR TABLET    Take 300 mg by mouth daily.   CETIRIZINE  (ZYRTEC ) 10 MG TABLET    Take 10 mg by mouth daily as needed for allergies.   CONTINUOUS GLUCOSE SENSOR (DEXCOM G7 SENSOR) MISC    1 Device by Does not apply route continuous.   FAMOTIDINE  (PEPCID ) 40 MG TABLET    Take 40 mg by mouth 2 (two) times daily.   FLUTICASONE  (FLONASE ) 50 MCG/ACT  NASAL SPRAY    Place 2 sprays into both nostrils daily as needed for allergies or rhinitis.   INSULIN  GLARGINE (LANTUS  SOLOSTAR) 100 UNIT/ML SOLOSTAR PEN    Inject 15 Units into the skin daily.   INSULIN  PEN NEEDLE (PEN NEEDLES 3/16") 31G X 5 MM MISC    Lantus  15units daily   LANCETS (ONETOUCH ULTRASOFT) LANCETS    Use as instructed   METFORMIN  (GLUCOPHAGE -XR) 500 MG 24 HR TABLET    Take 1 tablet (500 mg total) by mouth 2 (two) times daily with a meal.   METOCLOPRAMIDE  (REGLAN ) 10 MG TABLET    Take 1 tablet (10 mg total) by mouth every 6 (six) hours.   OXYCODONE  HCL 10 MG TABS    Take 10 mg by mouth in the morning, at noon, and at bedtime.   ROSUVASTATIN (CRESTOR) 10 MG TABLET    Take 10 mg by mouth See admin instructions. Take 10 mg by mouth for one week each month   SPIRONOLACTONE (ALDACTONE) 50 MG TABLET    Take 50 mg by mouth daily.   TIRZEPATIDE (MOUNJARO) 15 MG/0.5ML PEN  Inject 15 mg into the skin once a week. Injection on Fridays   TIZANIDINE  (ZANAFLEX ) 6 MG CAPSULE    Take 6 mg by mouth 3 (three) times daily.   VITAMIN D , ERGOCALCIFEROL , (DRISDOL ) 1.25 MG (50000 UNIT) CAPS CAPSULE    Take 50,000 Units by mouth once a week.   ZOLPIDEM (AMBIEN) 10 MG TABLET    Take 10 mg by mouth at bedtime.  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   per pt tested a month ago/ no results yet   Hypertension    Migraine    Neck pain    Seasonal allergies    Sinusitis     Past Surgical History Past Surgical History:  Procedure Laterality Date   ABDOMINAL HYSTERECTOMY     per pt , in her 83's   ANTERIOR CERVICAL DECOMP/DISCECTOMY FUSION N/A  12/27/2016   Procedure: CERVICAL SIX-SEVEN ANTERIOR CERVICAL DECOMPRESSION/DISCECTOMY FUSION;  Surgeon: Isadora Mar, MD;  Location: [facility] OR;  Service: Neurosurgery;  Laterality: N/A;   ANTERIOR CRUCIATE LIGAMENT REPAIR     right knee   BREAST EXCISIONAL BIOPSY Left over 20 years ago   benign   BREAST SURGERY     2 lumps removed at age 62, left breast/ benign   CHOLECYSTECTOMY N/A 12/11/2022   Procedure: LAPAROSCOPIC CHOLECYSTECTOMY;  Surgeon: Enid Harry, MD;  Location: [facility] OR;  Service: General;  Laterality: N/A;   DENTAL SURGERY  2021   RADIOLOGY WITH ANESTHESIA Left 12/11/2017   Procedure: MRI cervical spine and left shoulder WITH ANESTHESIA;  Surgeon: Radiologist, Medication, MD;  Location: [facility] OR;  Service: Radiology;  Laterality: Left;   SHOULDER ARTHROSCOPY     right shoulder    Family History family history includes Diabetes in her father; Heart Problems in her father; Heart disease in an other family member; Hypertension in her mother and another family member; Kidney disease in her brother; Stroke in her sister.  Social History Social History   Socioeconomic History   Marital status: Single    Spouse name: Not on file   Number of children: 5   Years of education: 12 th   Highest education level: Not on file  Occupational History   Occupation: unemployed  Tobacco Use   Smoking status: Former    Current packs/day: 0.50    Average packs/day: 0.5 packs/day for 15.0 years (7.5 ttl pk-yrs)    Types: Cigarettes   Smokeless tobacco: Never   Tobacco comments:    less than half pack a day.  Vaping Use   Vaping status: Never Used  Substance and Sexual Activity   Alcohol use: No   Drug use: No   Sexual activity: Not Currently  Other Topics Concern   Not on file  Social History Narrative   Lives at home with children (2 biological, 3 adopted).   Right handed.    Caffeine : three cups daily.   Social Drivers of Health   Financial Resource Strain: High Risk  (03/21/2018)   Overall Financial Resource Strain (CARDIA)    Difficulty of Paying Living Expenses: Very hard  Food Insecurity: No Food Insecurity (10/22/2022)   Hunger Vital Sign    Worried About Running Out of Food in the Last Year: Never true    Ran Out of Food in the Last Year: Never true  Transportation Needs: No Transportation Needs (10/22/2022)   PRAPARE - Administrator, Civil Service (Medical): No    Lack of Transportation (Non-Medical): No  Physical Activity: Inactive (03/21/2018)   Exercise Vital Sign    Days of Exercise per Week: 0 days    Minutes of Exercise per Session: 0 min  Stress: Stress Concern Present (03/21/2018)   [employer] of Occupational Health - Occupational Stress Questionnaire    Feeling of Stress : Very much  Social Connections: Moderately Isolated (03/21/2018)   Social Connection and Isolation Panel [NHANES]    Frequency of Communication with Friends and Family: More than three times a week    Frequency of Social Gatherings with Friends and Family: Never    Attends Religious Services: Never    Database administrator or Organizations: No    Attends Banker Meetings: Never    Marital Status: Never married  Intimate Partner Violence: Not At Risk (10/22/2022)   Humiliation, Afraid, Rape, and Kick questionnaire    Fear of Current or Ex-Partner: No    Emotionally Abused: No    Physically Abused: No    Sexually Abused: No    Lab Results  Component Value Date   HGBA1C 7.7 (H) 05/09/2023   HGBA1C 7.4 (A) 04/19/2023   HGBA1C 11.8 (H) 10/14/2022   Lab Results  Component Value Date   CHOL 137 05/09/2023   Lab Results  Component Value Date   HDL 42 (L) 05/09/2023   Lab Results  Component Value Date   LDLCALC 77 05/09/2023   Lab Results  Component Value Date   TRIG 94 05/09/2023   Lab Results  Component Value Date   CHOLHDL 3.3 05/09/2023   Lab Results  Component Value Date   CREATININE 0.86 05/09/2023   No  results found for: "GFR" Lab Results  Component Value Date   MICROALBUR 0.2 05/09/2023      Component Value Date/Time   NA 139 05/09/2023 0812   NA 142 01/28/2018 1017   K 4.0 05/09/2023 0812   CL 106 05/09/2023 0812   CO2 25 05/09/2023 0812   GLUCOSE 201 (H) 05/09/2023 0812   BUN 7 05/09/2023 0812   BUN 5 (L) 01/28/2018 1017   CREATININE 0.86 05/09/2023 0812   CALCIUM 9.3 05/09/2023 0812   PROT 6.6 05/09/2023 0812   PROT 7.3 01/28/2018 1017   ALBUMIN 3.8 11/19/2022 1554   ALBUMIN 4.3 01/28/2018 1017   AST 13 05/09/2023 0812   ALT 8 05/09/2023 0812   ALKPHOS 68 11/19/2022 1554   BILITOT 0.4 05/09/2023 0812   BILITOT 0.3 01/28/2018 1017   GFRNONAA >60 12/08/2022 1150   GFRNONAA >89 07/25/2016 1126   GFRAA 103 01/28/2018 1017   GFRAA >89 07/25/2016 1126      Latest Ref Rng & Units 05/09/2023    8:12 AM 12/08/2022   11:50 AM 11/19/2022    3:54 PM  BMP  Glucose 65 - 99 mg/dL 811  914  782   BUN 7 - 25 mg/dL 7  <5  25   Creatinine 0.50 - 1.03 mg/dL 9.56  2.13  0.86   BUN/Creat Ratio 6 - 22 (calc) SEE NOTE:     Sodium 135 - 146 mmol/L 139  134  135   Potassium 3.5 - 5.3 mmol/L 4.0  3.2  3.2   Chloride 98 - 110 mmol/L 106  102  100   CO2 20 - 32 mmol/L 25  23  21    Calcium 8.6 - 10.4 mg/dL 9.3  7.8  9.1        Component Value Date/Time   WBC 10.2 12/08/2022 1150   RBC 4.10 12/08/2022 1150   HGB 11.8 (L) 12/08/2022 1150   HGB 13.6 01/28/2018 1017   HCT 35.8 (L) 12/08/2022 1150   HCT 40.5 01/28/2018 1017   PLT 258 12/08/2022 1150   PLT 280 01/28/2018 1017   MCV 87.3 12/08/2022 1150   MCV 88 01/28/2018 1017   MCH 28.8 12/08/2022 1150   MCHC 33.0 12/08/2022 1150   RDW 13.7 12/08/2022 1150   RDW 13.7 01/28/2018 1017   LYMPHSABS 6.1 (H) 11/19/2022 1554   LYMPHSABS 3.3 (H) 01/28/2018 1017   MONOABS 1.5 (H) 11/19/2022 1554   EOSABS 0.1 11/19/2022 1554   EOSABS 0.1 01/28/2018 1017   BASOSABS 0.1 11/19/2022 1554   BASOSABS 0.0 01/28/2018 1017     Parts of this note  may have been dictated using voice recognition software. There may be variances in spelling and vocabulary which are unintentional. Not all errors are proofread. Please notify the [facility] if any discrepancies are noted or if the meaning of any statement is not clear.

## 2023-08-14 ENCOUNTER — Ambulatory Visit
Admission: RE | Admit: 2023-08-14 | Discharge: 2023-08-14 | Disposition: A | Discharge status: Home / Self Care | Payer: MEDICAID | Source: Ambulatory Visit | Attending: Nurse Practitioner | Admitting: Nurse Practitioner

## 2023-08-14 DIAGNOSIS — Z1231 Encounter for screening mammogram for malignant neoplasm of breast: Secondary | ICD-10-CM

## 2023-08-17 ENCOUNTER — Other Ambulatory Visit: Payer: Self-pay | Admitting: Nurse Practitioner

## 2023-08-17 DIAGNOSIS — R928 Other abnormal and inconclusive findings on diagnostic imaging of breast: Secondary | ICD-10-CM

## 2023-09-04 ENCOUNTER — Other Ambulatory Visit: Payer: Self-pay | Admitting: Nurse Practitioner

## 2023-09-04 ENCOUNTER — Ambulatory Visit
Admission: RE | Admit: 2023-09-04 | Discharge: 2023-09-04 | Disposition: A | Discharge status: Home / Self Care | Payer: MEDICAID | Source: Ambulatory Visit | Attending: Nurse Practitioner | Admitting: Nurse Practitioner

## 2023-09-04 DIAGNOSIS — N6489 Other specified disorders of breast: Secondary | ICD-10-CM

## 2023-09-04 DIAGNOSIS — R928 Other abnormal and inconclusive findings on diagnostic imaging of breast: Secondary | ICD-10-CM

## 2023-10-25 ENCOUNTER — Other Ambulatory Visit: Payer: Self-pay | Admitting: "Endocrinology

## 2023-10-25 NOTE — Telephone Encounter (Signed)
 Refill request complete

## 2023-11-06 ENCOUNTER — Ambulatory Visit: Payer: MEDICAID | Admitting: "Endocrinology

## 2023-11-21 ENCOUNTER — Ambulatory Visit: Payer: MEDICAID | Admitting: "Endocrinology

## 2024-01-29 ENCOUNTER — Ambulatory Visit (INDEPENDENT_AMBULATORY_CARE_PROVIDER_SITE_OTHER): Payer: MEDICAID | Admitting: "Endocrinology

## 2024-01-29 ENCOUNTER — Other Ambulatory Visit: Payer: Self-pay

## 2024-01-29 ENCOUNTER — Encounter: Payer: Self-pay | Admitting: "Endocrinology

## 2024-01-29 ENCOUNTER — Telehealth: Payer: Self-pay | Admitting: Pharmacy Technician

## 2024-01-29 VITALS — BP 98/80 | HR 80 | Ht 66.0 in | Wt 220.0 lb

## 2024-01-29 DIAGNOSIS — E7800 Pure hypercholesterolemia, unspecified: Secondary | ICD-10-CM

## 2024-01-29 DIAGNOSIS — Z794 Long term (current) use of insulin: Secondary | ICD-10-CM

## 2024-01-29 DIAGNOSIS — Z7985 Long-term (current) use of injectable non-insulin antidiabetic drugs: Secondary | ICD-10-CM

## 2024-01-29 DIAGNOSIS — Z7984 Long term (current) use of oral hypoglycemic drugs: Secondary | ICD-10-CM

## 2024-01-29 DIAGNOSIS — E1165 Type 2 diabetes mellitus with hyperglycemia: Secondary | ICD-10-CM

## 2024-01-29 LAB — POCT GLYCOSYLATED HEMOGLOBIN (HGB A1C): Hemoglobin A1C: 8.9 % — AB (ref 4.0–5.6)

## 2024-01-29 MED ORDER — ROSUVASTATIN CALCIUM 10 MG PO TABS
10.0000 mg | ORAL_TABLET | ORAL | 3 refills | Status: AC
  Filled 2024-01-29: qty 15, 90d supply, fill #0

## 2024-01-29 MED ORDER — DEXCOM G7 SENSOR MISC
1.0000 | 3 refills | Status: AC
  Filled 2024-01-29: qty 9, 90d supply, fill #0
  Filled 2024-05-01: qty 9, 90d supply, fill #1

## 2024-01-29 MED ORDER — METFORMIN HCL ER 500 MG PO TB24
1000.0000 mg | ORAL_TABLET | Freq: Two times a day (BID) | ORAL | 5 refills | Status: AC
  Filled 2024-01-29: qty 120, 30d supply, fill #0

## 2024-01-29 MED ORDER — BLOOD GLUCOSE TEST VI STRP
1.0000 | ORAL_STRIP | 0 refills | Status: AC
  Filled 2024-01-29: qty 100, 30d supply, fill #0

## 2024-01-29 NOTE — Progress Notes (Signed)
 Outpatient Endocrinology Note Catherine Birmingham, MD  01/29/24   Catherine [facility] [birth year] [id number]  Referring Provider: Leron Millman, NP Primary Care Provider: Leron Millman, NP Reason for consultation: Subjective   Assessment & Plan  Diagnoses and all orders for this visit:  Uncontrolled type 2 diabetes mellitus with hyperglycemia, without long-term current use of insulin  (HCC) -     POCT glycosylated hemoglobin (Hb A1C)  Long-term (current) use of injectable non-insulin  antidiabetic drugs  Long term (current) use of oral hypoglycemic drugs  Long-term insulin  use (HCC)  Pure hypercholesterolemia  Other orders -     Continuous Glucose Sensor (DEXCOM G7 SENSOR) MISC; 1 Device by Does not apply route continuous. -     rosuvastatin (CRESTOR) 10 MG tablet; Take 1 tablet (10 mg total) by mouth See admin instructions. Take 10 mg by mouth for one week each month -     metFORMIN  (GLUCOPHAGE -XR) 500 MG 24 hr tablet; Take 2 tabs by mouth BID   Diabetes Type II complicated by hyperglycemia, No results found for: GFR Hba1c goal less than 7, current Hba1c is  Lab Results  Component Value Date   HGBA1C 8.9 (A) 01/29/2024   Will recommend the following: Mounjaro 15 mg/week-lost 62 lbs, upto 201 lbs, now gaining back Metformin  XR 500 mg 2-3 pills a day to avoid diarrhea  Lantus  15 units every day   Ordered dexcom sensor previously but patient did not get it  No history of MEN syndrome/medullary thyroid  cancer/pancreatitis or pancreatic cancer in self or family No known contraindications/side effects to any of above medications  -Last LD and Tg are as follows: Lab Results  Component Value Date   LDLCALC 77 05/09/2023    Lab Results  Component Value Date   TRIG 94 05/09/2023   -On rosuvastatin 10 mg QD -Follow low fat diet and exercise   -Blood pressure goal <140/90 - Microalbumin/creatinine goal is < 30 -Last MA/Cr is as follows: Lab Results  Component Value  Date   MICROALBUR 0.2 05/09/2023   -not on ACE/ARB  -diet changes including salt restriction -limit eating outside -counseled BP targets per standards of diabetes care -uncontrolled blood pressure can lead to retinopathy, nephropathy and cardiovascular and atherosclerotic heart disease  Reviewed and counseled on: -A1C target -Blood sugar targets -Complications of uncontrolled diabetes  -Checking blood sugar before meals and bedtime and bring log next visit -All medications with mechanism of action and side effects -Hypoglycemia management: rule of 15's, Glucagon Emergency Kit and medical alert ID -low-carb low-fat plate-method diet -At least 20 minutes of physical activity per day -Annual dilated retinal eye exam and foot exam -compliance and follow up needs -follow up as scheduled or earlier if problem gets worse  Call if blood sugar is less than 70 or consistently above 250    Take a 15 gm snack of carbohydrate at bedtime before you go to sleep if your blood sugar is less than 100.    If you are going to fast after midnight for a test or procedure, ask your physician for instructions on how to reduce/decrease your insulin  dose.    Call if blood sugar is less than 70 or consistently above 250  -Treating a low sugar by rule of 15  (15 gms of sugar every 15 min until sugar is more than 70) If you feel your sugar is low, test your sugar to be sure If your sugar is low (less than 70), then take 15 grams of a fast  acting Carbohydrate (3-4 glucose tablets or glucose gel or 4 ounces of juice or regular soda) Recheck your sugar 15 min after treating low to make sure it is more than 70 If sugar is still less than 70, treat again with 15 grams of carbohydrate          Don't drive the hour of hypoglycemia  If unconscious/unable to eat or drink by mouth, use glucagon injection or nasal spray baqsimi and call 911. Can repeat again in 15 min if still unconscious.  Return in about 6 weeks  (around 03/11/2024).   I have reviewed current medications, nurse's notes, allergies, vital signs, past medical and surgical history, family medical history, and social history for this encounter. Counseled patient on symptoms, examination findings, lab findings, imaging results, treatment decisions and monitoring and prognosis. The patient understood the recommendations and agrees with the treatment plan. All questions regarding treatment plan were fully answered.  Catherine Birmingham, MD  01/29/24    History of Present Illness Catherine Pope is a 58 y.o. year old female who presents for follow up of Type II diabetes mellitus.  Catherine Pope was first diagnosed around 2013.   Diabetes education +  Home diabetes regimen: Mounjaro 15 mg/week-lost 62 lbs, upto 201 lbs, now gaining back Metformin  XR 500 mg 2 pills bid Lantus  15 units every day  COMPLICATIONS -  MI/Stroke -  retinopathy -  neuropathy -  nephropathy  BLOOD SUGAR DATA 91-275 range, checks 1-2 times a day   Physical Exam  BP 98/80   Pulse 80   Ht 5' 6 (1.676 m)   Wt 220 lb (99.8 kg)   SpO2 97%   BMI 35.51 kg/m    Constitutional: well developed, well nourished Head: normocephalic, atraumatic Eyes: sclera anicteric, no redness Neck: supple Lungs: normal respiratory effort Neurology: alert and oriented Skin: dry, no appreciable rashes Musculoskeletal: no appreciable defects Psychiatric: normal mood and affect Diabetic Foot Exam - Simple   No data filed      Current Medications Patient's Medications  New Prescriptions   CONTINUOUS GLUCOSE SENSOR (DEXCOM G7 SENSOR) MISC    1 Device by Does not apply route continuous.  Previous Medications   BUPROPION  (WELLBUTRIN  XL) 300 MG 24 HR TABLET    Take 300 mg by mouth daily.   CETIRIZINE  (ZYRTEC ) 10 MG TABLET    Take 10 mg by mouth daily as needed for allergies.   CONTINUOUS GLUCOSE SENSOR (DEXCOM G7 SENSOR) MISC    1 Device by Does not apply route  continuous.   FAMOTIDINE  (PEPCID ) 40 MG TABLET    Take 40 mg by mouth 2 (two) times daily.   FLUTICASONE  (FLONASE ) 50 MCG/ACT NASAL SPRAY    Place 2 sprays into both nostrils daily as needed for allergies or rhinitis.   INSULIN  GLARGINE (LANTUS  SOLOSTAR) 100 UNIT/ML SOLOSTAR PEN    Inject 15 Units into the skin daily.   INSULIN  PEN NEEDLE (PEN NEEDLES 3/16) 31G X 5 MM MISC    Lantus  15units daily   LANCETS (ONETOUCH ULTRASOFT) LANCETS    Use as instructed   METOCLOPRAMIDE  (REGLAN ) 10 MG TABLET    Take 1 tablet (10 mg total) by mouth every 6 (six) hours.   OXYCODONE  HCL 10 MG TABS    Take 10 mg by mouth in the morning, at noon, and at bedtime.   SPIRONOLACTONE (ALDACTONE) 50 MG TABLET    Take 50 mg by mouth daily.   TIRZEPATIDE (MOUNJARO) 15 MG/0.5ML PEN  Inject 15 mg into the skin once a week. Injection on Fridays   TIZANIDINE  (ZANAFLEX ) 6 MG CAPSULE    Take 6 mg by mouth 3 (three) times daily.   VITAMIN D , ERGOCALCIFEROL , (DRISDOL ) 1.25 MG (50000 UNIT) CAPS CAPSULE    Take 50,000 Units by mouth once a week.   ZOLPIDEM (AMBIEN) 10 MG TABLET    Take 10 mg by mouth at bedtime.  Modified Medications   Modified Medication Previous Medication   METFORMIN  (GLUCOPHAGE -XR) 500 MG 24 HR TABLET metFORMIN  (GLUCOPHAGE -XR) 500 MG 24 hr tablet      Take 2 tabs by mouth BID    Take 2 tabs by mouth BID   ROSUVASTATIN (CRESTOR) 10 MG TABLET rosuvastatin (CRESTOR) 10 MG tablet      Take 1 tablet (10 mg total) by mouth See admin instructions. Take 10 mg by mouth for one week each month    Take 10 mg by mouth See admin instructions. 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   per pt tested a month ago/ no results yet   Hypertension    Migraine    Neck pain    Seasonal allergies    Sinusitis     Past Surgical History Past Surgical History:  Procedure Laterality Date   ABDOMINAL HYSTERECTOMY     per pt , in her 57's   ANTERIOR CERVICAL DECOMP/DISCECTOMY FUSION N/A 12/27/2016   Procedure: CERVICAL SIX-SEVEN ANTERIOR CERVICAL DECOMPRESSION/DISCECTOMY FUSION;  Surgeon: Joshua Alm RAMAN, MD;  Location[facility] OR;  Service: Neurosurgery;  Laterality: N/A;   ANTERIOR CRUCIATE LIGAMENT REPAIR     right knee   BREAST EXCISIONAL BIOPSY Left over 20 years ago   benign   BREAST SURGERY     2 lumps removed at age 71, left breast/ benign   CHOLECYSTECTOMY N/A 12/11/2022   Procedure: LAPAROSCOPIC CHOLECYSTECTOMY;  Surgeon: Ebbie Cough, MD;  Location: [facility] OR;  Service: General;  Laterality: N/A;   DENTAL SURGERY  2021   RADIOLOGY WITH ANESTHESIA Left 12/11/2017   Procedure: MRI cervical spine and left shoulder WITH ANESTHESIA;  Surgeon: Radiologist, Medication, MD;  Location: [facility] OR;  Service: Radiology;  Laterality: Left;   SHOULDER ARTHROSCOPY     right shoulder    Family History family history includes Diabetes in her father; Heart Problems in her father; Heart disease in an other family member; Hypertension in her mother and another family member; Kidney disease in her brother; Stroke in her sister.  Social History Social History   Socioeconomic History   Marital status: Single    Spouse name: Not on file   Number of children: 5   Years of education: 12 th   Highest education level: Not on file  Occupational History   Occupation: unemployed  Tobacco Use   Smoking status: Former    Current packs/day: 0.50    Average packs/day: 0.5 packs/day for 15.0 years (7.5 ttl pk-yrs)    Types: Cigarettes   Smokeless tobacco: Never   Tobacco comments:    less than half pack a day.  Vaping Use  Vaping status: Never Used  Substance and  Sexual Activity   Alcohol use: No   Drug use: No   Sexual activity: Not Currently  Other Topics Concern   Not on file  Social History Narrative   Lives at home with children (2 biological, 3 adopted).   Right handed.    Caffeine : three cups daily.   Social Drivers of Corporate Investment Banker Strain: High Risk (03/21/2018)   Overall Financial Resource Strain (CARDIA)    Difficulty of Paying Living Expenses: Very hard  Food Insecurity: No Food Insecurity (10/22/2022)   Hunger Vital Sign    Worried About Running Out of Food in the Last Year: Never true    Ran Out of Food in the Last Year: Never true  Transportation Needs: No Transportation Needs (10/22/2022)   PRAPARE - Administrator, Civil Service (Medical): No    Lack of Transportation (Non-Medical): No  Physical Activity: Inactive (03/21/2018)   Exercise Vital Sign    Days of Exercise per Week: 0 days    Minutes of Exercise per Session: 0 min  Stress: Stress Concern Present (03/21/2018)   [employer] of Occupational Health - Occupational Stress Questionnaire    Feeling of Stress : Very much  Social Connections: Moderately Isolated (03/21/2018)   Social Connection and Isolation Panel    Frequency of Communication with Friends and Family: More than three times a week    Frequency of Social Gatherings with Friends and Family: Never    Attends Religious Services: Never    Database Administrator or Organizations: No    Attends Banker Meetings: Never    Marital Status: Never married  Intimate Partner Violence: Not At Risk (10/22/2022)   Humiliation, Afraid, Rape, and Kick questionnaire    Fear of Current or Ex-Partner: No    Emotionally Abused: No    Physically Abused: No    Sexually Abused: No    Lab Results  Component Value Date   HGBA1C 8.9 (A) 01/29/2024   HGBA1C 7.7 (H) 05/09/2023   HGBA1C 7.4 (A) 04/19/2023   Lab Results  Component Value Date   CHOL 137 05/09/2023   Lab Results   Component Value Date   HDL 42 (L) 05/09/2023   Lab Results  Component Value Date   LDLCALC 77 05/09/2023   Lab Results  Component Value Date   TRIG 94 05/09/2023   Lab Results  Component Value Date   CHOLHDL 3.3 05/09/2023   Lab Results  Component Value Date   CREATININE 0.86 05/09/2023   No results found for: GFR Lab Results  Component Value Date   MICROALBUR 0.2 05/09/2023      Component Value Date/Time   NA 139 05/09/2023 0812   NA 142 01/28/2018 1017   K 4.0 05/09/2023 0812   CL 106 05/09/2023 0812   CO2 25 05/09/2023 0812   GLUCOSE 201 (H) 05/09/2023 0812   BUN 7 05/09/2023 0812   BUN 5 (L) 01/28/2018 1017   CREATININE 0.86 05/09/2023 0812   CALCIUM 9.3 05/09/2023 0812   PROT 6.6 05/09/2023 0812   PROT 7.3 01/28/2018 1017   ALBUMIN 3.8 11/19/2022 1554   ALBUMIN 4.3 01/28/2018 1017   AST 13 05/09/2023 0812   ALT 8 05/09/2023 0812   ALKPHOS 68 11/19/2022 1554   BILITOT 0.4 05/09/2023 0812   BILITOT 0.3 01/28/2018 1017   GFRNONAA >60 12/08/2022 1150   GFRNONAA >89 07/25/2016 1126   GFRAA 103 01/28/2018 1017  GFRAA >89 07/25/2016 1126      Latest Ref Rng & Units 05/09/2023    8:12 AM 12/08/2022   11:50 AM 11/19/2022    3:54 PM  BMP  Glucose 65 - 99 mg/dL 798  609  836   BUN 7 - 25 mg/dL 7  <5  25   Creatinine 0.50 - 1.03 mg/dL 9.13  9.27  8.80   BUN/Creat Ratio 6 - 22 (calc) SEE NOTE:     Sodium 135 - 146 mmol/L 139  134  135   Potassium 3.5 - 5.3 mmol/L 4.0  3.2  3.2   Chloride 98 - 110 mmol/L 106  102  100   CO2 20 - 32 mmol/L 25  23  21    Calcium 8.6 - 10.4 mg/dL 9.3  7.8  9.1        Component Value Date/Time   WBC 10.2 12/08/2022 1150   RBC 4.10 12/08/2022 1150   HGB 11.8 (L) 12/08/2022 1150   HGB 13.6 01/28/2018 1017   HCT 35.8 (L) 12/08/2022 1150   HCT 40.5 01/28/2018 1017   PLT 258 12/08/2022 1150   PLT 280 01/28/2018 1017   MCV 87.3 12/08/2022 1150   MCV 88 01/28/2018 1017   MCH 28.8 12/08/2022 1150   MCHC 33.0 12/08/2022 1150    RDW 13.7 12/08/2022 1150   RDW 13.7 01/28/2018 1017   LYMPHSABS 6.1 (H) 11/19/2022 1554   LYMPHSABS 3.3 (H) 01/28/2018 1017   MONOABS 1.5 (H) 11/19/2022 1554   EOSABS 0.1 11/19/2022 1554   EOSABS 0.1 01/28/2018 1017   BASOSABS 0.1 11/19/2022 1554   BASOSABS 0.0 01/28/2018 1017     Parts of this note may have been dictated using voice recognition software. There may be variances in spelling and vocabulary which are unintentional. Not all errors are proofread. Please notify the dino if any discrepancies are noted or if the meaning of any statement is not clear.

## 2024-01-29 NOTE — Telephone Encounter (Signed)
 Pharmacy Patient Advocate Encounter   Received notification from CoverMyMeds that prior authorization for Dexcom G7 Sensor  is required/requested.   Insurance verification completed.   The patient is insured through [place] MEDICAID.   Per test claim: PA required; PA submitted to above mentioned insurance via Latent Key/confirmation #/EOC [id number] Status is pending

## 2024-01-29 NOTE — Patient Instructions (Signed)

## 2024-01-29 NOTE — Telephone Encounter (Signed)
 Pharmacy Patient Advocate Encounter  Received notification from [employer] [place] [employer]ICA[employer] that Prior Authorization for Dexcom G7 Sensor  has been APPROVED from 01/29/24 to 07/29/24. Ran test claim, Copay is $0.00. This test claim was processed through [facility]- copay amounts may vary at other pharmacies due to pharmacy/plan contracts, or as the patient moves through the different stages of their insurance plan.   PA #/Case ID/Reference #: [id number]

## 2024-01-30 ENCOUNTER — Other Ambulatory Visit: Payer: Self-pay

## 2024-01-31 ENCOUNTER — Other Ambulatory Visit: Payer: Self-pay

## 2024-03-06 ENCOUNTER — Other Ambulatory Visit: Payer: MEDICAID

## 2024-03-11 ENCOUNTER — Encounter: Payer: Self-pay | Admitting: "Endocrinology

## 2024-03-11 ENCOUNTER — Telehealth: Payer: MEDICAID | Admitting: "Endocrinology

## 2024-03-11 VITALS — Ht 66.0 in | Wt 220.0 lb

## 2024-03-11 DIAGNOSIS — E7800 Pure hypercholesterolemia, unspecified: Secondary | ICD-10-CM

## 2024-03-11 DIAGNOSIS — Z794 Long term (current) use of insulin: Secondary | ICD-10-CM

## 2024-03-11 DIAGNOSIS — Z7984 Long term (current) use of oral hypoglycemic drugs: Secondary | ICD-10-CM | POA: Diagnosis not present

## 2024-03-11 DIAGNOSIS — Z7985 Long-term (current) use of injectable non-insulin antidiabetic drugs: Secondary | ICD-10-CM

## 2024-03-11 DIAGNOSIS — E1165 Type 2 diabetes mellitus with hyperglycemia: Secondary | ICD-10-CM | POA: Diagnosis not present

## 2024-03-11 MED ORDER — DEXCOM G7 RECEIVER DEVI: 0 refills | Status: AC

## 2024-03-11 MED ORDER — GLIPIZIDE 2.5 MG PO TABS: 2.5000 mg | ORAL_TABLET | ORAL | 3 refills | Status: AC | PRN

## 2024-03-11 NOTE — Patient Instructions (Addendum)
 Will recommend the following: Mounjaro 15 mg/week-lost 62 lbs, upto 201 lbs, now gaining back Metformin  XR 500 mg 2 pills a day to avoid diarrhea  Lantus  14 units every day  Glipizide  2.5 mg once a day as needed for blood sugar >200    Take a 15 gm snack of carbohydrate at bedtime before you go to sleep if your blood sugar is less than 100.  If you are going to fast after midnight for a test or procedure, ask your physician for instructions on how to reduce/decrease your insulin  dose.  -Treating a low sugar by rule of 15  (15 gms of sugar every 15 min until sugar is more than 70) If you feel your sugar is low, test your sugar to be sure If your sugar is low (less than 70), then take 15 grams of a fast acting Carbohydrate (3-4 glucose tablets or glucose gel or 4 ounces of juice or regular soda) Recheck your sugar 15 min after treating low to make sure it is more than 70 If sugar is still less than 70, treat again with 15 grams of carbohydrate                Don't drive the hour of hypoglycemia  If unconscious/unable to eat or drink by mouth, use glucagon injection or nasal spray baqsimi and call 911. Can repeat again in 15 min if still unconscious.  Call your doctor if blood sugar is less than 70 or consistently above 250  _____________   Goals of DM therapy:  Morning Fasting blood sugar: 80-140  Blood sugar before meals: 80-140 Bed time blood sugar: 100-150  A1C <7%, limited only by hypoglycemia  1.Diabetes medications and their side effects discussed, including hypoglycemia    2. Check blood glucose:  a) Always check blood sugars before driving. Please see below (under hypoglycemia) on how to manage b) Check a minimum of 3 times/day or more as needed when having symptoms of hypoglycemia.   c) Try to check blood glucose before sleeping/in the middle of the night to ensure that it is remaining stable and not dropping less than 100 d) Check blood glucose more often if sick  3.  Diet: a) 3 meals per day schedule b: Restrict carbs to 60-70 grams (4 servings) per meal c) Colorful vegetables - 3 servings a day, and low sugar fruit 2 servings/day Plate control method: 1/4 plate protein, 1/4 starch, 1/2 green, yellow, or red vegetables d) Avoid carbohydrate snacks unless hypoglycemic episode, or increased physical activity  4. Regular exercise as tolerated, preferably 3 or more hours a week  5. Hypoglycemia: a)  Do not drive or operate machinery without first testing blood glucose to assure it is over 90 mg%, or if dizzy, lightheaded, not feeling normal, etc, or  if foot or leg is numb or weak. b)  If blood glucose less than 70, take four 5gm Glucose tabs or 15-30 gm Glucose gel.  Repeat every 15 min as needed until blood sugar is >100 mg/dl. If hypoglycemia persists then call 911.   6. Sick day management: a) Check blood glucose more often b) Continue usual therapy if blood sugars are elevated.   7. Contact the doctor immediately if blood glucose is frequently <60 mg/dl, or an episode of severe hypoglycemia occurs (where someone had to give you glucose/  glucagon or if you passed out from a low blood glucose), or if blood glucose is persistently >350 mg/dl, for further management  8. A  change in level of physical activity or exercise and a change in diet may also affect your blood sugar. Check blood sugars more often and call if needed.  Instructions: 1. Bring glucose meter, blood glucose records on every visit for review 2. Continue to follow up with primary care physician and other providers for medical care 3. Yearly eye  and foot exam 4. Please get blood work done prior to the next appointment

## 2024-03-11 NOTE — Progress Notes (Signed)
 The patient reports they are currently: Catherine Pope. I spent 8-9 minutes on the video with the patient on the date of service. I spent an additional 5 minutes on pre- and post-visit activities on the date of service.   The patient was physically located in [place]  or a state in which I am permitted to provide care. The patient and/or parent/guardian understood that s/he may incur co-pays and cost sharing, and agreed to the telemedicine visit. The visit was reasonable and appropriate under the circumstances given the patient's presentation at the time.  The patient and/or parent/guardian has been advised of the potential risks and limitations of this mode of treatment (including, but not limited to, the absence of in-person examination) and has agreed to be treated using telemedicine. The patient's/patient's family's questions regarding telemedicine have been answered.   The patient and/or parent/guardian has also been advised to contact their provider's office for worsening conditions, and seek emergency medical treatment and/or call 911 if the patient deems either necessary.    Outpatient Endocrinology Note Catherine Birmingham, MD  03/11/24   Catherine Pope Catherine Pope 56-Feb-1967 [id number]  Referring Provider: Leron Millman, NP Primary Care Provider: Leron Millman, NP Reason for consultation: Subjective   Assessment & Plan  There are no diagnoses linked to this encounter.  Diabetes Type II complicated by hyperglycemia, No results found for: GFR Hba1c goal less than 7, current Hba1c is  Lab Results  Component Value Date   HGBA1C 8.9 (A) 01/29/2024   Will recommend the following: Mounjaro 15 mg/week-lost 62 lbs, upto 201 lbs, now gaining back Metformin  XR 500 mg 2 pills a day to avoid diarrhea  Lantus  14 units every day  Glipizide  2.5 mg once a day as needed for blood sugar >200   Ordered dexcom sensor previously but patient did not get it  No history of MEN syndrome/medullary thyroid   cancer/pancreatitis or pancreatic cancer in self or family No known contraindications/side effects to any of above medications  -Last LD and Tg are as follows: Lab Results  Component Value Date   LDLCALC 77 05/09/2023    Lab Results  Component Value Date   TRIG 94 05/09/2023   -On rosuvastatin  10 mg QD -Follow low fat diet and exercise   -Blood pressure goal <140/90 - Microalbumin/creatinine goal is < 30 -Last MA/Cr is as follows: Lab Results  Component Value Date   MICROALBUR 0.2 05/09/2023   -not on ACE/ARB  -diet changes including salt restriction -limit eating outside -counseled BP targets per standards of diabetes care -uncontrolled blood pressure can lead to retinopathy, nephropathy and cardiovascular and atherosclerotic heart disease  Reviewed and counseled on: -A1C target -Blood sugar targets -Complications of uncontrolled diabetes  -Checking blood sugar before meals and bedtime and bring log next visit -All medications with mechanism of action and side effects -Hypoglycemia management: rule of 15's, Glucagon Emergency Kit and medical alert ID -low-carb low-fat plate-method diet -At least 20 minutes of physical activity per day -Annual dilated retinal eye exam and foot exam -compliance and follow up needs -follow up as scheduled or earlier if problem gets worse  Call if blood sugar is less than 70 or consistently above 250    Take a 15 gm snack of carbohydrate at bedtime before you go to sleep if your blood sugar is less than 100.    If you are going to fast after midnight for a test or procedure, ask your physician for instructions on how to reduce/decrease your insulin  dose.  Call if blood sugar is less than 70 or consistently above 250  -Treating a low sugar by rule of 15  (15 gms of sugar every 15 min until sugar is more than 70) If you feel your sugar is low, test your sugar to be sure If your sugar is low (less than 70), then take 15 grams of a fast  acting Carbohydrate (3-4 glucose tablets or glucose gel or 4 ounces of juice or regular soda) Recheck your sugar 15 min after treating low to make sure it is more than 70 If sugar is still less than 70, treat again with 15 grams of carbohydrate          Don't drive the hour of hypoglycemia  If unconscious/unable to eat or drink by mouth, use glucagon injection or nasal spray baqsimi and call 911. Can repeat again in 15 min if still unconscious.  Return in about 6 weeks (around 04/22/2024).   I have reviewed current medications, nurse's notes, allergies, vital signs, past medical and surgical history, family medical history, and social history for this encounter. Counseled patient on symptoms, examination findings, lab findings, imaging results, treatment decisions and monitoring and prognosis. The patient understood the recommendations and agrees with the treatment plan. All questions regarding treatment plan were fully answered.  Catherine Birmingham, MD  03/11/24    History of Present Illness Catherine Pope is a 58 y.o. year old female who presents for follow up of Type II diabetes mellitus.  Catherine Pope was first diagnosed around 2013.   Diabetes education +  Home diabetes regimen: Mounjaro 15 mg/week-lost 62 lbs, upto 201 lbs, now gaining back Metformin  XR 500 mg 2 pills bid Lantus  15 units every day  COMPLICATIONS -  MI/Stroke -  retinopathy -  neuropathy -  nephropathy  CGM interpretation: At today's visit, we reviewed her CGM downloads. The full report is scanned in the media. Reviewing the CGM trends, BG are well controlled across the day, with rare highs after lunch around 2 pm and some highs after dinner around 9pm.  Range per recall: 63-141  Physical Exam  Ht 5' 6 (1.676 m)   Wt 220 lb (99.8 kg)   BMI 35.51 kg/m    Constitutional: well developed, well nourished Head: normocephalic, atraumatic Eyes: sclera anicteric, no redness Neck: supple Lungs: normal  respiratory effort Neurology: alert and oriented Skin: dry, no appreciable rashes Musculoskeletal: no appreciable defects Psychiatric: normal mood and affect Diabetic Foot Exam - Simple   No data filed      Current Medications Patient's Medications  New Prescriptions   GLIPIZIDE  2.5 MG TABS    Take 2.5 mg by mouth as needed (for blood sugar more than 200).  Previous Medications   BUPROPION  (WELLBUTRIN  XL) 300 MG 24 HR TABLET    Take 300 mg by mouth daily.   CETIRIZINE  (ZYRTEC ) 10 MG TABLET    Take 10 mg by mouth daily as needed for allergies.   CONTINUOUS GLUCOSE SENSOR (DEXCOM G7 SENSOR) MISC    1 Device by Does not apply route continuous.   CONTINUOUS GLUCOSE SENSOR (DEXCOM G7 SENSOR) MISC    Change sensor every 10 days   FAMOTIDINE  (PEPCID ) 40 MG TABLET    Take 40 mg by mouth 2 (two) times daily.   FLUTICASONE  (FLONASE ) 50 MCG/ACT NASAL SPRAY    Place 2 sprays into both nostrils daily as needed for allergies or rhinitis.   GLUCOSE BLOOD (BLOOD GLUCOSE TEST STRIPS) STRP  Use up to four times daily as directed. (FOR ICD-10 E10.9, E11.9).   INSULIN  GLARGINE (LANTUS  SOLOSTAR) 100 UNIT/ML SOLOSTAR PEN    Inject 15 Units into the skin daily.   INSULIN  PEN NEEDLE (PEN NEEDLES 3/16) 31G X 5 MM MISC    Lantus  15units daily   LANCETS (ONETOUCH ULTRASOFT) LANCETS    Use as instructed   METFORMIN  (GLUCOPHAGE -XR) 500 MG 24 HR TABLET    Take 2 tablets (1,000 mg total) by mouth 2 (two) times daily.   METOCLOPRAMIDE  (REGLAN ) 10 MG TABLET    Take 1 tablet (10 mg total) by mouth every 6 (six) hours.   OXYCODONE  HCL 10 MG TABS    Take 10 mg by mouth in the morning, at noon, and at bedtime.   ROSUVASTATIN  (CRESTOR ) 10 MG TABLET    Take 1 tablet (10 mg total) by mouth for one week each month.   SPIRONOLACTONE (ALDACTONE) 50 MG TABLET    Take 50 mg by mouth daily.   TIRZEPATIDE (MOUNJARO) 15 MG/0.5ML PEN    Inject 15 mg into the skin once a week. Injection on Fridays   TIZANIDINE  (ZANAFLEX ) 6 MG  CAPSULE    Take 6 mg by mouth 3 (three) times daily.   VITAMIN D , ERGOCALCIFEROL , (DRISDOL ) 1.25 MG (50000 UNIT) CAPS CAPSULE    Take 50,000 Units by mouth once a week.   ZOLPIDEM (AMBIEN) 10 MG TABLET    Take 10 mg by mouth at bedtime.  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   per pt tested a month ago/ no results yet   Hypertension    Migraine    Neck pain    Seasonal allergies    Sinusitis     Past Surgical History Past Surgical History:  Procedure Laterality Date   ABDOMINAL HYSTERECTOMY     per pt , in her 34's   ANTERIOR CERVICAL DECOMP/DISCECTOMY FUSION N/A 12/27/2016   Procedure: CERVICAL SIX-SEVEN ANTERIOR CERVICAL DECOMPRESSION/DISCECTOMY FUSION;  Surgeon: Joshua Alm RAMAN, MD;  Location: [facility] OR;  Service: Neurosurgery;  Laterality: N/A;   ANTERIOR CRUCIATE LIGAMENT REPAIR     right knee   BREAST EXCISIONAL BIOPSY Left over 20 years ago   benign   BREAST SURGERY     2 lumps removed at age 77, left breast/ benign   CHOLECYSTECTOMY N/A 12/11/2022   Procedure: LAPAROSCOPIC CHOLECYSTECTOMY;  Surgeon: Ebbie Cough, MD;  Location: [facility] OR;  Service: General;  Laterality: N/A;   DENTAL SURGERY  2021   RADIOLOGY WITH ANESTHESIA Left 12/11/2017   Procedure: MRI cervical spine and left shoulder WITH ANESTHESIA;  Surgeon: Radiologist, Medication, MD;  Location: [facility] OR;  Service: Radiology;  Laterality: Left;   SHOULDER ARTHROSCOPY     right shoulder    Family History family history includes Diabetes in her father; Heart Problems in her father; Heart disease in an other family member; Hypertension in her mother and another  family member; Kidney disease in her brother; Stroke in her sister.  Social History Social History   Socioeconomic History   Marital status: Single  Spouse name: Not on file   Number of children: 5   Years of education: 3 th   Highest education level: Not on file  Occupational History   Occupation: unemployed  Tobacco Use   Smoking status: Former    Current packs/day: 0.50    Average packs/day: 0.5 packs/day for 15.0 years (7.5 ttl pk-yrs)    Types: Cigarettes   Smokeless tobacco: Never   Tobacco comments:    less than half pack a day.  Vaping Use   Vaping status: Never Used  Substance and Sexual Activity   Alcohol use: No   Drug use: No   Sexual activity: Not Currently  Other Topics Concern   Not on file  Social History Narrative   Lives at home with children (2 biological, 3 adopted).   Right handed.    Caffeine : three cups daily.   Social Drivers of Corporate Investment Banker Strain: High Risk (03/21/2018)   Overall Financial Resource Strain (CARDIA)    Difficulty of Paying Living Expenses: Very hard  Food Insecurity: No Food Insecurity (10/22/2022)   Hunger Vital Sign    Worried About Running Out of Food in the Last Year: Never true    Ran Out of Food in the Last Year: Never true  Transportation Needs: No Transportation Needs (10/22/2022)   PRAPARE - Administrator, Civil Service (Medical): No    Lack of Transportation (Non-Medical): No  Physical Activity: Inactive (03/21/2018)   Exercise Vital Sign    Days of Exercise per Week: 0 days    Minutes of Exercise per Session: 0 min  Stress: Stress Concern Present (03/21/2018)   [employer] of Occupational Health - Occupational Stress Questionnaire    Feeling of Stress : Very much  Social Connections: Moderately Isolated (03/21/2018)   Social Connection and Isolation Panel    Frequency of Communication with Friends and Family: More than three times a week    Frequency of Social Gatherings with  Friends and Family: Never    Attends Religious Services: Never    Database Administrator or Organizations: No    Attends Banker Meetings: Never    Marital Status: Never married  Intimate Partner Violence: Not At Risk (10/22/2022)   Humiliation, Afraid, Rape, and Kick questionnaire    Fear of Current or Ex-Partner: No    Emotionally Abused: No    Physically Abused: No    Sexually Abused: No    Lab Results  Component Value Date   HGBA1C 8.9 (A) 01/29/2024   HGBA1C 7.7 (H) 05/09/2023   HGBA1C 7.4 (A) 04/19/2023   Lab Results  Component Value Date   CHOL 137 05/09/2023   Lab Results  Component Value Date   HDL 42 (L) 05/09/2023   Lab Results  Component Value Date   LDLCALC 77 05/09/2023   Lab Results  Component Value Date   TRIG 94 05/09/2023   Lab Results  Component Value Date   CHOLHDL 3.3 05/09/2023   Lab Results  Component Value Date   CREATININE 0.86 05/09/2023   No results found for: GFR Lab Results  Component Value Date   MICROALBUR 0.2 05/09/2023      Component Value Date/Time   NA 139 05/09/2023 0812   NA 142 01/28/2018 1017   K 4.0 05/09/2023 0812   CL 106 05/09/2023 0812   CO2 25 05/09/2023 0812   GLUCOSE 201 (H) 05/09/2023 0812   BUN 7 05/09/2023 0812   BUN 5 (  L) 01/28/2018 1017   CREATININE 0.86 05/09/2023 0812   CALCIUM  9.3 05/09/2023 0812   PROT 6.6 05/09/2023 0812   PROT 7.3 01/28/2018 1017   ALBUMIN 3.8 11/19/2022 1554   ALBUMIN 4.3 01/28/2018 1017   AST 13 05/09/2023 0812   ALT 8 05/09/2023 0812   ALKPHOS 68 11/19/2022 1554   BILITOT 0.4 05/09/2023 0812   BILITOT 0.3 01/28/2018 1017   GFRNONAA >60 12/08/2022 1150   GFRNONAA >89 07/25/2016 1126   GFRAA 103 01/28/2018 1017   GFRAA >89 07/25/2016 1126      Latest Ref Rng & Units 05/09/2023    8:12 AM 12/08/2022   11:50 AM 11/19/2022    3:54 PM  BMP  Glucose 65 - 99 mg/dL 798  609  836   BUN 7 - 25 mg/dL 7  <5  25   Creatinine 0.50 - 1.03 mg/dL 9.13  9.27  8.80    BUN/Creat Ratio 6 - 22 (calc) SEE NOTE:     Sodium 135 - 146 mmol/L 139  134  135   Potassium 3.5 - 5.3 mmol/L 4.0  3.2  3.2   Chloride 98 - 110 mmol/L 106  102  100   CO2 20 - 32 mmol/L 25  23  21    Calcium  8.6 - 10.4 mg/dL 9.3  7.8  9.1        Component Value Date/Time   WBC 10.2 12/08/2022 1150   RBC 4.10 12/08/2022 1150   HGB 11.8 (L) 12/08/2022 1150   HGB 13.6 01/28/2018 1017   HCT 35.8 (L) 12/08/2022 1150   HCT 40.5 01/28/2018 1017   PLT 258 12/08/2022 1150   PLT 280 01/28/2018 1017   MCV 87.3 12/08/2022 1150   MCV 88 01/28/2018 1017   MCH 28.8 12/08/2022 1150   MCHC 33.0 12/08/2022 1150   RDW 13.7 12/08/2022 1150   RDW 13.7 01/28/2018 1017   LYMPHSABS 6.1 (H) 11/19/2022 1554   LYMPHSABS 3.3 (H) 01/28/2018 1017   MONOABS 1.5 (H) 11/19/2022 1554   EOSABS 0.1 11/19/2022 1554   EOSABS 0.1 01/28/2018 1017   BASOSABS 0.1 11/19/2022 1554   BASOSABS 0.0 01/28/2018 1017     Parts of this note may have been dictated using voice recognition software. There may be variances in spelling and vocabulary which are unintentional. Not all errors are proofread. Please notify the dino if any discrepancies are noted or if the meaning of any statement is not clear.

## 2024-03-14 ENCOUNTER — Encounter: Payer: Self-pay | Admitting: "Endocrinology

## 2024-04-04 ENCOUNTER — Ambulatory Visit
Admission: RE | Admit: 2024-04-04 | Discharge: 2024-04-04 | Disposition: A | Discharge status: Home / Self Care | Payer: MEDICAID | Source: Ambulatory Visit | Attending: Nurse Practitioner | Admitting: Nurse Practitioner

## 2024-04-04 DIAGNOSIS — N6489 Other specified disorders of breast: Secondary | ICD-10-CM

## 2024-04-23 ENCOUNTER — Ambulatory Visit (INDEPENDENT_AMBULATORY_CARE_PROVIDER_SITE_OTHER): Payer: MEDICAID | Admitting: "Endocrinology

## 2024-04-23 ENCOUNTER — Encounter: Payer: Self-pay | Admitting: "Endocrinology

## 2024-04-23 VITALS — BP 120/80 | HR 95 | Ht 66.0 in | Wt 215.0 lb

## 2024-04-23 DIAGNOSIS — Z7985 Long-term (current) use of injectable non-insulin antidiabetic drugs: Secondary | ICD-10-CM | POA: Diagnosis not present

## 2024-04-23 DIAGNOSIS — Z7984 Long term (current) use of oral hypoglycemic drugs: Secondary | ICD-10-CM | POA: Diagnosis not present

## 2024-04-23 DIAGNOSIS — Z794 Long term (current) use of insulin: Secondary | ICD-10-CM | POA: Diagnosis not present

## 2024-04-23 DIAGNOSIS — E1165 Type 2 diabetes mellitus with hyperglycemia: Secondary | ICD-10-CM | POA: Diagnosis not present

## 2024-04-23 DIAGNOSIS — E7800 Pure hypercholesterolemia, unspecified: Secondary | ICD-10-CM

## 2024-04-23 LAB — POCT GLYCOSYLATED HEMOGLOBIN (HGB A1C): Hemoglobin A1C: 6.5 % — AB (ref 4.0–5.6)

## 2024-04-23 NOTE — Patient Instructions (Signed)

## 2024-04-23 NOTE — Progress Notes (Signed)
 "   Outpatient Endocrinology Note Obadiah Birmingham, MD  04/23/24   Catherine Pope [facility] [birth year] [id number]  Referring Provider: Leron Millman, NP Primary Care Provider: Leron Millman, NP Reason for consultation: Subjective   Assessment & Plan  Diagnoses and all orders for this visit:  Uncontrolled type 2 diabetes mellitus with hyperglycemia, without long-term current use of insulin  (HCC) -     POCT glycosylated hemoglobin (Hb A1C)  Long term (current) use of oral hypoglycemic drugs  Long-term (current) use of injectable non-insulin  antidiabetic drugs  Long-term insulin  use (HCC)  Insulin  dose changed (HCC)  Pure hypercholesterolemia   Diabetes Type II complicated by hyperglycemia, No results found for: GFR Hba1c goal less than 7, current Hba1c is  Lab Results  Component Value Date   HGBA1C 6.5 (A) 04/23/2024   Will recommend the following: Mounjaro 15 mg/week-lost 62 lbs, upto 215 lbs, now gaining back Metformin  XR 500 mg 2 pills a day to avoid diarrhea  04/23/24: Stop Lantus  14 units every day  Glipizide  2.5 mg once a day as needed for blood sugar >200   Ordered dexcom sensor previously but patient did not get it  No history of MEN syndrome/medullary thyroid  cancer/pancreatitis or pancreatic cancer in self or family No known contraindications/side effects to any of above medications  -Last LD and Tg are as follows: Lab Results  Component Value Date   LDLCALC 77 05/09/2023    Lab Results  Component Value Date   TRIG 94 05/09/2023   -On rosuvastatin  10 mg QD -Follow low fat diet and exercise   -Blood pressure goal <140/90 - Microalbumin/creatinine goal is < 30 -Last MA/Cr is as follows: Lab Results  Component Value Date   MICROALBUR 0.2 05/09/2023   -not on ACE/ARB  -diet changes including salt restriction -limit eating outside -counseled BP targets per standards of diabetes care -uncontrolled blood pressure can lead to retinopathy, nephropathy  and cardiovascular and atherosclerotic heart disease  Reviewed and counseled on: -A1C target -Blood sugar targets -Complications of uncontrolled diabetes  -Checking blood sugar before meals and bedtime and bring log next visit -All medications with mechanism of action and side effects -Hypoglycemia management: rule of 15's, Glucagon Emergency Kit and medical alert ID -low-carb low-fat plate-method diet -At least 20 minutes of physical activity per day -Annual dilated retinal eye exam and foot exam -compliance and follow up needs -follow up as scheduled or earlier if problem gets worse  Call if blood sugar is less than 70 or consistently above 250    Take a 15 gm snack of carbohydrate at bedtime before you go to sleep if your blood sugar is less than 100.    If you are going to fast after midnight for a test or procedure, ask your physician for instructions on how to reduce/decrease your insulin  dose.    Call if blood sugar is less than 70 or consistently above 250  -Treating a low sugar by rule of 15  (15 gms of sugar every 15 min until sugar is more than 70) If you feel your sugar is low, test your sugar to be sure If your sugar is low (less than 70), then take 15 grams of a fast acting Carbohydrate (3-4 glucose tablets or glucose gel or 4 ounces of juice or regular soda) Recheck your sugar 15 min after treating low to make sure it is more than 70 If sugar is still less than 70, treat again with 15 grams of carbohydrate  Don't drive the hour of hypoglycemia  If unconscious/unable to eat or drink by mouth, use glucagon injection or nasal spray baqsimi and call 911. Can repeat again in 15 min if still unconscious.  Return in about 3 months (around 07/22/2024) for visit and 8 am labs before next visit.   I have reviewed current medications, nurse's notes, allergies, vital signs, past medical and surgical history, family medical history, and social history for this encounter.  Counseled patient on symptoms, examination findings, lab findings, imaging results, treatment decisions and monitoring and prognosis. The patient understood the recommendations and agrees with the treatment plan. All questions regarding treatment plan were fully answered.  Obadiah Birmingham, MD  04/23/24    History of Present Illness Catherine Pope is a 59 y.o. year old female who presents for follow up of Type II diabetes mellitus.  Catherine Pope was first diagnosed around 2013.   Diabetes education +  Home diabetes regimen: Mounjaro 15 mg/week-lost 62 lbs, upto 215 lbs, now gaining back Metformin  XR 500 mg 2 pills bid Lantus  15 units every day-takes twice a week Nott taking glipizide   COMPLICATIONS -  MI/Stroke -  retinopathy -  neuropathy -  nephropathy  CGM interpretation: At today's visit, we reviewed her CGM downloads. The full report is scanned in the media. Reviewing the CGM trends, BG are well controlled across the day, but patient reports overnight lows, lowest to 68.   Physical Exam  BP 120/80   Pulse 95   Ht 5' 6 (1.676 m)   Wt 215 lb (97.5 kg)   SpO2 99%   BMI 34.70 kg/m    Constitutional: well developed, well nourished Head: normocephalic, atraumatic Eyes: sclera anicteric, no redness Neck: supple Lungs: normal respiratory effort Neurology: alert and oriented Skin: dry, no appreciable rashes Musculoskeletal: no appreciable defects Psychiatric: normal mood and affect Diabetic Foot Exam - Simple   No data filed      Current Medications Patient's Medications  New Prescriptions   No medications on file  Previous Medications   BUPROPION  (WELLBUTRIN  XL) 300 MG 24 HR TABLET    Take 300 mg by mouth daily.   CETIRIZINE  (ZYRTEC ) 10 MG TABLET    Take 10 mg by mouth daily as needed for allergies.   CONTINUOUS GLUCOSE RECEIVER (DEXCOM G7 RECEIVER) DEVI    Use to monitor glucose continuously   CONTINUOUS GLUCOSE SENSOR (DEXCOM G7 SENSOR) MISC    1  Device by Does not apply route continuous.   CONTINUOUS GLUCOSE SENSOR (DEXCOM G7 SENSOR) MISC    Change sensor every 10 days   FAMOTIDINE  (PEPCID ) 40 MG TABLET    Take 40 mg by mouth 2 (two) times daily.   FLUTICASONE  (FLONASE ) 50 MCG/ACT NASAL SPRAY    Place 2 sprays into both nostrils daily as needed for allergies or rhinitis.   GLIPIZIDE  2.5 MG TABS    Take 2.5 mg by mouth as needed (for blood sugar more than 200).   GLUCOSE BLOOD (BLOOD GLUCOSE TEST STRIPS) STRP    Use up to four times daily as directed. (FOR ICD-10 E10.9, E11.9).   INSULIN  GLARGINE (LANTUS  SOLOSTAR) 100 UNIT/ML SOLOSTAR PEN    Inject 15 Units into the skin daily.   INSULIN  PEN NEEDLE (PEN NEEDLES 3/16) 31G X 5 MM MISC    Lantus  15units daily   LANCETS (ONETOUCH ULTRASOFT) LANCETS    Use as instructed   METFORMIN  (GLUCOPHAGE -XR) 500 MG 24 HR TABLET    Take 2 tablets (1,000  mg total) by mouth 2 (two) times daily.   METOCLOPRAMIDE  (REGLAN ) 10 MG TABLET    Take 1 tablet (10 mg total) by mouth every 6 (six) hours.   OXYCODONE  HCL 10 MG TABS    Take 10 mg by mouth in the morning, at noon, and at bedtime.   ROSUVASTATIN  (CRESTOR ) 10 MG TABLET    Take 1 tablet (10 mg total) by mouth for one week each month.   SPIRONOLACTONE (ALDACTONE) 50 MG TABLET    Take 50 mg by mouth daily.   TIRZEPATIDE (MOUNJARO) 15 MG/0.5ML PEN    Inject 15 mg into the skin once a week. Injection on Fridays   TIZANIDINE  (ZANAFLEX ) 6 MG CAPSULE    Take 6 mg by mouth 3 (three) times daily.   VITAMIN D , ERGOCALCIFEROL , (DRISDOL ) 1.25 MG (50000 UNIT) CAPS CAPSULE    Take 50,000 Units by mouth once a week.   ZOLPIDEM (AMBIEN) 10 MG TABLET    Take 10 mg by mouth at bedtime.  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   per pt tested a month ago/ no results yet   Hypertension    Migraine    Neck pain    Seasonal allergies    Sinusitis     Past Surgical History Past Surgical History:  Procedure Laterality Date   ABDOMINAL HYSTERECTOMY     per pt , in her 8's   ANTERIOR CERVICAL DECOMP/DISCECTOMY FUSION N/A 12/27/2016   Procedure: CERVICAL SIX-SEVEN ANTERIOR CERVICAL DECOMPRESSION/DISCECTOMY FUSION;  Surgeon: Joshua Alm RAMAN, MD;  Location: [facility] OR;  Service: Neurosurgery;  Laterality: N/A;   ANTERIOR CRUCIATE LIGAMENT REPAIR     right knee   BREAST EXCISIONAL BIOPSY Left over 20 years ago   benign   BREAST SURGERY     2 lumps removed at age 78, left breast/ benign   CHOLECYSTECTOMY N/A 12/11/2022   Procedure: LAPAROSCOPIC CHOLECYSTECTOMY;  Surgeon: Ebbie Cough, MD;  Location: [facility] OR;  Service: General;  Laterality: N/A;   DENTAL SURGERY  2021   RADIOLOGY WITH ANESTHESIA Left 12/11/2017   Procedure: MRI cervical spine and left shoulder WITH ANESTHESIA;  Surgeon: Radiologist, Medication, MD;  Location: [facility] OR;  Service: Radiology;  Laterality: Left;   SHOULDER ARTHROSCOPY     right shoulder    Family History family history includes Diabetes in her father; Heart Problems in her father; Heart disease in an other family member; Hypertension in her mother and another family member; Kidney disease in her brother; Stroke in her sister.  Social History Social History   Socioeconomic History   Marital status: Single    Spouse name: Not on file   Number of children: 5   Years of education: 12 th   Highest education level: Not on file  Occupational History   Occupation: unemployed  Tobacco Use   Smoking status: Former    Current packs/day: 0.50    Average packs/day: 0.5 packs/day for 15.0 years (7.5 ttl pk-yrs)    Types: Cigarettes   Smokeless tobacco: Never   Tobacco  comments:    less  than half pack a day.  Vaping Use   Vaping status: Never Used  Substance and Sexual Activity   Alcohol use: No   Drug use: No   Sexual activity: Not Currently  Other Topics Concern   Not on file  Social History Narrative   Lives at home with children (2 biological, 3 adopted).   Right handed.    Caffeine : three cups daily.   Social Drivers of Health   Tobacco Use: Medium Risk (04/23/2024)   Patient History    Smoking Tobacco Use: Former    Smokeless Tobacco Use: Never    Passive Exposure: Not on file  Financial Resource Strain: Not on file  Food Insecurity: No Food Insecurity (10/22/2022)   Hunger Vital Sign    Worried About Running Out of Food in the Last Year: Never true    Ran Out of Food in the Last Year: Never true  Transportation Needs: No Transportation Needs (10/22/2022)   PRAPARE - Administrator, Civil Service (Medical): No    Lack of Transportation (Non-Medical): No  Physical Activity: Not on file  Stress: Not on file  Social Connections: Not on file  Intimate Partner Violence: Not At Risk (10/22/2022)   Humiliation, Afraid, Rape, and Kick questionnaire    Fear of Current or Ex-Partner: No    Emotionally Abused: No    Physically Abused: No    Sexually Abused: No  Depression (PHQ2-9): Low Risk (06/30/2021)   Depression (PHQ2-9)    PHQ-2 Score: 0  Alcohol Screen: Not on file  Housing: Low Risk (10/22/2022)   Housing    Last Housing Risk Score: 0  Utilities: Not At Risk (10/22/2022)   AHC Utilities    Threatened with loss of utilities: No  Health Literacy: Not on file    Lab Results  Component Value Date   HGBA1C 6.5 (A) 04/23/2024   HGBA1C 8.9 (A) 01/29/2024   HGBA1C 7.7 (H) 05/09/2023   Lab Results  Component Value Date   CHOL 137 05/09/2023   Lab Results  Component Value Date   HDL 42 (L) 05/09/2023   Lab Results  Component Value Date   LDLCALC 77 05/09/2023   Lab Results  Component Value Date   TRIG 94  05/09/2023   Lab Results  Component Value Date   CHOLHDL 3.3 05/09/2023   Lab Results  Component Value Date   CREATININE 0.86 05/09/2023   No results found for: GFR Lab Results  Component Value Date   MICROALBUR 0.2 05/09/2023      Component Value Date/Time   NA 139 05/09/2023 0812   NA 142 01/28/2018 1017   K 4.0 05/09/2023 0812   CL 106 05/09/2023 0812   CO2 25 05/09/2023 0812   GLUCOSE 201 (H) 05/09/2023 0812   BUN 7 05/09/2023 0812   BUN 5 (L) 01/28/2018 1017   CREATININE 0.86 05/09/2023 0812   CALCIUM  9.3 05/09/2023 0812   PROT 6.6 05/09/2023 0812   PROT 7.3 01/28/2018 1017   ALBUMIN 3.8 11/19/2022 1554   ALBUMIN 4.3 01/28/2018 1017   AST 13 05/09/2023 0812   ALT 8 05/09/2023 0812   ALKPHOS 68 11/19/2022 1554   BILITOT 0.4 05/09/2023 0812   BILITOT 0.3 01/28/2018 1017   GFRNONAA >60 12/08/2022 1150   GFRNONAA >89 07/25/2016 1126   GFRAA 103 01/28/2018 1017   GFRAA >89 07/25/2016 1126      Latest Ref Rng & Units 05/09/2023    8:12 AM 12/08/2022   11:50  AM 11/19/2022    3:54 PM  BMP  Glucose 65 - 99 mg/dL 798  609  836   BUN 7 - 25 mg/dL 7  <5  25   Creatinine 0.50 - 1.03 mg/dL 9.13  9.27  8.80   BUN/Creat Ratio 6 - 22 (calc) SEE NOTE:     Sodium 135 - 146 mmol/L 139  134  135   Potassium 3.5 - 5.3 mmol/L 4.0  3.2  3.2   Chloride 98 - 110 mmol/L 106  102  100   CO2 20 - 32 mmol/L 25  23  21    Calcium  8.6 - 10.4 mg/dL 9.3  7.8  9.1        Component Value Date/Time   WBC 10.2 12/08/2022 1150   RBC 4.10 12/08/2022 1150   HGB 11.8 (L) 12/08/2022 1150   HGB 13.6 01/28/2018 1017   HCT 35.8 (L) 12/08/2022 1150   HCT 40.5 01/28/2018 1017   PLT 258 12/08/2022 1150   PLT 280 01/28/2018 1017   MCV 87.3 12/08/2022 1150   MCV 88 01/28/2018 1017   MCH 28.8 12/08/2022 1150   MCHC 33.0 12/08/2022 1150   RDW 13.7 12/08/2022 1150   RDW 13.7 01/28/2018 1017   LYMPHSABS 6.1 (H) 11/19/2022 1554   LYMPHSABS 3.3 (H) 01/28/2018 1017   MONOABS 1.5 (H) 11/19/2022 1554    EOSABS 0.1 11/19/2022 1554   EOSABS 0.1 01/28/2018 1017   BASOSABS 0.1 11/19/2022 1554   BASOSABS 0.0 01/28/2018 1017     Parts of this note may have been dictated using voice recognition software. There may be variances in spelling and vocabulary which are unintentional. Not all errors are proofread. Please notify the dino if any discrepancies are noted or if the meaning of any statement is not clear.   "

## 2024-04-25 ENCOUNTER — Encounter: Payer: Self-pay | Admitting: "Endocrinology

## 2024-05-01 ENCOUNTER — Other Ambulatory Visit: Payer: Self-pay

## 2024-05-01 ENCOUNTER — Other Ambulatory Visit: Payer: Self-pay | Admitting: "Endocrinology

## 2024-05-06 ENCOUNTER — Other Ambulatory Visit: Payer: Self-pay

## 2024-07-17 ENCOUNTER — Other Ambulatory Visit: Payer: MEDICAID

## 2024-07-22 ENCOUNTER — Ambulatory Visit: Payer: MEDICAID | Admitting: "Endocrinology
